# Patient Record
Sex: Female | Born: 1970 | Race: Black or African American | Hispanic: No | Marital: Single | Smoking: Current every day smoker
Health system: Southern US, Community
[De-identification: ages and names within clinical notes are randomized; demographics above are authoritative.]

## PROBLEM LIST (undated history)

## (undated) DIAGNOSIS — F419 Anxiety disorder, unspecified: Secondary | ICD-10-CM

## (undated) DIAGNOSIS — J45909 Unspecified asthma, uncomplicated: Secondary | ICD-10-CM

## (undated) DIAGNOSIS — G510 Bell's palsy: Secondary | ICD-10-CM

## (undated) DIAGNOSIS — F32A Depression, unspecified: Secondary | ICD-10-CM

## (undated) DIAGNOSIS — D573 Sickle-cell trait: Secondary | ICD-10-CM

## (undated) DIAGNOSIS — M1990 Unspecified osteoarthritis, unspecified site: Secondary | ICD-10-CM

## (undated) DIAGNOSIS — G4730 Sleep apnea, unspecified: Secondary | ICD-10-CM

## (undated) DIAGNOSIS — M87029 Idiopathic aseptic necrosis of unspecified humerus: Secondary | ICD-10-CM

## (undated) DIAGNOSIS — F329 Major depressive disorder, single episode, unspecified: Secondary | ICD-10-CM

## (undated) HISTORY — PX: TUBAL LIGATION: SHX77

## (undated) HISTORY — DX: Sleep apnea, unspecified: G47.30

## (undated) HISTORY — PX: OTHER SURGICAL HISTORY: SHX169

---
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
## 2005-12-09 HISTORY — PX: BREAST EXCISIONAL BIOPSY: SUR124

## 2010-09-12 DIAGNOSIS — S82209A Unspecified fracture of shaft of unspecified tibia, initial encounter for closed fracture: Secondary | ICD-10-CM | POA: Insufficient documentation

## 2010-09-18 DIAGNOSIS — S82899A Other fracture of unspecified lower leg, initial encounter for closed fracture: Secondary | ICD-10-CM | POA: Insufficient documentation

## 2012-12-09 HISTORY — PX: BREAST BIOPSY: SHX20

## 2013-08-25 ENCOUNTER — Encounter (HOSPITAL_COMMUNITY): Payer: Self-pay | Admitting: Emergency Medicine

## 2013-08-25 ENCOUNTER — Emergency Department (HOSPITAL_COMMUNITY): Payer: Medicaid Other

## 2013-08-25 ENCOUNTER — Inpatient Hospital Stay (HOSPITAL_COMMUNITY)
Admission: EM | Admit: 2013-08-25 | Discharge: 2013-08-27 | DRG: 313 | Disposition: A | Discharge status: Home / Self Care | Payer: Medicaid Other | Attending: Internal Medicine | Admitting: Internal Medicine

## 2013-08-25 DIAGNOSIS — D649 Anemia, unspecified: Secondary | ICD-10-CM | POA: Diagnosis present

## 2013-08-25 DIAGNOSIS — R942 Abnormal results of pulmonary function studies: Secondary | ICD-10-CM | POA: Diagnosis present

## 2013-08-25 DIAGNOSIS — Z8249 Family history of ischemic heart disease and other diseases of the circulatory system: Secondary | ICD-10-CM

## 2013-08-25 DIAGNOSIS — R9389 Abnormal findings on diagnostic imaging of other specified body structures: Secondary | ICD-10-CM | POA: Diagnosis present

## 2013-08-25 DIAGNOSIS — R079 Chest pain, unspecified: Secondary | ICD-10-CM | POA: Diagnosis present

## 2013-08-25 DIAGNOSIS — Z7982 Long term (current) use of aspirin: Secondary | ICD-10-CM

## 2013-08-25 DIAGNOSIS — R0789 Other chest pain: Principal | ICD-10-CM | POA: Diagnosis present

## 2013-08-25 DIAGNOSIS — D696 Thrombocytopenia, unspecified: Secondary | ICD-10-CM | POA: Diagnosis present

## 2013-08-25 DIAGNOSIS — F411 Generalized anxiety disorder: Secondary | ICD-10-CM | POA: Diagnosis present

## 2013-08-25 DIAGNOSIS — F32A Depression, unspecified: Secondary | ICD-10-CM | POA: Diagnosis present

## 2013-08-25 DIAGNOSIS — M79609 Pain in unspecified limb: Secondary | ICD-10-CM | POA: Diagnosis present

## 2013-08-25 DIAGNOSIS — F3289 Other specified depressive episodes: Secondary | ICD-10-CM | POA: Diagnosis present

## 2013-08-25 DIAGNOSIS — I517 Cardiomegaly: Secondary | ICD-10-CM | POA: Diagnosis present

## 2013-08-25 DIAGNOSIS — F17200 Nicotine dependence, unspecified, uncomplicated: Secondary | ICD-10-CM | POA: Diagnosis present

## 2013-08-25 DIAGNOSIS — E876 Hypokalemia: Secondary | ICD-10-CM | POA: Diagnosis present

## 2013-08-25 DIAGNOSIS — F329 Major depressive disorder, single episode, unspecified: Secondary | ICD-10-CM

## 2013-08-25 DIAGNOSIS — M79661 Pain in right lower leg: Secondary | ICD-10-CM

## 2013-08-25 DIAGNOSIS — D573 Sickle-cell trait: Secondary | ICD-10-CM | POA: Diagnosis present

## 2013-08-25 HISTORY — DX: Anxiety disorder, unspecified: F41.9

## 2013-08-25 HISTORY — DX: Major depressive disorder, single episode, unspecified: F32.9

## 2013-08-25 HISTORY — DX: Depression, unspecified: F32.A

## 2013-08-25 LAB — POCT I-STAT TROPONIN I: Troponin i, poc: 0 ng/mL (ref 0.00–0.08)

## 2013-08-25 MED ORDER — MORPHINE SULFATE 4 MG/ML IJ SOLN
4.0000 mg | Freq: Once | INTRAMUSCULAR | Status: AC
  Administered 2013-08-25: 4 mg via INTRAVENOUS
  Filled 2013-08-25: qty 1

## 2013-08-25 MED ORDER — IOHEXOL 350 MG/ML SOLN
100.0000 mL | Freq: Once | INTRAVENOUS | Status: AC | PRN
  Administered 2013-08-25: 100 mL via INTRAVENOUS

## 2013-08-25 NOTE — ED Notes (Signed)
Pt states she has sharp aching pain in her left chest that she describes originating in her left breasts.

## 2013-08-25 NOTE — ED Notes (Signed)
Per EMS pt states she is experiencing chest pain since 1500 this afternoon, pt denies any other sx except for chest pain. Pt states the chest pain has progressively increased since it first started this afternoon, EMS states pt was in NSR. EMS administered 0.4 mg of nitroglycerin, pt states her pain decreased from a 10 to an 8 with the one nitroglycerin tablet, per EMS they did not give any more nitroglycerin because the pt's BP dropped to 100/70. Per EMS pt took 325mg  of aspirin at home. Per EMS pt denies SOB, N/V, back pain, sweating.

## 2013-08-25 NOTE — ED Provider Notes (Signed)
CSN: [id number]     Arrival date & time 08/25/13  2237 History   First MD Initiated Contact with Patient 08/25/13 2300     Chief Complaint  Patient presents with  . Chest Pain   (Consider location/radiation/quality/duration/timing/severity/associated sxs/prior Treatment) Patient is a 42 y.o. female presenting with chest pain. The history is provided by the patient.  Chest Pain She had onset about 2:30 PM of pain in her midsternal area and left side of her chest. Pain was dull and achy and became a pressure feeling without radiation. There is associated dyspnea but no nausea or diaphoresis. Pain became severe and she rated it at 10/10 at its worst. She was brought in by EMS who gave her a nitroglycerin which reduced the pain to 8/10 but dropped her blood pressure. Pain is not worse with deep breath but it is worse with movement of her left arm and worse if she lays down-especially if she lays on her left side but denies any recent trauma. Denies any cough or fever. She has been having pain in her right calf for the last 4 days. She saw her physician was evaluating her for possible blood clot. She had blood work done but she does not know the results of the time. She's not had any recent long distance travel, no recent surgery. She's not on birth control pills. She does smoke. There is family history of premature coronary artery disease. She has no history of diabetes, hypertension, hyperlipidemia. She does have sickle cell trait.   Past Medical History  Diagnosis Date  . Depression   . Anxiety    Past Surgical History  Procedure Laterality Date  . Right ankle surgery      2010  . Tubal ligation    . Benign breast tumor removed      2006   No family history on file. History  Substance Use Topics  . Smoking status: Current Every Day Smoker -- 0.50 packs/day    Types: Cigarettes  . Smokeless tobacco: Not on file  . Alcohol Use: No   OB History   Grav Para Term Preterm Abortions TAB SAB  Ect Mult Living                 Review of Systems  Cardiovascular: Positive for chest pain.  All other systems reviewed and are negative.    Allergies  Review of patient's allergies indicates no known allergies.  Home Medications   Current Outpatient Rx  Name  Route  Sig  Dispense  Refill  . aspirin EC 81 MG tablet   Oral   Take 81 mg by mouth daily.         . diphenhydrAMINE (BENADRYL) 25 MG tablet   Oral   Take 25 mg by mouth every 8 (eight) hours as needed for sleep.         . Pseudoeph-Doxylamine-DM-APAP (NYQUIL PO)   Oral   Take 15 mLs by mouth at bedtime as needed (for sleep).          BP 110/75  Pulse 90  Temp(Src) 97.7 F (36.5 C) (Oral)  Resp 21  SpO2 99% Physical Exam  Nursing note and vitals reviewed.  42 year old female, resting comfortably and in no acute distress. Vital signs are significant for mild tachypnea with respiratory rate of 21 . Oxygen saturation is 99%, which is normal. Head is normocephalic and atraumatic. PERRLA, EOMI. Oropharynx is clear. Neck is nontender and supple without adenopathy or JVD. Back  is nontender and there is no CVA tenderness. Lungs are clear without rales, wheezes, or rhonchi. Chest is  mildly to moderately tender in the left parasternal area . Heart has regular rate and rhythm without murmur. Abdomen is soft, flat, nontender without masses or hepatosplenomegaly and peristalsis is normoactive. Extremities have no cyanosis or edema, full range of motion is present. there is marked tenderness of the right calf but no calf swelling. Calf circumference is symmetric. There are no palpable cords and no warmth.  Skin is warm and dry without rash. Neurologic: Mental status is normal, cranial nerves are intact, there are no motor or sensory deficits.  ED Course  Procedures (including critical care time) Labs Review Results for orders placed during the hospital encounter of 08/25/13  CBC      Result Value Range   WBC  10.5  4.0 - 10.5 K/uL   RBC 3.82 (*) 3.87 - 5.11 MIL/uL   Hemoglobin 11.0 (*) 12.0 - 15.0 g/dL   HCT 13.2 (*) 44.0 - 10.2 %   MCV 78.0  78.0 - 100.0 fL   MCH 28.8  26.0 - 34.0 pg   MCHC 36.9 (*) 30.0 - 36.0 g/dL   RDW 72.5  36.6 - 44.0 %   Platelets 103 (*) 150 - 400 K/uL  BASIC METABOLIC PANEL      Result Value Range   Sodium 138  135 - 145 mEq/L   Potassium 3.8  3.5 - 5.1 mEq/L   Chloride 104  96 - 112 mEq/L   CO2 27  19 - 32 mEq/L   Glucose, Bld 94  70 - 99 mg/dL   BUN 8  6 - 23 mg/dL   Creatinine, Ser 3.47  0.50 - 1.10 mg/dL   Calcium 8.4  8.4 - 42.5 mg/dL   GFR calc non Af Amer >90  >90 mL/min   GFR calc Af Amer >90  >90 mL/min  POCT I-STAT TROPONIN I      Result Value Range   Troponin i, poc 0.00  0.00 - 0.08 ng/mL   Comment 3            Imaging Review Ct Angio Chest Pe W/cm &/or Wo Cm  08/26/2013   CLINICAL DATA:  Left-sided chest pain.  EXAM: CT ANGIOGRAPHY CHEST WITH CONTRAST  TECHNIQUE: Multidetector CT imaging of the chest was performed using the standard protocol during bolus administration of intravenous contrast. Multiplanar CT image reconstructions including MIPs were obtained to evaluate the vascular anatomy.  CONTRAST:  OMNIPAQUE IOHEXOL 350 MG/ML SOLN  COMPARISON:  No priors.  FINDINGS: Mediastinum: There are no filling defects within the pulmonary arterial tree to suggest underlying pulmonary embolism. Heart size is borderline enlarged. There is no significant pericardial fluid, thickening or pericardial calcification. No pathologically enlarged mediastinal lymph nodes. Multiple borderline enlarged and mildly enlarged bilateral hilar lymph nodes are noted, largest of which measure 1 cm on the right and 1.1 cm on the left in short axis. Esophagus is unremarkable in appearance.  Lungs/Pleura: Diffuse ground-glass attenuation throughout the lower lobes of the lungs bilaterally is nonspecific, but can be seen in the setting of alveolar hemorrhage or developing  infection. There are linear areas of architectural distortion in the lower lobes of the lungs bilaterally, which may represent some post infectious or inflammatory scarring. Multiple small thin-walled cysts are noted scattered throughout the lungs bilaterally, which are nonspecific. Trace left pleural effusion layering dependently. No definite suspicious appearing pulmonary nodule or masses identified at  this time.  Upper Abdomen: Numerous calcified gallstones within the gallbladder. Gallbladder is incompletely visualized.  Musculoskeletal: There are no aggressive appearing lytic or blastic lesions noted in the visualized portions of the skeleton.  Review of the MIP images confirms the above findings.  IMPRESSION: 1. No evidence of pulmonary embolism. 2. Unusual appearance of the lower lobes of the lungs bilaterally where there is mild diffuse ground-glass attenuation. This can be seen in the setting of alveolar hemorrhage (only likely if the patient has a recent history of hemoptysis) or developing infection. In addition, there is a trace left pleural effusion and there are areas of scarring in the lung bases bilaterally. Notably, there is also some borderline enlarged and minimally enlarged bilateral hilar lymph nodes which are presumably reactive. 3. Mild cardiomegaly.   Electronically Signed   By: Trudie Reed M.D.   On: 08/26/2013 00:43   Images viewed by me.   Date: 08/25/2013  Rate: 79  Rhythm: normal sinus rhythm  QRS Axis: normal  Intervals: normal  ST/T Wave abnormalities: normal  Conduction Disutrbances:none  Narrative Interpretation: Normal ECG. No prior ECG available for comparison  Old EKG Reviewed: unchanged   dMDM   1. Chest pain   2. Calf pain, right    Chest pain of uncertain cause. Certainly with recent calf pain that he need to consider possibility of pulmonary embolism. She had traveled down here from [place] via train but that was 4 months ago. ECG does not show any  acute changes but she will need serial cardiac markers.  CT scan is reported to show groundglass densities worrisome for infection but she has no clinical signs of infection. There is no evidence of pulmonary embolism. She got complete relief of pain with morphine but had recurrence of pain and is given a second dose of morphine. She will need to be admitted for serial troponins and also to get venous Doppler of her right leg. Case is discussed with Dr. Toniann Fail of [facility] who agrees to admit the patient.  Dione Booze, MD 08/26/13 [phone]

## 2013-08-26 ENCOUNTER — Encounter (HOSPITAL_COMMUNITY): Payer: Self-pay | Admitting: Internal Medicine

## 2013-08-26 DIAGNOSIS — R079 Chest pain, unspecified: Secondary | ICD-10-CM | POA: Diagnosis present

## 2013-08-26 DIAGNOSIS — M79609 Pain in unspecified limb: Secondary | ICD-10-CM

## 2013-08-26 DIAGNOSIS — E876 Hypokalemia: Secondary | ICD-10-CM | POA: Diagnosis present

## 2013-08-26 DIAGNOSIS — I517 Cardiomegaly: Secondary | ICD-10-CM

## 2013-08-26 DIAGNOSIS — R072 Precordial pain: Secondary | ICD-10-CM

## 2013-08-26 DIAGNOSIS — F329 Major depressive disorder, single episode, unspecified: Secondary | ICD-10-CM | POA: Diagnosis present

## 2013-08-26 DIAGNOSIS — F32A Depression, unspecified: Secondary | ICD-10-CM | POA: Diagnosis present

## 2013-08-26 DIAGNOSIS — R9389 Abnormal findings on diagnostic imaging of other specified body structures: Secondary | ICD-10-CM | POA: Diagnosis present

## 2013-08-26 DIAGNOSIS — D649 Anemia, unspecified: Secondary | ICD-10-CM

## 2013-08-26 DIAGNOSIS — D696 Thrombocytopenia, unspecified: Secondary | ICD-10-CM | POA: Diagnosis present

## 2013-08-26 DIAGNOSIS — D573 Sickle-cell trait: Secondary | ICD-10-CM

## 2013-08-26 LAB — IRON AND TIBC: UIBC: 162 ug/dL (ref 125–400)

## 2013-08-26 LAB — CBC
HCT: 27.8 % — ABNORMAL LOW (ref 36.0–46.0)
Hemoglobin: 10.3 g/dL — ABNORMAL LOW (ref 12.0–15.0)
MCH: 29 pg (ref 26.0–34.0)
MCHC: 36.9 g/dL — ABNORMAL HIGH (ref 30.0–36.0)
MCHC: 37.1 g/dL — ABNORMAL HIGH (ref 30.0–36.0)
Platelets: 103 10*3/uL — ABNORMAL LOW (ref 150–400)
RBC: 3.55 MIL/uL — ABNORMAL LOW (ref 3.87–5.11)
RDW: 15 % (ref 11.5–15.5)
WBC: 10.5 10*3/uL (ref 4.0–10.5)

## 2013-08-26 LAB — T4, FREE: Free T4: 1.21 ng/dL (ref 0.80–1.80)

## 2013-08-26 LAB — BASIC METABOLIC PANEL
BUN: 7 mg/dL (ref 6–23)
BUN: 8 mg/dL (ref 6–23)
Chloride: 102 mEq/L (ref 96–112)
Chloride: 104 mEq/L (ref 96–112)
GFR calc Af Amer: >90 mL/min (ref >90)
GFR calc non Af Amer: >90 mL/min (ref >90)
GFR calc non Af Amer: >90 mL/min (ref >90)
Glucose, Bld: 97 mg/dL (ref 70–99)
Potassium: 3.3 mEq/L — ABNORMAL LOW (ref 3.5–5.1)
Potassium: 3.8 mEq/L (ref 3.5–5.1)
Sodium: 135 mEq/L (ref 135–145)
Sodium: 138 mEq/L (ref 135–145)

## 2013-08-26 LAB — TROPONIN I
Troponin I: <0.3 ng/mL (ref <0.30)
Troponin I: <0.3 ng/mL (ref <0.30)
Troponin I: <0.3 ng/mL (ref <0.30)

## 2013-08-26 LAB — CBC WITH DIFFERENTIAL/PLATELET
Basophils Absolute: 0 10*3/uL (ref 0.0–0.1)
Eosinophils Relative: 1 % (ref 0–5)
Lymphocytes Relative: 10 % — ABNORMAL LOW (ref 12–46)
Neutro Abs: 7.2 10*3/uL (ref 1.7–7.7)
Platelets: 80 10*3/uL — ABNORMAL LOW (ref 150–400)
RDW: 14.9 % (ref 11.5–15.5)
WBC: 8.4 10*3/uL (ref 4.0–10.5)

## 2013-08-26 LAB — HEPATIC FUNCTION PANEL
Alkaline Phosphatase: 66 U/L (ref 39–117)
Indirect Bilirubin: 0.7 mg/dL (ref 0.3–0.9)
Total Bilirubin: 0.8 mg/dL (ref 0.3–1.2)

## 2013-08-26 LAB — SAVE SMEAR

## 2013-08-26 LAB — RETICULOCYTES
RBC.: 3.54 MIL/uL — ABNORMAL LOW (ref 3.87–5.11)
Retic Count, Absolute: 120.4 10*3/uL (ref 19.0–186.0)

## 2013-08-26 LAB — PRO B NATRIURETIC PEPTIDE: Pro B Natriuretic peptide (BNP): 148.6 pg/mL — ABNORMAL HIGH (ref 0–125)

## 2013-08-26 LAB — RAPID URINE DRUG SCREEN, HOSP PERFORMED
Amphetamines: NOT DETECTED
Opiates: NOT DETECTED

## 2013-08-26 LAB — POCT PREGNANCY, URINE: Preg Test, Ur: NEGATIVE

## 2013-08-26 LAB — FERRITIN: Ferritin: 19 ng/mL (ref 10–291)

## 2013-08-26 LAB — PROCALCITONIN: Procalcitonin: <0.1 ng/mL

## 2013-08-26 LAB — MRSA PCR SCREENING: MRSA by PCR: NEGATIVE

## 2013-08-26 LAB — HIV ANTIBODY (ROUTINE TESTING W REFLEX): HIV: NONREACTIVE

## 2013-08-26 MED ORDER — ONDANSETRON HCL 4 MG/2ML IJ SOLN
4.0000 mg | Freq: Four times a day (QID) | INTRAMUSCULAR | Status: DC | PRN
  Administered 2013-08-26: 4 mg via INTRAVENOUS
  Filled 2013-08-26: qty 2

## 2013-08-26 MED ORDER — MORPHINE SULFATE 4 MG/ML IJ SOLN
4.0000 mg | Freq: Once | INTRAMUSCULAR | Status: AC
  Administered 2013-08-26: 4 mg via INTRAVENOUS
  Filled 2013-08-26: qty 1

## 2013-08-26 MED ORDER — ENOXAPARIN SODIUM 100 MG/ML ~~LOC~~ SOLN
90.0000 mg | Freq: Once | SUBCUTANEOUS | Status: AC
  Administered 2013-08-26: 90 mg via SUBCUTANEOUS
  Filled 2013-08-26: qty 1

## 2013-08-26 MED ORDER — ACETAMINOPHEN 650 MG RE SUPP: 650.0000 mg | Freq: Four times a day (QID) | RECTAL | Status: DC | PRN

## 2013-08-26 MED ORDER — SODIUM CHLORIDE 0.9 % IJ SOLN
3.0000 mL | Freq: Two times a day (BID) | INTRAMUSCULAR | Status: DC
  Administered 2013-08-26: 3 mL via INTRAVENOUS

## 2013-08-26 MED ORDER — LEVOFLOXACIN IN D5W 750 MG/150ML IV SOLN
750.0000 mg | INTRAVENOUS | Status: DC
  Administered 2013-08-26: 750 mg via INTRAVENOUS
  Filled 2013-08-26: qty 150

## 2013-08-26 MED ORDER — ASPIRIN EC 325 MG PO TBEC
325.0000 mg | DELAYED_RELEASE_TABLET | Freq: Every day | ORAL | Status: DC
  Filled 2013-08-26: qty 1

## 2013-08-26 MED ORDER — POTASSIUM CHLORIDE CRYS ER 20 MEQ PO TBCR
40.0000 meq | EXTENDED_RELEASE_TABLET | Freq: Once | ORAL | Status: AC
  Administered 2013-08-26: 40 meq via ORAL

## 2013-08-26 MED ORDER — ENOXAPARIN SODIUM 100 MG/ML ~~LOC~~ SOLN
90.0000 mg | Freq: Two times a day (BID) | SUBCUTANEOUS | Status: DC
  Filled 2013-08-26 (×2): qty 1

## 2013-08-26 MED ORDER — ONDANSETRON HCL 4 MG PO TABS: 4.0000 mg | ORAL_TABLET | Freq: Four times a day (QID) | ORAL | Status: DC | PRN

## 2013-08-26 MED ORDER — MORPHINE SULFATE 2 MG/ML IJ SOLN: 1.0000 mg | INTRAMUSCULAR | Status: DC | PRN

## 2013-08-26 MED ORDER — DIPHENHYDRAMINE HCL 50 MG/ML IJ SOLN
25.0000 mg | Freq: Once | INTRAMUSCULAR | Status: AC
  Administered 2013-08-26: 25 mg via INTRAVENOUS
  Filled 2013-08-26: qty 1

## 2013-08-26 MED ORDER — ACETAMINOPHEN 325 MG PO TABS
650.0000 mg | ORAL_TABLET | Freq: Four times a day (QID) | ORAL | Status: DC | PRN
  Administered 2013-08-26: 650 mg via ORAL
  Filled 2013-08-26: qty 2

## 2013-08-26 MED ORDER — HYDROCORTISONE SOD SUCCINATE 100 MG IJ SOLR
100.0000 mg | Freq: Once | INTRAMUSCULAR | Status: AC
  Administered 2013-08-26: 100 mg via INTRAVENOUS
  Filled 2013-08-26: qty 2

## 2013-08-26 NOTE — Progress Notes (Signed)
  Echocardiogram 2D Echocardiogram has been performed.  Tina Mayo 08/26/2013, 4:22 PM

## 2013-08-26 NOTE — ED Notes (Signed)
Pt c/o nausea. Sign and held order released for zofran.

## 2013-08-26 NOTE — ED Notes (Signed)
Admitting MD at bedside.

## 2013-08-26 NOTE — Progress Notes (Signed)
*  Preliminary Results* Bilateral lower extremity venous duplex completed. Bilateral lower extremities are negative for deep vein thrombosis. There is no evidence of Baker's cyst bilaterally.  08/26/2013  Larae Caison, RVT, RDCS, RDMS  

## 2013-08-26 NOTE — Progress Notes (Signed)
[facility] Progress Note [name] TEAM 1 - Stepdown ICU Team   Tina Mayo ZOX:[id number] DOB: [birth year] DOA: 08/25/2013 PCP: No primary provider on file.  Brief narrative: 42 y.o. female history ongoing tobacco abuse and history of depression presently on no medications presents with complaints of chest pain. Patient experienced some chest pain yesterday afternoon which was self-limiting and resolved without any intervention. Later in the evening patient started developing chest pain again. The pain is left-sided stabbing type nonradiating not pleuritic no associated shortness of breath diaphoresis cough fever chills. Since the pain was persistent patient came to the ER. EKG and cardiac markers were negative. CT angiogram of the chest was done which was negative for PE but shows abnormal features, please see the report. Patient's chest pain improved with more morphine but still persistent. Patient has been admitted for further management. Since patient has been having right calf pain since Monday 4 days ago patient was given a dose of Lovenox for possible DVT and Dopplers have been ordered. Patient states that since Monday she has been having some right calf pain for which she had gone to urgent care center and was instructed Dopplers done which patient has not had. Patient otherwise denies any nausea vomiting abdominal pain diarrhea fever chills headache or focal deficits. Patient has recently moved from [place] 2 months ago   Assessment/Plan: Active Problems:   Chest pain -enzymes/EKG negative -ECHO pending -pain worse with movement and appears more muscular    Thrombocytopenia, unspecified / Anemia, unspecified -platelets 103,000 at admit and have trended down since admit - o/w 80,000 -Since prelim duplex negative will dc Lovenox -LDH and haptoglobin not c/w hemolysis -consulted heme-rec check hepatitis panel -smear pending   RLE swelling/tightness -prelim duplex  negative    Cardiomegaly -ECHO pending    Sickle cell trait    Abnormal CT of the chest -Unusual appearance of the lower lobes of the lungs bilaterally  where there is mild diffuse ground-glass attenuation. This can be  seen in the setting of alveolar hemorrhage (only likely if the  patient has a recent history of hemoptysis) or developing infection.  In addition, there is a trace left pleural effusion and there are  areas of scarring in the lung bases bilaterally. Notably, there is  also some borderline enlarged and minimally enlarged bilateral hilar  lymph nodes which are presumably reactive. -not hypoxic and no sx's except for unexplained intermittent diaphoresis since April -no fever or leukocytosis so doubt infection   Depression  -has been off usual meds for > 6 months- once establishes with PCP can eval need then    Hypokalemia -replete and check BMET in am    DVT prophylaxis: Lovenox DC'd since Dopplers negative for DVT in patient with thrombocytopenia-start SCDs Code Status: Full Family Communication: Patient Disposition Plan/Expected LOS: Remain in step down Isolation: None Nutritional Status: Stable  Consultants: Hematology  Procedures: 2-D echocardiogram pending  Lower extremity venous duplex Luminary negative for DVT  Antibiotics: Levaquin 9/17 >>> 9/18  HPI/Subjective: Patient alert and endorses increasing chest discomfort with repositioning to left side. Also reports tenderness over left anterior chest wall. No shortness of breath. Primarily complains of intermittent diaphoresis symptoms throughout the day and evening. Patient attributes this to possible onset of early menopause. States recently moved down here from [place] and has not had complete physical in quite some time including gynecological or mammogram exams  Objective: Blood pressure 110/55, pulse 80, temperature 98.6 F (37 C), temperature source Oral, resp. rate  19, height 5\' 7"   (1.702 m), weight 85 kg (187 lb 6.3 oz), SpO2 99.00%.  Intake/Output Summary (Last 24 hours) at 08/26/13 1508 Last data filed at 08/26/13 0300  Gross per 24 hour  Intake    240 ml  Output      0 ml  Net    240 ml     Exam: General: No acute respiratory distress Lungs: Clear to auscultation bilaterally without wheezes or crackles, RA Cardiovascular: Regular rate and rhythm without murmur gallop or rub normal S1 and S2, no peripheral edema except for mildly swollen right calf, no JVD Abdomen: Nontender, nondistended, soft, bowel sounds positive, no rebound, no ascites, no appreciable mass Musculoskeletal: No significant cyanosis, clubbing of bilateral lower extremities Neurological: Alert and oriented x 3, moves all extremities x 4 without focal neurological deficits, CN 2-12 intact  Scheduled Meds:  Scheduled Meds: . potassium chloride  40 mEq Oral Once  . sodium chloride  3 mL Intravenous Q12H  . sodium chloride  3 mL Intravenous Q12H   Continuous Infusions:   **Reviewed in detail by the Attending Physicia  Data Reviewed: Basic Metabolic Panel:  Recent Labs Lab 08/25/13 2330 08/26/13 0425  NA 138 135  K 3.8 3.3*  CL 104 102  CO2 27 22  GLUCOSE 94 97  BUN 8 7  CREATININE 0.78 0.75  CALCIUM 8.4 8.5   Liver Function Tests:  Recent Labs Lab 08/26/13 0735  AST 13  ALT 16  ALKPHOS 66  BILITOT 0.8  PROT 6.4  ALBUMIN 3.1*   No results found for this basename: LIPASE, AMYLASE,  in the last 168 hours No results found for this basename: AMMONIA,  in the last 168 hours CBC:  Recent Labs Lab 08/25/13 2330 08/26/13 0425 08/26/13 1120  WBC 10.5 9.8 8.4  NEUTROABS  --   --  7.2  HGB 11.0* 10.3* 10.6*  HCT 29.8* 27.8* 29.0*  MCV 78.0 78.3 78.8  PLT 103* 93* 80*   Cardiac Enzymes:  Recent Labs Lab 08/26/13 0425 08/26/13 1120  TROPONINI <0.30 <0.30   BNP (last 3 results)  Recent Labs  08/26/13 0425  PROBNP 148.6*   CBG: No results found for this  basename: GLUCAP,  in the last 168 hours  Recent Results (from the past 240 hour(s))  MRSA PCR SCREENING     Status: None   Collection Time    08/26/13  3:46 AM      Result Value Range Status   MRSA by PCR NEGATIVE  NEGATIVE Final   Comment:            The GeneXpert MRSA Assay (FDA     approved for NASAL specimens     only), is one component of a     comprehensive MRSA colonization     surveillance program. It is not     intended to diagnose MRSA     infection nor to guide or     monitor treatment for     MRSA infections.     Studies:  Recent x-ray studies have been reviewed in detail by the Attending Physician    Junious Silk, ANP [facility] Office  [phone] Pager [phone]  **If unable to reach the above provider after paging please contact the Flow Manager @ [phone]  On-Call/Text Page:      [email or web address]      password [email or web address]  If 7PM-7AM, please contact night-coverage [email or web address] Password [email or web address] 08/26/2013, 3:08 PM   LOS: 1 day  I have examined the patient, reviewed the chart and modified the above note which I agree with.   Tina Rodocker,MD [phone] 08/26/2013, 4:58 PM

## 2013-08-26 NOTE — Consult Note (Signed)
[facility] Telephone:([phone]) [phone]   Fax:([phone]  INPATIENT CONSULT NOTE  REFERRING PHYSICIAN: No referring provider defined for this encounter.  REASON FOR CONSULTATION:  Thrombocytopenia, moderate  HPI Tina Mayo is a 42 y.o. female history ongoing tobacco abuse, bipolar presented with chest pain on day of admission which she described as "tooth ache in my chest". A few days prior to this admission, she reported that she had a "charlie horse" in my right leg. In the ER, the EKG and cardiac markers were negative. CT angiogram of the chest was done which was negative for PE but demonstrated an unusual appearance of the lower lobes of the lungs bilaterally where there is mild diffuse ground-glass attenuation concerning alveolar hemorrhage or infection. She denies hemoptysis.  Of note, the image also showed scarring in the lung bases bilaterally and some borderline enlarged and minimally enlarged bilateral hilar lymph nodes which are presumably reactive with mild cardiomegaly.  Given the high concern for PE with preceding right calf pain, she was given a dose of Lovenox.  Dopplers were also negative.   Patient recently moved from [place].  She reports using "Goody" powders occasionally for headaches.  She states she was on Risperdal, Seroquel and ablilify until earlier this year.  She also reports intermittent iron use until 2010 when her menstrual periods became lighter in flow.  She describes having "hot flashes" periodically for the past 4 months occasional having night sweats.  She also states she was told that she had the sickle cell trait with an occasional crisis consisting of lower back pain relieved triggered with warm weather.  She denies bleeding episodes during surgery or post-operative.  She has had 4 children without bleeding or clotting history.  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  She denies blood transfusions or recent hospitalizations.  She is not aware of a prior complete blood count or being told she have low platelets.  She denies melena or hematochezia.  She denies gingival bleeding.    Past Medical History  Diagnosis Date  . Depression   . Anxiety     Past Surgical History  Procedure Laterality Date  . Right ankle surgery      2010  . Tubal ligation    . Benign breast tumor removed      2006    Family History  Problem Relation Age of Onset  . Breast cancer Mother   . Diabetes Mellitus II Father     Social History History  Substance Use Topics  . Smoking status: Current Every Day Smoker -- 0.50 packs/day    Types: Cigarettes  . Smokeless tobacco: Not on file  . Alcohol Use: No    Allergies  Allergen Reactions  . Levaquin [Levofloxacin In D5w] Other (See Comments)    Tightening in throat    Current Facility-Administered Medications  Medication Dose Route Frequency Provider Last Rate Last Dose  . acetaminophen (TYLENOL) tablet 650 mg  650 mg Oral Q6H PRN Eduard Clos, MD       Or  . acetaminophen (TYLENOL) suppository 650 mg  650 mg Rectal Q6H PRN Eduard Clos, MD      . morphine 2 MG/ML injection 1 mg  1 mg Intravenous Q4H PRN Eduard Clos, MD      . ondansetron [facility]) tablet 4 mg  4 mg Oral Q6H PRN Meryle Ready  Toniann Fail, MD       Or  . ondansetron [facility]) injection 4 mg  4 mg Intravenous Q6H PRN Eduard Clos, MD   4 mg at 08/26/13 0259  . sodium chloride 0.9 % injection 3 mL  3 mL Intravenous Q12H Eduard Clos, MD      . sodium chloride 0.9 % injection 3 mL  3 mL Intravenous Q12H Eduard Clos, MD   3 mL at 08/26/13 1000    Review of Systems  Constitutional: positive for chills and sweats, negative for anorexia Eyes: negative for icterus Ears, nose, mouth, throat, and face: negative for epistaxis Respiratory: positive for pleurisy/chest pain, negative for  cough and sputum Cardiovascular: negative for palpitations, syncope and tachypnea Gastrointestinal: negative for abdominal pain, constipation, diarrhea and nausea Genitourinary:negative for hematuria Integument/breast: positive for right breast lump removed that was benign Hematologic/lymphatic: negative for bleeding, easy bruising, lymphadenopathy and petechiae Musculoskeletal:negative for arthralgias and neck pain Neurological: negative for gait problems and seizures Behavioral/Psych: positive for anxiety, depression and tobacco use  Physical Exam  ZOX:WRUEA, healthy, no distress, well nourished, well developed, comfortable and cooperative SKIN: no rashes or significant lesions HEAD: Normocephalic, No masses, lesions, tenderness or abnormalities EYES: PERRLA, EOMI, Conjunctiva are pink and non-injected, sclera clear EARS: External ears normal OROPHARYNX:no exudate, no erythema and false top maxillary plate  NECK: no adenopathy LYMPH:  no palpable lymphadenopathy LUNGS: clear to auscultation  HEART: regular rate & rhythm, S1 normal and S2 normal ABDOMEN:abdomen soft, non-tender, normal bowel sounds and no masses or organomegaly EXTREMITIES:no edema, negative findings: unilateral edema  NEURO: alert & oriented x 3 with fluent speech  PERFORMANCE STATUS: ECOG 0  LABORATORY DATA: Lab Results  Component Value Date   WBC 8.4 08/26/2013   HGB 10.6* 08/26/2013   HCT 29.0* 08/26/2013   MCV 78.8 08/26/2013   PLT 80* 08/26/2013   CMP     Component Value Date/Time   NA 135 08/26/2013 0425   K 3.3* 08/26/2013 0425   CL 102 08/26/2013 0425   CO2 22 08/26/2013 0425   GLUCOSE 97 08/26/2013 0425   BUN 7 08/26/2013 0425   CREATININE 0.75 08/26/2013 0425   CALCIUM 8.5 08/26/2013 0425   PROT 6.4 08/26/2013 0735   ALBUMIN 3.1* 08/26/2013 0735   AST 13 08/26/2013 0735   ALT 16 08/26/2013 0735   ALKPHOS 66 08/26/2013 0735   BILITOT 0.8 08/26/2013 0735   GFRNONAA >90 08/26/2013 0425   GFRAA >90  08/26/2013 0425   Iron/TIBC/Ferritin    Component Value Date/Time   IRON 92 08/26/2013 0735   TIBC 254 08/26/2013 0735   FERRITIN 19 08/26/2013 0735   Results for Tina, Mayo (MRN [id number]) as of 08/26/2013 19:30  Ref. Range 08/26/2013 04:25 08/26/2013 11:20  Platelets Latest Range: 150-400 K/uL 93 (L) 80 (L)     Ref. Range 08/26/2013 07:35  RBC. Latest Range: 3.87-5.11 MIL/uL 3.54 (L)  Retic Ct Pct Latest Range: 0.4-3.1 % 3.4 (H)  Retic Count, Manual Latest Range: 19.0-186.0 K/uL 120.4  Haptoglobin Latest Range: 45-215 mg/dL 67    RADIOGRAPHIC STUDIES: Ct Angio Chest Pe W/cm &/or Wo Cm  08/26/2013   CLINICAL DATA:  Left-sided chest pain.  EXAM: CT ANGIOGRAPHY CHEST WITH CONTRAST  TECHNIQUE: Multidetector CT imaging of the chest was performed using the standard protocol during bolus administration of intravenous contrast. Multiplanar CT image reconstructions including MIPs were obtained to evaluate the vascular anatomy.  CONTRAST:  OMNIPAQUE IOHEXOL 350 MG/ML SOLN  COMPARISON:  No priors.  FINDINGS: Mediastinum: There are no filling defects within the pulmonary arterial tree to suggest underlying pulmonary embolism. Heart size is borderline enlarged. There is no significant pericardial fluid, thickening or pericardial calcification. No pathologically enlarged mediastinal lymph nodes. Multiple borderline enlarged and mildly enlarged bilateral hilar lymph nodes are noted, largest of which measure 1 cm on the right and 1.1 cm on the left in short axis. Esophagus is unremarkable in appearance.  Lungs/Pleura: Diffuse ground-glass attenuation throughout the lower lobes of the lungs bilaterally is nonspecific, but can be seen in the setting of alveolar hemorrhage or developing infection. There are linear areas of architectural distortion in the lower lobes of the lungs bilaterally, which may represent some post infectious or inflammatory scarring. Multiple small thin-walled cysts are noted  scattered throughout the lungs bilaterally, which are nonspecific. Trace left pleural effusion layering dependently. No definite suspicious appearing pulmonary nodule or masses identified at this time.  Upper Abdomen: Numerous calcified gallstones within the gallbladder. Gallbladder is incompletely visualized.  Musculoskeletal: There are no aggressive appearing lytic or blastic lesions noted in the visualized portions of the skeleton.  Review of the MIP images confirms the above findings.  IMPRESSION: 1. No evidence of pulmonary embolism. 2. Unusual appearance of the lower lobes of the lungs bilaterally where there is mild diffuse ground-glass attenuation. This can be seen in the setting of alveolar hemorrhage (only likely if the patient has a recent history of hemoptysis) or developing infection. In addition, there is a trace left pleural effusion and there are areas of scarring in the lung bases bilaterally. Notably, there is also some borderline enlarged and minimally enlarged bilateral hilar lymph nodes which are presumably reactive. 3. Mild cardiomegaly.   Electronically Signed   By: Trudie Reed M.D.   On: 08/26/2013 00:43    ASSESSMENT: Asymptomatic thrombocytopenia, moderate  I reviewed the smear and it was without evidence of fragmented red blood cells.  Plts were low without evidence of clumping. Differential diagnosis for thrombocytopenia includes a low grade DIC secondary to infections given ct of chest findings versus ITP or  other systemic or bone marrow disorders.  We do not have a baseline complete blood count to establish chronicity.  HIV is negative. I could not palpate a spleen and she does not have stigmata of liver disease.  She denies alcohol use.    PLAN:  1. Continue to trend plt counts for now with daily CBC.  No role for transfusion presently. 2. Obtain prior hematology w/u or CBC from PCP.   3. Low probability for heparin-induced thrombocytopenia based on a 4-T score of 1  point.  Her platelet count fell less than 30 percent, and less than 4 days without recent heparin exposure, no confirmed thrombosis or skin lesions.   The patient voices understanding of current disease status and treatment options and is in agreement with the current care plan.  All questions were answered. The patient knows to call the clinic with any problems, questions or concerns. We can certainly see the patient much sooner if necessary.  Thank you so much for allowing me to participate in the care of Tina Mayo. I will continue to follow up the patient with you and assist in her care.  I spent 30 minutes counseling the patient face to face. The total time spent in the appointment was 60 minutes.  Burma Ketcher 08/26/2013, 7:28 PM

## 2013-08-26 NOTE — Progress Notes (Addendum)
ANTIBIOTIC/ANTICOAGULANT CONSULT NOTE - INITIAL  Pharmacy Consult for levaquin, lovenox Indication: PNA, r/o DVT  No Known Allergies  Patient Measurements: Height: 5\' 7"  (170.2 cm) Weight: 198 lb (89.812 kg) IBW/kg (Calculated) : 61.6  Vital Signs: Temp: 97.7 F (36.5 C) (09/17 2251) Temp src: Oral (09/17 2251) BP: 96/56 mmHg (09/18 0230) Pulse Rate: 71 (09/18 0230) Intake/Output from previous day:   Intake/Output from this shift:    Labs:  Recent Labs  08/25/13 2330  WBC 10.5  HGB 11.0*  PLT 103*  CREATININE 0.78   Estimated Creatinine Clearance: 104.4 ml/min (by C-G formula based on Cr of 0.78). No results found for this basename: VANCOTROUGH, VANCOPEAK, VANCORANDOM, GENTTROUGH, GENTPEAK, GENTRANDOM, TOBRATROUGH, TOBRAPEAK, TOBRARND, AMIKACINPEAK, AMIKACINTROU, AMIKACIN,  in the last 72 hours   Microbiology: No results found for this or any previous visit (from the past 720 hour(s)).  Medical History: Past Medical History  Diagnosis Date  . Depression   . Anxiety     Medications:  Prescriptions prior to admission  Medication Sig Dispense Refill  . aspirin EC 81 MG tablet Take 81 mg by mouth daily.      . diphenhydrAMINE (BENADRYL) 25 MG tablet Take 25 mg by mouth every 8 (eight) hours as needed for sleep.      . Pseudoeph-Doxylamine-DM-APAP (NYQUIL PO) Take 15 mLs by mouth at bedtime as needed (for sleep).       Assessment: 42 yo lady presents with CP to start levaquin for  R/o PNA.  Her CrCl >100 ml/min.  Her CT was neg for PE and has c/o R calf pain so lovenox will continue until DVT ruled out.  Goal of Therapy:  Eradication of infection Treatment DVT  Plan:  Levaquin 750 mg IV q24 hours. F/u cultures, clinical course and LOT Lovenox 90 mg sq q12 hours. CBC q 3 days while on lovenox, F/u dopplers  Elizah Mierzwa Poteet 08/26/2013,3:49 AM

## 2013-08-26 NOTE — Progress Notes (Signed)
Utilization Review Completed.Tina Mayo T9/18/2014  

## 2013-08-26 NOTE — H&P (Addendum)
[facility] History and Physical  Tina Mayo UJW:[id number] DOB: [birth year] DOA: 08/25/2013  Referring physician: ER physician. PCP: No primary provider on file.   Chief Complaint: Chest pain.  HPI: Tina Mayo is a 42 y.o. female history ongoing tobacco abuse and history of depression presently on no medications presents with complaints of chest pain. Patient experienced some chest pain yesterday afternoon which was self-limiting and resolved without any intervention. Later in the evening patient started developing chest pain again. The pain is left-sided stabbing type nonradiating not pleuritic no associated shortness of breath diaphoresis cough fever chills. Since the pain was persistent patient came to the ER. EKG and cardiac markers were negative. CT angiogram of the chest was done which was negative for PE but shows abnormal features, please see the report. Patient's chest pain improved with more morphine but still persistent. Patient has been admitted for further management. Since patient has been having right calf pain since Monday 4 days ago patient was given a dose of Lovenox for possible DVT and Dopplers have been ordered. Patient states that since Monday she has been having some right calf pain for which she had gone to urgent care center and was instructed Dopplers done which patient has not had. Patient otherwise denies any nausea vomiting abdominal pain diarrhea fever chills headache or focal deficits. Patient has recently moved from [place] 2 months ago.  Review of Systems: As presented in the history of presenting illness, rest negative.  Past Medical History  Diagnosis Date  . Depression   . Anxiety    Past Surgical History  Procedure Laterality Date  . Right ankle surgery      2010  . Tubal ligation    . Benign breast tumor removed      2006   Social History:  reports that she has been smoking Cigarettes.  She has been smoking about 0.50 packs per day.  She does not have any smokeless tobacco history on file. She reports that she does not drink alcohol or use illicit drugs. home.  where does patient live--  can do ADLs.  Can patient participate in ADLs?  No Known Allergies  Family History  Problem Relation Age of Onset  . Breast cancer Mother   . Diabetes Mellitus II Father       Prior to Admission medications   Medication Sig Start Date End Date Taking? Authorizing Provider  aspirin EC 81 MG tablet Take 81 mg by mouth daily.   Yes Historical Provider, MD  diphenhydrAMINE (BENADRYL) 25 MG tablet Take 25 mg by mouth every 8 (eight) hours as needed for sleep.   Yes Historical Provider, MD  Pseudoeph-Doxylamine-DM-APAP (NYQUIL PO) Take 15 mLs by mouth at bedtime as needed (for sleep).   Yes Historical Provider, MD   Physical Exam: Filed Vitals:   08/25/13 2251 08/26/13 0100 08/26/13 0135  BP: 110/75  97/61  Pulse: 90  76  Temp: 97.7 F (36.5 C)    TempSrc: Oral    Resp: 21  10  Height:  5\' 7"  (1.702 m)   Weight:  89.812 kg (198 lb)   SpO2: 99%  100%     General:   well-developed and nourished.   Eyes: Anicteric no pallor.   ENT: No discharge from ears eyes nose mouth.   Neck: No mass felt.   Cardiovascular:  S1-S2 heard.   Respiratory:  no rhonchi or crepitations.   Abdomen:  soft nontender bowel sounds present.   Skin: No rash.  Musculoskeletal:  no edema.   Psychiatric:  appears normal.   Neurologic:  alert awake oriented to time place and person. Moves all extremities.   Labs on Admission:  Basic Metabolic Panel:  Recent Labs Lab 08/25/13 2330  NA 138  K 3.8  CL 104  CO2 27  GLUCOSE 94  BUN 8  CREATININE 0.78  CALCIUM 8.4   Liver Function Tests: No results found for this basename: AST, ALT, ALKPHOS, BILITOT, PROT, ALBUMIN,  in the last 168 hours No results found for this basename: LIPASE, AMYLASE,  in the last 168 hours No results found for this basename: AMMONIA,  in the last 168  hours CBC:  Recent Labs Lab 08/25/13 2330  WBC 10.5  HGB 11.0*  HCT 29.8*  MCV 78.0  PLT 103*   Cardiac Enzymes: No results found for this basename: CKTOTAL, CKMB, CKMBINDEX, TROPONINI,  in the last 168 hours  BNP (last 3 results) No results found for this basename: PROBNP,  in the last 8760 hours CBG: No results found for this basename: GLUCAP,  in the last 168 hours  Radiological Exams on Admission: Ct Angio Chest Pe W/cm &/or Wo Cm  08/26/2013   CLINICAL DATA:  Left-sided chest pain.  EXAM: CT ANGIOGRAPHY CHEST WITH CONTRAST  TECHNIQUE: Multidetector CT imaging of the chest was performed using the standard protocol during bolus administration of intravenous contrast. Multiplanar CT image reconstructions including MIPs were obtained to evaluate the vascular anatomy.  CONTRAST:  OMNIPAQUE IOHEXOL 350 MG/ML SOLN  COMPARISON:  No priors.  FINDINGS: Mediastinum: There are no filling defects within the pulmonary arterial tree to suggest underlying pulmonary embolism. Heart size is borderline enlarged. There is no significant pericardial fluid, thickening or pericardial calcification. No pathologically enlarged mediastinal lymph nodes. Multiple borderline enlarged and mildly enlarged bilateral hilar lymph nodes are noted, largest of which measure 1 cm on the right and 1.1 cm on the left in short axis. Esophagus is unremarkable in appearance.  Lungs/Pleura: Diffuse ground-glass attenuation throughout the lower lobes of the lungs bilaterally is nonspecific, but can be seen in the setting of alveolar hemorrhage or developing infection. There are linear areas of architectural distortion in the lower lobes of the lungs bilaterally, which may represent some post infectious or inflammatory scarring. Multiple small thin-walled cysts are noted scattered throughout the lungs bilaterally, which are nonspecific. Trace left pleural effusion layering dependently. No definite suspicious appearing pulmonary  nodule or masses identified at this time.  Upper Abdomen: Numerous calcified gallstones within the gallbladder. Gallbladder is incompletely visualized.  Musculoskeletal: There are no aggressive appearing lytic or blastic lesions noted in the visualized portions of the skeleton.  Review of the MIP images confirms the above findings.  IMPRESSION: 1. No evidence of pulmonary embolism. 2. Unusual appearance of the lower lobes of the lungs bilaterally where there is mild diffuse ground-glass attenuation. This can be seen in the setting of alveolar hemorrhage (only likely if the patient has a recent history of hemoptysis) or developing infection. In addition, there is a trace left pleural effusion and there are areas of scarring in the lung bases bilaterally. Notably, there is also some borderline enlarged and minimally enlarged bilateral hilar lymph nodes which are presumably reactive. 3. Mild cardiomegaly.   Electronically Signed   By: Trudie Reed M.D.   On: 08/26/2013 00:43    EKG: Independently reviewed.  normal sinus rhythm.   Assessment/Plan Principal Problem:   Chest pain   1. Chest pain  -  appears atypical but given that patient has cardiomegaly and pleural effusion we will cycle cardiac markers and check 2-D echo. Check BNP.  2. Abnormal CT chest - I have discussed with pulmonologist at this time. Differentials include alveolar hemorrhage but patient denies having any hemoptysis. At this time I will place patient on Levaquin and get procalcitonin levels for possible pneumonia. If procalcitonin levels are negative then may DC antibiotics. Other differential includes pulmonary edema for which I have ordered 2-D echo and BNP. If patient's stay in the hospital was unremarkable then may need followup with pulmonologist as outpatient. 3. Right calf muscle pain  - check Dopplers to rule out DVT. Until then patient will be on Lovenox.  4. Thrombocytopenia - closely follow CBC. Given that patient also  has anemia we will check LDH for any thrombosis. Check HIV. 5. Anemia - closely follow CBC. Check LDH and anemia panel. 6. Tobacco abuse - advised to quit smoking. 7. History of depression presently off medication and has no suicidal thoughts.  Addnedum - Patient complained of throat constricting like feeling. On exam patient is not in distress. Has no stridor or difficulty breathing.Denies any itching. Has already received Levaquin. Will stop it and give Hydrocortisone and one dose of benadryl. Further antibiotics to be decided if Procalcitonin is elevated.Pharmacy notified.    Code Status: full code.   Family Communication:  none.   Disposition Plan:  admit for observation.     Tina Mayo N. [facility] Pager [phone].  If 7PM-7AM, please contact night-coverage [email or web address] Password [facility] 08/26/2013, 2:37 AM

## 2013-08-26 NOTE — Care Management Note (Signed)
    Page 1 of 1   08/26/2013     3:01:25 PM   CARE MANAGEMENT NOTE 08/26/2013  Patient:  Tina Mayo, Tina Mayo   Account Number:  [id number]  Date Initiated:  08/26/2013  Documentation initiated by:  Tina Mayo  Subjective/Objective Assessment:   adm w ch pain     Action/Plan:   lives w fam, just moved to [place]. has no pcp. called medicaid [phone] and no pcp assigned. pt will need to find md that takes her medicaid.   Anticipated DC Date:     Anticipated DC Plan:        DC Planning Services  CM consult      Choice offered to / List presented to:             Status of service:   Medicare Important Message given?   (If response is "NO", the following Medicare IM given date fields will be blank) Date Medicare IM given:   Date Additional Medicare IM given:    Discharge Disposition:    Per UR Regulation:  Reviewed for med. necessity/level of care/duration of stay  If discussed at Long Length of Stay Meetings, dates discussed:    Comments:  9/18 1458 Tina Antwuan Eckley rn,bsn spoke w [facility]. to get spec pt needs pcp to ref. since no pcp dss sugg pt get hematology consult in [facility] to get in faster.have alerted pt allison what if found out from dss.

## 2013-08-27 ENCOUNTER — Other Ambulatory Visit: Payer: Self-pay

## 2013-08-27 DIAGNOSIS — D696 Thrombocytopenia, unspecified: Secondary | ICD-10-CM

## 2013-08-27 LAB — BASIC METABOLIC PANEL
CO2: 24 mEq/L (ref 19–32)
Calcium: 8.6 mg/dL (ref 8.4–10.5)
Chloride: 106 mEq/L (ref 96–112)
Glucose, Bld: 86 mg/dL (ref 70–99)
Sodium: 140 mEq/L (ref 135–145)

## 2013-08-27 LAB — CBC
HCT: 27.4 % — ABNORMAL LOW (ref 36.0–46.0)
MCH: 28.7 pg (ref 26.0–34.0)
MCV: 78.7 fL (ref 78.0–100.0)
Platelets: 85 10*3/uL — ABNORMAL LOW (ref 150–400)
RBC: 3.48 MIL/uL — ABNORMAL LOW (ref 3.87–5.11)

## 2013-08-27 LAB — HEPATITIS PANEL, ACUTE: Hepatitis B Surface Ag: NEGATIVE

## 2013-08-27 NOTE — Discharge Summary (Signed)
Physician Discharge Summary  Tina Mayo ZOX:[id number] DOB: 1971/03/13 DOA: 08/25/2013  PCP: No primary provider on file.  Admit date: 08/25/2013 Discharge date: 08/27/2013  Time spent: >30 minutes  Recommendations for Outpatient Follow-up:  1. Call the number on the Medicaid card to establish with a PCP 2. Call Dr. Benjiman Core office to schedule hospital follow up in 2-4 weeks regarding the thrombocytopenia  Discharge Diagnoses:    Chest pain- non cardiac/musculoskeletal   Thrombocytopenia, unspecified   Cardiomegaly   Sickle cell trait   Abnormal CT of the chest   Depression   Hypokalemia   Anemia, unspecified   Discharge Condition: stable  Diet recommendation: Regular  Filed Weights   08/26/13 0100 08/26/13 0354  Weight: 89.812 kg (198 lb) 85 kg (187 lb 6.3 oz)    History of present illness:  42 y.o. female history ongoing tobacco abuse and history of depression presently on no medications presents with complaints of chest pain. Patient experienced some chest pain 24 hours prior to admission which was self-limiting and resolved without any intervention. Later in the evening the patient started developing chest pain again. The pain was left-sided with a stabbing type quality; nonradiating and not pleuritic- no associated shortness of breath, diaphoresis, cough, fever, or chills. Since the pain was persistent patient came to the ER. EKG and cardiac markers were negative. CT angiogram of the chest was done which was negative for PE but showed abnormal features (please see the CTA report). The patient's chest pain improved after additional morphine but had not resolved. She also had endorsed right calf pain since Monday 4 days prior. She was given a dose of Lovenox for possible DVT and Dopplers were ordered. Patient had recently moved from [place] 2 months ago   Hospital Course:   Chest pain  -enzymes/EKG negative  -ECHO normal  -pain worse with movement and appears more  muscular in etiology  Thrombocytopenia, unspecified / Anemia, unspecified  -platelets 103,000 at admit and trended down since admit - nadir was 80,000 - up to 85,000 on day of d/c  -LDH and haptoglobin not c/w hemolysis  -per Hematology: low probability for HIT -smear pending for review by Hematology - pt is to call and schedule OP follow up  -as precaution pre admission baby ASA dc'd  RLE swelling/tightness  -Venous duplex negative for DVT  Cardiomegaly  -ECHO normal systolic function, no cardiomegaly and no DD   Sickle cell trait   Abnormal CT of the chest  -Unusual appearance of the lower lobes of the lungs bilaterally where there is mild diffuse ground-glass attenuation. This can be seen in the setting of alveolar hemorrhage (only likely if the patient has a recent history of hemoptysis) or developing infection. In addition, there is a trace left pleural effusion and there are areas of scarring in the lung bases bilaterally. Notably, there is also some borderline enlarged and minimally enlarged bilateral hilar lymph nodes which are presumably reactive.  -not hypoxic and no sx's except for unexplained intermittent diaphoresis since April  -no fever or leukocytosis so doubt infection  -at this time this is a non specific finding - outpt f/u suggested to pt   Depression  -has been off usual meds for > 6 months- once establishes with PCP can eval need then   Hypokalemia  -replete and check BMET in am   Procedures: Bilateral Lower Extremity Venous Duplex - No evidence of deep vein thrombosis involving the right lower extremity and left lower extremity. - No evidence of  Baker's cyst on the right or left.  Transthoracic Echocardiography - Left ventricle: The cavity size was normal. Wall thickness was normal. Systolic function was normal. The estimated ejection fraction was in the range of 60% to 65%. Wall motion was normal; there were no regional wall motion abnormalities. Left  ventricular diastolic function parameters were normal. - Atrial septum: No defect or patent foramen ovale was identified. Impressions: - Normal study.  Consultations:  Hematology  Discharge Exam: Filed Vitals:   08/27/13 0343  BP: 116/71  Pulse:   Temp: 98.1 F (36.7 C)  Resp: 17   General: No acute respiratory distress  Lungs: Clear to auscultation bilaterally without wheezes or crackles, RA  Cardiovascular: Regular rate and rhythm without murmur gallop or rub normal S1 and S2, no peripheral edema except for mildly swollen right calf, no JVD  Abdomen: Nontender, nondistended, soft, bowel sounds positive, no rebound, no ascites, no appreciable mass  Musculoskeletal: No significant cyanosis, clubbing of bilateral lower extremities  Neurological: Alert and oriented x 3, moves all extremities x 4 without focal neurological deficits, CN 2-12 intact   Discharge Instructions      Discharge Orders   Future Orders Complete By Expires   Call MD for:  difficulty breathing, headache or visual disturbances  As directed    Call MD for:  extreme fatigue  As directed    Call MD for:  persistant dizziness or light-headedness  As directed    Call MD for:  temperature >100.4  As directed    Call MD for:  As directed    Scheduling Instructions:     Any unusual bleeding or bruising   Diet general  As directed    Increase activity slowly  As directed        Medication List    STOP taking these medications       aspirin EC 81 MG tablet      TAKE these medications       diphenhydrAMINE 25 MG tablet  Commonly known as:  BENADRYL  Take 25 mg by mouth every 8 (eight) hours as needed for sleep.     NYQUIL PO  Take 15 mLs by mouth at bedtime as needed (for sleep).       Allergies  Allergen Reactions  . Levaquin [Levofloxacin In D5w] Other (See Comments)    Tightening in throat   Follow-up Information   Follow up with CHISM, DAVID, MD. (call to be seen in 2-4 weeks for  hospital follow up regarding the low platelet count)    Specialty:  Internal Medicine   Contact information:   [address] [place] [address] [phone]       Please follow up. (Call the number on your Medicaid card so you can locate a regular medical doctor)       The results of significant diagnostics from this hospitalization (including imaging, microbiology, ancillary and laboratory) are listed below for reference.    Significant Diagnostic Studies: Ct Angio Chest Pe W/cm &/or Wo Cm  08/26/2013   CLINICAL DATA:  Left-sided chest pain.  EXAM: CT ANGIOGRAPHY CHEST WITH CONTRAST  TECHNIQUE: Multidetector CT imaging of the chest was performed using the standard protocol during bolus administration of intravenous contrast. Multiplanar CT image reconstructions including MIPs were obtained to evaluate the vascular anatomy.  CONTRAST:  OMNIPAQUE IOHEXOL 350 MG/ML SOLN  COMPARISON:  No priors.  FINDINGS: Mediastinum: There are no filling defects within the pulmonary arterial tree to suggest underlying pulmonary  embolism. Heart size is borderline enlarged. There is no significant pericardial fluid, thickening or pericardial calcification. No pathologically enlarged mediastinal lymph nodes. Multiple borderline enlarged and mildly enlarged bilateral hilar lymph nodes are noted, largest of which measure 1 cm on the right and 1.1 cm on the left in short axis. Esophagus is unremarkable in appearance.  Lungs/Pleura: Diffuse ground-glass attenuation throughout the lower lobes of the lungs bilaterally is nonspecific, but can be seen in the setting of alveolar hemorrhage or developing infection. There are linear areas of architectural distortion in the lower lobes of the lungs bilaterally, which may represent some post infectious or inflammatory scarring. Multiple small thin-walled cysts are noted scattered throughout the lungs bilaterally, which are nonspecific. Trace left pleural effusion layering  dependently. No definite suspicious appearing pulmonary nodule or masses identified at this time.  Upper Abdomen: Numerous calcified gallstones within the gallbladder. Gallbladder is incompletely visualized.  Musculoskeletal: There are no aggressive appearing lytic or blastic lesions noted in the visualized portions of the skeleton.  Review of the MIP images confirms the above findings.  IMPRESSION: 1. No evidence of pulmonary embolism. 2. Unusual appearance of the lower lobes of the lungs bilaterally where there is mild diffuse ground-glass attenuation. This can be seen in the setting of alveolar hemorrhage (only likely if the patient has a recent history of hemoptysis) or developing infection. In addition, there is a trace left pleural effusion and there are areas of scarring in the lung bases bilaterally. Notably, there is also some borderline enlarged and minimally enlarged bilateral hilar lymph nodes which are presumably reactive. 3. Mild cardiomegaly.   Electronically Signed   By: Trudie Reed M.D.   On: 08/26/2013 00:43    Microbiology: Recent Results (from the past 240 hour(s))  MRSA PCR SCREENING     Status: None   Collection Time    08/26/13  3:46 AM      Result Value Range Status   MRSA by PCR NEGATIVE  NEGATIVE Final   Comment:            The GeneXpert MRSA Assay (FDA     approved for NASAL specimens     only), is one component of a     comprehensive MRSA colonization     surveillance program. It is not     intended to diagnose MRSA     infection nor to guide or     monitor treatment for     MRSA infections.     Labs: Basic Metabolic Panel:  Recent Labs Lab 08/25/13 2330 08/26/13 0425 08/27/13 0503  NA 138 135 140  K 3.8 3.3* 3.5  CL 104 102 106  CO2 27 22 24   GLUCOSE 94 97 86  BUN 8 7 7   CREATININE 0.78 0.75 0.76  CALCIUM 8.4 8.5 8.6   Liver Function Tests:  Recent Labs Lab 08/26/13 0735  AST 13  ALT 16  ALKPHOS 66  BILITOT 0.8  PROT 6.4  ALBUMIN 3.1*    CBC:  Recent Labs Lab 08/25/13 2330 08/26/13 0425 08/26/13 1120 08/27/13 0503  WBC 10.5 9.8 8.4 8.3  NEUTROABS  --   --  7.2  --   HGB 11.0* 10.3* 10.6* 10.0*  HCT 29.8* 27.8* 29.0* 27.4*  MCV 78.0 78.3 78.8 78.7  PLT 103* 93* 80* 85*   Cardiac Enzymes:  Recent Labs Lab 08/26/13 0425 08/26/13 1120 08/26/13 1614  TROPONINI <0.30 <0.30 <0.30   BNP: BNP (last 3 results)  Recent Labs  08/26/13  0425  PROBNP 148.6*   CBG:  Recent Labs Lab 08/27/13 0821  GLUCAP 85   Signed:  ELLIS,ALLISON L. ANP  [facility] 08/27/2013, 10:58 AM   I have personally examined this patient and reviewed the entire database. I have reviewed the above note, made any necessary editorial changes, and agree with its content.  Lonia Blood, MD [facility]

## 2013-08-30 ENCOUNTER — Telehealth: Payer: Self-pay | Admitting: Internal Medicine

## 2013-08-30 NOTE — Telephone Encounter (Signed)
C/D 08/30/13 for appt. 10/14 °

## 2013-09-21 ENCOUNTER — Telehealth: Payer: Self-pay | Admitting: Internal Medicine

## 2013-09-21 ENCOUNTER — Other Ambulatory Visit: Payer: Self-pay | Admitting: Internal Medicine

## 2013-09-21 ENCOUNTER — Ambulatory Visit: Payer: Medicaid Other

## 2013-09-21 ENCOUNTER — Ambulatory Visit (HOSPITAL_BASED_OUTPATIENT_CLINIC_OR_DEPARTMENT_OTHER): Payer: Medicaid Other | Admitting: Internal Medicine

## 2013-09-21 ENCOUNTER — Encounter: Payer: Self-pay | Admitting: Internal Medicine

## 2013-09-21 ENCOUNTER — Other Ambulatory Visit (HOSPITAL_BASED_OUTPATIENT_CLINIC_OR_DEPARTMENT_OTHER): Payer: Medicaid Other | Admitting: Lab

## 2013-09-21 VITALS — BP 109/65 | HR 97 | Temp 98.6°F | Resp 18 | Ht 67.0 in | Wt 192.4 lb

## 2013-09-21 DIAGNOSIS — F17200 Nicotine dependence, unspecified, uncomplicated: Secondary | ICD-10-CM

## 2013-09-21 DIAGNOSIS — D696 Thrombocytopenia, unspecified: Secondary | ICD-10-CM

## 2013-09-21 DIAGNOSIS — D649 Anemia, unspecified: Secondary | ICD-10-CM

## 2013-09-21 DIAGNOSIS — D573 Sickle-cell trait: Secondary | ICD-10-CM

## 2013-09-21 DIAGNOSIS — R9389 Abnormal findings on diagnostic imaging of other specified body structures: Secondary | ICD-10-CM

## 2013-09-21 DIAGNOSIS — Z720 Tobacco use: Secondary | ICD-10-CM

## 2013-09-21 LAB — CBC WITH DIFFERENTIAL/PLATELET
Basophils Absolute: 0 10*3/uL (ref 0.0–0.1)
EOS%: 2.5 % (ref 0.0–7.0)
Eosinophils Absolute: 0.2 10*3/uL (ref 0.0–0.5)
HGB: 11.1 g/dL — ABNORMAL LOW (ref 11.6–15.9)
LYMPH%: 22.7 % (ref 14.0–49.7)
MCH: 28.6 pg (ref 25.1–34.0)
MCV: 82.6 fL (ref 79.5–101.0)
MONO%: 8 % (ref 0.0–14.0)
NEUT#: 5.4 10*3/uL (ref 1.5–6.5)
NEUT%: 66.3 % (ref 38.4–76.8)
Platelets: 99 10*3/uL — ABNORMAL LOW (ref 145–400)

## 2013-09-21 LAB — COMPREHENSIVE METABOLIC PANEL (CC13)
AST: 15 U/L (ref 5–34)
Albumin: 3.3 g/dL — ABNORMAL LOW (ref 3.5–5.0)
Alkaline Phosphatase: 76 U/L (ref 40–150)
BUN: 4.7 mg/dL — ABNORMAL LOW (ref 7.0–26.0)
Creatinine: 0.8 mg/dL (ref 0.6–1.1)
Glucose: 73 mg/dl (ref 70–140)
Total Bilirubin: 0.84 mg/dL (ref 0.20–1.20)

## 2013-09-21 MED ORDER — FERROUS SULFATE 325 (65 FE) MG PO TBEC: 325.0000 mg | DELAYED_RELEASE_TABLET | Freq: Every day | ORAL | Status: DC

## 2013-09-21 NOTE — Progress Notes (Signed)
[facility] OFFICE PROGRESS NOTE  No primary provider on file. No primary provider on file.  DIAGNOSIS: Thrombocytopenia, unspecified - Plan: CBC with Differential, Ferritin, Iron and TIBC, CBC with Differential, Iron and TIBC, Ferritin, Comprehensive metabolic panel, US Abdomen Complete  Tobacco abuse  Anemia, unspecified - Plan: CBC with Differential, Ferritin, Iron and TIBC, CBC with Differential, Iron and TIBC, Ferritin, Comprehensive metabolic panel, ferrous sulfate 325 (65 FE) MG EC tablet  Chief Complaint  Patient presents with  . Thrombocytopenia  . Mild anemia    CURRENT THERAPY: Observation.  INTERVAL HISTORY: Tina Mayo 42 y.o. female with a history ongoing tobacco abuse, bipolar presented with chest pain on day of admission on 09/17 which she described as "tooth ache in my chest". A few days prior to this admission, she reported that she had a "charlie horse" in my right leg. In the ER, the EKG and cardiac markers were negative. CT angiogram of the chest was done which was negative for PE but demonstrated an unusual appearance of the lower lobes of the lungs bilaterally where there is mild diffuse ground-glass attenuation concerning alveolar hemorrhage or infection. She denied hemoptysis. Of note, the image also showed scarring in the lung bases bilaterally and some borderline enlarged and minimally enlarged bilateral hilar lymph nodes which are presumably reactive with mild cardiomegaly. Given the high concern for PE with preceding right calf pain, she was given a dose of Lovenox. Dopplers were also negative.   Hematology was consulted and we felt she had low probability for heparin induced thrombocytopenia.    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 She denies blood transfusions or recent hospitalizations. She is not aware of a prior complete blood count or being told  she have low platelets. She denies melena or hematochezia. She denies gingival bleeding.  She was instructed follow-up with our offices due to her low platelets upon discharge.    MEDICAL HISTORY: Past Medical History  Diagnosis Date  . Depression   . Anxiety     INTERIM HISTORY: has Chest pain; Thrombocytopenia, unspecified; Cardiomegaly; Sickle cell trait; Abnormal CT of the chest; Depression; Hypokalemia; Anemia, unspecified; and Tobacco abuse on her problem list.    ALLERGIES:  is allergic to levaquin.  MEDICATIONS: has a current medication list which includes the following prescription(s): diphenhydramine, pseudoeph-doxylamine-dm-apap, and ferrous sulfate.  SURGICAL HISTORY:  Past Surgical History  Procedure Laterality Date  . Right ankle surgery      2010  . Tubal ligation    . Benign breast tumor removed      2006    REVIEW OF SYSTEMS:   Constitutional: Denies fevers, chills or abnormal weight loss Eyes: Denies blurriness of vision Ears, nose, mouth, throat, and face: Denies mucositis or sore throat Respiratory: Denies cough, dyspnea or wheezes Cardiovascular: Denies palpitation, chest discomfort or lower extremity swelling Gastrointestinal:  Denies nausea, heartburn or change in bowel habits Skin: Denies abnormal skin rashes;positive for right breast lump removed that was benign Hematologic/lymphatic: negative for bleeding, easy bruising, lymphadenopathy and petechiae Neurological:Denies numbness, tingling or new weaknesses Behavioral/Psych: Mood is stable, no new changes  All other systems were reviewed with the patient and are negative.  PHYSICAL EXAMINATION: ECOG PERFORMANCE STATUS: 0 - Asymptomatic  Blood pressure 109/65, pulse 97, temperature 98.6 F (37 C), temperature source Oral, resp. rate 18, height 5\' 7"  (1.702 m), weight 192 lb 6.4 oz (87.272  kg).  GENERAL:alert, no distress and comfortable SKIN: skin color, texture, turgor are normal, no rashes or  significant lesions EYES: normal, Conjunctiva are pink and non-injected, sclera clear OROPHARYNX:no exudate, no erythema and lips, buccal mucosa, and tongue normal  NECK: supple, thyroid normal size, non-tender, without nodularity LYMPH:  no palpable lymphadenopathy in the cervical, axillary or supraclavicular LUNGS: clear to auscultation and percussion with normal breathing effort HEART: regular rate & rhythm and no murmurs and no lower extremity edema ABDOMEN:abdomen soft, non-tender and normal bowel sounds; No organomegaly.  Musculoskeletal:no cyanosis of digits and no clubbing NEURO: alert & oriented x 3 with fluent speech, no focal motor/sensory deficits  LABORATORY DATA: Results for orders placed in visit on 09/21/13 (from the past 48 hour(s))  CBC WITH DIFFERENTIAL     Status: Abnormal   Collection Time    09/21/13 11:17 AM      Result Value Range   WBC 8.2  3.9 - 10.3 10e3/uL   NEUT# 5.4  1.5 - 6.5 10e3/uL   HGB 11.1 (*) 11.6 - 15.9 g/dL   HCT 16.1 (*) 09.6 - 04.5 %   Platelets 99 (*) 145 - 400 10e3/uL   MCV 82.6  79.5 - 101.0 fL   MCH 28.6  25.1 - 34.0 pg   MCHC 34.6  31.5 - 36.0 g/dL   RBC 4.09  8.11 - 9.14 10e6/uL   RDW 15.3 (*) 11.2 - 14.5 %   lymph# 1.9  0.9 - 3.3 10e3/uL   MONO# 0.7  0.1 - 0.9 10e3/uL   Eosinophils Absolute 0.2  0.0 - 0.5 10e3/uL   Basophils Absolute 0.0  0.0 - 0.1 10e3/uL   NEUT% 66.3  38.4 - 76.8 %   LYMPH% 22.7  14.0 - 49.7 %   MONO% 8.0  0.0 - 14.0 %   EOS% 2.5  0.0 - 7.0 %   BASO% 0.5  0.0 - 2.0 %  COMPREHENSIVE METABOLIC PANEL (CC13)     Status: Abnormal   Collection Time    09/21/13 11:17 AM      Result Value Range   Sodium 142  136 - 145 mEq/L   Potassium 4.1  3.5 - 5.1 mEq/L   Chloride 110 (*) 98 - 109 mEq/L   CO2 26  22 - 29 mEq/L   Glucose 73  70 - 140 mg/dl   BUN 4.7 (*) 7.0 - 78.2 mg/dL   Creatinine 0.8  0.6 - 1.1 mg/dL   Total Bilirubin 9.56  0.20 - 1.20 mg/dL   Alkaline Phosphatase 76  40 - 150 U/L   AST 15  5 - 34 U/L    ALT 12  0 - 55 U/L   Total Protein 7.0  6.4 - 8.3 g/dL   Albumin 3.3 (*) 3.5 - 5.0 g/dL   Calcium 8.7  8.4 - 21.3 mg/dL   Anion Gap 7  3 - 11 mEq/L       Chemistry      Component Value Date/Time   NA 142 09/21/2013 1117   NA 140 08/27/2013 0503   K 4.1 09/21/2013 1117   K 3.5 08/27/2013 0503   CL 106 08/27/2013 0503   CO2 26 09/21/2013 1117   CO2 24 08/27/2013 0503   BUN 4.7* 09/21/2013 1117   BUN 7 08/27/2013 0503   CREATININE 0.8 09/21/2013 1117   CREATININE 0.76 08/27/2013 0503      Component Value Date/Time   CALCIUM 8.7 09/21/2013 1117   CALCIUM 8.6 08/27/2013 0503  ALKPHOS 76 09/21/2013 1117   ALKPHOS 66 08/26/2013 0735   AST 15 09/21/2013 1117   AST 13 08/26/2013 0735   ALT 12 09/21/2013 1117   ALT 16 08/26/2013 0735   BILITOT 0.84 09/21/2013 1117   BILITOT 0.8 08/26/2013 0735     Basic Metabolic Panel:  Recent Labs Lab 09/21/13 1117  NA 142  K 4.1  CO2 26  GLUCOSE 73  BUN 4.7*  CREATININE 0.8  CALCIUM 8.7   GFR Estimated Creatinine Clearance: 102.9 ml/min (by C-G formula based on Cr of 0.8). Liver Function Tests:  Recent Labs Lab 09/21/13 1117  AST 15  ALT 12  ALKPHOS 76  BILITOT 0.84  PROT 7.0  ALBUMIN 3.3*   CBC:  Recent Labs Lab 09/21/13 1117  WBC 8.2  NEUTROABS 5.4  HGB 11.1*  HCT 32.1*  MCV 82.6  PLT 99*   Results for DHWANI, VENKATESH (MRN [id number]) as of 09/21/2013 23:07  Ref. Range 08/26/2013 16:14  Hep A IgM Latest Range: NEGATIVE  NEGATIVE  Hepatitis B Surface Ag Latest Range: NEGATIVE  NEGATIVE  Hep B C IgM Latest Range: NEGATIVE  NEGATIVE  HCV Ab Latest Range: NEGATIVE  NEGATIVE   Results for MADISYNN, PLAIR (MRN [id number]) as of 09/21/2013 23:07  Ref. Range 08/26/2013 11:20  TSH Latest Range: 0.350-4.500 uIU/mL 1.580   Iron/TIBC/Ferritin    Component Value Date/Time   IRON 92 08/26/2013 0735   TIBC 254 08/26/2013 0735   FERRITIN 19 08/26/2013 0735   RADIOGRAPHIC STUDIES: Ct Angio Chest Pe W/cm &/or Wo Cm  08/26/2013    CLINICAL DATA:  Left-sided chest pain.  EXAM: CT ANGIOGRAPHY CHEST WITH CONTRAST  TECHNIQUE: Multidetector CT imaging of the chest was performed using the standard protocol during bolus administration of intravenous contrast. Multiplanar CT image reconstructions including MIPs were obtained to evaluate the vascular anatomy.  CONTRAST:  OMNIPAQUE IOHEXOL 350 MG/ML SOLN  COMPARISON:  No priors.  FINDINGS: Mediastinum: There are no filling defects within the pulmonary arterial tree to suggest underlying pulmonary embolism. Heart size is borderline enlarged. There is no significant pericardial fluid, thickening or pericardial calcification. No pathologically enlarged mediastinal lymph nodes. Multiple borderline enlarged and mildly enlarged bilateral hilar lymph nodes are noted, largest of which measure 1 cm on the right and 1.1 cm on the left in short axis. Esophagus is unremarkable in appearance.  Lungs/Pleura: Diffuse ground-glass attenuation throughout the lower lobes of the lungs bilaterally is nonspecific, but can be seen in the setting of alveolar hemorrhage or developing infection. There are linear areas of architectural distortion in the lower lobes of the lungs bilaterally, which may represent some post infectious or inflammatory scarring. Multiple small thin-walled cysts are noted scattered throughout the lungs bilaterally, which are nonspecific. Trace left pleural effusion layering dependently. No definite suspicious appearing pulmonary nodule or masses identified at this time.  Upper Abdomen: Numerous calcified gallstones within the gallbladder. Gallbladder is incompletely visualized.  Musculoskeletal: There are no aggressive appearing lytic or blastic lesions noted in the visualized portions of the skeleton.  Review of the MIP images confirms the above findings.  IMPRESSION: 1. No evidence of pulmonary embolism. 2. Unusual appearance of the lower lobes of the lungs bilaterally where there is mild  diffuse ground-glass attenuation. This can be seen in the setting of alveolar hemorrhage (only likely if the patient has a recent history of hemoptysis) or developing infection. In addition, there is a trace left pleural effusion and there are areas of scarring in the  lung bases bilaterally. Notably, there is also some borderline enlarged and minimally enlarged bilateral hilar lymph nodes which are presumably reactive. 3. Mild cardiomegaly.   Electronically Signed   By: Trudie Reed M.D.   On: 08/26/2013 00:43    ASSESSMENT: Tina Mayo 42 y.o. female with a history of Thrombocytopenia, unspecified - Plan: CBC with Differential, Ferritin, Iron and TIBC, CBC with Differential, Iron and TIBC, Ferritin, Comprehensive metabolic panel, US Abdomen Complete  Tobacco abuse  Anemia, unspecified - Plan: CBC with Differential, Ferritin, Iron and TIBC, CBC with Differential, Iron and TIBC, Ferritin, Comprehensive metabolic panel, ferrous sulfate 325 (65 FE) MG EC tablet  I reviewed the smear doing her hospital admission and it was without evidence of fragmented red blood cells. Plts were low without evidence of clumping. Differential diagnosis for thrombocytopenia included a low grade DIC secondary to infections given ct of chest findings versus ITP or other systemic or bone marrow disorders. We did not have a baseline complete blood count to establish chronicity. HIV, Hepatitis panel was negative. TSH, WNL.  I could not palpate a spleen, lymphadenopathy and she does not have stigmata of liver disease. She denied alcohol use.   PLAN:  1. Thrombocytopenia of unclear etiology. --Her platlets are stable at 99 today.  She denies any episodes of bleeding.  We will check an abdominal ultrasound to rule of splenomegaly.  She did not appear to be hemolyzing on laboratory studies performed during her recent hospitalization.  We provided a detailed handout on thrombocytopenia.  If her thrombocytopenia remains  unexplained and she has splenomegaly or evidence of mucocutaneous bleeding, we may require a bone marrow biopsy. ITP is also a consideration of exclusion.  Might consider screening for autoimmune disorders with ANA, ESR.  2. Anemia NOS. -Mildly low ferritin of 19 suggestive of mild iron deficiency.  We will try ferrous sulfate 325 mg daily and repeat labs in 6 weeks.  Her hemoglobin is stable.   3. Scarring of the lung bases bilaterally/Ground glass appearance.  --She does endorse a history of crack cocaine use.  Not likely infection, given normal white blood count and denial of symptoms includes fevers or cough.   If more sympthomatic, we will need to repeat her scans of the chest in six months.   4. Tobacco abuse. - Counseled extensively on the need to stop smoking.  She decline tobacco cessation classes.     5. Sickle cell trait -- Patient denies any painful crisis episodes.    6. Follow-up.   --She repeat her CBC, iron studies in 6 weeks and CBC, iron studies and chemistrities upon her 3 month follow-up.   All questions were answered. The patient knows to call the clinic with any problems, questions or concerns. We can certainly see the patient much sooner if necessary.  I spent 15 minutes counseling the patient face to face. The total time spent in the appointment was 25 minutes.    Khaalid Lefkowitz, MD 09/21/2013 10:38 PM

## 2013-09-21 NOTE — Patient Instructions (Signed)
Iron Deficiency Anemia There are many types of anemia. Iron deficiency anemia is the most common. Iron deficiency anemia is a decrease in the number of red blood cells caused by too little iron. Without enough iron, your body does not produce enough hemoglobin. Hemoglobin is a substance in red blood cells that carries oxygen to the body's tissues. Iron deficiency anemia may leave you tired and short of breath. CAUSES   Lack of iron in the diet.  This may be seen in infants and children, because there is little iron in milk.  This may be seen in adults who do not eat enough iron-rich foods.  This may be seen in pregnant or breastfeeding women who do not take iron supplements. There is a much higher need for iron intake at these times.  Poor absorption of iron, as seen with intestinal disorders.  Intestinal bleeding.  Heavy periods. SYMPTOMS  Mild anemia may not be noticeable. Symptoms may include:  Fatigue.  Headache.  Pale skin.  Weakness.  Shortness of breath.  Dizziness.  Cold hands and feet.  Fast or irregular heartbeat. DIAGNOSIS  Diagnosis requires a thorough evaluation and physical exam by your caregiver.  Blood tests are generally used to confirm iron deficiency anemia.  Additional tests may be done to find the underlying cause of your anemia. These may include:  Testing for blood in the stool (fecal occult blood test).  A procedure to see inside the colon and rectum (colonoscopy).  A procedure to see inside the esophagus and stomach (endoscopy). TREATMENT   Correcting the cause of the iron deficiency is the first step.  Medicines, such as oral contraceptives, can make heavy menstrual flows lighter.  Antibiotics and other medicines can be used to treat peptic ulcers.  Surgery may be needed to remove a bleeding polyp, tumor, or fibroid.  Often, iron supplements (ferrous sulfate) are taken.  For the best iron absorption, take these supplements with an  empty stomach.  You may need to take the supplements with food if you cannot tolerate them on an empty stomach. Vitamin C improves the absorption of iron. Your caregiver may recommend taking your iron tablets with a glass of orange juice or vitamin C supplement.  Milk and antacids should not be taken at the same time as iron supplements. They may interfere with the absorption of iron.  Iron supplements can cause constipation. A stool softener is often recommended.  Pregnant and breastfeeding women will need to take extra iron, because their normal diet usually will not provide the required amount.  Patients who cannot tolerate iron by mouth can take it through a vein (intravenously) or by an injection into the muscle. HOME CARE INSTRUCTIONS   Ask your dietitian for help with diet questions.  Take iron and vitamins as directed by your caregiver.  Eat a diet rich in iron. Eat liver, lean beef, whole-grain bread, eggs, dried fruit, and dark green leafy vegetables. SEEK IMMEDIATE MEDICAL CARE IF:   You have a fainting episode. Do not drive yourself. Call your local emergency services (911 in [place] if no other help is available.  You have chest pain, nausea, or vomiting.  You develop severe or increased shortness of breath with activities.  You develop weakness or increased thirst.  You have a rapid heartbeat.  You develop unexplained sweating or become lightheaded when getting up from a chair or bed. MAKE SURE YOU:   Understand these instructions.  Will watch your condition.  Will get help right away   if you are not doing well or get worse. Document Released: 11/22/2000 Document Revised: 02/17/2012 Document Reviewed: 04/03/2010 [facility] Patient Information 2014 [employer], [place]. Anemia, Frequently Asked Questions WHAT ARE THE SYMPTOMS OF ANEMIA?  Headache.  Difficulty thinking.  Fatigue.  Shortness of breath.  Weakness.  Rapid heartbeat. AT WHAT POINT ARE PEOPLE  CONSIDERED ANEMIC?  This varies with gender and age.   Both hemoglobin (Hgb) and hematocrit values are used to define anemia. These lab values are obtained from a complete blood count (CBC) test. This is performed at a caregiver's office.  The normal range of hemoglobin values for adult men is 14.0 g/dL to 86.5 g/dL. For nonpregnant women, values are 12.3 g/dL to 78.4 g/dL.  The World Health Organization defines anemia as less than 12 g/dL for nonpregnant women and less than 13 g/dL for men.  For adult males, the average normal hematocrit is 46%, and the range is 40% to 52%.  For adult females, the average normal hematocrit is 41%, and the range is 35% to 47%.  Values that fall below the lower limits can be a sign of anemia and should have further checking (evaluation). GROUPS OF PEOPLE WHO ARE AT RISK FOR DEVELOPING ANEMIA INCLUDE:   Infants who are breastfed or taking a formula that is not fortified with iron.  Children going through a rapid growth spurt. The iron available can not keep up with the needs for a red cell mass which must grow with the child.  Women in childbearing years. They need iron because of blood loss during menstruation.  Pregnant women. The growing fetus creates a high demand for iron.  People with ongoing gastrointestinal blood loss are at risk of developing iron deficiency.  Individuals with leukemia or cancer who must receive chemotherapy or radiation to treat their disease. The drugs or radiation used to treat these diseases often decreases the bone marrow's ability to make cells of all classes. This includes red blood cells, white blood cells, and platelets.  Individuals with chronic inflammatory conditions such as rheumatoid arthritis or chronic infections.  The elderly. ARE SOME TYPES OF ANEMIA INHERITED?   Yes, some types of anemia are due to inherited or genetic defects.  Sickle cell anemia. This occurs most often in people of African, African  American, and Mediterranean descent.  Thalassemia (or Cooley's anemia). This type is found in people of Mediterranean and Southeast Asian descent. These types of anemia are common.  [place]i. This is rare. CAN CERTAIN MEDICATIONS CAUSE A PERSON TO BECOME ANEMIC?  Yes. For example, drugs to fight cancer (chemotherapeutic agents) often cause anemia. These drugs can slow the bone marrow's ability to make red blood cells. If there are not enough red blood cells, the body does not get enough oxygen. WHAT HEMATOCRIT LEVEL IS REQUIRED TO DONATE BLOOD?  The lower limit of an acceptable hematocrit for blood donors is 38%. If you have a low hematocrit value, you should schedule an appointment with your caregiver. ARE BLOOD TRANSFUSIONS COMMONLY USED TO CORRECT ANEMIA, AND ARE THEY DANGEROUS?  They are used to treat anemia as a last resort. Your caregiver will find the cause of the anemia and correct it if possible. Most blood transfusions are given because of excessive bleeding at the time of surgery, with trauma, or because of bone marrow suppression in patients with cancer or leukemia on chemotherapy. Blood transfusions are safer than ever before. We also know that blood transfusions affect the immune system and may increase certain risks. There  is also a concern for human error. In 1/16,000 transfusions, a patient receives a transfusion of blood that is not matched with his or her blood type.  WHAT IS IRON DEFICIENCY ANEMIA AND CAN I CORRECT IT BY CHANGING MY DIET?  Iron is an essential part of hemoglobin. Without enough hemoglobin, anemia develops and the body does not get the right amount of oxygen. Iron deficiency anemia develops after the body has had a low level of iron for a long time. This is either caused by blood loss, not taking in or absorbing enough iron, or increased demands for iron (like pregnancy or rapid growth).  Foods from animal origin such as beef, chicken, and pork, are good sources of  iron. Be sure to have one of these foods at each meal. Vitamin C helps your body absorb iron. Foods rich in Vitamin C include citrus, bell pepper, strawberries, spinach and cantaloupe. In some cases, iron supplements may be needed in order to correct the iron deficiency. In the case of poor absorption, extra iron may have to be given directly into the vein through a needle (intravenously). I HAVE BEEN DIAGNOSED WITH IRON DEFICIENCY ANEMIA AND MY CAREGIVER PRESCRIBED IRON SUPPLEMENTS. HOW LONG WILL IT TAKE FOR MY BLOOD TO BECOME NORMAL?  It depends on the degree of anemia at the beginning of treatment. Most people with mild to moderate iron deficiency, anemia will correct the anemia over a period of 2 to 3 months. But after the anemia is corrected, the iron stored by the body is still low. Caregivers often suggest an additional 6 months of oral iron therapy once the anemia has been reversed. This will help prevent the iron deficiency anemia from quickly happening again. Non-anemic adult males should take iron supplements only under the direction of a doctor, too much iron can cause liver damage.  MY HEMOGLOBIN IS 9 G/DL AND I AM SCHEDULED FOR SURGERY. SHOULD I POSTPONE THE SURGERY?  If you have Hgb of 9, you should discuss this with your caregiver right away. Many patients with similar hemoglobin levels have had surgery without problems. If minimal blood loss is expected for a minor procedure, no treatment may be necessary.  If a greater blood loss is expected for more extensive procedures, you should ask your caregiver about being treated with erythropoietin and iron. This is to accelerate the recovery of your hemoglobin to a normal level before surgery. An anemic patient who undergoes high-blood-loss surgery has a greater risk of surgical complications and need for a blood transfusion, which also carries some risk.  I HAVE BEEN TOLD THAT HEAVY MENSTRUAL PERIODS CAUSE ANEMIA. IS THERE ANYTHING I CAN DO TO  PREVENT THE ANEMIA?  Anemia that results from heavy periods is usually due to iron deficiency. You can try to meet the increased demands for iron caused by the heavy monthly blood loss by increasing the intake of iron-rich foods. Iron supplements may be required. Discuss your concerns with your caregiver. WHAT CAUSES ANEMIA DURING PREGNANCY?  Pregnancy places major demands on the body. The mother must meet the needs of both her body and her growing baby. The body needs enough iron and folate to make the right amount of red blood cells. To prevent anemia while pregnant, the mother should stay in close contact with her caregiver.  Be sure to eat a diet that has foods rich in iron and folate like liver and dark green leafy vegetables. Folate plays an important role in the normal development of a  baby's spinal cord. Folate can help prevent serious disorders like spina bifida. If your diet does not provide adequate nutrients, you may want to talk with your caregiver about nutritional supplements.  WHAT IS THE RELATIONSHIP BETWEEN FIBROID TUMORS AND ANEMIA IN WOMEN?  The relationship is usually caused by the increased menstrual blood loss caused by fibroids. Good iron intake may be required to prevent iron deficiency anemia from developing.  Document Released: 07/03/2004 Document Revised: 02/17/2012 Document Reviewed: 12/18/2010 [facility] Patient Information 2014 [place], [place]. Thrombocytopenia Thrombocytopenia is a condition in which there is an abnormally small number of platelets in your blood. Platelets are also called thrombocytes. Platelets are needed for blood clotting. CAUSES Thrombocytopenia is caused by:   Decreased production of platelets. This can be caused by:  Aplastic anemia in which your bone marrow quits making blood cells.  Cancer in the bone marrow.  Use of certain medicines, including chemotherapy.  Infection in the bone marrow.  Heavy alcohol consumption.  Increased  destruction of platelets. This can be caused by:  Certain immune diseases.  Use of certain drugs.  Certain blood clotting disorders.  Certain inherited disorders.  Certain bleeding disorders.  Pregnancy.  Having an enlarged spleen (hypersplenism). In hypersplenism, the spleen gathers up platelets from circulation. This means the platelets are not available to help with blood clotting. The spleen can enlarge due to cirrhosis or other conditions. SYMPTOMS  The symptoms of thrombocytopenia are side effects of poor blood clotting. Some of these are:  Abnormal bleeding.  Nosebleeds.  Heavy menstrual periods.  Blood in the urine or stools.  Purpura. This is a purplish discoloration in the skin produced by small bleeding vessels near the surface of the skin.  Bruising.  A rash that may be petechial. This looks like pinpoint, purplish-red spots on the skin and mucous membranes. It is caused by bleeding from small blood vessels (capillaries). DIAGNOSIS  Your caregiver will make this diagnosis based on your exam and blood tests. Sometimes, a bone marrow study is done to look for the original cells (megakaryocytes) that make platelets. TREATMENT  Treatment depends on the cause of the condition.  Medicines may be given to help protect your platelets from being destroyed.  In some cases, a replacement (transfusion) of platelets may be required to stop or prevent bleeding.  Sometimes, the spleen must be surgically removed. HOME CARE INSTRUCTIONS   Check the skin and linings inside your mouth for bruising or bleeding as directed by your caregiver.  Check your sputum, urine, and stool for blood as directed by your caregiver.  Do not return to any activities that could cause bumps or bruises until your caregiver says it is okay.  Take extra care not to cut yourself when shaving or when using scissors, needles, knives, and other tools.  Take extra care not to burn yourself when  ironing or cooking.  Ask your caregiver if it is okay for you to drink alcohol.  Only take over-the-counter or prescription medicines as directed by your caregiver.  Notify all your caregivers, including dentists and eye doctors, about your condition. SEEK IMMEDIATE MEDICAL CARE IF:   You develop active bleeding from anywhere in your body.  You develop unexplained bruising or bleeding.  You have blood in your sputum, urine, or stool. MAKE SURE YOU:  Understand these instructions.  Will watch your condition.  Will get help right away if you are not doing well or get worse. Document Released: 11/25/2005 Document Revised: 02/17/2012 Document Reviewed: 09/27/2011 [employer]  Patient Information 2014 [employer].  

## 2013-09-21 NOTE — Telephone Encounter (Signed)
Gave pt appt for lab and MD for December and january 2015

## 2013-09-21 NOTE — Progress Notes (Signed)
Checked in new patient with no financial issues. Gave her appt card. She has no PCP she is new in town. Was referred by hospital. I emailed Tiffany.

## 2013-10-12 ENCOUNTER — Encounter: Payer: Self-pay | Admitting: Internal Medicine

## 2013-10-12 ENCOUNTER — Ambulatory Visit: Payer: Medicaid Other | Attending: Internal Medicine | Admitting: Internal Medicine

## 2013-10-12 VITALS — BP 121/82 | HR 84 | Temp 98.0°F | Resp 16 | Ht 67.0 in | Wt 192.0 lb

## 2013-10-12 DIAGNOSIS — D509 Iron deficiency anemia, unspecified: Secondary | ICD-10-CM | POA: Insufficient documentation

## 2013-10-12 DIAGNOSIS — D649 Anemia, unspecified: Secondary | ICD-10-CM

## 2013-10-12 DIAGNOSIS — Z0000 Encounter for general adult medical examination without abnormal findings: Secondary | ICD-10-CM

## 2013-10-12 DIAGNOSIS — D696 Thrombocytopenia, unspecified: Secondary | ICD-10-CM | POA: Insufficient documentation

## 2013-10-12 DIAGNOSIS — J45909 Unspecified asthma, uncomplicated: Secondary | ICD-10-CM

## 2013-10-12 MED ORDER — ALBUTEROL SULFATE HFA 108 (90 BASE) MCG/ACT IN AERS: 2.0000 | INHALATION_SPRAY | RESPIRATORY_TRACT | Status: DC | PRN

## 2013-10-12 NOTE — Progress Notes (Signed)
Pt is here to establish care. Pt is requesting a physical.  C.C. She was in the hospital with chest pain. No pain today but when she wakes up her sternum pops.

## 2013-10-12 NOTE — Progress Notes (Signed)
Patient Demographics  Tina Mayo, is a 42 y.o. female  [id number]  [id number]  DOB - [birth year]  Chief Complaint  Patient presents with  . Establish Care        Subjective:   Tina Mayo today is here to establish primary care. Patient has no current complaints. She is here to establish care  Patient has a history of thrombocytopenia which she follows up with hematology, history of anemia, history of mild intermittent asthma who presents today for routine post hospital discharge visit. She has no complaints. She is wanting to have screening mammographies and Pap smears, she has had them approximately 2 years back when she was still living in [place]  Currently patient has no complaints. Patient has also has No headache, No chest pain, No abdominal pain,No Nausea, No new weakness tingling or numbness, No Cough or SOB.   Objective:    Filed Vitals:   10/12/13 1408  BP: 121/82  Pulse: 84  Temp: 98 F (36.7 C)  TempSrc: Oral  Resp: 16  Height: 5\' 7"  (1.702 m)  Weight: 192 lb (87.091 kg)  SpO2: 100%     ALLERGIES:   Allergies  Allergen Reactions  . Levaquin [Levofloxacin In D5w] Other (See Comments)    Tightening in throat    PAST MEDICAL HISTORY: Past Medical History  Diagnosis Date  . Depression   . Anxiety     PAST SURGICAL HISTORY: Past Surgical History  Procedure Laterality Date  . Right ankle surgery      2010  . Tubal ligation    . Benign breast tumor removed      2006    FAMILY HISTORY: Family History  Problem Relation Age of Onset  . Breast cancer Mother   . Cancer Mother   . Diabetes Mellitus II Father   . Diabetes Father     MEDICATIONS AT HOME: Prior to Admission medications   Medication Sig Start Date End Date Taking? Authorizing Provider  ferrous sulfate 325 (65 FE) MG EC tablet Take 1 tablet (325 mg total) by mouth daily with breakfast. 09/21/13  Yes Myra Rude, MD  albuterol (PROVENTIL HFA;VENTOLIN HFA) 108  (90 BASE) MCG/ACT inhaler Inhale 2 puffs into the lungs every 4 (four) hours as needed for wheezing or shortness of breath. 10/12/13   Shanker Levora Dredge, MD  diphenhydrAMINE (BENADRYL) 25 MG tablet Take 25 mg by mouth every 8 (eight) hours as needed for sleep.    Historical Provider, MD  Pseudoeph-Doxylamine-DM-APAP (NYQUIL PO) Take 15 mLs by mouth at bedtime as needed (for sleep).    Historical Provider, MD    SOCIAL HISTORY:   reports that she has been smoking Cigarettes.  She has been smoking about 0.50 packs per day. She uses smokeless tobacco. She reports that she does not drink alcohol or use illicit drugs.  REVIEW OF SYSTEMS:  Constitutional:   No   Fevers, chills, fatigue.  HEENT:    No headaches, Sore throat,   Cardio-vascular: No chest pain,  Orthopnea, swelling in lower extremities, anasarca, palpitations  GI:  No abdominal pain, nausea, vomiting, diarrhea  Resp: No shortness of breath,  No coughing up of blood.No cough.No wheezing.  Skin:  no rash or lesions.  GU:  no dysuria, change in color of urine, no urgency or frequency.  No flank pain.  Musculoskeletal: No joint pain or swelling.  No decreased range of motion.  No back pain.  Psych: No change in mood or affect. No depression or anxiety.  No memory loss.   Exam  General appearance :Awake, alert, not in any distress. Speech Clear. Not toxic Looking HEENT: Atraumatic and Normocephalic, pupils equally reactive to light and accomodation Neck: supple, no JVD. No cervical lymphadenopathy.  Chest:Good air entry bilaterally, no added sounds  CVS: S1 S2 regular, no murmurs.  Abdomen: Bowel sounds present, Non tender and not distended with no gaurding, rigidity or rebound. Extremities: B/L Lower Ext shows no edema, both legs are warm to touch Neurology: Awake alert, and oriented X 3, CN II-XII intact, Non focal Skin:No Rash Wounds:N/A    Data Review   CBC No results found for this basename: WBC, HGB, HCT,  PLT, MCV, MCH, MCHC, RDW, NEUTRABS, LYMPHSABS, MONOABS, EOSABS, BASOSABS, BANDABS, BANDSABD,  in the last 168 hours  Chemistries   No results found for this basename: NA, K, CL, CO2, GLUCOSE, BUN, CREATININE, GFRCGP, CALCIUM, MG, AST, ALT, ALKPHOS, BILITOT,  in the last 168 hours ------------------------------------------------------------------------------------------------------------------ No results found for this basename: HGBA1C,  in the last 72 hours ------------------------------------------------------------------------------------------------------------------ No results found for this basename: CHOL, HDL, LDLCALC, TRIG, CHOLHDL, LDLDIRECT,  in the last 72 hours ------------------------------------------------------------------------------------------------------------------ No results found for this basename: TSH, T4TOTAL, FREET3, T3FREE, THYROIDAB,  in the last 72 hours ------------------------------------------------------------------------------------------------------------------ No results found for this basename: VITAMINB12, FOLATE, FERRITIN, TIBC, IRON, RETICCTPCT,  in the last 72 hours  Coagulation profile  No results found for this basename: INR, PROTIME,  in the last 168 hours    Assessment & Plan   Thrombocytopenia - Etiology not known, being worked up and followed by hematology oncology- we will defer further management to them  Anemia - To contribute to it deficiency, on iron supplementation.  History of bronchial asthma - Mild intermittent asthma-lungs clear-she requests a when necessary albuterol inhaler-will provide her a prescription  ? Abnormal CT chest  - Will continue to follow and maybe repeat in 3 months-at her next followup visit   Health Maintenance -Pap Smear: Have referred to GYN  -Mammogram: Have ordered   Follow up in 3 months  The patient was given clear instructions to go to ER or return to medical center if symptoms don't improve,  worsen or new problems develop. The patient verbalized understanding. The patient was told to call to get lab results if they haven't heard anything in the next week.

## 2013-10-13 ENCOUNTER — Other Ambulatory Visit: Payer: Self-pay | Admitting: Internal Medicine

## 2013-10-13 DIAGNOSIS — Z1231 Encounter for screening mammogram for malignant neoplasm of breast: Secondary | ICD-10-CM

## 2013-10-24 ENCOUNTER — Encounter (HOSPITAL_COMMUNITY): Payer: Self-pay | Admitting: Emergency Medicine

## 2013-10-24 ENCOUNTER — Emergency Department (HOSPITAL_COMMUNITY)
Admission: EM | Admit: 2013-10-24 | Discharge: 2013-10-24 | Disposition: A | Discharge status: Home / Self Care | Payer: Medicaid Other | Attending: Emergency Medicine | Admitting: Emergency Medicine

## 2013-10-24 DIAGNOSIS — M779 Enthesopathy, unspecified: Secondary | ICD-10-CM

## 2013-10-24 DIAGNOSIS — M79609 Pain in unspecified limb: Secondary | ICD-10-CM

## 2013-10-24 DIAGNOSIS — J45909 Unspecified asthma, uncomplicated: Secondary | ICD-10-CM | POA: Insufficient documentation

## 2013-10-24 DIAGNOSIS — F411 Generalized anxiety disorder: Secondary | ICD-10-CM | POA: Insufficient documentation

## 2013-10-24 DIAGNOSIS — M7520 Bicipital tendinitis, unspecified shoulder: Secondary | ICD-10-CM | POA: Insufficient documentation

## 2013-10-24 DIAGNOSIS — Z79899 Other long term (current) drug therapy: Secondary | ICD-10-CM | POA: Insufficient documentation

## 2013-10-24 DIAGNOSIS — M79601 Pain in right arm: Secondary | ICD-10-CM

## 2013-10-24 DIAGNOSIS — F17200 Nicotine dependence, unspecified, uncomplicated: Secondary | ICD-10-CM | POA: Insufficient documentation

## 2013-10-24 HISTORY — DX: Unspecified asthma, uncomplicated: J45.909

## 2013-10-24 LAB — CBC WITH DIFFERENTIAL/PLATELET
Basophils Absolute: 0 10*3/uL (ref 0.0–0.1)
Basophils Relative: 0 % (ref 0–1)
Eosinophils Absolute: 0.2 10*3/uL (ref 0.0–0.7)
Eosinophils Relative: 2 % (ref 0–5)
HCT: 31.7 % — ABNORMAL LOW (ref 36.0–46.0)
Hemoglobin: 11.6 g/dL — ABNORMAL LOW (ref 12.0–15.0)
Lymphocytes Relative: 23 % (ref 12–46)
Lymphs Abs: 2.1 10*3/uL (ref 0.7–4.0)
MCH: 28.6 pg (ref 26.0–34.0)
MCHC: 36.6 g/dL — ABNORMAL HIGH (ref 30.0–36.0)
MCV: 78.1 fL (ref 78.0–100.0)
Monocytes Absolute: 0.6 10*3/uL (ref 0.1–1.0)
Monocytes Relative: 7 % (ref 3–12)
Neutro Abs: 5.9 10*3/uL (ref 1.7–7.7)
Neutrophils Relative %: 67 % (ref 43–77)
Platelets: 85 10*3/uL — ABNORMAL LOW (ref 150–400)
RBC: 4.06 MIL/uL (ref 3.87–5.11)
RDW: 15 % (ref 11.5–15.5)
WBC: 8.8 10*3/uL (ref 4.0–10.5)

## 2013-10-24 LAB — BASIC METABOLIC PANEL
BUN: 7 mg/dL (ref 6–23)
CO2: 24 mEq/L (ref 19–32)
Calcium: 9.1 mg/dL (ref 8.4–10.5)
Chloride: 105 mEq/L (ref 96–112)
Creatinine, Ser: 0.75 mg/dL (ref 0.50–1.10)
GFR calc Af Amer: >90 mL/min (ref >90)
GFR calc non Af Amer: >90 mL/min (ref >90)
Glucose, Bld: 86 mg/dL (ref 70–99)
Potassium: 4.2 mEq/L (ref 3.5–5.1)
Sodium: 139 mEq/L (ref 135–145)

## 2013-10-24 MED ORDER — HYDROCODONE-ACETAMINOPHEN 5-325 MG PO TABS: 1.0000 | ORAL_TABLET | Freq: Four times a day (QID) | ORAL | Status: DC | PRN

## 2013-10-24 MED ORDER — IBUPROFEN 800 MG PO TABS: 800.0000 mg | ORAL_TABLET | Freq: Three times a day (TID) | ORAL | Status: DC | PRN

## 2013-10-24 MED ORDER — MORPHINE SULFATE 4 MG/ML IJ SOLN
6.0000 mg | Freq: Once | INTRAMUSCULAR | Status: AC
  Administered 2013-10-24: 6 mg via INTRAMUSCULAR
  Filled 2013-10-24: qty 2

## 2013-10-24 MED ORDER — KETOROLAC TROMETHAMINE 60 MG/2ML IM SOLN
60.0000 mg | Freq: Once | INTRAMUSCULAR | Status: AC
  Administered 2013-10-24: 60 mg via INTRAMUSCULAR
  Filled 2013-10-24: qty 2

## 2013-10-24 MED ORDER — OXYCODONE-ACETAMINOPHEN 5-325 MG PO TABS
1.0000 | ORAL_TABLET | Freq: Once | ORAL | Status: AC
  Administered 2013-10-24: 1 via ORAL
  Filled 2013-10-24: qty 1

## 2013-10-24 NOTE — Progress Notes (Signed)
VASCULAR LAB PRELIMINARY  PRELIMINARY  PRELIMINARY  PRELIMINARY  Right upper extremity venous Doppler completed.    Preliminary report:  There is no DVT or SVT noted in the right upper extremity.  Tionne Carelli, RVT 10/24/2013, 11:34 AM

## 2013-10-24 NOTE — ED Notes (Signed)
Patient discharged to home with family. NAD.  

## 2013-10-24 NOTE — ED Notes (Addendum)
To Ed via [facility] from home with c/o right arm pain started 3 days ago- states "throbbing constantly" has been wearing "constricting bands on arms at night to help with pain" ambulated from ambulance without difficulty-- has hx of DVT in right leg in september

## 2013-10-24 NOTE — ED Provider Notes (Signed)
Medical screening examination/treatment/procedure(s) were performed by non-physician practitioner and as supervising physician I was immediately available for consultation/collaboration.  EKG Interpretation   None         Mckensi Redinger B. Bernette Mayers, MD 10/24/13 1315

## 2013-10-24 NOTE — ED Notes (Signed)
Right arm sling applied.

## 2013-10-24 NOTE — ED Provider Notes (Signed)
CSN: [id number]     Arrival date & time 10/24/13  4098 History   First MD Initiated Contact with Patient 10/24/13 1014     Chief Complaint  Patient presents with  . Arm Pain   (Consider location/radiation/quality/duration/timing/severity/associated sxs/prior Treatment) HPI Patient presents to the emergency department with right arm discomfort and pain.  She states she has a swollen area below the base of her right, thumb.  She's also complaining of pain between her deltoid and elbow.  Patient, states she's not had any signs of trauma or injury.  Patient, states, that the pain started gradually on Friday has gotten worse over the weekend.  Patient, states she tied a plastic band around her arm tightly to see if that will reduce pain.  Patient, states she took [facility] powders and is upset her stomach.  Patient, states, that palpation and movement make her pain, worse. Past Medical History  Diagnosis Date  . Depression   . Anxiety   . Asthma    Past Surgical History  Procedure Laterality Date  . Right ankle surgery      2010  . Tubal ligation    . Benign breast tumor removed      2006   Family History  Problem Relation Age of Onset  . Breast cancer Mother   . Cancer Mother   . Diabetes Mellitus II Father   . Diabetes Father    History  Substance Use Topics  . Smoking status: Current Every Day Smoker -- 0.50 packs/day    Types: Cigarettes  . Smokeless tobacco: Current User  . Alcohol Use: No   OB History   Grav Para Term Preterm Abortions TAB SAB Ect Mult Living                 Review of Systems All other systems negative except as documented in the HPI. All pertinent positives and negatives as reviewed in the HPI. Allergies  Levaquin  Home Medications   Current Outpatient Rx  Name  Route  Sig  Dispense  Refill  . albuterol (PROVENTIL HFA;VENTOLIN HFA) 108 (90 BASE) MCG/ACT inhaler   Inhalation   Inhale 2 puffs into the lungs every 4 (four) hours as needed for wheezing  or shortness of breath.   1 Inhaler   3   . ferrous sulfate 325 (65 FE) MG EC tablet   Oral   Take 1 tablet (325 mg total) by mouth daily with breakfast.   30 tablet   1    BP 120/77  Pulse 89  Temp(Src) 98.8 F (37.1 C) (Oral)  Resp 20  Ht 5\' 7"  (1.702 m)  Wt 192 lb (87.091 kg)  BMI 30.06 kg/m2  SpO2 100% Physical Exam  Nursing note and vitals reviewed. Constitutional: She is oriented to person, place, and time. She appears well-developed and well-nourished. No distress.  HENT:  Head: Normocephalic and atraumatic.  Mouth/Throat: Oropharynx is clear and moist.  Cardiovascular: Normal rate, regular rhythm and normal heart sounds.   Pulmonary/Chest: Effort normal and breath sounds normal. No respiratory distress.  Musculoskeletal:       Right upper arm: She exhibits tenderness. She exhibits no bony tenderness, no swelling, no edema and no deformity.       Right forearm: She exhibits tenderness and swelling. She exhibits no bony tenderness, no edema, no deformity and no laceration.  Neurological: She is alert and oriented to person, place, and time.  Skin: Skin is warm and dry. No erythema.  Psychiatric: Her  mood appears anxious.    ED Course  Procedures (including critical care time) Labs Review Labs Reviewed  CBC WITH DIFFERENTIAL - Abnormal; Notable for the following:    Hemoglobin 11.6 (*)    HCT 31.7 (*)    MCHC 36.6 (*)    Platelets 85 (*)    All other components within normal limits  BASIC METABOLIC PANEL   Patient's main concentration.  The pain is in the area of the biceps on the right.  This may have some biceps tendinitis, but does not have any rupture.  The biceps does have some mild swelling at the base of the right thumb.  This could represent tendinitis as well, but there is no signs of DVT on her ultrasound.  The patient does not have any signs of any chest-related issue.  Pain is isolated, just to her right arm in the areas mentioned  above.    Carlyle Dolly, PA-C 10/24/13 1314

## 2013-11-02 ENCOUNTER — Ambulatory Visit (HOSPITAL_COMMUNITY): Payer: Medicaid Other

## 2013-11-08 ENCOUNTER — Other Ambulatory Visit (HOSPITAL_BASED_OUTPATIENT_CLINIC_OR_DEPARTMENT_OTHER): Payer: Medicaid Other

## 2013-11-08 DIAGNOSIS — D696 Thrombocytopenia, unspecified: Secondary | ICD-10-CM

## 2013-11-08 DIAGNOSIS — D649 Anemia, unspecified: Secondary | ICD-10-CM

## 2013-11-08 LAB — CBC WITH DIFFERENTIAL/PLATELET
Basophils Absolute: 0.1 10*3/uL (ref 0.0–0.1)
Eosinophils Absolute: 0.3 10*3/uL (ref 0.0–0.5)
HGB: 11.8 g/dL (ref 11.6–15.9)
LYMPH%: 24.8 % (ref 14.0–49.7)
MCV: 84.7 fL (ref 79.5–101.0)
MONO%: 6.5 % (ref 0.0–14.0)
NEUT#: 6.3 10*3/uL (ref 1.5–6.5)
Platelets: 107 10*3/uL — ABNORMAL LOW (ref 145–400)
RBC: 4.07 10*6/uL (ref 3.70–5.45)

## 2013-11-08 LAB — COMPREHENSIVE METABOLIC PANEL (CC13)
ALT: 21 U/L (ref 0–55)
Anion Gap: 8 mEq/L (ref 3–11)
CO2: 23 mEq/L (ref 22–29)
Calcium: 8.9 mg/dL (ref 8.4–10.4)
Chloride: 109 mEq/L (ref 98–109)
Potassium: 4.2 mEq/L (ref 3.5–5.1)
Sodium: 139 mEq/L (ref 136–145)
Total Protein: 7.6 g/dL (ref 6.4–8.3)

## 2013-11-08 LAB — IRON AND TIBC CHCC: %SAT: 24 % (ref 21–57)

## 2013-11-11 ENCOUNTER — Ambulatory Visit
Admission: RE | Admit: 2013-11-11 | Discharge: 2013-11-11 | Disposition: A | Discharge status: Home / Self Care | Payer: Medicaid Other | Source: Ambulatory Visit | Attending: Internal Medicine | Admitting: Internal Medicine

## 2013-11-11 DIAGNOSIS — Z1231 Encounter for screening mammogram for malignant neoplasm of breast: Secondary | ICD-10-CM

## 2013-11-16 ENCOUNTER — Other Ambulatory Visit: Payer: Self-pay | Admitting: Internal Medicine

## 2013-11-16 DIAGNOSIS — R928 Other abnormal and inconclusive findings on diagnostic imaging of breast: Secondary | ICD-10-CM

## 2013-11-19 ENCOUNTER — Other Ambulatory Visit: Payer: Self-pay | Admitting: Internal Medicine

## 2013-11-19 ENCOUNTER — Other Ambulatory Visit: Payer: Self-pay

## 2013-11-19 ENCOUNTER — Telehealth: Payer: Self-pay | Admitting: *Deleted

## 2013-11-19 DIAGNOSIS — R928 Other abnormal and inconclusive findings on diagnostic imaging of breast: Secondary | ICD-10-CM

## 2013-11-19 NOTE — Telephone Encounter (Signed)
sw the pt informed the pt that their provider is on call 12/22/13. gv appt for 12/1613 w/labs@ 2:30pm and ov@ 3pm. Pt is aware...td

## 2013-11-25 ENCOUNTER — Other Ambulatory Visit: Payer: Medicaid Other

## 2013-11-26 ENCOUNTER — Ambulatory Visit
Admission: RE | Admit: 2013-11-26 | Discharge: 2013-11-26 | Disposition: A | Discharge status: Home / Self Care | Payer: Medicaid Other | Source: Ambulatory Visit | Attending: Internal Medicine | Admitting: Internal Medicine

## 2013-11-26 ENCOUNTER — Other Ambulatory Visit: Payer: Self-pay | Admitting: Internal Medicine

## 2013-11-26 DIAGNOSIS — N6310 Unspecified lump in the right breast, unspecified quadrant: Secondary | ICD-10-CM

## 2013-11-26 DIAGNOSIS — R928 Other abnormal and inconclusive findings on diagnostic imaging of breast: Secondary | ICD-10-CM

## 2013-12-06 ENCOUNTER — Other Ambulatory Visit: Payer: Self-pay | Admitting: Internal Medicine

## 2013-12-06 ENCOUNTER — Encounter: Payer: Medicaid Other | Admitting: Obstetrics & Gynecology

## 2013-12-06 ENCOUNTER — Ambulatory Visit
Admission: RE | Admit: 2013-12-06 | Discharge: 2013-12-06 | Disposition: A | Discharge status: Home / Self Care | Payer: Medicaid Other | Source: Ambulatory Visit | Attending: Internal Medicine | Admitting: Internal Medicine

## 2013-12-06 DIAGNOSIS — N6310 Unspecified lump in the right breast, unspecified quadrant: Secondary | ICD-10-CM

## 2013-12-20 ENCOUNTER — Ambulatory Visit (HOSPITAL_COMMUNITY)
Admission: RE | Admit: 2013-12-20 | Discharge: 2013-12-20 | Disposition: A | Discharge status: Home / Self Care | Payer: Medicaid Other | Source: Ambulatory Visit | Attending: Internal Medicine | Admitting: Internal Medicine

## 2013-12-20 ENCOUNTER — Ambulatory Visit (HOSPITAL_COMMUNITY): Payer: Medicaid Other

## 2013-12-20 DIAGNOSIS — D696 Thrombocytopenia, unspecified: Secondary | ICD-10-CM

## 2013-12-20 DIAGNOSIS — K8020 Calculus of gallbladder without cholecystitis without obstruction: Secondary | ICD-10-CM | POA: Insufficient documentation

## 2013-12-21 ENCOUNTER — Telehealth: Payer: Self-pay | Admitting: Internal Medicine

## 2013-12-21 NOTE — Telephone Encounter (Signed)
Attempted to leave message.  Patient number is invalid.

## 2013-12-22 ENCOUNTER — Ambulatory Visit: Payer: Medicaid Other

## 2013-12-22 ENCOUNTER — Other Ambulatory Visit: Payer: Medicaid Other

## 2013-12-23 ENCOUNTER — Telehealth: Payer: Self-pay | Admitting: Internal Medicine

## 2013-12-23 NOTE — Telephone Encounter (Signed)
returned pt call adn lvm with d/t of 1.16.15 appt conf per pt request

## 2013-12-24 ENCOUNTER — Telehealth: Payer: Self-pay | Admitting: *Deleted

## 2013-12-24 ENCOUNTER — Ambulatory Visit (HOSPITAL_BASED_OUTPATIENT_CLINIC_OR_DEPARTMENT_OTHER): Payer: Medicaid Other | Admitting: Internal Medicine

## 2013-12-24 ENCOUNTER — Other Ambulatory Visit (HOSPITAL_BASED_OUTPATIENT_CLINIC_OR_DEPARTMENT_OTHER): Payer: Medicaid Other

## 2013-12-24 VITALS — BP 111/66 | HR 76 | Temp 98.1°F | Resp 18 | Ht 67.0 in | Wt 195.7 lb

## 2013-12-24 DIAGNOSIS — D509 Iron deficiency anemia, unspecified: Secondary | ICD-10-CM

## 2013-12-24 DIAGNOSIS — Z720 Tobacco use: Secondary | ICD-10-CM

## 2013-12-24 DIAGNOSIS — D696 Thrombocytopenia, unspecified: Secondary | ICD-10-CM

## 2013-12-24 DIAGNOSIS — G4700 Insomnia, unspecified: Secondary | ICD-10-CM

## 2013-12-24 DIAGNOSIS — N6009 Solitary cyst of unspecified breast: Secondary | ICD-10-CM

## 2013-12-24 DIAGNOSIS — J984 Other disorders of lung: Secondary | ICD-10-CM

## 2013-12-24 DIAGNOSIS — D249 Benign neoplasm of unspecified breast: Secondary | ICD-10-CM

## 2013-12-24 DIAGNOSIS — D649 Anemia, unspecified: Secondary | ICD-10-CM

## 2013-12-24 DIAGNOSIS — D573 Sickle-cell trait: Secondary | ICD-10-CM

## 2013-12-24 LAB — COMPREHENSIVE METABOLIC PANEL (CC13)
ALBUMIN: 3.8 g/dL (ref 3.5–5.0)
ALK PHOS: 78 U/L (ref 40–150)
ALT: 10 U/L (ref 0–55)
AST: 14 U/L (ref 5–34)
Anion Gap: 8 mEq/L (ref 3–11)
BUN: 5.8 mg/dL — AB (ref 7.0–26.0)
CALCIUM: 8.8 mg/dL (ref 8.4–10.4)
CHLORIDE: 107 meq/L (ref 98–109)
CO2: 25 mEq/L (ref 22–29)
CREATININE: 0.8 mg/dL (ref 0.6–1.1)
Glucose: 88 mg/dl (ref 70–140)
Potassium: 3.8 mEq/L (ref 3.5–5.1)
Sodium: 140 mEq/L (ref 136–145)
Total Bilirubin: 0.75 mg/dL (ref 0.20–1.20)
Total Protein: 7.2 g/dL (ref 6.4–8.3)

## 2013-12-24 LAB — CBC WITH DIFFERENTIAL/PLATELET
BASO%: 0.7 % (ref 0.0–2.0)
Basophils Absolute: 0.1 10*3/uL (ref 0.0–0.1)
EOS%: 2.9 % (ref 0.0–7.0)
Eosinophils Absolute: 0.2 10*3/uL (ref 0.0–0.5)
HEMATOCRIT: 32.5 % — AB (ref 34.8–46.6)
HGB: 11.1 g/dL — ABNORMAL LOW (ref 11.6–15.9)
LYMPH#: 2.1 10*3/uL (ref 0.9–3.3)
LYMPH%: 24.7 % (ref 14.0–49.7)
MCH: 28.6 pg (ref 25.1–34.0)
MCHC: 34.2 g/dL (ref 31.5–36.0)
MCV: 83.8 fL (ref 79.5–101.0)
MONO#: 0.4 10*3/uL (ref 0.1–0.9)
MONO%: 4.8 % (ref 0.0–14.0)
NEUT#: 5.6 10*3/uL (ref 1.5–6.5)
NEUT%: 66.9 % (ref 38.4–76.8)
Platelets: 106 10*3/uL — ABNORMAL LOW (ref 145–400)
RBC: 3.88 10*6/uL (ref 3.70–5.45)
RDW: 15.5 % — AB (ref 11.2–14.5)
WBC: 8.3 10*3/uL (ref 3.9–10.3)

## 2013-12-24 LAB — FERRITIN CHCC: Ferritin: 19 ng/ml (ref 9–269)

## 2013-12-24 LAB — IRON AND TIBC CHCC
%SAT: 15 % — AB (ref 21–57)
IRON: 44 ug/dL (ref 41–142)
TIBC: 301 ug/dL (ref 236–444)
UIBC: 257 ug/dL (ref 120–384)

## 2013-12-24 NOTE — Patient Instructions (Signed)
Anemia, Nonspecific Anemia is a condition in which the concentration of red blood cells or hemoglobin in the blood is below normal. Hemoglobin is a substance in red blood cells that carries oxygen to the tissues of the body. Anemia results in not enough oxygen reaching these tissues.  CAUSES  Common causes of anemia include:   Excessive bleeding. Bleeding may be internal or external. This includes excessive bleeding from periods (in women) or from the intestine.   Poor nutrition.   Chronic kidney, thyroid, and liver disease.  Bone marrow disorders that decrease red blood cell production.  Cancer and treatments for cancer.  HIV, AIDS, and their treatments.  Spleen problems that increase red blood cell destruction.  Blood disorders.  Excess destruction of red blood cells due to infection, medicines, and autoimmune disorders. SIGNS AND SYMPTOMS   Minor weakness.   Dizziness.   Headache.  Palpitations.   Shortness of breath, especially with exercise.   Paleness.  Cold sensitivity.  Indigestion.  Nausea.  Difficulty sleeping.  Difficulty concentrating. Symptoms may occur suddenly or they may develop slowly.  DIAGNOSIS  Additional blood tests are often needed. These help your health care provider determine the best treatment. Your health care provider will check your stool for blood and look for other causes of blood loss.  TREATMENT  Treatment varies depending on the cause of the anemia. Treatment can include:   Supplements of iron, vitamin Q65, or folic acid.   Hormone medicines.   A blood transfusion. This may be needed if blood loss is severe.   Hospitalization. This may be needed if there is significant continual blood loss.   Dietary changes.  Spleen removal. HOME CARE INSTRUCTIONS Keep all follow-up appointments. It often takes many weeks to correct anemia, and having your health care provider check on your condition and your response to  treatment is very important. SEEK IMMEDIATE MEDICAL CARE IF:   You develop extreme weakness, shortness of breath, or chest pain.   You become dizzy or have trouble concentrating.  You develop heavy vaginal bleeding.   You develop a rash.   You have bloody or black, tarry stools.   You faint.   You vomit up blood.   You vomit repeatedly.   You have abdominal pain.  You have a fever or persistent symptoms for more than 2 3 days.   You have a fever and your symptoms suddenly get worse.   You are dehydrated.  MAKE SURE YOU:  Understand these instructions.  Will watch your condition.  Will get help right away if you are not doing well or get worse. Document Released: 01/02/2005 Document Revised: 07/28/2013 Document Reviewed: 05/21/2013 [facility] Patient Information 2014 [place]. Thrombocytopenia Thrombocytopenia is a condition in which there is an abnormally small number of platelets in your blood. Platelets are also called thrombocytes. Platelets are needed for blood clotting. CAUSES Thrombocytopenia is caused by:   Decreased production of platelets. This can be caused by:  Aplastic anemia in which your bone marrow quits making blood cells.  Cancer in the bone marrow.  Use of certain medicines, including chemotherapy.  Infection in the bone marrow.  Heavy alcohol consumption.  Increased destruction of platelets. This can be caused by:  Certain immune diseases.  Use of certain drugs.  Certain blood clotting disorders.  Certain inherited disorders.  Certain bleeding disorders.  Pregnancy.  Having an enlarged spleen (hypersplenism). In hypersplenism, the spleen gathers up platelets from circulation. This means the platelets are not available to  help with blood clotting. The spleen can enlarge due to cirrhosis or other conditions. SYMPTOMS  The symptoms of thrombocytopenia are side effects of poor blood clotting. Some of these  are:  Abnormal bleeding.  Nosebleeds.  Heavy menstrual periods.  Blood in the urine or stools.  Purpura. This is a purplish discoloration in the skin produced by small bleeding vessels near the surface of the skin.  Bruising.  A rash that may be petechial. This looks like pinpoint, purplish-red spots on the skin and mucous membranes. It is caused by bleeding from small blood vessels (capillaries). DIAGNOSIS  Your caregiver will make this diagnosis based on your exam and blood tests. Sometimes, a bone marrow study is done to look for the original cells (megakaryocytes) that make platelets. TREATMENT  Treatment depends on the cause of the condition.  Medicines may be given to help protect your platelets from being destroyed.  In some cases, a replacement (transfusion) of platelets may be required to stop or prevent bleeding.  Sometimes, the spleen must be surgically removed. HOME CARE INSTRUCTIONS   Check the skin and linings inside your mouth for bruising or bleeding as directed by your caregiver.  Check your sputum, urine, and stool for blood as directed by your caregiver.  Do not return to any activities that could cause bumps or bruises until your caregiver says it is okay.  Take extra care not to cut yourself when shaving or when using scissors, needles, knives, and other tools.  Take extra care not to burn yourself when ironing or cooking.  Ask your caregiver if it is okay for you to drink alcohol.  Only take over-the-counter or prescription medicines as directed by your caregiver.  Notify all your caregivers, including dentists and eye doctors, about your condition. SEEK IMMEDIATE MEDICAL CARE IF:   You develop active bleeding from anywhere in your body.  You develop unexplained bruising or bleeding.  You have blood in your sputum, urine, or stool. MAKE SURE YOU:  Understand these instructions.  Will watch your condition.  Will get help right away if you  are not doing well or get worse. Document Released: 11/25/2005 Document Revised: 02/17/2012 Document Reviewed: 09/27/2011 [facility] Patient Information 2014 [place]. Insomnia Insomnia is frequent trouble falling and/or staying asleep. Insomnia can be a long term problem or a short term problem. Both are common. Insomnia can be a short term problem when the wakefulness is related to a certain stress or worry. Long term insomnia is often related to ongoing stress during waking hours and/or poor sleeping habits. Overtime, sleep deprivation itself can make the problem worse. Every little thing feels more severe because you are overtired and your ability to cope is decreased. CAUSES   Stress, anxiety, and depression.  Poor sleeping habits.  Distractions such as TV in the bedroom.  Naps close to bedtime.  Engaging in emotionally charged conversations before bed.  Technical reading before sleep.  Alcohol and other sedatives. They may make the problem worse. They can hurt normal sleep patterns and normal dream activity.  Stimulants such as caffeine for several hours prior to bedtime.  Pain syndromes and shortness of breath can cause insomnia.  Exercise late at night.  Changing time zones may cause sleeping problems (jet lag). It is sometimes helpful to have someone observe your sleeping patterns. They should look for periods of not breathing during the night (sleep apnea). They should also look to see how long those periods last. If you live alone or observers  are uncertain, you can also be observed at a sleep clinic where your sleep patterns will be professionally monitored. Sleep apnea requires a checkup and treatment. Give your caregivers your medical history. Give your caregivers observations your family has made about your sleep.  SYMPTOMS   Not feeling rested in the morning.  Anxiety and restlessness at bedtime.  Difficulty falling and staying asleep. TREATMENT   Your  caregiver may prescribe treatment for an underlying medical disorders. Your caregiver can give advice or help if you are using alcohol or other drugs for self-medication. Treatment of underlying problems will usually eliminate insomnia problems.  Medications can be prescribed for short time use. They are generally not recommended for lengthy use.  Over-the-counter sleep medicines are not recommended for lengthy use. They can be habit forming.  You can promote easier sleeping by making lifestyle changes such as:  Using relaxation techniques that help with breathing and reduce muscle tension.  Exercising earlier in the day.  Changing your diet and the time of your last meal. No night time snacks.  Establish a regular time to go to bed.  Counseling can help with stressful problems and worry.  Soothing music and white noise may be helpful if there are background noises you cannot remove.  Stop tedious detailed work at least one hour before bedtime. HOME CARE INSTRUCTIONS   Keep a diary. Inform your caregiver about your progress. This includes any medication side effects. See your caregiver regularly. Take note of:  Times when you are asleep.  Times when you are awake during the night.  The quality of your sleep.  How you feel the next day. This information will help your caregiver care for you.  Get out of bed if you are still awake after 15 minutes. Read or do some quiet activity. Keep the lights down. Wait until you feel sleepy and go back to bed.  Keep regular sleeping and waking hours. Avoid naps.  Exercise regularly.  Avoid distractions at bedtime. Distractions include watching television or engaging in any intense or detailed activity like attempting to balance the household checkbook.  Develop a bedtime ritual. Keep a familiar routine of bathing, brushing your teeth, climbing into bed at the same time each night, listening to soothing music. Routines increase the success  of falling to sleep faster.  Use relaxation techniques. This can be using breathing and muscle tension release routines. It can also include visualizing peaceful scenes. You can also help control troubling or intruding thoughts by keeping your mind occupied with boring or repetitive thoughts like the old concept of counting sheep. You can make it more creative like imagining planting one beautiful flower after another in your backyard garden.  During your day, work to eliminate stress. When this is not possible use some of the previous suggestions to help reduce the anxiety that accompanies stressful situations. MAKE SURE YOU:   Understand these instructions.  Will watch your condition.  Will get help right away if you are not doing well or get worse. Document Released: 11/22/2000 Document Revised: 02/17/2012 Document Reviewed: 12/23/2007 [facility] Patient Information 2014 [place].

## 2013-12-24 NOTE — Telephone Encounter (Signed)
appts made and printed...td 

## 2013-12-26 DIAGNOSIS — N6019 Diffuse cystic mastopathy of unspecified breast: Secondary | ICD-10-CM | POA: Insufficient documentation

## 2013-12-26 DIAGNOSIS — G4700 Insomnia, unspecified: Secondary | ICD-10-CM | POA: Insufficient documentation

## 2013-12-26 NOTE — Progress Notes (Signed)
[facility] OFFICE PROGRESS NOTE  No primary provider on file. No primary provider on file.  DIAGNOSIS: Thrombocytopenia, unspecified - Plan: CBC with Differential, Comprehensive metabolic panel (Cmet) - [facility], Lactate dehydrogenase (LDH) - [facility], CBC with Differential in 1 month, CBC with Differential in 2 months  Anemia, unspecified - Plan: CBC with Differential, Comprehensive metabolic panel (Cmet) - [facility], Lactate dehydrogenase (LDH) - [facility], CBC with Differential in 1 month, CBC with Differential in 2 months, Ferritin, Iron and TIBC  Sickle cell trait  Tobacco abuse  Insomnia  Breast fibroadenoma  Chief Complaint  Patient presents with  . Thrombocytopenia, unspecified    CURRENT THERAPY: Ferrous sulfate 325 mg daily.   INTERVAL HISTORY: Tina Mayo 43 y.o. female with a history ongoing tobacco abuse, bipolar, thrombocytopenia is here for follow up.  She was last seen by me on 09/21/2013.   Initially, she presented at [facility] with chest pain on day of admission on 09/17 which she described as "tooth ache in my chest". A few days prior to this admission, she reported that she had a "charlie horse" in my right leg. In the ER, the EKG and cardiac markers were negative. CT angiogram of the chest was done which was negative for PE but demonstrated an unusual appearance of the lower lobes of the lungs bilaterally where there is mild diffuse ground-glass attenuation concerning alveolar hemorrhage or infection. She denied hemoptysis. Of note, the image also showed scarring in the lung bases bilaterally and some borderline enlarged and minimally enlarged bilateral hilar lymph nodes which are presumably reactive with mild cardiomegaly. Given the high concern for PE with preceding right calf pain, she was given a dose of Lovenox. Dopplers were also negative.   Hematology was consulted and we felt she had low probability for heparin induced thrombocytopenia.   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 She denied blood transfusions or recent hospitalizations. She was not aware of a prior complete blood count or being told she have low platelets. She denied melena or hematochezia. She denies gingival bleeding.  She was instructed follow-up with our offices due to her low platelets upon discharge.  We saw her on 09/21/2013 and her plts were stable.    Today, she reports doing well overall.  Her last mestrual cycle ended last Tuesday.  She reports difficulty sleeping despite taking benadryl prn.  Last week she had a little nose bleed that stopped spontaneously.  She had a screening mammogram on 11/11/2013 with masses detected in both breast.  A follow-up diagnostic mammogram was done on 11/26/2013 that revealed a right breast mass at 930 6 cm from the nipple, favored to  represent a fibroadenoma and a small left breast mass at 12 o'clock, likely a small cyst or fibroadenoma. An ultrasound-guided biopsy of the right breast mass at 930 was recommended and done on 12/06/2013 revealing benign cyst and adjacent stromal fibrosis without atypia or malignancy.  A six-month followup left breast mammogram and ultrasound is recommended to demonstrate stability of the probably benign left breast mass, likely a small cyst or fibroadenoma  MEDICAL HISTORY: Past Medical History  Diagnosis Date  . Depression   . Anxiety   . Asthma     INTERIM HISTORY: has Chest pain; Thrombocytopenia, unspecified; Cardiomegaly; Sickle cell trait; Abnormal CT of the chest; Depression; Hypokalemia; Anemia, unspecified; Tobacco abuse; Insomnia; and Cystic breast on her  problem list.    ALLERGIES:  is allergic to levaquin.  MEDICATIONS: has a current medication list which includes the following prescription(s): albuterol, ferrous sulfate, hydrocodone-acetaminophen, and ibuprofen.  SURGICAL HISTORY:  Past  Surgical History  Procedure Laterality Date  . Right ankle surgery      2010  . Tubal ligation    . Benign breast tumor removed      2006    REVIEW OF SYSTEMS:   Constitutional: Denies fevers, chills or abnormal weight loss Eyes: Denies blurriness of vision Ears, nose, mouth, throat, and face: Denies mucositis or sore throat Respiratory: Denies cough, dyspnea or wheezes Cardiovascular: Denies palpitation, chest discomfort or lower extremity swelling Gastrointestinal:  Denies nausea, heartburn or change in bowel habits Skin: Denies abnormal skin rashes;positive for right breast lump removed that was benign Hematologic/lymphatic: negative for bleeding, easy bruising, lymphadenopathy and petechiae Neurological:Denies numbness, tingling or new weaknesses Behavioral/Psych: Mood is stable, no new changes  All other systems were reviewed with the patient and are negative.  PHYSICAL EXAMINATION: ECOG PERFORMANCE STATUS: 0 - Asymptomatic  Blood pressure 111/66, pulse 76, temperature 98.1 F (36.7 C), temperature source Oral, resp. rate 18, height 5' 7"  (1.702 m), weight 195 lb 11.2 oz (88.769 kg).  GENERAL:alert, no distress and comfortable; well developed and well nourished SKIN: skin color, texture, turgor are normal, no rashes or significant lesions EYES: normal, Conjunctiva are pink and non-injected, sclera clear OROPHARYNX:no exudate, no erythema and lips, buccal mucosa, and tongue normal  NECK: supple, thyroid normal size, non-tender, without nodularity LYMPH:  no palpable lymphadenopathy in the cervical, axillary or supraclavicular LUNGS: clear to auscultation and percussion with normal breathing effort HEART: regular rate & rhythm and no murmurs and no lower extremity edema ABDOMEN:abdomen soft, non-tender and normal bowel sounds; No organomegaly.  Musculoskeletal:no cyanosis of digits and no clubbing NEURO: alert & oriented x 3 with fluent speech, no focal motor/sensory  deficits  LABORATORY DATA: Results for orders placed in visit on 12/24/13 (from the past 48 hour(s))  CBC WITH DIFFERENTIAL     Status: Abnormal   Collection Time    12/24/13  2:21 PM      Result Value Range   WBC 8.3  3.9 - 10.3 10e3/uL   NEUT# 5.6  1.5 - 6.5 10e3/uL   HGB 11.1 (*) 11.6 - 15.9 g/dL   HCT 32.5 (*) 34.8 - 46.6 %   Platelets 106 (*) 145 - 400 10e3/uL   MCV 83.8  79.5 - 101.0 fL   MCH 28.6  25.1 - 34.0 pg   MCHC 34.2  31.5 - 36.0 g/dL   RBC 3.88  3.70 - 5.45 10e6/uL   RDW 15.5 (*) 11.2 - 14.5 %   lymph# 2.1  0.9 - 3.3 10e3/uL   MONO# 0.4  0.1 - 0.9 10e3/uL   Eosinophils Absolute 0.2  0.0 - 0.5 10e3/uL   Basophils Absolute 0.1  0.0 - 0.1 10e3/uL   NEUT% 66.9  38.4 - 76.8 %   LYMPH% 24.7  14.0 - 49.7 %   MONO% 4.8  0.0 - 14.0 %   EOS% 2.9  0.0 - 7.0 %   BASO% 0.7  0.0 - 2.0 %  IRON AND TIBC [facility]     Status: Abnormal   Collection Time    12/24/13  2:21 PM      Result Value Range   Iron 44  41 - 142 ug/dL   TIBC 301  236 - 444 ug/dL   UIBC 257  120 - 384 ug/dL   %SAT 15 (*) 21 - 57 %  FERRITIN [facility]     Status: None   Collection Time    12/24/13  2:21 PM      Result Value Range   Ferritin 19  9 - 269 ng/ml  COMPREHENSIVE METABOLIC PANEL (ST41)     Status: Abnormal   Collection Time    12/24/13  2:21 PM      Result Value Range   Sodium 140  136 - 145 mEq/L   Potassium 3.8  3.5 - 5.1 mEq/L   Chloride 107  98 - 109 mEq/L   CO2 25  22 - 29 mEq/L   Glucose 88  70 - 140 mg/dl   BUN 5.8 (*) 7.0 - 26.0 mg/dL   Creatinine 0.8  0.6 - 1.1 mg/dL   Total Bilirubin 0.75  0.20 - 1.20 mg/dL   Alkaline Phosphatase 78  40 - 150 U/L   AST 14  5 - 34 U/L   ALT 10  0 - 55 U/L   Total Protein 7.2  6.4 - 8.3 g/dL   Albumin 3.8  3.5 - 5.0 g/dL   Calcium 8.8  8.4 - 10.4 mg/dL   Anion Gap 8  3 - 11 mEq/L       Chemistry      Component Value Date/Time   NA 140 12/24/2013 1421   NA 139 10/24/2013 1124   K 3.8 12/24/2013 1421   K 4.2 10/24/2013 1124   CL 105 10/24/2013  1124   CO2 25 12/24/2013 1421   CO2 24 10/24/2013 1124   BUN 5.8* 12/24/2013 1421   BUN 7 10/24/2013 1124   CREATININE 0.8 12/24/2013 1421   CREATININE 0.75 10/24/2013 1124      Component Value Date/Time   CALCIUM 8.8 12/24/2013 1421   CALCIUM 9.1 10/24/2013 1124   ALKPHOS 78 12/24/2013 1421   ALKPHOS 66 08/26/2013 0735   AST 14 12/24/2013 1421   AST 13 08/26/2013 0735   ALT 10 12/24/2013 1421   ALT 16 08/26/2013 0735   BILITOT 0.75 12/24/2013 1421   BILITOT 0.8 08/26/2013 0735     Basic Metabolic Panel:  Recent Labs Lab 12/24/13 1421  NA 140  K 3.8  CO2 25  GLUCOSE 88  BUN 5.8*  CREATININE 0.8  CALCIUM 8.8   GFR Estimated Creatinine Clearance: 102.7 ml/min (by C-G formula based on Cr of 0.8). Liver Function Tests:  Recent Labs Lab 12/24/13 1421  AST 14  ALT 10  ALKPHOS 78  BILITOT 0.75  PROT 7.2  ALBUMIN 3.8   CBC:  Recent Labs Lab 12/24/13 1421  WBC 8.3  NEUTROABS 5.6  HGB 11.1*  HCT 32.5*  MCV 83.8  PLT 106*   Results for Tina, Mayo (MRN [id number]) as of 09/21/2013 23:07  Ref. Range 08/26/2013 16:14  Hep A IgM Latest Range: NEGATIVE  NEGATIVE  Hepatitis B Surface Ag Latest Range: NEGATIVE  NEGATIVE  Hep B C IgM Latest Range: NEGATIVE  NEGATIVE  HCV Ab Latest Range: NEGATIVE  NEGATIVE   Results for Tina, Mayo (MRN [id number]) as of 09/21/2013 23:07  Ref. Range 08/26/2013 11:20  TSH Latest Range: 0.350-4.500 uIU/mL 1.580   Iron/TIBC/Ferritin    Component Value Date/Time   IRON 44 12/24/2013 1421   IRON 92 08/26/2013 0735   TIBC 301 12/24/2013 1421   TIBC 254 08/26/2013 0735   FERRITIN 19 12/24/2013 1421   FERRITIN 19 08/26/2013 0735   PATHOLOGY: 12/06/2013  Breast, right, needle core biopsy, mass, 9:30 o'clock - BENIGN CYST AND ADJACENT STROMAL FIBROSIS. - NO ATYPIA OR MALIGNANCY.  RADIOGRAPHIC STUDIES: Ultrasound of abdomen: 12/20/2013 ULTRASOUND ABDOMEN COMPLETE COMPARISON: None.  FINDINGS: Gallbladder: There are cholelithiasis. There  is no pericholecystic fluid, gallbladder wall thickening or sonographic Murphy sign. Common bile duct: Diameter: 4.4 mm Liver: No focal lesion identified. Within normal limits in parenchymal echogenicity.  IVC: No abnormality visualized. Pancreas: Visualized portion unremarkable. Spleen: Size and appearance within normal limits.  Right Kidney: Length: 10.6 cm. Echogenicity within normal limits. No mass or hydronephrosis visualized. Left Kidney:  Length: 10.9 cm. Echogenicity within normal limits. No mass or  hydronephrosis visualized. Abdominal aorta: No aneurysm visualized.  Other findings: None. IMPRESSION: 1. Cholelithiasis without acute cholecystitis. 2. Normal liver and spleen.   Ct Angio Chest Pe W/cm &/or Wo Cm  08/26/2013   CLINICAL DATA:  Left-sided chest pain.  EXAM: CT ANGIOGRAPHY CHEST WITH CONTRAST  TECHNIQUE: Multidetector CT imaging of the chest was performed using the standard protocol during bolus administration of intravenous contrast. Multiplanar CT image reconstructions including MIPs were obtained to evaluate the vascular anatomy.  CONTRAST:  149m OMNIPAQUE IOHEXOL 350 MG/ML SOLN  COMPARISON:  No priors.  FINDINGS: Mediastinum: There are no filling defects within the pulmonary arterial tree to suggest underlying pulmonary embolism. Heart size is borderline enlarged. There is no significant pericardial fluid, thickening or pericardial calcification. No pathologically enlarged mediastinal lymph nodes. Multiple borderline enlarged and mildly enlarged bilateral hilar lymph nodes are noted, largest of which measure 1 cm on the right and 1.1 cm on the left in short axis. Esophagus is unremarkable in appearance.  Lungs/Pleura: Diffuse ground-glass attenuation throughout the lower lobes of the lungs bilaterally is nonspecific, but can be seen in the setting of alveolar hemorrhage or developing infection. There are linear areas of architectural distortion in the lower lobes of the lungs  bilaterally, which may represent some post infectious or inflammatory scarring. Multiple small thin-walled cysts are noted scattered throughout the lungs bilaterally, which are nonspecific. Trace left pleural effusion layering dependently. No definite suspicious appearing pulmonary nodule or masses identified at this time.  Upper Abdomen: Numerous calcified gallstones within the gallbladder. Gallbladder is incompletely visualized.  Musculoskeletal: There are no aggressive appearing lytic or blastic lesions noted in the visualized portions of the skeleton.  Review of the MIP images confirms the above findings.  IMPRESSION: 1. No evidence of pulmonary embolism. 2. Unusual appearance of the lower lobes of the lungs bilaterally where there is mild diffuse ground-glass attenuation. This can be seen in the setting of alveolar hemorrhage (only likely if the patient has a recent history of hemoptysis) or developing infection. In addition, there is a trace left pleural effusion and there are areas of scarring in the lung bases bilaterally. Notably, there is also some borderline enlarged and minimally enlarged bilateral hilar lymph nodes which are presumably reactive. 3. Mild cardiomegaly.   Electronically Signed   By: DVinnie LangtonM.D.   On: 08/26/2013 00:43    ASSESSMENT: TDelia Chimes89y.o. female with a history of Thrombocytopenia, unspecified - Plan: CBC with Differential, Comprehensive metabolic panel (Cmet) - [facility], Lactate dehydrogenase (LDH) - [facility], CBC with Differential in 1 month, CBC with Differential in 2 months  Anemia, unspecified - Plan: CBC with Differential, Comprehensive metabolic panel (Cmet) - [facility], Lactate dehydrogenase (LDH) - [facility], CBC with Differential in 1 month, CBC with Differential in 2 months, Ferritin, Iron and TIBC  Sickle cell trait  Tobacco abuse  Insomnia  Breast fibroadenoma  I reviewed the smear doing her hospital admission and it was without evidence of fragmented  red blood cells. Plts were low without evidence of clumping. Differential diagnosis for thrombocytopenia included a low grade DIC secondary to infections given ct of chest findings versus ITP or other systemic or bone marrow disorders. We did not have a baseline complete blood count to establish chronicity. HIV, Hepatitis panel was negative. TSH, WNL.  I could not palpate a spleen, lymphadenopathy and she does not have stigmata of liver disease. She denied alcohol use.   PLAN:  1. Thrombocytopenia of unclear etiology. --Her platlets are stable at 106 up from 99 on her last visit.   She denies any episodes of bleeding besides a brief epistaxis as noted in HPI.  We checked an abdominal ultrasound to rule of splenomegaly as noted above.  She did not appear to be hemolyzing on laboratory studies performed during her recent hospitalization.  We provided a detailed handout on thrombocytopenia.  Might consider screening for autoimmune disorders with ANA, ESR.  2. Anemia, microcytic. -Mildly low ferritin of 19 suggestive of mild iron deficiency.  We continue ferrous sulfate 325 mg daily and repeat labs monthly.  Her hemoglobin is stable at 11.1. Likely due to menstrual bleeding.   3. Scarring of the lung bases bilaterally/Ground glass appearance.  --She does endorse a history of crack cocaine use.  Not likely infection, given normal white blood count and denial of symptoms includes fevers or cough.   If more sympthomatic, we will need to repeat her scans of the chest in six months.   4. Tobacco abuse. - Counseled extensively on the need to stop smoking.  She declined tobacco cessation classes.     5. Sickle cell trait -- Patient denies any painful crisis episodes.    6. Left/ Right breast cysts (11/2013). --She had a screening mammogram on 11/11/2013 with masses detected in both breast.  A follow-up diagnostic mammogram was done on 11/26/2013 that revealed a right breast mass at 9:30 6 cm from the nipple,  favored to represent a fibroadenoma and a small left breast mass at 12 o'clock, likely a small cyst or fibroadenoma.  --An ultrasound-guided biopsy of the right breast mass at 9:30 was recommended and done on 12/06/2013 revealing benign cyst and adjacent stromal fibrosis without atypia or malignancy.   --A six-month followup left breast mammogram and ultrasound is recommended to demonstrate stability of the probably benign left breast mass, likely a small cyst or fibroadenoma. Her follow-up with 04/2013.   7. Insomnia. --Counseled patient on good sleep hygiene including no naps doing the day and avoidance caffeinated beverages in the afternoon.    8. Follow-up.   --She repeat her CBC, iron studies in monthly for iron studies and CBC, iron studies and chemistrities upon her 3 month follow-up.   All questions were answered. The patient knows to call the clinic with any problems, questions or concerns. We can certainly see the patient much sooner if necessary.  I spent 15 minutes counseling the patient face to face. The total time spent in the appointment was 25 minutes.    Tina Bencosme, MD 12/26/2013 11:08 AM

## 2013-12-29 ENCOUNTER — Encounter (HOSPITAL_COMMUNITY): Payer: Self-pay | Admitting: Emergency Medicine

## 2013-12-29 DIAGNOSIS — F3289 Other specified depressive episodes: Secondary | ICD-10-CM | POA: Insufficient documentation

## 2013-12-29 DIAGNOSIS — F17200 Nicotine dependence, unspecified, uncomplicated: Secondary | ICD-10-CM | POA: Insufficient documentation

## 2013-12-29 DIAGNOSIS — R079 Chest pain, unspecified: Secondary | ICD-10-CM | POA: Insufficient documentation

## 2013-12-29 DIAGNOSIS — J45909 Unspecified asthma, uncomplicated: Secondary | ICD-10-CM | POA: Insufficient documentation

## 2013-12-29 DIAGNOSIS — Z79899 Other long term (current) drug therapy: Secondary | ICD-10-CM | POA: Insufficient documentation

## 2013-12-29 DIAGNOSIS — F411 Generalized anxiety disorder: Secondary | ICD-10-CM | POA: Insufficient documentation

## 2013-12-29 DIAGNOSIS — F329 Major depressive disorder, single episode, unspecified: Secondary | ICD-10-CM | POA: Insufficient documentation

## 2013-12-29 NOTE — ED Notes (Signed)
The pt is c/o mid-chest pain since 2230 .  The pain started as she was attempting to go to sleep.  She has a history of the same no cardiac history.  Hyperventilating at present

## 2013-12-30 ENCOUNTER — Emergency Department (HOSPITAL_COMMUNITY): Payer: Medicaid Other

## 2013-12-30 ENCOUNTER — Emergency Department (HOSPITAL_COMMUNITY)
Admission: EM | Admit: 2013-12-30 | Discharge: 2013-12-30 | Disposition: A | Discharge status: Home / Self Care | Payer: Medicaid Other | Attending: Emergency Medicine | Admitting: Emergency Medicine

## 2013-12-30 DIAGNOSIS — R079 Chest pain, unspecified: Secondary | ICD-10-CM

## 2013-12-30 LAB — COMPREHENSIVE METABOLIC PANEL
ALK PHOS: 93 U/L (ref 39–117)
ALT: 30 U/L (ref 0–35)
AST: 100 U/L — ABNORMAL HIGH (ref 0–37)
Albumin: 3.6 g/dL (ref 3.5–5.2)
BILIRUBIN TOTAL: 0.9 mg/dL (ref 0.3–1.2)
BUN: 6 mg/dL (ref 6–23)
CHLORIDE: 107 meq/L (ref 96–112)
CO2: 23 mEq/L (ref 19–32)
Calcium: 8.6 mg/dL (ref 8.4–10.5)
Creatinine, Ser: 0.66 mg/dL (ref 0.50–1.10)
GFR calc Af Amer: >90 mL/min (ref >90)
GFR calc non Af Amer: >90 mL/min (ref >90)
GLUCOSE: 114 mg/dL — AB (ref 70–99)
POTASSIUM: 3.5 meq/L — AB (ref 3.7–5.3)
SODIUM: 143 meq/L (ref 137–147)
Total Protein: 7.3 g/dL (ref 6.0–8.3)

## 2013-12-30 LAB — CBC WITH DIFFERENTIAL/PLATELET
Basophils Absolute: 0 10*3/uL (ref 0.0–0.1)
Basophils Relative: 0 % (ref 0–1)
Eosinophils Absolute: 0.2 10*3/uL (ref 0.0–0.7)
Eosinophils Relative: 2 % (ref 0–5)
HCT: 31.1 % — ABNORMAL LOW (ref 36.0–46.0)
HEMOGLOBIN: 11.5 g/dL — AB (ref 12.0–15.0)
LYMPHS ABS: 2.1 10*3/uL (ref 0.7–4.0)
LYMPHS PCT: 23 % (ref 12–46)
MCH: 28.8 pg (ref 26.0–34.0)
MCHC: 37 g/dL — ABNORMAL HIGH (ref 30.0–36.0)
MCV: 77.9 fL — ABNORMAL LOW (ref 78.0–100.0)
Monocytes Absolute: 0.6 10*3/uL (ref 0.1–1.0)
Monocytes Relative: 6 % (ref 3–12)
NEUTROS PCT: 69 % (ref 43–77)
Neutro Abs: 6.3 10*3/uL (ref 1.7–7.7)
PLATELETS: 94 10*3/uL — AB (ref 150–400)
RBC: 3.99 MIL/uL (ref 3.87–5.11)
RDW: 14.8 % (ref 11.5–15.5)
WBC: 9.2 10*3/uL (ref 4.0–10.5)

## 2013-12-30 LAB — LIPASE, BLOOD: LIPASE: 93 U/L — AB (ref 11–59)

## 2013-12-30 LAB — TROPONIN I

## 2013-12-30 NOTE — ED Notes (Signed)
Patient transported to X-ray 

## 2013-12-30 NOTE — ED Provider Notes (Signed)
CSN: [id number]     Arrival date & time 12/29/13  2316 History   First MD Initiated Contact with Patient 12/30/13 [phone]     Chief Complaint  Patient presents with  . Chest Pain   (Consider location/radiation/quality/duration/timing/severity/associated sxs/prior Treatment) HPI This patient is a 43 year old woman who presents after experiencing an episode of epigastric and centrally located chest pain. This happened after she ate dinner. It was associated with belching. It was a sharp feeling. Her symptoms have since resolved entirely. She denies nausea and vomiting. She has not any shortness of breath, cough or fever. She describes her pain as moderate in its greatest severity. Again, she is pain-free at this time.  Chart review shows the patient has been worked up recently with a CT angiogram of the chest which was negative for pulmonary embolus.  Past Medical History  Diagnosis Date  . Depression   . Anxiety   . Asthma    Past Surgical History  Procedure Laterality Date  . Right ankle surgery      2010  . Tubal ligation    . Benign breast tumor removed      2006   Family History  Problem Relation Age of Onset  . Breast cancer Mother   . Cancer Mother   . Diabetes Mellitus II Father   . Diabetes Father    History  Substance Use Topics  . Smoking status: Current Every Day Smoker -- 0.50 packs/day    Types: Cigarettes  . Smokeless tobacco: Current User  . Alcohol Use: No   OB History   Grav Para Term Preterm Abortions TAB SAB Ect Mult Living                 Review of Systems Ten point review of symptoms performed and is negative with the exception of symptoms noted above.   Allergies  Levaquin  Home Medications   Current Outpatient Rx  Name  Route  Sig  Dispense  Refill  . albuterol (PROVENTIL HFA;VENTOLIN HFA) 108 (90 BASE) MCG/ACT inhaler   Inhalation   Inhale 2 puffs into the lungs every 4 (four) hours as needed for wheezing or shortness of breath.   1  Inhaler   3   . CALCIUM PO   Oral   Take 1 tablet by mouth daily.         . DiphenhydrAMINE HCl, Sleep, (ZZZQUIL) 25 MG CAPS   Oral   Take 25 mg by mouth at bedtime as needed (sleep).         . ferrous fumarate (HEMOCYTE - 106 MG FE) 325 (106 FE) MG TABS tablet   Oral   Take 1 tablet by mouth 2 (two) times daily.         Marland Kitchen ibuprofen (ADVIL,MOTRIN) 800 MG tablet   Oral   Take 800 mg by mouth every 8 (eight) hours as needed for moderate pain.          BP 105/71  Pulse 70  Temp(Src) 98.1 F (36.7 C) (Oral)  Resp 14  SpO2 98%  LMP 12/23/2013 Physical Exam Gen: well developed and well nourished appearing Head: NCAT Eyes: PERL, EOMI Nose: no epistaixis or rhinorrhea Mouth/throat: mucosa is moist and pink Neck: supple, no stridor Lungs: CTA B, no wheezing, rhonchi or rales CV: RRR, no murmur, extremities appear well perfused.  Abd: soft, notender, nondistended Back: no ttp, no cva ttp Skin: warm and dry Ext: normal to inspection, no dependent edema Neuro: CN ii-xii grossly intact,  no focal deficits Psyche; normal affect,  calm and cooperative.   ED Course  Procedures (including critical care time) Labs Review Results for orders placed during the hospital encounter of 12/30/13 (from the past 24 hour(s))  CBC WITH DIFFERENTIAL     Status: Abnormal   Collection Time    12/29/13 11:43 PM      Result Value Range   WBC 9.2  4.0 - 10.5 K/uL   RBC 3.99  3.87 - 5.11 MIL/uL   Hemoglobin 11.5 (*) 12.0 - 15.0 g/dL   HCT 31.1 (*) 36.0 - 46.0 %   MCV 77.9 (*) 78.0 - 100.0 fL   MCH 28.8  26.0 - 34.0 pg   MCHC 37.0 (*) 30.0 - 36.0 g/dL   RDW 14.8  11.5 - 15.5 %   Platelets 94 (*) 150 - 400 K/uL   Neutrophils Relative % 69  43 - 77 %   Neutro Abs 6.3  1.7 - 7.7 K/uL   Lymphocytes Relative 23  12 - 46 %   Lymphs Abs 2.1  0.7 - 4.0 K/uL   Monocytes Relative 6  3 - 12 %   Monocytes Absolute 0.6  0.1 - 1.0 K/uL   Eosinophils Relative 2  0 - 5 %   Eosinophils Absolute  0.2  0.0 - 0.7 K/uL   Basophils Relative 0  0 - 1 %   Basophils Absolute 0.0  0.0 - 0.1 K/uL  COMPREHENSIVE METABOLIC PANEL     Status: Abnormal   Collection Time    12/29/13 11:43 PM      Result Value Range   Sodium 143  137 - 147 mEq/L   Potassium 3.5 (*) 3.7 - 5.3 mEq/L   Chloride 107  96 - 112 mEq/L   CO2 23  19 - 32 mEq/L   Glucose, Bld 114 (*) 70 - 99 mg/dL   BUN 6  6 - 23 mg/dL   Creatinine, Ser 0.66  0.50 - 1.10 mg/dL   Calcium 8.6  8.4 - 10.5 mg/dL   Total Protein 7.3  6.0 - 8.3 g/dL   Albumin 3.6  3.5 - 5.2 g/dL   AST 100 (*) 0 - 37 U/L   ALT 30  0 - 35 U/L   Alkaline Phosphatase 93  39 - 117 U/L   Total Bilirubin 0.9  0.3 - 1.2 mg/dL   GFR calc non Af Amer >90  >90 mL/min   GFR calc Af Amer >90  >90 mL/min  TROPONIN I     Status: None   Collection Time    12/29/13 11:44 PM      Result Value Range   Troponin I <0.30  <0.30 ng/mL  LIPASE, BLOOD     Status: Abnormal   Collection Time    12/29/13 11:44 PM      Result Value Range   Lipase 93 (*) 11 - 59 U/L   CXR: normal cardiac silloute, normal appearing mediastinum, no infiltrates, no acute process identified.   EKG: nsr, no acute ischemic changes, normal intervals, normal axis, normal qrs complex  MDM  ED work up non-diagnostic. Patient is pain free with benign exam and normal VS. Asymptomatic and stable for d/c.     Elyn Peers, MD 12/30/13 [phone]

## 2013-12-30 NOTE — Discharge Instructions (Signed)
Chest Pain (Nonspecific) °It is often hard to give a specific diagnosis for the cause of chest pain. There is always a chance that your pain could be related to something serious, such as a heart attack or a blood clot in the lungs. You need to follow up with your caregiver for further evaluation. °CAUSES  °· Heartburn. °· Pneumonia or bronchitis. °· Anxiety or stress. °· Inflammation around your heart (pericarditis) or lung (pleuritis or pleurisy). °· A blood clot in the lung. °· A collapsed lung (pneumothorax). It can develop suddenly on its own (spontaneous pneumothorax) or from injury (trauma) to the chest. °· Shingles infection (herpes zoster virus). °The chest wall is composed of bones, muscles, and cartilage. Any of these can be the source of the pain. °· The bones can be bruised by injury. °· The muscles or cartilage can be strained by coughing or overwork. °· The cartilage can be affected by inflammation and become sore (costochondritis). °DIAGNOSIS  °Lab tests or other studies, such as X-rays, electrocardiography, stress testing, or cardiac imaging, may be needed to find the cause of your pain.  °TREATMENT  °· Treatment depends on what may be causing your chest pain. Treatment may include: °· Acid blockers for heartburn. °· Anti-inflammatory medicine. °· Pain medicine for inflammatory conditions. °· Antibiotics if an infection is present. °· You may be advised to change lifestyle habits. This includes stopping smoking and avoiding alcohol, caffeine, and chocolate. °· You may be advised to keep your head raised (elevated) when sleeping. This reduces the chance of acid going backward from your stomach into your esophagus. °· Most of the time, nonspecific chest pain will improve within 2 to 3 days with rest and mild pain medicine. °HOME CARE INSTRUCTIONS  °· If antibiotics were prescribed, take your antibiotics as directed. Finish them even if you start to feel better. °· For the next few days, avoid physical  activities that bring on chest pain. Continue physical activities as directed. °· Do not smoke. °· Avoid drinking alcohol. °· Only take over-the-counter or prescription medicine for pain, discomfort, or fever as directed by your caregiver. °· Follow your caregiver's suggestions for further testing if your chest pain does not go away. °· Keep any follow-up appointments you made. If you do not go to an appointment, you could develop lasting (chronic) problems with pain. If there is any problem keeping an appointment, you must call to reschedule. °SEEK MEDICAL CARE IF:  °· You think you are having problems from the medicine you are taking. Read your medicine instructions carefully. °· Your chest pain does not go away, even after treatment. °· You develop a rash with blisters on your chest. °SEEK IMMEDIATE MEDICAL CARE IF:  °· You have increased chest pain or pain that spreads to your arm, neck, jaw, back, or abdomen. °· You develop shortness of breath, an increasing cough, or you are coughing up blood. °· You have severe back or abdominal pain, feel nauseous, or vomit. °· You develop severe weakness, fainting, or chills. °· You have a fever. °THIS IS AN EMERGENCY. Do not wait to see if the pain will go away. Get medical help at once. Call your local emergency services (911 in [place] Do not drive yourself to the hospital. °MAKE SURE YOU:  °· Understand these instructions. °· Will watch your condition. °· Will get help right away if you are not doing well or get worse. °Document Released: 09/04/2005 Document Revised: 02/17/2012 Document Reviewed: 06/30/2008 °[employer]® Patient Information ©2014 [employer]   [employer]. ° °

## 2014-01-12 ENCOUNTER — Encounter: Payer: Self-pay | Admitting: Internal Medicine

## 2014-01-12 ENCOUNTER — Ambulatory Visit: Payer: Medicaid Other | Attending: Internal Medicine | Admitting: Internal Medicine

## 2014-01-12 VITALS — BP 105/73 | HR 79 | Temp 98.7°F | Resp 14 | Ht 67.0 in | Wt 193.0 lb

## 2014-01-12 DIAGNOSIS — F17200 Nicotine dependence, unspecified, uncomplicated: Secondary | ICD-10-CM

## 2014-01-12 DIAGNOSIS — Z720 Tobacco use: Secondary | ICD-10-CM

## 2014-01-12 DIAGNOSIS — R1013 Epigastric pain: Secondary | ICD-10-CM

## 2014-01-12 DIAGNOSIS — E876 Hypokalemia: Secondary | ICD-10-CM | POA: Insufficient documentation

## 2014-01-12 DIAGNOSIS — R079 Chest pain, unspecified: Secondary | ICD-10-CM | POA: Insufficient documentation

## 2014-01-12 DIAGNOSIS — K3189 Other diseases of stomach and duodenum: Secondary | ICD-10-CM

## 2014-01-12 MED ORDER — VARENICLINE TARTRATE 0.5 MG X 11 & 1 MG X 42 PO MISC: ORAL | Status: DC

## 2014-01-12 MED ORDER — OMEPRAZOLE 20 MG PO CPDR: 20.0000 mg | DELAYED_RELEASE_CAPSULE | Freq: Every day | ORAL | Status: DC

## 2014-01-12 NOTE — Progress Notes (Signed)
Pt is here for a f/u. Pt requests a pap smear. Will need an appointment to see Dr. Sherral Hammers. Pt denies chest pains. Complains of Rt leg pain; had a metal plate it leg, pain occurs when the weather changes. Has questions about smoking patches to quit.

## 2014-01-12 NOTE — Progress Notes (Signed)
MRN: [id number] Name: Tina Mayo  Sex: female Age: 43 y.o. DOB: December 11, 1970  Allergies: Levaquin  Chief Complaint  Patient presents with  . Follow-up    chest pain    HPI: Patient is 43 y.o. female who comes today for followup, last month she went to the emergency room with symptoms of chest pain, EMR reviewed, troponins negative, she denies any more chest pain reported to have lot of bloating and reflux symptoms, she tried over-the-counter medication without much improvement, her patient wants to quit smoking and is requesting Chantix which she used in the past and it helped her to quit smoking , also noticed she had low potassium level, as per patient she is not taking any supplements.  Past Medical History  Diagnosis Date  . Depression   . Anxiety   . Asthma     Past Surgical History  Procedure Laterality Date  . Right ankle surgery      2010  . Tubal ligation    . Benign breast tumor removed      2006      Medication List       This list is accurate as of: 01/12/14  2:21 PM.  Always use your most recent med list.               albuterol 108 (90 BASE) MCG/ACT inhaler  Commonly known as:  PROVENTIL HFA;VENTOLIN HFA  Inhale 2 puffs into the lungs every 4 (four) hours as needed for wheezing or shortness of breath.     CALCIUM PO  Take 1 tablet by mouth daily.     ferrous fumarate 325 (106 FE) MG Tabs tablet  Commonly known as:  HEMOCYTE - 106 mg FE  Take 1 tablet by mouth 2 (two) times daily.     ibuprofen 800 MG tablet  Commonly known as:  ADVIL,MOTRIN  Take 800 mg by mouth every 8 (eight) hours as needed for moderate pain.     omeprazole 20 MG capsule  Commonly known as:  PRILOSEC  Take 1 capsule (20 mg total) by mouth daily.     varenicline 0.5 MG X 11 & 1 MG X 42 tablet  Commonly known as:  CHANTIX STARTING MONTH PAK  Take one 0.5 mg tablet by mouth once daily for 3 days, then increase to one 0.5 mg tablet twice daily for 4 days, then increase to one  1 mg tablet twice daily.     ZZZQUIL 25 MG Caps  Generic drug:  DiphenhydrAMINE HCl (Sleep)  Take 25 mg by mouth at bedtime as needed (sleep).        Meds ordered this encounter  Medications  . omeprazole (PRILOSEC) 20 MG capsule    Sig: Take 1 capsule (20 mg total) by mouth daily.    Dispense:  30 capsule    Refill:  3  . varenicline (CHANTIX STARTING MONTH PAK) 0.5 MG X 11 & 1 MG X 42 tablet    Sig: Take one 0.5 mg tablet by mouth once daily for 3 days, then increase to one 0.5 mg tablet twice daily for 4 days, then increase to one 1 mg tablet twice daily.    Dispense:  53 tablet    Refill:  0     There is no immunization history on file for this patient.  Family History  Problem Relation Age of Onset  . Breast cancer Mother   . Cancer Mother   . Diabetes Mellitus II Father   . Diabetes  Father     History  Substance Use Topics  . Smoking status: Current Every Day Smoker -- 0.50 packs/day    Types: Cigarettes  . Smokeless tobacco: Current User  . Alcohol Use: No    Review of Systems  As noted in HPI  Filed Vitals:   01/12/14 1357  BP: 105/73  Pulse: 79  Temp: 98.7 F (37.1 C)  Resp: 14    Physical Exam  Physical Exam  Constitutional: No distress.  Eyes: EOM are normal. Pupils are equal, round, and reactive to light.  Cardiovascular: Normal rate and regular rhythm.   Pulmonary/Chest: Breath sounds normal. No respiratory distress. She has no wheezes. She has no rales.  Abdominal: Soft. There is no tenderness. There is no rebound.    CBC    Component Value Date/Time   WBC 9.2 12/29/2013 2343   WBC 8.3 12/24/2013 1421   RBC 3.99 12/29/2013 2343   RBC 3.88 12/24/2013 1421   RBC 3.54* 08/26/2013 0735   HGB 11.5* 12/29/2013 2343   HGB 11.1* 12/24/2013 1421   HCT 31.1* 12/29/2013 2343   HCT 32.5* 12/24/2013 1421   PLT 94* 12/29/2013 2343   PLT 106* 12/24/2013 1421   MCV 77.9* 12/29/2013 2343   MCV 83.8 12/24/2013 1421   LYMPHSABS 2.1 12/29/2013 2343    LYMPHSABS 2.1 12/24/2013 1421   MONOABS 0.6 12/29/2013 2343   MONOABS 0.4 12/24/2013 1421   EOSABS 0.2 12/29/2013 2343   EOSABS 0.2 12/24/2013 1421   BASOSABS 0.0 12/29/2013 2343   BASOSABS 0.1 12/24/2013 1421    CMP     Component Value Date/Time   NA 143 12/29/2013 2343   NA 140 12/24/2013 1421   K 3.5* 12/29/2013 2343   K 3.8 12/24/2013 1421   CL 107 12/29/2013 2343   CO2 23 12/29/2013 2343   CO2 25 12/24/2013 1421   GLUCOSE 114* 12/29/2013 2343   GLUCOSE 88 12/24/2013 1421   BUN 6 12/29/2013 2343   BUN 5.8* 12/24/2013 1421   CREATININE 0.66 12/29/2013 2343   CREATININE 0.8 12/24/2013 1421   CALCIUM 8.6 12/29/2013 2343   CALCIUM 8.8 12/24/2013 1421   PROT 7.3 12/29/2013 2343   PROT 7.2 12/24/2013 1421   ALBUMIN 3.6 12/29/2013 2343   ALBUMIN 3.8 12/24/2013 1421   AST 100* 12/29/2013 2343   AST 14 12/24/2013 1421   ALT 30 12/29/2013 2343   ALT 10 12/24/2013 1421   ALKPHOS 93 12/29/2013 2343   ALKPHOS 78 12/24/2013 1421   BILITOT 0.9 12/29/2013 2343   BILITOT 0.75 12/24/2013 1421   GFRNONAA >90 12/29/2013 2343   GFRAA >90 12/29/2013 2343    No results found for this basename: chol, tri, ldl    No components found with this basename: hga1c    Lab Results  Component Value Date/Time   AST 100* 12/29/2013 11:43 PM   AST 14 12/24/2013  2:21 PM    Assessment and Plan  Chest pain  Hypokalemia - Plan: Will check blood chemistry COMPLETE METABOLIC PANEL WITH GFR  Tobacco abuse - Plan: varenicline (CHANTIX STARTING MONTH PAK) 0.5 MG X 11 & 1 MG X 42 tablet  Dyspepsia - Plan: omeprazole (PRILOSEC) 20 MG capsule   Return in about 3 months (around 04/11/2014).  Lorayne Marek, MD

## 2014-01-24 ENCOUNTER — Other Ambulatory Visit: Payer: Medicaid Other

## 2014-02-10 ENCOUNTER — Ambulatory Visit: Payer: Medicaid Other

## 2014-02-21 ENCOUNTER — Other Ambulatory Visit: Payer: Medicaid Other

## 2014-02-22 ENCOUNTER — Other Ambulatory Visit: Payer: Self-pay | Admitting: Medical Oncology

## 2014-02-22 ENCOUNTER — Telehealth: Payer: Self-pay | Admitting: Internal Medicine

## 2014-02-22 ENCOUNTER — Other Ambulatory Visit: Payer: Self-pay

## 2014-02-22 NOTE — Telephone Encounter (Signed)
lmonvm for pt re appt for 3/18 @ 3:30pm. appt scheduled per 3/17 pof

## 2014-02-23 ENCOUNTER — Other Ambulatory Visit: Payer: Medicaid Other

## 2014-02-24 ENCOUNTER — Ambulatory Visit: Payer: Medicaid Other

## 2014-03-03 ENCOUNTER — Ambulatory Visit: Payer: Medicaid Other

## 2014-03-24 ENCOUNTER — Ambulatory Visit: Payer: Medicaid Other

## 2014-03-24 ENCOUNTER — Other Ambulatory Visit: Payer: Medicaid Other

## 2014-04-02 ENCOUNTER — Emergency Department (HOSPITAL_COMMUNITY)
Admission: EM | Admit: 2014-04-02 | Discharge: 2014-04-02 | Disposition: A | Discharge status: Home / Self Care | Payer: Medicaid Other | Attending: Emergency Medicine | Admitting: Emergency Medicine

## 2014-04-02 ENCOUNTER — Encounter (HOSPITAL_COMMUNITY): Payer: Self-pay | Admitting: Emergency Medicine

## 2014-04-02 DIAGNOSIS — F17200 Nicotine dependence, unspecified, uncomplicated: Secondary | ICD-10-CM | POA: Insufficient documentation

## 2014-04-02 DIAGNOSIS — M25469 Effusion, unspecified knee: Secondary | ICD-10-CM | POA: Insufficient documentation

## 2014-04-02 DIAGNOSIS — M25473 Effusion, unspecified ankle: Secondary | ICD-10-CM | POA: Insufficient documentation

## 2014-04-02 DIAGNOSIS — M25562 Pain in left knee: Secondary | ICD-10-CM

## 2014-04-02 DIAGNOSIS — J45909 Unspecified asthma, uncomplicated: Secondary | ICD-10-CM | POA: Insufficient documentation

## 2014-04-02 DIAGNOSIS — Z8659 Personal history of other mental and behavioral disorders: Secondary | ICD-10-CM | POA: Insufficient documentation

## 2014-04-02 DIAGNOSIS — M25569 Pain in unspecified knee: Secondary | ICD-10-CM | POA: Insufficient documentation

## 2014-04-02 DIAGNOSIS — M25476 Effusion, unspecified foot: Secondary | ICD-10-CM | POA: Insufficient documentation

## 2014-04-02 DIAGNOSIS — Z79899 Other long term (current) drug therapy: Secondary | ICD-10-CM | POA: Insufficient documentation

## 2014-04-02 MED ORDER — METHOCARBAMOL 500 MG PO TABS: 500.0000 mg | ORAL_TABLET | Freq: Two times a day (BID) | ORAL | Status: DC

## 2014-04-02 MED ORDER — IBUPROFEN 800 MG PO TABS: 800.0000 mg | ORAL_TABLET | Freq: Three times a day (TID) | ORAL | Status: DC | PRN

## 2014-04-02 NOTE — ED Notes (Signed)
Pt. reports left knee pain / swelling onset last night after work , denies injury or fall, ambulatory. No fever or chills. Pt. stated she stands long hours at work.

## 2014-04-02 NOTE — Discharge Instructions (Signed)
Arthralgia °Your caregiver has diagnosed you as suffering from an arthralgia. Arthralgia means there is pain in a joint. This can come from many reasons including: °· Bruising the joint which causes soreness (inflammation) in the joint. °· Wear and tear on the joints which occur as we grow older (osteoarthritis). °· Overusing the joint. °· Various forms of arthritis. °· Infections of the joint. °Regardless of the cause of pain in your joint, most of these different pains respond to anti-inflammatory drugs and rest. The exception to this is when a joint is infected, and these cases are treated with antibiotics, if it is a bacterial infection. °HOME CARE INSTRUCTIONS  °· Rest the injured area for as long as directed by your caregiver. Then slowly start using the joint as directed by your caregiver and as the pain allows. Crutches as directed may be useful if the ankles, knees or hips are involved. If the knee was splinted or casted, continue use and care as directed. If an stretchy or elastic wrapping bandage has been applied today, it should be removed and re-applied every 3 to 4 hours. It should not be applied tightly, but firmly enough to keep swelling down. Watch toes and feet for swelling, bluish discoloration, coldness, numbness or excessive pain. If any of these problems (symptoms) occur, remove the ace bandage and re-apply more loosely. If these symptoms persist, contact your caregiver or return to this location. °· For the first 24 hours, keep the injured extremity elevated on pillows while lying down. °· Apply ice for 15-20 minutes to the sore joint every couple hours while awake for the first half day. Then 03-04 times per day for the first 48 hours. Put the ice in a plastic bag and place a towel between the bag of ice and your skin. °· Wear any splinting, casting, elastic bandage applications, or slings as instructed. °· Only take over-the-counter or prescription medicines for pain, discomfort, or fever as  directed by your caregiver. Do not use aspirin immediately after the injury unless instructed by your physician. Aspirin can cause increased bleeding and bruising of the tissues. °· If you were given crutches, continue to use them as instructed and do not resume weight bearing on the sore joint until instructed. °Persistent pain and inability to use the sore joint as directed for more than 2 to 3 days are warning signs indicating that you should see a caregiver for a follow-up visit as soon as possible. Initially, a hairline fracture (break in bone) may not be evident on X-rays. Persistent pain and swelling indicate that further evaluation, non-weight bearing or use of the joint (use of crutches or slings as instructed), or further X-rays are indicated. X-rays may sometimes not show a small fracture until a week or 10 days later. Make a follow-up appointment with your own caregiver or one to whom we have referred you. A radiologist (specialist in reading X-rays) may read your X-rays. Make sure you know how you are to obtain your X-ray results. Do not assume everything is normal if you do not hear from us. °SEEK MEDICAL CARE IF: °Bruising, swelling, or pain increases. °SEEK IMMEDIATE MEDICAL CARE IF:  °· Your fingers or toes are numb or blue. °· The pain is not responding to medications and continues to stay the same or get worse. °· The pain in your joint becomes severe. °· You develop a fever over 102° F (38.9° C). °· It becomes impossible to move or use the joint. °MAKE SURE YOU:  °·   Understand these instructions.  Will watch your condition.  Will get help right away if you are not doing well or get worse. Document Released: 11/25/2005 Document Revised: 02/17/2012 Document Reviewed: 07/13/2008 [facility] Patient Information 2014 [place].  Heat Therapy Heat therapy can help make painful, stiff muscles and joints feel better. Do not use heat on new injuries. Wait at least 48 hours after an injury to  use heat. Do not use heat when you have aches or pains right after an activity. If you still have pain 3 hours after stopping the activity, then you may use heat. HOME CARE Wet heat pack  Soak a clean towel in warm water. Squeeze out the extra water.  Put the warm, wet towel in a plastic bag.  Place a thin, dry towel between your skin and the bag.  Put the heat pack on the area for 5 minutes, and check your skin. Your skin may be pink, but it should not be red.  Leave the heat pack on the area for 15 to 30 minutes.  Repeat this every 2 to 4 hours while awake. Do not use heat while you are sleeping. Warm water bath  Fill a tub with warm water.  Place the affected body part in the tub.  Soak the area for 20 to 40 minutes.  Repeat as needed. Hot water bottle  Fill the water bottle half full with hot water.  Press out the extra air. Close the cap tightly.  Place a dry towel between your skin and the bottle.  Put the bottle on the area for 5 minutes, and check your skin. Your skin may be pink, but it should not be red.  Leave the bottle on the area for 15 to 30 minutes.  Repeat this every 2 to 4 hours while awake. Electric heating pad  Place a dry towel between your skin and the heating pad.  Set the heating pad on low heat.  Put the heating pad on the area for 10 minutes, and check your skin. Your skin may be pink, but it should not be red.  Leave the heating pad on the area for 20 to 40 minutes.  Repeat this every 2 to 4 hours while awake.  Do not lie on the heating pad.  Do not fall asleep while using the heating pad.  Do not use the heating pad near water. GET HELP RIGHT AWAY IF:  You get blisters or red skin.  Your skin is puffy (swollen), or you lose feeling (numbness) in the affected area.  You have any new problems.  Your problems are getting worse.  You have any questions or concerns. If you have any problems, stop using heat therapy until you see  your doctor. MAKE SURE YOU:  Understand these instructions.  Will watch your condition.  Will get help right away if you are not doing well or get worse. Document Released: 02/17/2012 Document Reviewed: 02/17/2012 [facility] Patient Information 2014 [place].

## 2014-04-02 NOTE — ED Provider Notes (Signed)
CSN: [id number]     Arrival date & time 04/02/14  1914 History   First MD Initiated Contact with Patient 04/02/14 1940 This chart was scribed for non-physician practitioner Domenic Moras, PA-C working with Saddie Benders. Dorna Mai, MD by Anastasia Pall, ED scribe. This patient was seen in room TR08C/TR08C and the patient's care was started at 8:12 PM.   Chief Complaint  Patient presents with  . Knee Pain   (Consider location/radiation/quality/duration/timing/severity/associated sxs/prior Treatment) The history is provided by the patient. No language interpreter was used.   HPI Comments: Tina Mayo is a 43 y.o. female who presents to the Emergency Department complaining of gradually improving, constant left knee pain, with associated swelling, onset this morning after standing at work for 12 hours at an overnight shift. She also reports mild left ankle swelling. She reports taking Aleve for her pain earlier today, with some relief. She reports being seen here before for knee pain, was checked for DVT with negative results. She denies right knee pain, recent falls, wounds, fever, chills, and any other associated symptoms. She denies being on birth control, recent surgeries, long distance travels. She denies h/o cancer, DM. Report having her new job for the past 3 weeks, and having to changes shoes several times for comfort.    PCP - No PCP Per Patient  Past Medical History  Diagnosis Date  . Depression   . Anxiety   . Asthma    Past Surgical History  Procedure Laterality Date  . Right ankle surgery      2010  . Tubal ligation    . Benign breast tumor removed      2006   Family History  Problem Relation Age of Onset  . Breast cancer Mother   . Cancer Mother   . Diabetes Mellitus II Father   . Diabetes Father    History  Substance Use Topics  . Smoking status: Current Every Day Smoker -- 0.50 packs/day    Types: Cigarettes  . Smokeless tobacco: Current User  . Alcohol Use: No   OB  History   Grav Para Term Preterm Abortions TAB SAB Ect Mult Living                 Review of Systems  Constitutional: Negative for fever and chills.  Musculoskeletal: Positive for arthralgias (left knee) and joint swelling (left knee, left ankle).  Skin: Negative for wound.   Allergies  Levaquin  Home Medications   Prior to Admission medications   Medication Sig Start Date End Date Taking? Authorizing Provider  albuterol (PROVENTIL HFA;VENTOLIN HFA) 108 (90 BASE) MCG/ACT inhaler Inhale 2 puffs into the lungs every 4 (four) hours as needed for wheezing or shortness of breath. 10/12/13   Jonetta Osgood, MD  CALCIUM PO Take 1 tablet by mouth daily.    Historical Provider, MD  DiphenhydrAMINE HCl, Sleep, (ZZZQUIL) 25 MG CAPS Take 25 mg by mouth at bedtime as needed (sleep).    Historical Provider, MD  ferrous fumarate (HEMOCYTE - 106 MG FE) 325 (106 FE) MG TABS tablet Take 1 tablet by mouth 2 (two) times daily.    Historical Provider, MD  ibuprofen (ADVIL,MOTRIN) 800 MG tablet Take 800 mg by mouth every 8 (eight) hours as needed for moderate pain.    Historical Provider, MD  omeprazole (PRILOSEC) 20 MG capsule Take 1 capsule (20 mg total) by mouth daily. 01/12/14   Lorayne Marek, MD  varenicline (CHANTIX STARTING MONTH PAK) 0.5 MG X 11 & 1  MG X 42 tablet Take one 0.5 mg tablet by mouth once daily for 3 days, then increase to one 0.5 mg tablet twice daily for 4 days, then increase to one 1 mg tablet twice daily. 01/12/14   Lorayne Marek, MD   BP 105/66  Pulse 84  Temp(Src) 98.9 F (37.2 C) (Oral)  Resp 14  Ht 5\' 7"  (1.702 m)  Wt 192 lb (87.091 kg)  BMI 30.06 kg/m2  SpO2 100%  LMP 03/14/2014  Physical Exam  Nursing note and vitals reviewed. Constitutional: She is oriented to person, place, and time. She appears well-developed and well-nourished. No distress.  HENT:  Head: Normocephalic and atraumatic.  Eyes: EOM are normal.  Neck: Neck supple.  Cardiovascular: Normal rate.    Pulmonary/Chest: Effort normal. No respiratory distress.  Musculoskeletal: Normal range of motion. She exhibits edema and tenderness.  Left ankle: normal, full ROM. Normal extension and flexion. Left knee: Increasing pain with left knee flexion, normal extension. Tenderness over anterior superior aspect of left knee with mild edema. No redness, no foreign object. No deformity. Negative posterior and anterior drawer test. Tenderness with varus and valgus movement. Otherwise bilateral LE without palpable chords, erythema, edema, negative Homen sign.   Neurological: She is alert and oriented to person, place, and time.  Skin: Skin is warm and dry.  Psychiatric: She has a normal mood and affect. Her behavior is normal.   ED Course  Procedures (including critical care time)  DIAGNOSTIC STUDIES: Oxygen Saturation is 100% on room air, normal by my interpretation.    COORDINATION OF CARE: 8:19 PM - Discussed treatment plan which includes pain medication and muscle relaxant with pt at bedside and pt agreed to plan. Will give pt referal to orthopedist. Doubt septic joint, DVT, cellulitis or other acute emergent condition.  Knee sleeve provided.    Labs Review Labs Reviewed - No data to display  Imaging Review No results found.   EKG Interpretation None     Medications - No data to display MDM   Final diagnoses:  Left anterior knee pain    BP 105/66  Pulse 84  Temp(Src) 98.9 F (37.2 C) (Oral)  Resp 14  Ht 5\' 7"  (1.702 m)  Wt 192 lb (87.091 kg)  BMI 30.06 kg/m2  SpO2 100%  LMP 03/14/2014   I personally performed the services described in this documentation, which was scribed in my presence. The recorded information has been reviewed and is accurate.     Domenic Moras, PA-C 04/02/14 2024

## 2014-04-02 NOTE — ED Notes (Signed)
Pt got off work this morning at [employer], as she was getting in car she noticed her left knee was painful.  When she got home she noticed swelling, took Aleve x 2, woke up this evening swelling has decreased but still painful.  Last Aleve x 2 @ 4pm.  No injury.  Pt states she stands all shift at work.

## 2014-04-03 NOTE — ED Provider Notes (Signed)
Medical screening examination/treatment/procedure(s) were performed by non-physician practitioner and as supervising physician I was immediately available for consultation/collaboration.  Saddie Benders. Erlene Devita, MD 04/03/14 0040

## 2014-04-12 ENCOUNTER — Ambulatory Visit: Payer: Medicaid Other | Admitting: Internal Medicine

## 2014-04-19 ENCOUNTER — Encounter (HOSPITAL_COMMUNITY): Payer: Self-pay | Admitting: Emergency Medicine

## 2014-04-19 ENCOUNTER — Emergency Department (HOSPITAL_COMMUNITY): Payer: Medicaid Other

## 2014-04-19 ENCOUNTER — Emergency Department (HOSPITAL_COMMUNITY)
Admission: EM | Admit: 2014-04-19 | Discharge: 2014-04-19 | Disposition: A | Discharge status: Home / Self Care | Payer: Medicaid Other | Attending: Emergency Medicine | Admitting: Emergency Medicine

## 2014-04-19 DIAGNOSIS — M25562 Pain in left knee: Secondary | ICD-10-CM

## 2014-04-19 DIAGNOSIS — Z79899 Other long term (current) drug therapy: Secondary | ICD-10-CM | POA: Insufficient documentation

## 2014-04-19 DIAGNOSIS — M25462 Effusion, left knee: Secondary | ICD-10-CM

## 2014-04-19 DIAGNOSIS — M1710 Unilateral primary osteoarthritis, unspecified knee: Secondary | ICD-10-CM

## 2014-04-19 DIAGNOSIS — M179 Osteoarthritis of knee, unspecified: Secondary | ICD-10-CM

## 2014-04-19 DIAGNOSIS — Z8659 Personal history of other mental and behavioral disorders: Secondary | ICD-10-CM | POA: Insufficient documentation

## 2014-04-19 DIAGNOSIS — J45909 Unspecified asthma, uncomplicated: Secondary | ICD-10-CM | POA: Insufficient documentation

## 2014-04-19 DIAGNOSIS — M25559 Pain in unspecified hip: Secondary | ICD-10-CM | POA: Insufficient documentation

## 2014-04-19 DIAGNOSIS — IMO0002 Reserved for concepts with insufficient information to code with codable children: Secondary | ICD-10-CM | POA: Insufficient documentation

## 2014-04-19 DIAGNOSIS — F17200 Nicotine dependence, unspecified, uncomplicated: Secondary | ICD-10-CM | POA: Insufficient documentation

## 2014-04-19 MED ORDER — TRAMADOL HCL 50 MG PO TABS: 50.0000 mg | ORAL_TABLET | Freq: Four times a day (QID) | ORAL | Status: DC | PRN

## 2014-04-19 MED ORDER — NAPROXEN 500 MG PO TABS: 500.0000 mg | ORAL_TABLET | Freq: Two times a day (BID) | ORAL | Status: DC

## 2014-04-19 MED ORDER — ONDANSETRON 4 MG PO TBDP
4.0000 mg | ORAL_TABLET | Freq: Once | ORAL | Status: AC
  Administered 2014-04-19: 4 mg via ORAL
  Filled 2014-04-19: qty 1

## 2014-04-19 MED ORDER — OXYCODONE-ACETAMINOPHEN 5-325 MG PO TABS
1.0000 | ORAL_TABLET | Freq: Once | ORAL | Status: AC
Start: 2014-04-19 — End: 2014-04-19
  Administered 2014-04-19: 1 via ORAL
  Filled 2014-04-19: qty 1

## 2014-04-19 NOTE — Discharge Instructions (Signed)
Knee Effusion The medical term for having fluid in your knee is effusion. This is often due to an internal derangement of the knee. This means something is wrong inside the knee. Some of the causes of fluid in the knee may be torn cartilage, a torn ligament, or bleeding into the joint from an injury. Your knee is likely more difficult to bend and move. This is often because there is increased pain and pressure in the joint. The time it takes for recovery from a knee effusion depends on different factors, including:   Type of injury.  Your age.  Physical and medical conditions.  Rehabilitation Strategies. How long you will be away from your normal activities will depend on what kind of knee problem you have and how much damage is present. Your knee has two types of cartilage. Articular cartilage covers the bone ends and lets your knee bend and move smoothly. Two menisci, thick pads of cartilage that form a rim inside the joint, help absorb shock and stabilize your knee. Ligaments bind the bones together and support your knee joint. Muscles move the joint, help support your knee, and take stress off the joint itself. CAUSES  Often an effusion in the knee is caused by an injury to one of the menisci. This is often a tear in the cartilage. Recovery after a meniscus injury depends on how much meniscus is damaged and whether you have damaged other knee tissue. Small tears may heal on their own with conservative treatment. Conservative means rest, limited weight bearing activity and muscle strengthening exercises. Your recovery may take up to 6 weeks.  TREATMENT  Larger tears may require surgery. Meniscus injuries may be treated during arthroscopy. Arthroscopy is a procedure in which your surgeon uses a small telescope like instrument to look in your knee. Your caregiver can make a more accurate diagnosis (learning what is wrong) by performing an arthroscopic procedure. If your injury is on the inner margin  of the meniscus, your surgeon may trim the meniscus back to a smooth rim. In other cases your surgeon will try to repair a damaged meniscus with stitches (sutures). This may make rehabilitation take longer, but may provide better long term result by helping your knee keep its shock absorption capabilities. Ligaments which are completely torn usually require surgery for repair. HOME CARE INSTRUCTIONS  Use crutches as instructed.  If a brace is applied, use as directed.  Once you are home, an ice pack applied to your swollen knee may help with discomfort and help decrease swelling.  Keep your knee raised (elevated) when you are not up and around or on crutches.  Only take over-the-counter or prescription medicines for pain, discomfort, or fever as directed by your caregiver.  Your caregivers will help with instructions for rehabilitation of your knee. This often includes strengthening exercises.  You may resume a normal diet and activities as directed. SEEK MEDICAL CARE IF:   There is increased swelling in your knee.  You notice redness, swelling, or increasing pain in your knee.  An unexplained oral temperature above 102 F (38.9 C) develops. SEEK IMMEDIATE MEDICAL CARE IF:   You develop a rash.  You have difficulty breathing.  You have any allergic reactions from medications you may have been given.  There is severe pain with any motion of the knee. MAKE SURE YOU:   Understand these instructions.  Will watch your condition.  Will get help right away if you are not doing well or get worse.  Document Released: 02/15/2004 Document Revised: 02/17/2012 Document Reviewed: 04/20/2008 [facility] Patient Information 2014 [place].  Osteoarthritis Osteoarthritis is a disease that causes soreness and swelling (inflammation) of a joint. It occurs when the cartilage at the affected joint wears down. Cartilage acts as a cushion, covering the ends of bones where they meet to form a  joint. Osteoarthritis is the most common form of arthritis. It often occurs in older people. The joints affected most often by this condition include those in the:  Ends of the fingers.  Thumbs.  Neck.  Lower back.  Knees.  Hips. CAUSES  Over time, the cartilage that covers the ends of bones begins to wear away. This causes bone to rub on bone, producing pain and stiffness in the affected joints.  RISK FACTORS Certain factors can increase your chances of having osteoarthritis, including:  Older age.  Excessive body weight.  Overuse of joints. SIGNS AND SYMPTOMS   Pain, swelling, and stiffness in the joint.  Over time, the joint may lose its normal shape.  Small deposits of bone (osteophytes) may grow on the edges of the joint.  Bits of bone or cartilage can break off and float inside the joint space. This may cause more pain and damage. DIAGNOSIS  Your health care provider will do a physical exam and ask about your symptoms. Various tests may be ordered, such as:  X-rays of the affected joint.  An MRI scan.  Blood tests to rule out other types of arthritis.  Joint fluid tests. This involves using a needle to draw fluid from the joint and examining the fluid under a microscope. TREATMENT  Goals of treatment are to control pain and improve joint function. Treatment plans may include:  A prescribed exercise program that allows for rest and joint relief.  A weight control plan.  Pain relief techniques, such as:  Properly applied heat and cold.  Electric pulses delivered to nerve endings under the skin (transcutaneous electrical nerve stimulation, TENS).  Massage.  Certain nutritional supplements.  Medicines to control pain, such as:  Acetaminophen.  Nonsteroidal anti-inflammatory drugs (NSAIDs), such as naproxen.  Narcotic or central-acting agents, such as tramadol.  Corticosteroids. These can be given orally or as an injection.  Surgery to reposition  the bones and relieve pain (osteotomy) or to remove loose pieces of bone and cartilage. Joint replacement may be needed in advanced states of osteoarthritis. HOME CARE INSTRUCTIONS   Only take over-the-counter or prescription medicines as directed by your health care provider. Take all medicines exactly as instructed.  Maintain a healthy weight. Follow your health care provider's instructions for weight control. This may include dietary instructions.  Exercise as directed. Your health care provider can recommend specific types of exercise. These may include:  Strengthening exercises These are done to strengthen the muscles that support joints affected by arthritis. They can be performed with weights or with exercise bands to add resistance.  Aerobic activities These are exercises, such as brisk walking or low-impact aerobics, that get your heart pumping.  Range-of-motion activities These keep your joints limber.  Balance and agility exercises These help you maintain daily living skills.  Rest your affected joints as directed by your health care provider.  Follow up with your health care provider as directed. SEEK MEDICAL CARE IF:   Your skin turns red.  You develop a rash in addition to your joint pain.  You have worsening joint pain. SEEK IMMEDIATE MEDICAL CARE IF:  You have a significant loss of  weight or appetite.  You have a fever along with joint or muscle aches.  You have night sweats. [place] [employer] Arth[employer] and Musculoskeletal and Skin Diseases: www.niams.SouthExposed.es [employer] on Aging: http://kim-miller.com/ American College of Rheumatology: www.rheumatology.org Document Released: 11/25/2005 Document Revised: 09/15/2013 Document Reviewed: 08/02/2013 [facility] Patient Information 2014 [place], [place].

## 2014-04-19 NOTE — ED Notes (Signed)
Pt was seen here 04/02/2014 for left knee pain and was sent home with brace, muscle relaxer, and pain medicine.  Pt now with increased swelling and has knot under left upper thigh.  Pulses good.

## 2014-04-19 NOTE — ED Notes (Signed)
PT reports a raised tender site on Lt inner thigh that is new since th 4-25 2015  ED visit. Area is tender to touch on assessment.

## 2014-04-19 NOTE — ED Notes (Signed)
Patient transported to X-ray 

## 2014-04-19 NOTE — ED Provider Notes (Signed)
CSN: [id number]     Arrival date & time 04/19/14  1018 History   First MD Initiated Contact with Patient 04/19/14 1220     Chief Complaint  Patient presents with  . Leg Pain    left thigh and knee pain  . Joint Swelling     (Consider location/radiation/quality/duration/timing/severity/associated sxs/prior Treatment) HPI Tina Mayo is a(n) 43 y.o. female who presents wit cc L knee pain. She was seen in [facility] 2 weeks ago. She states that she lost her job because she has been unable to ambulate for long periods of time. No know injury. Taking naproxen with only mild relief of sxs. C/o "knot" on medial thigh that is ttp. Her knee pain is constant, worse with movement and ambulation. She c/o swelling. Denies Heat or redness. Patient also uses the bus for transportation and has had difficulty performing ADLs. Denies SOB, cp, hemoptysis, calf pain.  Past Medical History  Diagnosis Date  . Depression   . Anxiety   . Asthma    Past Surgical History  Procedure Laterality Date  . Right ankle surgery      2010  . Tubal ligation    . Benign breast tumor removed      2006   Family History  Problem Relation Age of Onset  . Breast cancer Mother   . Cancer Mother   . Diabetes Mellitus II Father   . Diabetes Father    History  Substance Use Topics  . Smoking status: Current Every Day Smoker -- 0.50 packs/day    Types: Cigarettes  . Smokeless tobacco: Current User  . Alcohol Use: No   OB History   Grav Para Term Preterm Abortions TAB SAB Ect Mult Living                 Review of Systems  Ten systems reviewed and are negative for acute change, except as noted in the HPI.    Allergies  Levaquin  Home Medications   Prior to Admission medications   Medication Sig Start Date End Date Taking? Authorizing Provider  albuterol (PROVENTIL HFA;VENTOLIN HFA) 108 (90 BASE) MCG/ACT inhaler Inhale 2 puffs into the lungs every 4 (four) hours as needed for wheezing or shortness of  breath. 10/12/13  Yes Shanker Kristeen Mans, MD  ferrous fumarate (HEMOCYTE - 106 MG FE) 325 (106 FE) MG TABS tablet Take 2 tablets by mouth 2 (two) times daily.    Yes Historical Provider, MD   BP 92/68  Pulse 85  Temp(Src) 98.3 F (36.8 C) (Oral)  Resp 18  Ht 5\' 7"  (1.702 m)  Wt 202 lb (91.627 kg)  BMI 31.63 kg/m2  SpO2 97%  LMP 04/08/2014 Physical Exam Physical Exam  Nursing note and vitals reviewed. Constitutional: She is oriented to person, place, and time. She appears well-developed and well-nourished. No distress.  HENT:  Head: Normocephalic and atraumatic.  Eyes: Conjunctivae normal and EOM are normal. Pupils are equal, round, and reactive to light. No scleral icterus.  Neck: Normal range of motion.  Cardiovascular: Normal rate, regular rhythm and normal heart sounds.  Exam reveals no gallop and no friction rub.   No murmur heard. Pulmonary/Chest: Effort normal and breath sounds normal. No respiratory distress.  Abdominal: Soft. Bowel sounds are normal. She exhibits no distension and no mass. There is no tenderness. There is no guarding.  Neurological: She is alert and oriented to person, place, and time.  Musculoskeletal: Knee exam: left positive for moderate crepitations, some mild tenderness  and pain on range of motion, Suprapatellar effusion is present, no ligamentous laxity noted.  Skin: Skin is warm and dry. She is not diaphoretic.    ED Course  Procedures (including critical care time) Labs Review Labs Reviewed - No data to display  Imaging Review Dg Knee Complete 4 Views Left  04/19/2014   CLINICAL DATA:  Left knee and thigh pain.  Feeling of knot.  EXAM: LEFT KNEE - COMPLETE 4+ VIEW  COMPARISON:  None.  FINDINGS: Minimal patellofemoral joint degenerative changes.  Small suprapatellar joint effusion.  No fracture or dislocation  IMPRESSION: Minimal patellofemoral joint degenerative changes.  Small suprapatellar joint effusion.   Electronically Signed   By: Chauncey Cruel M.D.   On: 04/19/2014 13:29     EKG Interpretation None      MDM   Final diagnoses:  Knee pain, left  DJD (degenerative joint disease) of knee  Knee effusion, left    Patient with DJD of L knee and effusion. Will manage with change in pain meds and f/u with the ortho. The patient appears reasonably screened and/or stabilized for discharge and I doubt any other medical condition or other [facility] requiring further screening, evaluation, or treatment in the ED at this time prior to discharge.     Margarita Mail, PA-C 04/19/14 1414

## 2014-04-19 NOTE — ED Provider Notes (Signed)
Medical screening examination/treatment/procedure(s) were performed by non-physician practitioner and as supervising physician I was immediately available for consultation/collaboration.   EKG Interpretation None       Orlie Dakin, MD 04/19/14 2056

## 2014-05-03 ENCOUNTER — Other Ambulatory Visit: Payer: Self-pay | Admitting: Internal Medicine

## 2014-05-03 DIAGNOSIS — N63 Unspecified lump in unspecified breast: Secondary | ICD-10-CM

## 2014-05-18 ENCOUNTER — Other Ambulatory Visit: Payer: Self-pay | Admitting: Internal Medicine

## 2014-05-18 DIAGNOSIS — N63 Unspecified lump in unspecified breast: Secondary | ICD-10-CM

## 2014-05-19 ENCOUNTER — Other Ambulatory Visit: Payer: Medicaid Other

## 2014-09-06 ENCOUNTER — Encounter (HOSPITAL_COMMUNITY): Payer: Self-pay | Admitting: Emergency Medicine

## 2014-09-06 ENCOUNTER — Emergency Department (HOSPITAL_COMMUNITY)
Admission: EM | Admit: 2014-09-06 | Discharge: 2014-09-06 | Disposition: A | Discharge status: Home / Self Care | Payer: Medicaid Other | Attending: Emergency Medicine | Admitting: Emergency Medicine

## 2014-09-06 DIAGNOSIS — F17200 Nicotine dependence, unspecified, uncomplicated: Secondary | ICD-10-CM | POA: Insufficient documentation

## 2014-09-06 DIAGNOSIS — Z79899 Other long term (current) drug therapy: Secondary | ICD-10-CM | POA: Insufficient documentation

## 2014-09-06 DIAGNOSIS — Z3202 Encounter for pregnancy test, result negative: Secondary | ICD-10-CM | POA: Insufficient documentation

## 2014-09-06 DIAGNOSIS — D649 Anemia, unspecified: Secondary | ICD-10-CM | POA: Insufficient documentation

## 2014-09-06 DIAGNOSIS — M79609 Pain in unspecified limb: Secondary | ICD-10-CM | POA: Diagnosis not present

## 2014-09-06 DIAGNOSIS — J45909 Unspecified asthma, uncomplicated: Secondary | ICD-10-CM | POA: Insufficient documentation

## 2014-09-06 DIAGNOSIS — M79604 Pain in right leg: Secondary | ICD-10-CM

## 2014-09-06 DIAGNOSIS — Z8659 Personal history of other mental and behavioral disorders: Secondary | ICD-10-CM | POA: Insufficient documentation

## 2014-09-06 LAB — CBC
HCT: 31.7 % — ABNORMAL LOW (ref 36.0–46.0)
Hemoglobin: 11.6 g/dL — ABNORMAL LOW (ref 12.0–15.0)
MCH: 29.1 pg (ref 26.0–34.0)
MCHC: 36.6 g/dL — ABNORMAL HIGH (ref 30.0–36.0)
MCV: 79.4 fL (ref 78.0–100.0)
Platelets: 78 10*3/uL — ABNORMAL LOW (ref 150–400)
RBC: 3.99 MIL/uL (ref 3.87–5.11)
RDW: 14.9 % (ref 11.5–15.5)
WBC: 9.7 10*3/uL (ref 4.0–10.5)

## 2014-09-06 LAB — URINALYSIS, ROUTINE W REFLEX MICROSCOPIC
BILIRUBIN URINE: NEGATIVE
Glucose, UA: NEGATIVE mg/dL
Hgb urine dipstick: NEGATIVE
KETONES UR: NEGATIVE mg/dL
NITRITE: NEGATIVE
Protein, ur: NEGATIVE mg/dL
Specific Gravity, Urine: 1.015 (ref 1.005–1.030)
Urobilinogen, UA: 1 mg/dL (ref 0.0–1.0)
pH: 7 (ref 5.0–8.0)

## 2014-09-06 LAB — BASIC METABOLIC PANEL
Anion gap: 8 (ref 5–15)
BUN: 8 mg/dL (ref 6–23)
CALCIUM: 8.8 mg/dL (ref 8.4–10.5)
CO2: 27 meq/L (ref 19–32)
Chloride: 106 mEq/L (ref 96–112)
Creatinine, Ser: 0.75 mg/dL (ref 0.50–1.10)
GFR calc Af Amer: >90 mL/min (ref >90)
GFR calc non Af Amer: >90 mL/min (ref >90)
Glucose, Bld: 82 mg/dL (ref 70–99)
Potassium: 4.7 mEq/L (ref 3.7–5.3)
Sodium: 141 mEq/L (ref 137–147)

## 2014-09-06 LAB — PREGNANCY, URINE: Preg Test, Ur: NEGATIVE

## 2014-09-06 LAB — URINE MICROSCOPIC-ADD ON

## 2014-09-06 MED ORDER — OXYCODONE-ACETAMINOPHEN 5-325 MG PO TABS: 1.0000 | ORAL_TABLET | Freq: Four times a day (QID) | ORAL | Status: DC | PRN

## 2014-09-06 MED ORDER — OXYCODONE-ACETAMINOPHEN 5-325 MG PO TABS
1.0000 | ORAL_TABLET | Freq: Once | ORAL | Status: AC
  Administered 2014-09-06: 1 via ORAL
  Filled 2014-09-06: qty 1

## 2014-09-06 NOTE — ED Notes (Signed)
Patient states sickle cell crisis with leg pain and back pain.  Patient has no fever.  Denies other symptoms.

## 2014-09-06 NOTE — ED Notes (Signed)
MD at bedside. 

## 2014-09-06 NOTE — ED Provider Notes (Signed)
CSN: [id number]     Arrival date & time 09/06/14  6295 History   First MD Initiated Contact with Patient 09/06/14 1057     Chief Complaint  Patient presents with  . Leg Pain  . Back Pain     (Consider location/radiation/quality/duration/timing/severity/associated sxs/prior Treatment) HPI Pt presenting with c/o right lower leg pain.  She states pain began this morning- she states pain is in right mid thigh area and in her right calf.  No trauma or injury to leg.  She also has a chronically swollen vein in her right foot that she feels is increased in size- this has been present since a prior injury to right ankle.  No chest pain, no difficulty breathing.  No fever/chills.  She states she has hx of sickle cell disease, but when clarified with patient she states this is sickle cell trait.  No hx of easy bruising or bleeding.  There are no other associated systemic symptoms, there are no other alleviating or modifying factors.   Past Medical History  Diagnosis Date  . Depression   . Anxiety   . Asthma    Past Surgical History  Procedure Laterality Date  . Right ankle surgery      2010  . Tubal ligation    . Benign breast tumor removed      2006   Family History  Problem Relation Age of Onset  . Breast cancer Mother   . Cancer Mother   . Diabetes Mellitus II Father   . Diabetes Father    History  Substance Use Topics  . Smoking status: Current Every Day Smoker -- 0.50 packs/day    Types: Cigarettes  . Smokeless tobacco: Current User  . Alcohol Use: No   OB History   Grav Para Term Preterm Abortions TAB SAB Ect Mult Living                 Review of Systems ROS reviewed and all otherwise negative except for mentioned in HPI    Allergies  Levaquin  Home Medications   Prior to Admission medications   Medication Sig Start Date End Date Taking? Authorizing Provider  albuterol (PROVENTIL HFA;VENTOLIN HFA) 108 (90 BASE) MCG/ACT inhaler Inhale 2 puffs into the lungs every  4 (four) hours as needed for wheezing or shortness of breath. 10/12/13  Yes Shanker Kristeen Mans, MD  naproxen sodium (ANAPROX) 220 MG tablet Take 440 mg by mouth 2 (two) times daily as needed (pain).   Yes Historical Provider, MD  oxyCODONE-acetaminophen (PERCOCET/ROXICET) 5-325 MG per tablet Take 1-2 tablets by mouth every 6 (six) hours as needed for severe pain. 09/06/14   Threasa Beards, MD   BP 106/69  Pulse 70  Temp(Src) 98.3 F (36.8 C) (Oral)  Resp 12  Ht 5\' 7"  (1.702 m)  Wt 202 lb (91.627 kg)  BMI 31.63 kg/m2  SpO2 100% Vitals reviewed Physical Exam Physical Examination: General appearance - alert, well appearing, and in no distress Mental status - alert, oriented to person, place, and time Eyes - no conjunctival injection, no scleral icterus Chest - clear to auscultation, no wheezes, rales or rhonchi, symmetric air entry Heart - normal rate, regular rhythm, normal S1, S2, no murmurs, rubs, clicks or gallops Neurological - alert, oriented, normal speech, no focal findings or movement disorder noted Musculoskeletal - no joint tenderness, deformity or swelling Extremities -ttp over right medial thigh with small area of bruising present, also engorged vein over right dorsum of foot- nontender, non  pulsatile,  peripheral pulses normal, no pedal edema, no clubbing or cyanosis Skin - normal coloration and turgor, no rashes  ED Course  Procedures (including critical care time) Labs Review Labs Reviewed  CBC - Abnormal; Notable for the following:    Hemoglobin 11.6 (*)    HCT 31.7 (*)    MCHC 36.6 (*)    Platelets 78 (*)    All other components within normal limits  URINALYSIS, ROUTINE W REFLEX MICROSCOPIC - Abnormal; Notable for the following:    APPearance HAZY (*)    Leukocytes, UA TRACE (*)    All other components within normal limits  URINE MICROSCOPIC-ADD ON - Abnormal; Notable for the following:    Squamous Epithelial / LPF MANY (*)    Bacteria, UA FEW (*)    All other  components within normal limits  BASIC METABOLIC PANEL  PREGNANCY, URINE    Imaging Review No results found.   EKG Interpretation None      MDM   Final diagnoses:  Pain of right lower extremity    Pt presenting with c/o pain in right lower extremity. She has hx of sickle cell trait, NOT sickle cell disease.  She does have some anemia which is at her baseline.  DVT study shows no acute abnormality.  Pt feels improved after pain meds.  Advised to f/u with PMD.  Discharged with strict return precautions.  Pt agreeable with plan.    Threasa Beards, MD 09/06/14 1600

## 2014-09-06 NOTE — Discharge Instructions (Signed)
Return to the ED with any concerns including increased pain in leg, swelling of leg, chest pain, difficulty breathing, decreased level of alertness/lethargy, or any other alarming symptoms

## 2014-09-06 NOTE — Progress Notes (Signed)
*  PRELIMINARY RESULTS* Vascular Ultrasound Right lower extremity venous duplex has been completed.  Preliminary findings: No evidence of DVT or superficial thrombosis. The area of concern on right foot was imaged and appears to be cystic area vs dilated non-thrombosed vein.  Landry Mellow, RDMS, RVT  09/06/2014, 2:05 PM

## 2014-09-13 ENCOUNTER — Encounter: Payer: Self-pay | Admitting: Internal Medicine

## 2014-09-13 ENCOUNTER — Ambulatory Visit: Payer: Medicaid Other | Attending: Internal Medicine | Admitting: Internal Medicine

## 2014-09-13 VITALS — BP 105/71 | HR 102 | Temp 98.3°F | Resp 16 | Wt 190.0 lb

## 2014-09-13 DIAGNOSIS — M79671 Pain in right foot: Secondary | ICD-10-CM | POA: Diagnosis present

## 2014-09-13 DIAGNOSIS — J45909 Unspecified asthma, uncomplicated: Secondary | ICD-10-CM | POA: Diagnosis not present

## 2014-09-13 DIAGNOSIS — R21 Rash and other nonspecific skin eruption: Secondary | ICD-10-CM

## 2014-09-13 DIAGNOSIS — Z79899 Other long term (current) drug therapy: Secondary | ICD-10-CM | POA: Insufficient documentation

## 2014-09-13 DIAGNOSIS — F17210 Nicotine dependence, cigarettes, uncomplicated: Secondary | ICD-10-CM | POA: Diagnosis not present

## 2014-09-13 MED ORDER — CLOTRIMAZOLE-BETAMETHASONE 1-0.05 % EX CREA: 1.0000 "application " | TOPICAL_CREAM | Freq: Two times a day (BID) | CUTANEOUS | Status: DC

## 2014-09-13 MED ORDER — TRAMADOL HCL 50 MG PO TABS
50.0000 mg | ORAL_TABLET | Freq: Three times a day (TID) | ORAL | Status: DC | PRN
Start: 2014-09-13 — End: 2015-01-30

## 2014-09-13 NOTE — Progress Notes (Signed)
MRN: [id number] Name: Tina Mayo  Sex: female Age: 43 y.o. DOB: 05-30-1971  Allergies: Levaquin  Chief Complaint  Patient presents with  . Foot Pain    right    HPI: Patient is 43 y.o. female who went recently to the emergency room with symptoms of right foot pain and swelling, EMR reviewed, patient had her ultrasound of her of right lower extremity done which was negative for DVT, had questionable cyst or engorged vein patient was prescribed pain medication as per patient it is still tender to touch, denies any fever chills, also complaining of noticed itchy rash in her thighs, she has tried over-the-counter creams without much improvement.  Past Medical History  Diagnosis Date  . Depression   . Anxiety   . Asthma     Past Surgical History  Procedure Laterality Date  . Right ankle surgery      2010  . Tubal ligation    . Benign breast tumor removed      2006      Medication List       This list is accurate as of: 09/13/14  3:42 PM.  Always use your most recent med list.               albuterol 108 (90 BASE) MCG/ACT inhaler  Commonly known as:  PROVENTIL HFA;VENTOLIN HFA  Inhale 2 puffs into the lungs every 4 (four) hours as needed for wheezing or shortness of breath.     clotrimazole-betamethasone cream  Commonly known as:  LOTRISONE  Apply 1 application topically 2 (two) times daily.     naproxen sodium 220 MG tablet  Commonly known as:  ANAPROX  Take 440 mg by mouth 2 (two) times daily as needed (pain).     oxyCODONE-acetaminophen 5-325 MG per tablet  Commonly known as:  PERCOCET/ROXICET  Take 1-2 tablets by mouth every 6 (six) hours as needed for severe pain.     traMADol 50 MG tablet  Commonly known as:  ULTRAM  Take 1 tablet (50 mg total) by mouth every 8 (eight) hours as needed for moderate pain.        Meds ordered this encounter  Medications  . traMADol (ULTRAM) 50 MG tablet    Sig: Take 1 tablet (50 mg total) by mouth every 8  (eight) hours as needed for moderate pain.    Dispense:  30 tablet    Refill:  0  . clotrimazole-betamethasone (LOTRISONE) cream    Sig: Apply 1 application topically 2 (two) times daily.    Dispense:  30 g    Refill:  1     There is no immunization history on file for this patient.  Family History  Problem Relation Age of Onset  . Breast cancer Mother   . Cancer Mother   . Diabetes Mellitus II Father   . Diabetes Father     History  Substance Use Topics  . Smoking status: Current Every Day Smoker -- 0.50 packs/day    Types: Cigarettes  . Smokeless tobacco: Current User  . Alcohol Use: No    Review of Systems   As noted in HPI  Filed Vitals:   09/13/14 1520  BP: 105/71  Pulse: 102  Temp: 98.3 F (36.8 C)  Resp: 16    Physical Exam  Physical Exam  Constitutional: No distress.  Eyes: EOM are normal. Pupils are equal, round, and reactive to light.  Cardiovascular: Normal rate and regular rhythm.   Pulmonary/Chest: Breath sounds  normal. No respiratory distress. She has no wheezes. She has no rales.  Musculoskeletal:  Right foot localized swelling/ cyst  no erythema but tenderness to touch, 2+ dorsalis pedis pulse, old surgical scar on the ankle     CBC    Component Value Date/Time   WBC 9.7 09/06/2014 1146   WBC 8.3 12/24/2013 1421   RBC 3.99 09/06/2014 1146   RBC 3.88 12/24/2013 1421   RBC 3.54* 08/26/2013 0735   HGB 11.6* 09/06/2014 1146   HGB 11.1* 12/24/2013 1421   HCT 31.7* 09/06/2014 1146   HCT 32.5* 12/24/2013 1421   PLT 78* 09/06/2014 1146   PLT 106* 12/24/2013 1421   MCV 79.4 09/06/2014 1146   MCV 83.8 12/24/2013 1421   LYMPHSABS 2.1 12/29/2013 2343   LYMPHSABS 2.1 12/24/2013 1421   MONOABS 0.6 12/29/2013 2343   MONOABS 0.4 12/24/2013 1421   EOSABS 0.2 12/29/2013 2343   EOSABS 0.2 12/24/2013 1421   BASOSABS 0.0 12/29/2013 2343   BASOSABS 0.1 12/24/2013 1421    CMP     Component Value Date/Time   NA 141 09/06/2014 1146   NA 140 12/24/2013 1421   K 4.7  09/06/2014 1146   K 3.8 12/24/2013 1421   CL 106 09/06/2014 1146   CO2 27 09/06/2014 1146   CO2 25 12/24/2013 1421   GLUCOSE 82 09/06/2014 1146   GLUCOSE 88 12/24/2013 1421   BUN 8 09/06/2014 1146   BUN 5.8* 12/24/2013 1421   CREATININE 0.75 09/06/2014 1146   CREATININE 0.8 12/24/2013 1421   CALCIUM 8.8 09/06/2014 1146   CALCIUM 8.8 12/24/2013 1421   PROT 7.3 12/29/2013 2343   PROT 7.2 12/24/2013 1421   ALBUMIN 3.6 12/29/2013 2343   ALBUMIN 3.8 12/24/2013 1421   AST 100* 12/29/2013 2343   AST 14 12/24/2013 1421   ALT 30 12/29/2013 2343   ALT 10 12/24/2013 1421   ALKPHOS 93 12/29/2013 2343   ALKPHOS 78 12/24/2013 1421   BILITOT 0.9 12/29/2013 2343   BILITOT 0.75 12/24/2013 1421   GFRNONAA >90 09/06/2014 1146   GFRAA >90 09/06/2014 1146    No results found for this basename: chol, tri, ldl    No components found with this basename: hga1c    Lab Results  Component Value Date/Time   AST 100* 12/29/2013 11:43 PM   AST 14 12/24/2013  2:21 PM    Assessment and Plan  Right foot pain - Plan: I prescribed traMADol (ULTRAM) 50 MG tablet to use when necessary also, Ambulatory referral to Podiatry  Rash and nonspecific skin eruption - Plan: clotrimazole-betamethasone (LOTRISONE) cream  Follow up as scheduled.  Lorayne Marek, MD

## 2014-09-13 NOTE — Progress Notes (Signed)
Patient states she has a metal plate in her right foot Right side of right foot swollen Complains of having a rash to her upper thighs that is very itchy and burns

## 2014-09-22 ENCOUNTER — Ambulatory Visit (INDEPENDENT_AMBULATORY_CARE_PROVIDER_SITE_OTHER): Payer: Medicaid Other

## 2014-09-22 ENCOUNTER — Encounter: Payer: Self-pay | Admitting: Podiatry

## 2014-09-22 ENCOUNTER — Ambulatory Visit (INDEPENDENT_AMBULATORY_CARE_PROVIDER_SITE_OTHER): Payer: Medicaid Other | Admitting: Podiatry

## 2014-09-22 VITALS — BP 89/53 | HR 86 | Resp 16

## 2014-09-22 DIAGNOSIS — M79671 Pain in right foot: Secondary | ICD-10-CM

## 2014-09-22 DIAGNOSIS — M779 Enthesopathy, unspecified: Secondary | ICD-10-CM

## 2014-09-22 DIAGNOSIS — M7752 Other enthesopathy of left foot: Secondary | ICD-10-CM

## 2014-09-22 DIAGNOSIS — M6740 Ganglion, unspecified site: Secondary | ICD-10-CM

## 2014-09-22 NOTE — Progress Notes (Signed)
   Subjective:    Patient ID: Tina Mayo, female    DOB: July 24, 1971, 43 y.o.   MRN: [id number]  HPI Comments: "The side of my foot hurts real bad"  Patient c/o aching, sharp lateral right foot for about several months. There are 2 small knots that have increased in size since the summer. She keeps 2 pairs of socks for cushion. She was in the ER for a rash and they checked foot and did xray.      Review of Systems  Constitutional: Positive for diaphoresis.  Musculoskeletal: Positive for back pain.  Skin: Positive for rash.  Allergic/Immunologic: Positive for food allergies.  All other systems reviewed and are negative.      Objective:   Physical Exam        Assessment & Plan:

## 2014-09-25 NOTE — Progress Notes (Signed)
Subjective:     Patient ID: Tina Mayo, female   DOB: June 18, 1971, 43 y.o.   MRN: [id number]  Foot Pain   patient presents stating I'm having a lot of pain on the outside of my right foot that's been present for about 2 months and I've noted these 2 nodules that have appeared and when pressed there sore. Does not remember having specific injury   Review of Systems  All other systems reviewed and are negative.      Objective:   Physical Exam  Nursing note and vitals reviewed. Constitutional: She is oriented to person, place, and time.  Cardiovascular: Intact distal pulses.   Musculoskeletal: Normal range of motion.  Neurological: She is oriented to person, place, and time.  Skin: Skin is warm.   neurovascular status found to be intact with muscle strength adequate and range of motion within normal limits. Patient is noted to have good digital perfusion is well oriented x3 with no equinus condition noted. 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 Proximal nerve block administered and after appropriate numbness utilizing 10 cc syringe with 20-gauge needle I was able to aspirate these masses and noted a gelatinous-type material consistent with ganglionic cyst. After complete aspiration I went ahead and put a small amount steroid under the area to try to shrink inflammation and applied compression. Explained continued compression for several weeks and if the masses were to reappear we will need to consider surgical excision. Patient will be seen back as needed

## 2015-01-30 ENCOUNTER — Emergency Department (HOSPITAL_COMMUNITY): Payer: Medicaid Other

## 2015-01-30 ENCOUNTER — Emergency Department (HOSPITAL_COMMUNITY)
Admission: EM | Admit: 2015-01-30 | Discharge: 2015-01-30 | Disposition: A | Discharge status: Home / Self Care | Payer: Medicaid Other | Attending: Emergency Medicine | Admitting: Emergency Medicine

## 2015-01-30 ENCOUNTER — Encounter (HOSPITAL_COMMUNITY): Payer: Self-pay | Admitting: Family Medicine

## 2015-01-30 DIAGNOSIS — G510 Bell's palsy: Secondary | ICD-10-CM | POA: Diagnosis not present

## 2015-01-30 DIAGNOSIS — Z79899 Other long term (current) drug therapy: Secondary | ICD-10-CM | POA: Diagnosis not present

## 2015-01-30 DIAGNOSIS — R29810 Facial weakness: Secondary | ICD-10-CM

## 2015-01-30 DIAGNOSIS — J45909 Unspecified asthma, uncomplicated: Secondary | ICD-10-CM | POA: Insufficient documentation

## 2015-01-30 DIAGNOSIS — F329 Major depressive disorder, single episode, unspecified: Secondary | ICD-10-CM | POA: Diagnosis not present

## 2015-01-30 DIAGNOSIS — F419 Anxiety disorder, unspecified: Secondary | ICD-10-CM | POA: Insufficient documentation

## 2015-01-30 DIAGNOSIS — Z791 Long term (current) use of non-steroidal anti-inflammatories (NSAID): Secondary | ICD-10-CM | POA: Insufficient documentation

## 2015-01-30 DIAGNOSIS — Z3202 Encounter for pregnancy test, result negative: Secondary | ICD-10-CM | POA: Insufficient documentation

## 2015-01-30 DIAGNOSIS — Z720 Tobacco use: Secondary | ICD-10-CM | POA: Diagnosis not present

## 2015-01-30 DIAGNOSIS — M79671 Pain in right foot: Secondary | ICD-10-CM

## 2015-01-30 LAB — URINALYSIS, ROUTINE W REFLEX MICROSCOPIC
Bilirubin Urine: NEGATIVE
GLUCOSE, UA: NEGATIVE mg/dL
HGB URINE DIPSTICK: NEGATIVE
Ketones, ur: NEGATIVE mg/dL
NITRITE: NEGATIVE
PROTEIN: NEGATIVE mg/dL
Specific Gravity, Urine: 1.016 (ref 1.005–1.030)
Urobilinogen, UA: 1 mg/dL (ref 0.0–1.0)
pH: 6.5 (ref 5.0–8.0)

## 2015-01-30 LAB — URINE MICROSCOPIC-ADD ON

## 2015-01-30 LAB — POC URINE PREG, ED: Preg Test, Ur: NEGATIVE

## 2015-01-30 MED ORDER — VALACYCLOVIR HCL 1 G PO TABS: 1000.0000 mg | ORAL_TABLET | Freq: Three times a day (TID) | ORAL | Status: AC

## 2015-01-30 MED ORDER — TRAMADOL HCL 50 MG PO TABS: 50.0000 mg | ORAL_TABLET | Freq: Three times a day (TID) | ORAL | Status: DC | PRN

## 2015-01-30 MED ORDER — PREDNISONE 20 MG PO TABS: ORAL_TABLET | ORAL | Status: AC

## 2015-01-30 MED ORDER — HYPROMELLOSE (GONIOSCOPIC) 2.5 % OP SOLN: 1.0000 [drp] | OPHTHALMIC | Status: DC | PRN

## 2015-01-30 NOTE — ED Notes (Signed)
Pt sitting on floor in Room 4. States her back hurts in that chair. Moved to Room 5.

## 2015-01-30 NOTE — Discharge Instructions (Signed)
Your facial droop is caused by Bell's Palsy. Read instruction below.  Take antiviral and prednisone as prescribed.  Use eye drop to prevent eye from getting dry.  Follow up with your doctor closely for further care.   Bell's Palsy Bell's palsy is a condition in which the muscles on one side of the face cannot move (paralysis). This is because the nerves in the face are paralyzed. It is most often thought to be caused by a virus. The virus causes swelling of the nerve that controls movement on one side of the face. The nerve travels through a tight space surrounded by bone. When the nerve swells, it can be compressed by the bone. This results in damage to the protective covering around the nerve. This damage interferes with how the nerve communicates with the muscles of the face. As a result, it can cause weakness or paralysis of the facial muscles.  Injury (trauma), tumor, and surgery may cause Bell's palsy, but most of the time the cause is unknown. It is a relatively common condition. It starts suddenly (abrupt onset) with the paralysis usually ending within 2 days. Bell's palsy is not dangerous. But because the eye does not close properly, you may need care to keep the eye from getting dry. This can include splinting (to keep the eye shut) or moistening with artificial tears. Bell's palsy very seldom occurs on both sides of the face at the same time. SYMPTOMS   Eyebrow sagging.  Drooping of the eyelid and corner of the mouth.  Inability to close one eye.  Loss of taste on the front of the tongue.  Sensitivity to loud noises. TREATMENT  The treatment is usually non-surgical. If the patient is seen within the first 24 to 48 hours, a short course of steroids may be prescribed, in an attempt to shorten the length of the condition. Antiviral medicines may also be used with the steroids, but it is unclear if they are helpful.  You will need to protect your eye, if you cannot close it. The cornea  (clear covering over your eye) will become dry and can be damaged. Artificial tears can be used to keep your eye moist. Glasses or an eye patch should be worn to protect your eye. PROGNOSIS  Recovery is variable, ranging from days to months. Although the problem usually goes away completely (about 80% of cases resolve), predicting the outcome is impossible. Most people improve within 3 weeks of when the symptoms began. Improvement may continue for 3 to 6 months. A small number of people have moderate to severe weakness that is permanent.  HOME CARE INSTRUCTIONS   If your caregiver prescribed medication to reduce swelling in the nerve, use as directed. Do not stop taking the medication unless directed by your caregiver.  Use moisturizing eye drops as needed to prevent drying of your eye, as directed by your caregiver.  Protect your eye, as directed by your caregiver.  Use facial massage and exercises, as directed by your caregiver.  Perform your normal activities, and get your normal rest. SEEK IMMEDIATE MEDICAL CARE IF:   There is pain, redness or irritation in the eye.  You or your child has an oral temperature above 102 F (38.9 C), not controlled by medicine. MAKE SURE YOU:   Understand these instructions.  Will watch your condition.  Will get help right away if you are not doing well or get worse. Document Released: 11/25/2005 Document Revised: 02/17/2012 Document Reviewed: 03/04/2014 Exit[employer] Patient Information 2015  [employer]. This information is not intended to replace advice given to you by your health care provider. Make sure you discuss any questions you have with your health care provider.

## 2015-01-30 NOTE — ED Provider Notes (Signed)
CSN: [id number]     Arrival date & time 01/30/15  1018 History  This chart was scribed for non-physician practitioner, Domenic Moras, PA-C, working with Blanchie Dessert, MD by Ladene Artist, ED Scribe. This patient was seen in room TR04C/TR04C and the patient's care was started at 11:36 AM.   Chief Complaint  Patient presents with  . Facial Droop   The history is provided by the patient. No language interpreter was used.   HPI Comments: Tina Mayo is a 44 y.o. female, with a h/o asthma, depression, anxiety, who presents to the Emergency Department complaining of constant lower L back pain for the past 2 days. Pt describes pain as 10/10 constant pressure that is exacerbated with movement. Pt denies trauma or heavy lifting. She denies chest pain, HA, dizziness, light-headedness, leg pain, numbness/tingling, any urinary symptoms. Pt has been treating with Aleve for 1 week for bruising and OTC pain pill from [employer] without relief. Pt smokes 1 pack every 3 days.   Pt also presents with sudden onset of R sided facial paralysis onset this morning. Pt states that she was brushing her teeth when she noticed that she could not move the R side of her mouth or completely close her R eye. She describes facial droop as numbness and tightness. Pt denies associated pain, facial swelling, HA, dizziness, visual disturbances, abdominal pain. No family h/o stroke or heart disease. Report possibly bitten by a spider several days ago to R upper arm.  Denies any pain, discomfort or rash to affected area.    Past Medical History  Diagnosis Date  . Depression   . Anxiety   . Asthma    Past Surgical History  Procedure Laterality Date  . Right ankle surgery      2010  . Tubal ligation    . Benign breast tumor removed      2006   Family History  Problem Relation Age of Onset  . Breast cancer Mother   . Cancer Mother   . Diabetes Mellitus II Father   . Diabetes Father    History  Substance Use Topics  .  Smoking status: Current Every Day Smoker -- 0.50 packs/day    Types: Cigarettes  . Smokeless tobacco: Current User  . Alcohol Use: No   OB History    No data available     Review of Systems  HENT: Negative for facial swelling.        Facial droop  Eyes: Negative for visual disturbance.  Cardiovascular: Negative for chest pain.  Gastrointestinal: Negative for abdominal pain.  Genitourinary: Negative.   Musculoskeletal: Positive for back pain.  Neurological: Positive for numbness. Negative for dizziness, light-headedness and headaches.   Allergies  Levaquin  Home Medications   Prior to Admission medications   Medication Sig Start Date End Date Taking? Authorizing Provider  albuterol (PROVENTIL HFA;VENTOLIN HFA) 108 (90 BASE) MCG/ACT inhaler Inhale 2 puffs into the lungs every 4 (four) hours as needed for wheezing or shortness of breath. 10/12/13   Shanker Kristeen Mans, MD  clotrimazole-betamethasone (LOTRISONE) cream Apply 1 application topically 2 (two) times daily. 09/13/14   Lorayne Marek, MD  naproxen sodium (ANAPROX) 220 MG tablet Take 440 mg by mouth 2 (two) times daily as needed (pain).    Historical Provider, MD  oxyCODONE-acetaminophen (PERCOCET/ROXICET) 5-325 MG per tablet Take 1-2 tablets by mouth every 6 (six) hours as needed for severe pain. 09/06/14   Threasa Beards, MD  traMADol (ULTRAM) 50 MG tablet Take 1  tablet (50 mg total) by mouth every 8 (eight) hours as needed for moderate pain. 09/13/14   Deepak Advani, MD   BP 118/103 mmHg  Pulse 98  Temp(Src) 98.9 F (37.2 C) (Oral)  Resp 20  SpO2 98% Physical Exam  Constitutional: She is oriented to person, place, and time. She appears well-developed and well-nourished. No distress.  HENT:  Head: Normocephalic and atraumatic.  Right Ear: Tympanic membrane normal.  Left Ear: Tympanic membrane normal.  Eyes: Conjunctivae and EOM are normal.  Neck: Normal range of motion. Neck supple. No tracheal deviation present.   Cardiovascular: Normal rate.   Pulmonary/Chest: Effort normal. No respiratory distress.  Musculoskeletal: Normal range of motion.  Pt is able to lean forward but experiences pain. Pt is able to lean backwards and side to side without pain. Pt is able to ambulate but favors L side due to pain.  Neurological: She is alert and oriented to person, place, and time. She has normal strength and normal reflexes. A cranial nerve deficit (R facial paralysis including inability to raise R brow, inability to close R eyelid and inability to open R side of mouth fully. Subjective paraesthesia to R side of face. Lost of nasal labia fold on R side. ) and sensory deficit is present. She exhibits normal muscle tone. She displays a negative Romberg sign. Coordination and gait normal. GCS eye subscore is 4. GCS verbal subscore is 5. GCS motor subscore is 6.  Reflex Scores:      Patellar reflexes are 2+ on the right side and 2+ on the left side. Normal gait. Normal strength.   Skin: Skin is warm and dry.  R upper arm: a small scab noted to R anterior shoulder, nontender without erythema, necrosis, pustular or vesicular lesion.    Psychiatric: She has a normal mood and affect. Her behavior is normal.  Nursing note and vitals reviewed.  ED Course  Procedures (including critical care time) DIAGNOSTIC STUDIES: Oxygen Saturation is 98% on RA, normal by my interpretation.    COORDINATION OF CARE: 11:39 AM-acute onset of right sided facial paralysis consistence with Bell's palsy. No other focal neuro deficit. Complaining of left flank pain without dysuria. Discussed treatment plan which includes UA and head CT with pt at bedside and pt agreed to plan. Care discussed with Dr. Maryan Rued  12:44 PM Head CT scan with normal appearance of the brain, mild to moderate paraspinal sinus inflammation noted. I suspect a Bell's palsy is likely secondary to inflammation of the seventh cranial nerve. I have low suspicion for stroke.  Plan to treat with steroid and antiviral. Patient made aware to use eyedrops to prevent her eyes from drying out. Recommend follow-up closely with her PCP as well. UA shows question of a urinary tract infection however patient denies having dysuria, urine culture sent. Patient mentioned of a spider bite to her right shoulder. No evidence to suggest Lyme disease and I have low suspicion for Lyme disease given her presentation and the time of year.  Labs Review Labs Reviewed  URINALYSIS, ROUTINE W REFLEX MICROSCOPIC - Abnormal; Notable for the following:    APPearance CLOUDY (*)    Leukocytes, UA MODERATE (*)    All other components within normal limits  URINE MICROSCOPIC-ADD ON - Abnormal; Notable for the following:    Squamous Epithelial / LPF MANY (*)    Bacteria, UA FEW (*)    All other components within normal limits  URINE CULTURE  POC URINE PREG, ED   Imaging  Review Ct Head Wo Contrast  01/30/2015   CLINICAL DATA:  44 year old female with sudden onset right facial paralysis this morning. Facial tightness. Initial encounter.  EXAM: CT HEAD WITHOUT CONTRAST  TECHNIQUE: Contiguous axial images were obtained from the base of the skull through the vertex without intravenous contrast.  COMPARISON:  None.  FINDINGS: Mild to moderate paranasal sinus mucosal thickening. Tympanic cavities and mastoids are clear. Negative visualized parotid glands. No acute osseous abnormality identified. Visualized scalp soft tissues are within normal limits. Visualized orbit soft tissues are within normal limits.  Cerebral volume is normal. No midline shift, ventriculomegaly, mass effect, evidence of mass lesion, intracranial hemorrhage or evidence of cortically based acute infarction. Gray-white matter differentiation is within normal limits throughout the brain. No suspicious intracranial vascular hyperdensity.  IMPRESSION: 1.  Normal noncontrast CT appearance of the brain. 2. Mild to moderate paranasal sinus  inflammation.   Electronically Signed   By: Genevie Ann M.D.   On: 01/30/2015 12:32    EKG Interpretation None      MDM   Final diagnoses:  Facial droop  Left-sided Bell's palsy    BP 96/66 mmHg  Pulse 84  Temp(Src) 98 F (36.7 C) (Oral)  Resp 16  SpO2 98%  I have reviewed nursing notes and vital signs. I personally reviewed the imaging tests through PACS system  I reviewed available ER/hospitalization records thought the EMR   I personally performed the services described in this documentation, which was scribed in my presence. The recorded information has been reviewed and is accurate.     Domenic Moras, PA-C 01/30/15 Jaconita, MD 01/30/15 1550

## 2015-01-30 NOTE — ED Notes (Signed)
Pt alert, talking, up in room talking on phone.

## 2015-01-30 NOTE — ED Notes (Signed)
Pt here for right sided facial paralysis since yesterday.. No other deficits noted. sts upper back pain.

## 2015-01-31 LAB — URINE CULTURE
Colony Count: >=100000
Special Requests: NORMAL

## 2015-02-15 ENCOUNTER — Ambulatory Visit: Payer: Medicaid Other | Admitting: Internal Medicine

## 2015-02-28 ENCOUNTER — Ambulatory Visit: Payer: Medicaid Other | Attending: Internal Medicine | Admitting: Internal Medicine

## 2015-02-28 ENCOUNTER — Encounter: Payer: Self-pay | Admitting: Internal Medicine

## 2015-02-28 VITALS — BP 106/75 | HR 100 | Temp 98.2°F | Resp 16 | Ht 67.0 in | Wt 190.0 lb

## 2015-02-28 DIAGNOSIS — H04121 Dry eye syndrome of right lacrimal gland: Secondary | ICD-10-CM | POA: Diagnosis not present

## 2015-02-28 DIAGNOSIS — R51 Headache: Secondary | ICD-10-CM | POA: Diagnosis not present

## 2015-02-28 DIAGNOSIS — R519 Headache, unspecified: Secondary | ICD-10-CM

## 2015-02-28 DIAGNOSIS — H538 Other visual disturbances: Secondary | ICD-10-CM | POA: Diagnosis not present

## 2015-02-28 DIAGNOSIS — G510 Bell's palsy: Secondary | ICD-10-CM | POA: Diagnosis not present

## 2015-02-28 MED ORDER — POLYVINYL ALCOHOL 1.4 % OP SOLN: 1.0000 [drp] | OPHTHALMIC | Status: DC | PRN

## 2015-02-28 MED ORDER — GABAPENTIN 300 MG PO CAPS: 300.0000 mg | ORAL_CAPSULE | Freq: Three times a day (TID) | ORAL | Status: DC

## 2015-02-28 NOTE — Progress Notes (Signed)
MRN: [id number] Name: Tina Mayo  Sex: female Age: 44 y.o. DOB: 03-19-1971  Allergies: Levaquin  Chief Complaint  Patient presents with  . Follow-up    HPI: Patient is 44 y.o. female who went to the emergency room last month with symptoms of right-sided facial paralysis, EMR reviewed patient had a CT scan done which was negative for stroke, she was told she has Bell's palsy, was treated with antiviral and steroid medication, currently she is still complaining of problem with closing her right eye, as per patient she was prescribed eyedrops which she never got and has been using over-the-counter eyedrops for dry eyes, she also has a problem with blurry vision she wears corrective glasses and has not followed up with ophthalmologist, she's also complaining of right-sided headache since she had this problem.  Past Medical History  Diagnosis Date  . Depression   . Anxiety   . Asthma     Past Surgical History  Procedure Laterality Date  . Right ankle surgery      2010  . Tubal ligation    . Benign breast tumor removed      2006      Medication List       This list is accurate as of: 02/28/15 12:35 PM.  Always use your most recent med list.               albuterol 108 (90 BASE) MCG/ACT inhaler  Commonly known as:  PROVENTIL HFA;VENTOLIN HFA  Inhale 2 puffs into the lungs every 4 (four) hours as needed for wheezing or shortness of breath.     clotrimazole-betamethasone cream  Commonly known as:  LOTRISONE  Apply 1 application topically 2 (two) times daily.     gabapentin 300 MG capsule  Commonly known as:  NEURONTIN  Take 1 capsule (300 mg total) by mouth 3 (three) times daily.     hydroxypropyl methylcellulose / hypromellose 2.5 % ophthalmic solution  Commonly known as:  ISOPTO TEARS / GONIOVISC  Place 1 drop into the right eye as needed for dry eyes.     naproxen sodium 220 MG tablet  Commonly known as:  ANAPROX  Take 440 mg by mouth 2 (two) times daily as  needed (pain).     oxyCODONE-acetaminophen 5-325 MG per tablet  Commonly known as:  PERCOCET/ROXICET  Take 1-2 tablets by mouth every 6 (six) hours as needed for severe pain.     polyvinyl alcohol 1.4 % ophthalmic solution  Commonly known as:  ARTIFICIAL TEARS  Place 1 drop into both eyes as needed for dry eyes.     traMADol 50 MG tablet  Commonly known as:  ULTRAM  Take 1 tablet (50 mg total) by mouth every 8 (eight) hours as needed for moderate pain.        Meds ordered this encounter  Medications  . gabapentin (NEURONTIN) 300 MG capsule    Sig: Take 1 capsule (300 mg total) by mouth 3 (three) times daily.    Dispense:  90 capsule    Refill:  3  . polyvinyl alcohol (ARTIFICIAL TEARS) 1.4 % ophthalmic solution    Sig: Place 1 drop into both eyes as needed for dry eyes.    Dispense:  15 mL    Refill:  0     There is no immunization history on file for this patient.  Family History  Problem Relation Age of Onset  . Breast cancer Mother   . Cancer Mother   .  Diabetes Mellitus II Father   . Diabetes Father     History  Substance Use Topics  . Smoking status: Current Every Day Smoker -- 0.50 packs/day    Types: Cigarettes  . Smokeless tobacco: Current User  . Alcohol Use: No    Review of Systems   As noted in HPI  Filed Vitals:   02/28/15 1137  BP: 106/75  Pulse: 100  Temp: 98.2 F (36.8 C)  Resp: 16    Physical Exam  Physical Exam  Constitutional: She is oriented to person, place, and time.  HENT:  Right-sided facial drooping, patient not able to completely close the right eye  Cardiovascular: Normal rate and regular rhythm.   Pulmonary/Chest: Breath sounds normal. No respiratory distress. She has no wheezes. She has no rales.  Musculoskeletal: She exhibits no edema.  Neurological: She is alert and oriented to person, place, and time. Coordination normal.    CBC    Component Value Date/Time   WBC 9.7 09/06/2014 1146   WBC 8.3 12/24/2013 1421     RBC 3.99 09/06/2014 1146   RBC 3.88 12/24/2013 1421   RBC 3.54* 08/26/2013 0735   HGB 11.6* 09/06/2014 1146   HGB 11.1* 12/24/2013 1421   HCT 31.7* 09/06/2014 1146   HCT 32.5* 12/24/2013 1421   PLT 78* 09/06/2014 1146   PLT 106* 12/24/2013 1421   MCV 79.4 09/06/2014 1146   MCV 83.8 12/24/2013 1421   LYMPHSABS 2.1 12/29/2013 2343   LYMPHSABS 2.1 12/24/2013 1421   MONOABS 0.6 12/29/2013 2343   MONOABS 0.4 12/24/2013 1421   EOSABS 0.2 12/29/2013 2343   EOSABS 0.2 12/24/2013 1421   BASOSABS 0.0 12/29/2013 2343   BASOSABS 0.1 12/24/2013 1421    CMP     Component Value Date/Time   NA 141 09/06/2014 1146   NA 140 12/24/2013 1421   K 4.7 09/06/2014 1146   K 3.8 12/24/2013 1421   CL 106 09/06/2014 1146   CO2 27 09/06/2014 1146   CO2 25 12/24/2013 1421   GLUCOSE 82 09/06/2014 1146   GLUCOSE 88 12/24/2013 1421   BUN 8 09/06/2014 1146   BUN 5.8* 12/24/2013 1421   CREATININE 0.75 09/06/2014 1146   CREATININE 0.8 12/24/2013 1421   CALCIUM 8.8 09/06/2014 1146   CALCIUM 8.8 12/24/2013 1421   PROT 7.3 12/29/2013 2343   PROT 7.2 12/24/2013 1421   ALBUMIN 3.6 12/29/2013 2343   ALBUMIN 3.8 12/24/2013 1421   AST 100* 12/29/2013 2343   AST 14 12/24/2013 1421   ALT 30 12/29/2013 2343   ALT 10 12/24/2013 1421   ALKPHOS 93 12/29/2013 2343   ALKPHOS 78 12/24/2013 1421   BILITOT 0.9 12/29/2013 2343   BILITOT 0.75 12/24/2013 1421   GFRNONAA >90 09/06/2014 1146   GFRAA >90 09/06/2014 1146    No results found for: CHOL  No components found for: HGA1C  Lab Results  Component Value Date/Time   AST 100* 12/29/2013 11:43 PM   AST 14 12/24/2013 02:21 PM    Assessment and Plan  Bell's palsy - Plan: have explained to the patient that sometimes takes weeks to months to completel recovery, patient has already been treated with steroids, will try gabapentin (NEURONTIN) 300 MG capsule,for symptomatic relief, Ambulatory referral to Ophthalmology  Headache, unspecified headache type -  Plan: gabapentin (NEURONTIN) 300 MG capsule  Dry eyes, right - Plan: polyvinyl alcohol (ARTIFICIAL TEARS) 1.4 % ophthalmic solution, patient will continue to keep the eye closed with the patch at night  Blurry vision - Plan: Ambulatory referral to Ophthalmology  Return in about 3 months (around 05/31/2015), or if symptoms worsen or fail to improve.   This note has been created with Surveyor, quantity. Any transcriptional errors are unintentional.    Lorayne Marek, MD

## 2015-02-28 NOTE — Patient Instructions (Signed)
Bell's Palsy °Bell's palsy is a condition in which the muscles on one side of the face cannot move (paralysis). This is because the nerves in the face are paralyzed. It is most often thought to be caused by a virus. The virus causes swelling of the nerve that controls movement on one side of the face. The nerve travels through a tight space surrounded by bone. When the nerve swells, it can be compressed by the bone. This results in damage to the protective covering around the nerve. This damage interferes with how the nerve communicates with the muscles of the face. As a result, it can cause weakness or paralysis of the facial muscles.  °Injury (trauma), tumor, and surgery may cause Bell's palsy, but most of the time the cause is unknown. It is a relatively common condition. It starts suddenly (abrupt onset) with the paralysis usually ending within 2 days. Bell's palsy is not dangerous. But because the eye does not close properly, you may need care to keep the eye from getting dry. This can include splinting (to keep the eye shut) or moistening with artificial tears. Bell's palsy very seldom occurs on both sides of the face at the same time. °SYMPTOMS  °· Eyebrow sagging. °· Drooping of the eyelid and corner of the mouth. °· Inability to close one eye. °· Loss of taste on the front of the tongue. °· Sensitivity to loud noises. °TREATMENT  °The treatment is usually non-surgical. If the patient is seen within the first 24 to 48 hours, a short course of steroids may be prescribed, in an attempt to shorten the length of the condition. Antiviral medicines may also be used with the steroids, but it is unclear if they are helpful.  °You will need to protect your eye, if you cannot close it. The cornea (clear covering over your eye) will become dry and can be damaged. Artificial tears can be used to keep your eye moist. Glasses or an eye patch should be worn to protect your eye. °PROGNOSIS  °Recovery is variable, ranging  from days to months. Although the problem usually goes away completely (about 80% of cases resolve), predicting the outcome is impossible. Most people improve within 3 weeks of when the symptoms began. Improvement may continue for 3 to 6 months. A small number of people have moderate to severe weakness that is permanent.  °HOME CARE INSTRUCTIONS  °· If your caregiver prescribed medication to reduce swelling in the nerve, use as directed. Do not stop taking the medication unless directed by your caregiver. °· Use moisturizing eye drops as needed to prevent drying of your eye, as directed by your caregiver. °· Protect your eye, as directed by your caregiver. °· Use facial massage and exercises, as directed by your caregiver. °· Perform your normal activities, and get your normal rest. °SEEK IMMEDIATE MEDICAL CARE IF:  °· There is pain, redness or irritation in the eye. °· You or your child has an oral temperature above 102° F (38.9° C), not controlled by medicine. °MAKE SURE YOU:  °· Understand these instructions. °· Will watch your condition. °· Will get help right away if you are not doing well or get worse. °Document Released: 11/25/2005 Document Revised: 02/17/2012 Document Reviewed: 03/04/2014 °[employer]® Patient Information ©2015 [employer]. This information is not intended to replace advice given to you by your health care provider. Make sure you discuss any questions you have with your health care provider. ° °

## 2015-02-28 NOTE — Progress Notes (Signed)
Pt is here wanting to discuss her new diagnosis of bell's palsy. Pt states that now she has frequent headaches. She said that now her face is drooping its causing her to have pressure and pain in her mouth.

## 2015-03-07 ENCOUNTER — Ambulatory Visit: Payer: Medicaid Other | Admitting: Internal Medicine

## 2015-04-17 ENCOUNTER — Encounter (HOSPITAL_COMMUNITY): Payer: Self-pay | Admitting: Cardiology

## 2015-04-17 ENCOUNTER — Emergency Department (HOSPITAL_COMMUNITY)
Admission: EM | Admit: 2015-04-17 | Discharge: 2015-04-17 | Disposition: A | Discharge status: Home / Self Care | Payer: Medicaid Other | Attending: Emergency Medicine | Admitting: Emergency Medicine

## 2015-04-17 DIAGNOSIS — J45909 Unspecified asthma, uncomplicated: Secondary | ICD-10-CM | POA: Diagnosis not present

## 2015-04-17 DIAGNOSIS — Z791 Long term (current) use of non-steroidal anti-inflammatories (NSAID): Secondary | ICD-10-CM | POA: Diagnosis not present

## 2015-04-17 DIAGNOSIS — F329 Major depressive disorder, single episode, unspecified: Secondary | ICD-10-CM | POA: Diagnosis not present

## 2015-04-17 DIAGNOSIS — Z79899 Other long term (current) drug therapy: Secondary | ICD-10-CM | POA: Diagnosis not present

## 2015-04-17 DIAGNOSIS — M79604 Pain in right leg: Secondary | ICD-10-CM | POA: Diagnosis present

## 2015-04-17 DIAGNOSIS — F419 Anxiety disorder, unspecified: Secondary | ICD-10-CM | POA: Diagnosis not present

## 2015-04-17 DIAGNOSIS — Z720 Tobacco use: Secondary | ICD-10-CM | POA: Diagnosis not present

## 2015-04-17 MED ORDER — IBUPROFEN 600 MG PO TABS: 600.0000 mg | ORAL_TABLET | Freq: Four times a day (QID) | ORAL | Status: DC | PRN

## 2015-04-17 MED ORDER — DIAZEPAM 5 MG PO TABS
5.0000 mg | ORAL_TABLET | Freq: Once | ORAL | Status: AC
  Administered 2015-04-17: 5 mg via ORAL
  Filled 2015-04-17: qty 1

## 2015-04-17 MED ORDER — KETOROLAC TROMETHAMINE 60 MG/2ML IM SOLN
60.0000 mg | Freq: Once | INTRAMUSCULAR | Status: AC
  Administered 2015-04-17: 60 mg via INTRAMUSCULAR
  Filled 2015-04-17: qty 2

## 2015-04-17 NOTE — Discharge Instructions (Signed)
You were evaluated in the ED for your leg pain. There does not appear to be an emergent cause for your leg pain at this time. He reported feeling better after medications administered in the ED. Please take these medications at home for your discomfort. Please follow-up with primary care for further eval and management of your symptoms.  Musculoskeletal Pain Musculoskeletal pain is muscle and boney aches and pains. These pains can occur in any part of the body. Your caregiver may treat you without knowing the cause of the pain. They may treat you if blood or urine tests, X-rays, and other tests were normal.  CAUSES There is often not a definite cause or reason for these pains. These pains may be caused by a type of germ (virus). The discomfort may also come from overuse. Overuse includes working out too hard when your body is not fit. Boney aches also come from weather changes. Bone is sensitive to atmospheric pressure changes. HOME CARE INSTRUCTIONS   Ask when your test results will be ready. Make sure you get your test results.  Only take over-the-counter or prescription medicines for pain, discomfort, or fever as directed by your caregiver. If you were given medications for your condition, do not drive, operate machinery or power tools, or sign legal documents for 24 hours. Do not drink alcohol. Do not take sleeping pills or other medications that may interfere with treatment.  Continue all activities unless the activities cause more pain. When the pain lessens, slowly resume normal activities. Gradually increase the intensity and duration of the activities or exercise.  During periods of severe pain, bed rest may be helpful. Lay or sit in any position that is comfortable.  Putting ice on the injured area.  Put ice in a bag.  Place a towel between your skin and the bag.  Leave the ice on for 15 to 20 minutes, 3 to 4 times a day.  Follow up with your caregiver for continued problems and no  reason can be found for the pain. If the pain becomes worse or does not go away, it may be necessary to repeat tests or do additional testing. Your caregiver may need to look further for a possible cause. SEEK IMMEDIATE MEDICAL CARE IF:  You have pain that is getting worse and is not relieved by medications.  You develop chest pain that is associated with shortness or breath, sweating, feeling sick to your stomach (nauseous), or throw up (vomit).  Your pain becomes localized to the abdomen.  You develop any new symptoms that seem different or that concern you. MAKE SURE YOU:   Understand these instructions.  Will watch your condition.  Will get help right away if you are not doing well or get worse. Document Released: 11/25/2005 Document Revised: 02/17/2012 Document Reviewed: 07/30/2013 [facility] Patient Information 2015 [place], [place]. This information is not intended to replace advice given to you by your health care provider. Make sure you discuss any questions you have with your health care provider.

## 2015-04-17 NOTE — ED Notes (Signed)
Pt reports right leg pain that started yesterday. Reports she feels like she has a "charile horse" in her leg.

## 2015-04-17 NOTE — ED Provider Notes (Signed)
CSN: [id number]     Arrival date & time 04/17/15  1320 History  This chart was scribed for non-physician practitioner, Comer Locket, PA-C, working with Noemi Chapel, MD, by Stephania Fragmin, ED Scribe. This patient was seen in room TR08C/TR08C and the patient's care was started at 3:01 PM.    Chief Complaint  Patient presents with  . Leg Pain   The history is provided by the patient. No language interpreter was used.     HPI Comments: Tina Mayo is a 44 y.o. female who presents to the Emergency Department complaining of acute onset, constant, worsening, severe right leg pain that began yesterday when she came home from church. She denies any known trauma or injury. Patient originally thought this was a "charlie horse" but it has not improved with time and rest. She has taken muscle relaxants, 2 Tylenol PMs, and Motrin; as well as applied IcyHot and other OTC, with no relief. She reports a history of "blood clots all the time" (points to superficial scabs on legs) but denies any diagnosis of DVT, PE in the past. She denies hemoptysis. She also denies recent travel, recent surgeries, or BCP use, denies a personal history of CA.   Past Medical History  Diagnosis Date  . Depression   . Anxiety   . Asthma    Past Surgical History  Procedure Laterality Date  . Right ankle surgery      2010  . Tubal ligation    . Benign breast tumor removed      2006   Family History  Problem Relation Age of Onset  . Breast cancer Mother   . Cancer Mother   . Diabetes Mellitus II Father   . Diabetes Father    History  Substance Use Topics  . Smoking status: Current Every Day Smoker -- 0.50 packs/day    Types: Cigarettes  . Smokeless tobacco: Current User  . Alcohol Use: No   OB History    No data available     Review of Systems  Musculoskeletal: Positive for myalgias.  All other systems reviewed and are negative.   Allergies  Levaquin  Home Medications   Prior to Admission medications    Medication Sig Start Date End Date Taking? Authorizing Provider  albuterol (PROVENTIL HFA;VENTOLIN HFA) 108 (90 BASE) MCG/ACT inhaler Inhale 2 puffs into the lungs every 4 (four) hours as needed for wheezing or shortness of breath. Patient not taking: Reported on 02/28/2015 10/12/13   Jonetta Osgood, MD  clotrimazole-betamethasone (LOTRISONE) cream Apply 1 application topically 2 (two) times daily. Patient not taking: Reported on 02/28/2015 09/13/14   Lorayne Marek, MD  gabapentin (NEURONTIN) 300 MG capsule Take 1 capsule (300 mg total) by mouth 3 (three) times daily. 02/28/15   Lorayne Marek, MD  hydroxypropyl methylcellulose / hypromellose (ISOPTO TEARS / GONIOVISC) 2.5 % ophthalmic solution Place 1 drop into the right eye as needed for dry eyes. Patient not taking: Reported on 02/28/2015 01/30/15   Domenic Moras, PA-C  ibuprofen (ADVIL,MOTRIN) 600 MG tablet Take 1 tablet (600 mg total) by mouth every 6 (six) hours as needed. 04/17/15   Comer Locket, PA-C  naproxen sodium (ANAPROX) 220 MG tablet Take 440 mg by mouth 2 (two) times daily as needed (pain).    Historical Provider, MD  oxyCODONE-acetaminophen (PERCOCET/ROXICET) 5-325 MG per tablet Take 1-2 tablets by mouth every 6 (six) hours as needed for severe pain. Patient not taking: Reported on 02/28/2015 09/06/14   Alfonzo Beers, MD  polyvinyl alcohol (  ARTIFICIAL TEARS) 1.4 % ophthalmic solution Place 1 drop into both eyes as needed for dry eyes. 02/28/15   Lorayne Marek, MD  traMADol (ULTRAM) 50 MG tablet Take 1 tablet (50 mg total) by mouth every 8 (eight) hours as needed for moderate pain. Patient not taking: Reported on 02/28/2015 01/30/15   Domenic Moras, PA-C   BP 107/70 mmHg  Pulse 80  Temp(Src) 98.5 F (36.9 C) (Oral)  Resp 18  Ht 5\' 7"  (1.702 m)  Wt 192 lb (87.091 kg)  BMI 30.06 kg/m2  SpO2 99%  LMP 03/08/2015 Physical Exam  Constitutional: She appears well-developed and well-nourished. No distress.  HENT:  Head: Normocephalic and  atraumatic.  Eyes: Right eye exhibits no discharge. Left eye exhibits no discharge.  Pulmonary/Chest: Effort normal. No respiratory distress.  Musculoskeletal:       Right lower leg: She exhibits tenderness.  Tenderness along lateral aspect of right lower extremity along IT band. No leg swelling or edema. No erythema or warmth. Muscle compartments are soft.  Negative Homan's. no obvious lesions.  Neurological: She is alert. Coordination normal.  Skin: No rash noted. She is not diaphoretic.  Psychiatric: She has a normal mood and affect. Her behavior is normal.  Nursing note and vitals reviewed.   ED Course  Procedures (including critical care time)  DIAGNOSTIC STUDIES: Oxygen Saturation is 99% on RA, normal by my interpretation.    COORDINATION OF CARE: 3:09 PM - Discussed treatment plan with pt at bedside which includes analgesic medications, and pt agreed to plan.  MDM  Vitals stable - WNL -afebrile Pt resting comfortably in ED. reports she feels much better after administration of medications in the ED. PE --Not concerning further acute or emergent pathology. No clinical sign of DVT.   DDX--leg pain likely due to musculoskeletal pain. No evidence of DVT on exam. PERC negative.  Doubt necrotizing fasciitis, compartment syndrome. No injury. No evidence of other acute or emergent pathology at this time. Patient felt better with administration of NSAIDs in the ED, will DC with the same. Encourage follow-up with primary care for further evaluation and management of symptoms.  I discussed all relevant lab findings and imaging results with pt and they verbalized understanding. Discussed f/u with PCP within 48 hrs and return precautions, pt very amenable to plan.  Final diagnoses:  Leg pain, lateral, right     I personally performed the services described in this documentation, which was scribed in my presence. The recorded information has been reviewed and is  accurate.     Comer Locket, PA-C 04/18/15 0820  Noemi Chapel, MD 04/18/15 [phone]

## 2015-04-17 NOTE — ED Notes (Signed)
No answer x1

## 2015-04-25 ENCOUNTER — Encounter: Payer: Self-pay | Admitting: Internal Medicine

## 2015-04-25 ENCOUNTER — Ambulatory Visit: Payer: Medicaid Other | Attending: Internal Medicine | Admitting: Internal Medicine

## 2015-04-25 VITALS — BP 114/76 | HR 80 | Temp 98.0°F | Resp 16 | Wt 185.0 lb

## 2015-04-25 DIAGNOSIS — T148 Other injury of unspecified body region: Secondary | ICD-10-CM

## 2015-04-25 DIAGNOSIS — R252 Cramp and spasm: Secondary | ICD-10-CM | POA: Diagnosis not present

## 2015-04-25 DIAGNOSIS — Z720 Tobacco use: Secondary | ICD-10-CM | POA: Insufficient documentation

## 2015-04-25 DIAGNOSIS — T148XXA Other injury of unspecified body region, initial encounter: Secondary | ICD-10-CM

## 2015-04-25 DIAGNOSIS — R229 Localized swelling, mass and lump, unspecified: Secondary | ICD-10-CM | POA: Diagnosis not present

## 2015-04-25 DIAGNOSIS — D696 Thrombocytopenia, unspecified: Secondary | ICD-10-CM | POA: Diagnosis not present

## 2015-04-25 LAB — COMPLETE METABOLIC PANEL WITH GFR
ALK PHOS: 70 U/L (ref 39–117)
ALT: 13 U/L (ref 0–35)
AST: 15 U/L (ref 0–37)
Albumin: 4.2 g/dL (ref 3.5–5.2)
BILIRUBIN TOTAL: 1.1 mg/dL (ref 0.2–1.2)
BUN: 11 mg/dL (ref 6–23)
CALCIUM: 8.9 mg/dL (ref 8.4–10.5)
CO2: 22 mEq/L (ref 19–32)
Chloride: 107 mEq/L (ref 96–112)
Creat: 0.78 mg/dL (ref 0.50–1.10)
GFR, Est Non African American: >89 mL/min
Glucose, Bld: 91 mg/dL (ref 70–99)
Potassium: 5.1 mEq/L (ref 3.5–5.3)
Sodium: 138 mEq/L (ref 135–145)
Total Protein: 7.5 g/dL (ref 6.0–8.3)

## 2015-04-25 MED ORDER — CYCLOBENZAPRINE HCL 10 MG PO TABS: 10.0000 mg | ORAL_TABLET | Freq: Every day | ORAL | Status: DC

## 2015-04-25 NOTE — Progress Notes (Signed)
MRN: [id number] Name: Tina Mayo  Sex: female Age: 44 y.o. DOB: Feb 10, 1971  Allergies: Levaquin  Chief Complaint  Patient presents with  . Leg Pain    HPI: Patient is 44 y.o. female who recently went to the emergency room with symptoms of leg cramps/pain, she was given some pain medication and discharged, she had an ultrasound done on lower extremities in September which was negative for DVT, he was medical record reviewed she used to follow up with oncologist has trace sickle cell tract as well as thrombocytopenia, she has not had any recent blood work done she denies any bleeding but have noticed bruises on her legs as well as small nodules under the skin which are sometimes painful. As per patient she has not taking iron supplements currently. Patient has lost follow up with her oncologist. She is requesting some medication to help her with the cramps/pain, she is already taking Neurontin. Patient has history of Bell's palsy. Patient is to smoke cigarettes, I have counseled patient to quit smoking.  Past Medical History  Diagnosis Date  . Depression   . Anxiety   . Asthma     Past Surgical History  Procedure Laterality Date  . Right ankle surgery      2010  . Tubal ligation    . Benign breast tumor removed      2006      Medication List       This list is accurate as of: 04/25/15 11:07 AM.  Always use your most recent med list.               albuterol 108 (90 BASE) MCG/ACT inhaler  Commonly known as:  PROVENTIL HFA;VENTOLIN HFA  Inhale 2 puffs into the lungs every 4 (four) hours as needed for wheezing or shortness of breath.     clotrimazole-betamethasone cream  Commonly known as:  LOTRISONE  Apply 1 application topically 2 (two) times daily.     cyclobenzaprine 10 MG tablet  Commonly known as:  FLEXERIL  Take 1 tablet (10 mg total) by mouth at bedtime.     gabapentin 300 MG capsule  Commonly known as:  NEURONTIN  Take 1 capsule (300 mg total) by mouth  3 (three) times daily.     hydroxypropyl methylcellulose / hypromellose 2.5 % ophthalmic solution  Commonly known as:  ISOPTO TEARS / GONIOVISC  Place 1 drop into the right eye as needed for dry eyes.     ibuprofen 600 MG tablet  Commonly known as:  ADVIL,MOTRIN  Take 1 tablet (600 mg total) by mouth every 6 (six) hours as needed.     naproxen sodium 220 MG tablet  Commonly known as:  ANAPROX  Take 440 mg by mouth 2 (two) times daily as needed (pain).     oxyCODONE-acetaminophen 5-325 MG per tablet  Commonly known as:  PERCOCET/ROXICET  Take 1-2 tablets by mouth every 6 (six) hours as needed for severe pain.     polyvinyl alcohol 1.4 % ophthalmic solution  Commonly known as:  ARTIFICIAL TEARS  Place 1 drop into both eyes as needed for dry eyes.     traMADol 50 MG tablet  Commonly known as:  ULTRAM  Take 1 tablet (50 mg total) by mouth every 8 (eight) hours as needed for moderate pain.        Meds ordered this encounter  Medications  . cyclobenzaprine (FLEXERIL) 10 MG tablet    Sig: Take 1 tablet (10 mg total) by  mouth at bedtime.    Dispense:  30 tablet    Refill:  2     There is no immunization history on file for this patient.  Family History  Problem Relation Age of Onset  . Breast cancer Mother   . Cancer Mother   . Diabetes Mellitus II Father   . Diabetes Father     History  Substance Use Topics  . Smoking status: Current Every Day Smoker -- 0.50 packs/day    Types: Cigarettes  . Smokeless tobacco: Current User  . Alcohol Use: No    Review of Systems   As noted in HPI  Filed Vitals:   04/25/15 1030  BP: 114/76  Pulse: 80  Temp: 98 F (36.7 C)  Resp: 16    Physical Exam  Physical Exam  Constitutional: No distress.  Eyes: EOM are normal. Pupils are equal, round, and reactive to light.  Cardiovascular: Normal rate and regular rhythm.   Pulmonary/Chest: Breath sounds normal. No respiratory distress. She has no wheezes. She has no rales.    Musculoskeletal:  Bi;atera; calf  no tenderness, small bruise noticed on the left high, small nodule nontender on the right thigh.    CBC    Component Value Date/Time   WBC 9.7 09/06/2014 1146   WBC 8.3 12/24/2013 1421   RBC 3.99 09/06/2014 1146   RBC 3.88 12/24/2013 1421   RBC 3.54* 08/26/2013 0735   HGB 11.6* 09/06/2014 1146   HGB 11.1* 12/24/2013 1421   HCT 31.7* 09/06/2014 1146   HCT 32.5* 12/24/2013 1421   PLT 78* 09/06/2014 1146   PLT 106* 12/24/2013 1421   MCV 79.4 09/06/2014 1146   MCV 83.8 12/24/2013 1421   LYMPHSABS 2.1 12/29/2013 2343   LYMPHSABS 2.1 12/24/2013 1421   MONOABS 0.6 12/29/2013 2343   MONOABS 0.4 12/24/2013 1421   EOSABS 0.2 12/29/2013 2343   EOSABS 0.2 12/24/2013 1421   BASOSABS 0.0 12/29/2013 2343   BASOSABS 0.1 12/24/2013 1421    CMP     Component Value Date/Time   NA 141 09/06/2014 1146   NA 140 12/24/2013 1421   K 4.7 09/06/2014 1146   K 3.8 12/24/2013 1421   CL 106 09/06/2014 1146   CO2 27 09/06/2014 1146   CO2 25 12/24/2013 1421   GLUCOSE 82 09/06/2014 1146   GLUCOSE 88 12/24/2013 1421   BUN 8 09/06/2014 1146   BUN 5.8* 12/24/2013 1421   CREATININE 0.75 09/06/2014 1146   CREATININE 0.8 12/24/2013 1421   CALCIUM 8.8 09/06/2014 1146   CALCIUM 8.8 12/24/2013 1421   PROT 7.3 12/29/2013 2343   PROT 7.2 12/24/2013 1421   ALBUMIN 3.6 12/29/2013 2343   ALBUMIN 3.8 12/24/2013 1421   AST 100* 12/29/2013 2343   AST 14 12/24/2013 1421   ALT 30 12/29/2013 2343   ALT 10 12/24/2013 1421   ALKPHOS 93 12/29/2013 2343   ALKPHOS 78 12/24/2013 1421   BILITOT 0.9 12/29/2013 2343   BILITOT 0.75 12/24/2013 1421   GFRNONAA >90 09/06/2014 1146   GFRAA >90 09/06/2014 1146    No results found for: CHOL  No results found for: HGBA1C  Lab Results  Component Value Date/Time   AST 100* 12/29/2013 11:43 PM   AST 14 12/24/2013 02:21 PM    Assessment and Plan  Cramps of lower extremity, unspecified laterality - Plan: to check blood chemistry  COMPLETE METABOLIC PANEL WITH GFR, trial of cyclobenzaprine (FLEXERIL) 10 MG tablet  Thrombocytopenia - Plan:repeat  CBC with Differential/Platelet  Bruising - Plan: CBC with Differential/Platelet  Tobacco abuse Counseled patient to quit smoking  Skin nodule - Plan: nontender will check CBC with Differential/Platelet   Health Maintenance  -Pap Smear:will be scheduled   Return in about 3 months (around 07/26/2015), or if symptoms worsen or fail to improve, for Schedule Appt with Dr Burman Freestone for PAP.   This note has been created with Surveyor, quantity. Any transcriptional errors are unintentional.    Lorayne Marek, MD

## 2015-04-25 NOTE — Progress Notes (Signed)
Patient here for follow up from the ED Was seen in the hospital for a "charlie horse" to her right leg Patient is still having pain Patient also complains of having "knots" to bilateral legs and not sure why she keeps Getting these

## 2015-04-26 LAB — CBC WITH DIFFERENTIAL/PLATELET
Basophils Absolute: 0 10*3/uL (ref 0.0–0.1)
Basophils Relative: 0 % (ref 0–1)
EOS PCT: 3 % (ref 0–5)
Eosinophils Absolute: 0.3 10*3/uL (ref 0.0–0.7)
HEMATOCRIT: 36.2 % (ref 36.0–46.0)
HEMOGLOBIN: 12.4 g/dL (ref 12.0–15.0)
LYMPHS ABS: 2.3 10*3/uL (ref 0.7–4.0)
LYMPHS PCT: 26 % (ref 12–46)
MCH: 29.3 pg (ref 26.0–34.0)
MCHC: 34.3 g/dL (ref 30.0–36.0)
MCV: 85.6 fL (ref 78.0–100.0)
MONOS PCT: 6 % (ref 3–12)
Monocytes Absolute: 0.5 10*3/uL (ref 0.1–1.0)
Neutro Abs: 5.7 10*3/uL (ref 1.7–7.7)
Neutrophils Relative %: 65 % (ref 43–77)
Platelets: 84 10*3/uL — ABNORMAL LOW (ref 150–400)
RBC: 4.23 MIL/uL (ref 3.87–5.11)
RDW: 16.1 % — ABNORMAL HIGH (ref 11.5–15.5)
WBC: 8.7 10*3/uL (ref 4.0–10.5)

## 2015-04-27 ENCOUNTER — Telehealth: Payer: Self-pay

## 2015-04-27 NOTE — Telephone Encounter (Signed)
Pt called, returning nurse's phone call to review results. Please f/u

## 2015-04-27 NOTE — Telephone Encounter (Signed)
-----   Message from Lorayne Marek, MD sent at 04/26/2015  5:42 PM EDT ----- Call and let the patient know that her CBC shows improvement,platelet count is improved, also her blood chemistries normal.

## 2015-04-27 NOTE — Telephone Encounter (Signed)
Patient not available Left message on voice mail to return our call 

## 2015-04-27 NOTE — Telephone Encounter (Signed)
Pt returning nurse's call, pt worried about what results could be, please f/u with pt.

## 2015-05-02 ENCOUNTER — Other Ambulatory Visit (HOSPITAL_COMMUNITY)
Admission: RE | Admit: 2015-05-02 | Discharge: 2015-05-02 | Disposition: A | Discharge status: Home / Self Care | Payer: Medicaid Other | Source: Ambulatory Visit | Attending: Family Medicine | Admitting: Family Medicine

## 2015-05-02 ENCOUNTER — Encounter: Payer: Self-pay | Admitting: Family Medicine

## 2015-05-02 ENCOUNTER — Ambulatory Visit: Payer: Medicaid Other | Attending: Family Medicine | Admitting: Family Medicine

## 2015-05-02 VITALS — BP 106/75 | HR 73 | Wt 184.2 lb

## 2015-05-02 DIAGNOSIS — Z01411 Encounter for gynecological examination (general) (routine) with abnormal findings: Secondary | ICD-10-CM | POA: Diagnosis present

## 2015-05-02 DIAGNOSIS — Z1151 Encounter for screening for human papillomavirus (HPV): Secondary | ICD-10-CM | POA: Diagnosis present

## 2015-05-02 DIAGNOSIS — N898 Other specified noninflammatory disorders of vagina: Secondary | ICD-10-CM

## 2015-05-02 DIAGNOSIS — Z113 Encounter for screening for infections with a predominantly sexual mode of transmission: Secondary | ICD-10-CM | POA: Insufficient documentation

## 2015-05-02 DIAGNOSIS — N951 Menopausal and female climacteric states: Secondary | ICD-10-CM

## 2015-05-02 DIAGNOSIS — Z124 Encounter for screening for malignant neoplasm of cervix: Secondary | ICD-10-CM

## 2015-05-02 DIAGNOSIS — A599 Trichomoniasis, unspecified: Secondary | ICD-10-CM

## 2015-05-02 DIAGNOSIS — G4700 Insomnia, unspecified: Secondary | ICD-10-CM | POA: Diagnosis not present

## 2015-05-02 DIAGNOSIS — N760 Acute vaginitis: Secondary | ICD-10-CM | POA: Insufficient documentation

## 2015-05-02 MED ORDER — METRONIDAZOLE 500 MG PO TABS: 500.0000 mg | ORAL_TABLET | Freq: Two times a day (BID) | ORAL | Status: DC

## 2015-05-02 MED ORDER — FLUCONAZOLE 150 MG PO TABS: 150.0000 mg | ORAL_TABLET | Freq: Once | ORAL | Status: DC

## 2015-05-02 NOTE — Assessment & Plan Note (Addendum)
Insomnia with what sounds like restless leg syndrome Try tryptophan

## 2015-05-02 NOTE — Progress Notes (Signed)
   Subjective:    Patient ID: Tina Mayo, female    DOB: [birth year], 44 y.o.   MRN: [id number] CC; pap smear  PCP: [facility]  HPI  1. Pap smear: no hx of abnormal paps. Some vaginal discharge. No pelvic pain or bleeding.   2. Hot flashes: x 2 months. Skipped periods. Patient is a smoker. Drinks one cup of coffee daily in AM.   3. Insomnia: x 3 years. Has tried many different sleeps aids. Has feeling of leg irritability at night time. Has not tried tryptophan.   Soc Hx: current smoker  Review of Systems  Constitutional: Positive for diaphoresis. Negative for fever and chills.  Endocrine: Positive for heat intolerance.  Genitourinary: Positive for vaginal discharge and menstrual problem. Negative for decreased urine volume and vaginal pain.  Psychiatric/Behavioral: Positive for sleep disturbance.       Objective:   Physical Exam BP 106/75 mmHg  Pulse 73  Wt 184 lb 3.2 oz (83.553 kg)  SpO2 100%  LMP 03/08/2015 General appearance: alert, cooperative and no distress Abdomen: soft, non-tender; bowel sounds normal; no masses,  no organomegaly Pelvic: cervix normal in appearance, external genitalia normal, no adnexal masses or tenderness, no cervical motion tenderness, positive findings: vaginal discharge:  copious, white and malodorous, rectovaginal septum normal and uterus normal size, shape, and consistency   Pap done today     Assessment & Plan:

## 2015-05-02 NOTE — Assessment & Plan Note (Signed)
Pap done today  

## 2015-05-02 NOTE — Patient Instructions (Addendum)
Tina Mayo,  Thank you for coming in today  1. Here for screening pap: pap done today.  2. Hot flashes and skipped periods. Perimenopause. See info below. Evening primrose can help  3. Vaginal discharge: suspect BV Take flagyl for 7 days Take diflucan after flagyl to prevent yeast Avoid semen in vagina  4. Insomnia with what sounds like restless leg syndrome Try tryptophan   You will be called with results  Dr. Adrian Blackwater  F/u with Dr. Annitta Needs in 3-4 weeks to discuss insomnia with what sound like restless leg syndrome and other possible treatment options bring a list of what you have tried and whether or not it helped  Perimenopause Perimenopause is the time when your body begins to move into the menopause (no menstrual period for 12 straight months). It is a natural process. Perimenopause can begin 2-8 years before the menopause and usually lasts for 1 year after the menopause. During this time, your ovaries may or may not produce an egg. The ovaries vary in their production of estrogen and progesterone hormones each month. This can cause irregular menstrual periods, difficulty getting pregnant, vaginal bleeding between periods, and uncomfortable symptoms. CAUSES  Irregular production of the ovarian hormones, estrogen and progesterone, and not ovulating every month.  Other causes include:  Tumor of the pituitary gland in the brain.  Medical disease that affects the ovaries.  Radiation treatment.  Chemotherapy.  Unknown causes.  Heavy smoking and excessive alcohol intake can bring on perimenopause sooner. SIGNS AND SYMPTOMS   Hot flashes.  Night sweats.  Irregular menstrual periods.  Decreased sex drive.  Vaginal dryness.  Headaches.  Mood swings.  Depression.  Memory problems.  Irritability.  Tiredness.  Weight gain.  Trouble getting pregnant.  The beginning of losing bone cells (osteoporosis).  The beginning of hardening of the arteries  (atherosclerosis). DIAGNOSIS  Your health care provider will make a diagnosis by analyzing your age, menstrual history, and symptoms. He or she will do a physical exam and note any changes in your body, especially your female organs. Female hormone tests may or may not be helpful depending on the amount of female hormones you produce and when you produce them. However, other hormone tests may be helpful to rule out other problems. TREATMENT  In some cases, no treatment is needed. The decision on whether treatment is necessary during the perimenopause should be made by you and your health care provider based on how the symptoms are affecting you and your lifestyle. Various treatments are available, such as:  Treating individual symptoms with a specific medicine for that symptom.  Herbal medicines that can help specific symptoms.  Counseling.  Group therapy. HOME CARE INSTRUCTIONS   Keep track of your menstrual periods (when they occur, how heavy they are, how long between periods, and how long they last) as well as your symptoms and when they started.  Only take over-the-counter or prescription medicines as directed by your health care provider.  Sleep and rest.  Exercise.  Eat a diet that contains calcium (good for your bones) and soy (acts like the estrogen hormone).  Do not smoke.  Avoid alcoholic beverages.  Take vitamin supplements as recommended by your health care provider. Taking vitamin E may help in certain cases.  Take calcium and vitamin D supplements to help prevent bone loss.  Group therapy is sometimes helpful.  Acupuncture may help in some cases. SEEK MEDICAL CARE IF:   You have questions about any symptoms you are having.  You need a referral to a specialist (gynecologist, psychiatrist, or psychologist). SEEK IMMEDIATE MEDICAL CARE IF:   You have vaginal bleeding.  Your period lasts longer than 8 days.  Your periods are recurring sooner than 21  days.  You have bleeding after intercourse.  You have severe depression.  You have pain when you urinate.  You have severe headaches.  You have vision problems. Document Released: 01/02/2005 Document Revised: 09/15/2013 Document Reviewed: 06/24/2013 [facility] Patient Information 2015 [place], [place]. This information is not intended to replace advice given to you by your health care provider. Make sure you discuss any questions you have with your health care provider.

## 2015-05-02 NOTE — Assessment & Plan Note (Addendum)
A: white discharge with mild odor. Suspect BV P: Treat with flagyl followed by diflucan Advised patient to avoid semen in vagina   BV and trich on wet prep, Rx already sent in

## 2015-05-02 NOTE — Progress Notes (Signed)
Pt is here for a Pap Smear.

## 2015-05-02 NOTE — Assessment & Plan Note (Signed)
A; hot flashes and skipped periods P: information given

## 2015-05-03 DIAGNOSIS — A599 Trichomoniasis, unspecified: Secondary | ICD-10-CM | POA: Insufficient documentation

## 2015-05-03 LAB — CYTOLOGY - PAP

## 2015-05-03 LAB — CERVICOVAGINAL ANCILLARY ONLY
Chlamydia: NEGATIVE
Neisseria Gonorrhea: NEGATIVE
Wet Prep (BD Affirm): POSITIVE — AB

## 2015-05-05 ENCOUNTER — Telehealth: Payer: Self-pay | Admitting: *Deleted

## 2015-05-05 NOTE — Telephone Encounter (Signed)
-----   Message from Lorayne Marek, MD sent at 05/04/2015  1:14 PM EDT ----- Call and let the patient know that her Pap smear is normal, but the test for Trichomonas vaginalis is positive, advise patient to take Flagyl 500 mg twice a day for 7 days, advise patient that her sexual partner should be tested/treated.

## 2015-05-05 NOTE — Telephone Encounter (Signed)
-----   Message from Boykin Nearing, MD sent at 05/03/2015  8:50 AM EDT ----- Negative Gc/chlam

## 2015-05-05 NOTE — Telephone Encounter (Signed)
Pt aware of results  Stated will pick up Rx at [place]

## 2015-05-05 NOTE — Telephone Encounter (Signed)
-----   Message from Boykin Nearing, MD sent at 05/03/2015  2:39 PM EDT ----- BV and trich on wet prep Flagyl, diflucan Have partner treated for BV Sex with condoms

## 2015-05-16 ENCOUNTER — Telehealth: Payer: Self-pay | Admitting: Internal Medicine

## 2015-05-16 NOTE — Telephone Encounter (Signed)
Patient called requesting medication refill on metroNIDAZOLE (FLAGYL) 500 MG tablet, patient states she dropped it in toilet . Please f/u with patient

## 2015-05-19 ENCOUNTER — Other Ambulatory Visit: Payer: Self-pay | Admitting: Family Medicine

## 2015-05-19 DIAGNOSIS — N898 Other specified noninflammatory disorders of vagina: Secondary | ICD-10-CM

## 2015-05-19 DIAGNOSIS — A599 Trichomoniasis, unspecified: Secondary | ICD-10-CM

## 2015-05-19 MED ORDER — METRONIDAZOLE 500 MG PO TABS: 500.0000 mg | ORAL_TABLET | Freq: Two times a day (BID) | ORAL | Status: DC

## 2015-05-21 ENCOUNTER — Encounter (HOSPITAL_COMMUNITY): Payer: Self-pay

## 2015-05-21 ENCOUNTER — Emergency Department (HOSPITAL_COMMUNITY)
Admission: EM | Admit: 2015-05-21 | Discharge: 2015-05-21 | Disposition: A | Discharge status: Home / Self Care | Payer: Medicaid Other | Attending: Emergency Medicine | Admitting: Emergency Medicine

## 2015-05-21 DIAGNOSIS — M545 Low back pain, unspecified: Secondary | ICD-10-CM

## 2015-05-21 DIAGNOSIS — Z792 Long term (current) use of antibiotics: Secondary | ICD-10-CM | POA: Insufficient documentation

## 2015-05-21 DIAGNOSIS — J45909 Unspecified asthma, uncomplicated: Secondary | ICD-10-CM | POA: Diagnosis not present

## 2015-05-21 DIAGNOSIS — Z720 Tobacco use: Secondary | ICD-10-CM | POA: Diagnosis not present

## 2015-05-21 DIAGNOSIS — Z8659 Personal history of other mental and behavioral disorders: Secondary | ICD-10-CM | POA: Diagnosis not present

## 2015-05-21 MED ORDER — OXYCODONE-ACETAMINOPHEN 5-325 MG PO TABS
1.0000 | ORAL_TABLET | Freq: Once | ORAL | Status: AC
  Administered 2015-05-21: 1 via ORAL
  Filled 2015-05-21: qty 1

## 2015-05-21 MED ORDER — CYCLOBENZAPRINE HCL 10 MG PO TABS: 5.0000 mg | ORAL_TABLET | Freq: Two times a day (BID) | ORAL | Status: DC | PRN

## 2015-05-21 MED ORDER — KETOROLAC TROMETHAMINE 60 MG/2ML IM SOLN
60.0000 mg | Freq: Once | INTRAMUSCULAR | Status: AC
  Administered 2015-05-21: 60 mg via INTRAMUSCULAR
  Filled 2015-05-21: qty 2

## 2015-05-21 MED ORDER — ONDANSETRON 4 MG PO TBDP
4.0000 mg | ORAL_TABLET | Freq: Once | ORAL | Status: AC
  Administered 2015-05-21: 4 mg via ORAL
  Filled 2015-05-21: qty 1

## 2015-05-21 MED ORDER — HYDROCODONE-ACETAMINOPHEN 5-325 MG PO TABS: 1.0000 | ORAL_TABLET | ORAL | Status: DC | PRN

## 2015-05-21 MED ORDER — PREDNISONE 10 MG PO TABS: 20.0000 mg | ORAL_TABLET | Freq: Every day | ORAL | Status: DC

## 2015-05-21 NOTE — ED Notes (Signed)
Pt reports yesterday morning she got up and did her normal cleaning routine and around 11am started having lower back pain.  Laid on floor from 11a-4pm.  Pt tried muscle relaxer, icy hot, hot showers with no relief.

## 2015-05-21 NOTE — ED Notes (Signed)
Declined W/C at D/C and was escorted to lobby by RN. 

## 2015-05-21 NOTE — Discharge Instructions (Signed)
Back Pain, Adult Low back pain is very common. About 1 in 5 people have back pain.The cause of low back pain is rarely dangerous. The pain often gets better over time.About half of people with a sudden onset of back pain feel better in just 2 weeks. About 8 in 10 people feel better by 6 weeks.  CAUSES Some common causes of back pain include:  Strain of the muscles or ligaments supporting the spine.  Wear and tear (degeneration) of the spinal discs.  Arthritis.  Direct injury to the back. DIAGNOSIS Most of the time, the direct cause of low back pain is not known.However, back pain can be treated effectively even when the exact cause of the pain is unknown.Answering your caregiver's questions about your overall health and symptoms is one of the most accurate ways to make sure the cause of your pain is not dangerous. If your caregiver needs more information, he or she may order lab work or imaging tests (X-rays or MRIs).However, even if imaging tests show changes in your back, this usually does not require surgery. HOME CARE INSTRUCTIONS For many people, back pain returns.Since low back pain is rarely dangerous, it is often a condition that people can learn to manageon their own.   Remain active. It is stressful on the back to sit or stand in one place. Do not sit, drive, or stand in one place for more than 30 minutes at a time. Take short walks on level surfaces as soon as pain allows.Try to increase the length of time you walk each day.  Do not stay in bed.Resting more than 1 or 2 days can delay your recovery.  Do not avoid exercise or work.Your body is made to move.It is not dangerous to be active, even though your back may hurt.Your back will likely heal faster if you return to being active before your pain is gone.  Pay attention to your body when you bend and lift. Many people have less discomfortwhen lifting if they bend their knees, keep the load close to their bodies,and  avoid twisting. Often, the most comfortable positions are those that put less stress on your recovering back.  Find a comfortable position to sleep. Use a firm mattress and lie on your side with your knees slightly bent. If you lie on your back, put a pillow under your knees.  Only take over-the-counter or prescription medicines as directed by your caregiver. Over-the-counter medicines to reduce pain and inflammation are often the most helpful.Your caregiver may prescribe muscle relaxant drugs.These medicines help dull your pain so you can more quickly return to your normal activities and healthy exercise.  Put ice on the injured area.  Put ice in a plastic bag.  Place a towel between your skin and the bag.  Leave the ice on for 15-20 minutes, 03-04 times a day for the first 2 to 3 days. After that, ice and heat may be alternated to reduce pain and spasms.  Ask your caregiver about trying back exercises and gentle massage. This may be of some benefit.  Avoid feeling anxious or stressed.Stress increases muscle tension and can worsen back pain.It is important to recognize when you are anxious or stressed and learn ways to manage it.Exercise is a great option. SEEK MEDICAL CARE IF:  You have pain that is not relieved with rest or medicine.  You have pain that does not improve in 1 week.  You have new symptoms.  You are generally not feeling well. SEEK   IMMEDIATE MEDICAL CARE IF:   You have pain that radiates from your back into your legs.  You develop new bowel or bladder control problems.  You have unusual weakness or numbness in your arms or legs.  You develop nausea or vomiting.  You develop abdominal pain.  You feel faint. Document Released: 11/25/2005 Document Revised: 05/26/2012 Document Reviewed: 03/29/2014 Exit[employer] Patient Information 2015 [employer]. This information is not intended to replace advice given to you by your health care provider. Make sure you  discuss any questions you have with your health care provider.  

## 2015-05-21 NOTE — ED Provider Notes (Signed)
CSN: [id number]     Arrival date & time 05/21/15  1215 History  This chart was scribed for Delos Haring, PA-C, working with Pamella Pert, MD by Steva Colder, ED Scribe. The patient was seen in room TR09C/TR09C at 3:07 PM.    Chief Complaint  Patient presents with  . Back Pain      The history is provided by the patient. No language interpreter was used.    HPI Comments: Tina Mayo is a 44 y.o. female who presents to the Emergency Department complaining of across the lower back pain onset yesterday. She was doing her normal cleaning routine and began to have low back pain around 11 AM. She reports that she has never had back pain issues in the past. She tried to lay down and she didn't have any relief with laying down. She notes that this morning she was unable to get out of the bed because of the pain. She reports that the back pain radiates to her hip and buttocks. She states that she has tried 4 aleve yesterday and 4 aleve today, muscle relaxers, icy-hot, asper cream, and hot showers with no relief for her symptoms. Pt is unsure of what muscle relaxer she was given. She denies bowel/bladder incontinence, dysuria, and any other symptoms.    Past Medical History  Diagnosis Date  . Depression   . Anxiety   . Asthma    Past Surgical History  Procedure Laterality Date  . Right ankle surgery      2010  . Tubal ligation    . Benign breast tumor removed      2006   Family History  Problem Relation Age of Onset  . Breast cancer Mother   . Cancer Mother   . Diabetes Mellitus II Father   . Diabetes Father    History  Substance Use Topics  . Smoking status: Current Every Day Smoker -- 0.50 packs/day    Types: Cigarettes  . Smokeless tobacco: Current User  . Alcohol Use: No   OB History    No data available     Review of Systems  Gastrointestinal:       No bowel incontinence  Genitourinary: Negative for dysuria.       No bladder incontinence  Musculoskeletal:  Positive for back pain.      Allergies  Levaquin  Home Medications   Prior to Admission medications   Medication Sig Start Date End Date Taking? Authorizing Provider  albuterol (PROVENTIL HFA;VENTOLIN HFA) 108 (90 BASE) MCG/ACT inhaler Inhale 2 puffs into the lungs every 4 (four) hours as needed for wheezing or shortness of breath. 10/12/13   Shanker Kristeen Mans, MD  cyclobenzaprine (FLEXERIL) 10 MG tablet Take 0.5-1 tablets (5-10 mg total) by mouth 2 (two) times daily as needed for muscle spasms. 05/21/15   Jilliana Burkes Carlota Raspberry, PA-C  fluconazole (DIFLUCAN) 150 MG tablet Take 1 tablet (150 mg total) by mouth once. 05/02/15   Boykin Nearing, MD  HYDROcodone-acetaminophen (NORCO/VICODIN) 5-325 MG per tablet Take 1-2 tablets by mouth every 4 (four) hours as needed. 05/21/15   Jaclyn Andy Carlota Raspberry, PA-C  hydroxypropyl methylcellulose / hypromellose (ISOPTO TEARS / GONIOVISC) 2.5 % ophthalmic solution Place 1 drop into the right eye as needed for dry eyes. Patient not taking: Reported on 02/28/2015 01/30/15   Domenic Moras, PA-C  metroNIDAZOLE (FLAGYL) 500 MG tablet Take 1 tablet (500 mg total) by mouth 2 (two) times daily. 05/19/15   Josalyn Funches, MD  polyvinyl alcohol (ARTIFICIAL TEARS) 1.4 %  ophthalmic solution Place 1 drop into both eyes as needed for dry eyes. Patient not taking: Reported on 05/02/2015 02/28/15   Lorayne Marek, MD  predniSONE (DELTASONE) 10 MG tablet Take 2 tablets (20 mg total) by mouth daily. 05/21/15   Deaira Leckey Carlota Raspberry, PA-C   BP 103/67 mmHg  Pulse 104  Temp(Src) 97.9 F (36.6 C) (Oral)  Resp 18  Ht 5\' 7"  (1.702 m)  Wt 188 lb 8 oz (85.503 kg)  BMI 29.52 kg/m2  SpO2 98%  LMP 02/18/2015 Physical Exam  Constitutional: She is oriented to person, place, and time. She appears well-developed and well-nourished. No distress.  HENT:  Head: Normocephalic and atraumatic.  Eyes: EOM are normal. Pupils are equal, round, and reactive to light.  Neck: Normal range of motion. Neck supple. No  tracheal deviation present.  Cardiovascular: Normal rate and regular rhythm.   Pulmonary/Chest: Effort normal. No respiratory distress.  Abdominal: Soft.  Musculoskeletal: Normal range of motion.  Pt has equal strength to bilateral lower extremities.  Neurosensory function adequate to both legs Skin color is normal. Skin is warm and moist.  I see no step off deformity, no midline bony tenderness.  Pt is able to ambulate.  No crepitus, laceration, effusion, induration, lesions, swelling.   Pedal pulses are symmetrical and palpable bilaterally  Tenderness to palpation of paraspinel muscles bilaterally of the L4-5 region.   Neurological: She is alert and oriented to person, place, and time.  Skin: Skin is warm and dry.  Psychiatric: She has a normal mood and affect. Her behavior is normal.  Nursing note and vitals reviewed.   ED Course  Procedures (including critical care time) DIAGNOSTIC STUDIES: Oxygen Saturation is 98% on RA, nl by my interpretation.    COORDINATION OF CARE: 3:19 PM-Discussed treatment plan which includes pain medication Rx, muscle relaxer Rx, steroid Rx, with pt at bedside and pt agreed to plan.   Labs Review Labs Reviewed - No data to display  Imaging Review No results found.   EKG Interpretation None      MDM   Final diagnoses:  Bilateral low back pain without sciatica    44 y.o.Tina Mayo's  with back pain. No neurological deficits and normal neuro exam. Patient can walk. No loss of bowel or bladder control. No concern for cauda equina at this time base on HPI and physical exam findings. No fever, night sweats, weight loss, h/o cancer, IVDU.  Medications  ketorolac (TORADOL) injection 60 mg (not administered)  oxyCODONE-acetaminophen (PERCOCET/ROXICET) 5-325 MG per tablet 1 tablet (not administered)  ondansetron (ZOFRAN-ODT) disintegrating tablet 4 mg (not administered)   Patient plans to follow-up with PCP tomorrow at [facility]. Patient Plan 1. Medications: NSAIDs and muscle relaxer. Cont usual home medications unless otherwise directed. 2. Treatment: rest, drink plenty of fluids, gentle stretching as discussed, alternate ice and heat  3. Follow Up: Please followup with your primary doctor for discussion of your diagnoses and further evaluation after today's visit; if you do not have a primary care doctor use the resource guide provided to find one  Advised to follow-up with the orthopedist if symptoms do not start to resolve in the next 2-3 days. If develop loss of bowel or urinary control return to the ED as soon as possible for further evaluation. To take the medications as prescribed as they can cause harm if not taken appropriately.   Vital signs are stable at discharge. Filed Vitals:   05/21/15 1239  BP: 103/67  Pulse: 104  Temp: 97.9 F (36.6 C)  Resp: 18    Patient/guardian has voiced understanding and agreed to follow-up with the PCP or specialist.    I personally performed the services described in this documentation, which was scribed in my presence. The recorded information has been reviewed and is accurate.    Delos Haring, PA-C 05/21/15 [place], MD 05/22/15 1229

## 2015-05-26 ENCOUNTER — Ambulatory Visit: Payer: Medicaid Other | Admitting: Internal Medicine

## 2015-06-13 ENCOUNTER — Ambulatory Visit: Payer: Medicaid Other | Admitting: Internal Medicine

## 2015-06-15 ENCOUNTER — Encounter: Payer: Self-pay | Admitting: Internal Medicine

## 2015-06-15 ENCOUNTER — Ambulatory Visit: Payer: Medicaid Other | Attending: Internal Medicine | Admitting: Internal Medicine

## 2015-06-15 VITALS — BP 104/71 | HR 89 | Temp 98.0°F | Resp 16 | Wt 188.0 lb

## 2015-06-15 DIAGNOSIS — M545 Low back pain, unspecified: Secondary | ICD-10-CM

## 2015-06-15 DIAGNOSIS — G8929 Other chronic pain: Secondary | ICD-10-CM | POA: Diagnosis not present

## 2015-06-15 DIAGNOSIS — Z7952 Long term (current) use of systemic steroids: Secondary | ICD-10-CM | POA: Insufficient documentation

## 2015-06-15 DIAGNOSIS — M62830 Muscle spasm of back: Secondary | ICD-10-CM | POA: Insufficient documentation

## 2015-06-15 DIAGNOSIS — F17210 Nicotine dependence, cigarettes, uncomplicated: Secondary | ICD-10-CM | POA: Diagnosis not present

## 2015-06-15 DIAGNOSIS — Z79899 Other long term (current) drug therapy: Secondary | ICD-10-CM | POA: Insufficient documentation

## 2015-06-15 MED ORDER — BACLOFEN 10 MG PO TABS: 10.0000 mg | ORAL_TABLET | Freq: Every evening | ORAL | Status: DC | PRN

## 2015-06-15 NOTE — Progress Notes (Signed)
MRN: [id number] Name: Tina Mayo  Sex: female Age: 44 y.o. DOB: 21-Nov-1971  Allergies: Levaquin  Chief Complaint  Patient presents with  . Back Pain    HPI: Patient is 44 y.o. female who last month patient went to the emergency room with symptoms of lower back pain, EMR reviewed  patient was prescribed, pain medication Norco, prednisone as well as muscle relaxant Flexeril. As per patient the Flexeril does not help her with the symptoms she finished the prednisone also the narcotic medication made her drowsy. She reports some improvement but is requesting some different muscle relaxant. Patient denies any urinary or stool incontinence denies any numbness weakness.  Past Medical History  Diagnosis Date  . Depression   . Anxiety   . Asthma     Past Surgical History  Procedure Laterality Date  . Right ankle surgery      2010  . Tubal ligation    . Benign breast tumor removed      2006      Medication List       This list is accurate as of: 06/15/15  3:03 PM.  Always use your most recent med list.               albuterol 108 (90 BASE) MCG/ACT inhaler  Commonly known as:  PROVENTIL HFA;VENTOLIN HFA  Inhale 2 puffs into the lungs every 4 (four) hours as needed for wheezing or shortness of breath.     baclofen 10 MG tablet  Commonly known as:  LIORESAL  Take 1 tablet (10 mg total) by mouth at bedtime as needed for muscle spasms.     fluconazole 150 MG tablet  Commonly known as:  DIFLUCAN  Take 1 tablet (150 mg total) by mouth once.     HYDROcodone-acetaminophen 5-325 MG per tablet  Commonly known as:  NORCO/VICODIN  Take 1-2 tablets by mouth every 4 (four) hours as needed.     hydroxypropyl methylcellulose / hypromellose 2.5 % ophthalmic solution  Commonly known as:  ISOPTO TEARS / GONIOVISC  Place 1 drop into the right eye as needed for dry eyes.     metroNIDAZOLE 500 MG tablet  Commonly known as:  FLAGYL  Take 1 tablet (500 mg total) by mouth 2 (two)  times daily.     polyvinyl alcohol 1.4 % ophthalmic solution  Commonly known as:  ARTIFICIAL TEARS  Place 1 drop into both eyes as needed for dry eyes.     predniSONE 10 MG tablet  Commonly known as:  DELTASONE  Take 2 tablets (20 mg total) by mouth daily.        Meds ordered this encounter  Medications  . baclofen (LIORESAL) 10 MG tablet    Sig: Take 1 tablet (10 mg total) by mouth at bedtime as needed for muscle spasms.    Dispense:  30 each    Refill:  1     There is no immunization history on file for this patient.  Family History  Problem Relation Age of Onset  . Breast cancer Mother   . Cancer Mother   . Diabetes Mellitus II Father   . Diabetes Father     History  Substance Use Topics  . Smoking status: Current Every Day Smoker -- 0.50 packs/day    Types: Cigarettes  . Smokeless tobacco: Current User  . Alcohol Use: No    Review of Systems   As noted in HPI  Filed Vitals:   06/15/15 1405  BP:  104/71  Pulse: 89  Temp: 98 F (36.7 C)  Resp: 16    Physical Exam  Physical Exam  Constitutional: No distress.  Eyes: EOM are normal. Pupils are equal, round, and reactive to light.  Cardiovascular: Normal rate and regular rhythm.   Pulmonary/Chest: Breath sounds normal. No respiratory distress. She has no wheezes. She has no rales.  Musculoskeletal:  Minimal lower lumbar paraspinal tenderness, SLR negative, equal strength both lower extremity is.    CBC    Component Value Date/Time   WBC 8.7 04/25/2015 1108   WBC 8.3 12/24/2013 1421   RBC 4.23 04/25/2015 1108   RBC 3.88 12/24/2013 1421   RBC 3.54* 08/26/2013 0735   HGB 12.4 04/25/2015 1108   HGB 11.1* 12/24/2013 1421   HCT 36.2 04/25/2015 1108   HCT 32.5* 12/24/2013 1421   PLT 84* 04/25/2015 1108   PLT 106* 12/24/2013 1421   MCV 85.6 04/25/2015 1108   MCV 83.8 12/24/2013 1421   LYMPHSABS 2.3 04/25/2015 1108   LYMPHSABS 2.1 12/24/2013 1421   MONOABS 0.5 04/25/2015 1108   MONOABS 0.4  12/24/2013 1421   EOSABS 0.3 04/25/2015 1108   EOSABS 0.2 12/24/2013 1421   BASOSABS 0.0 04/25/2015 1108   BASOSABS 0.1 12/24/2013 1421    CMP     Component Value Date/Time   NA 138 04/25/2015 1108   NA 140 12/24/2013 1421   K 5.1 04/25/2015 1108   K 3.8 12/24/2013 1421   CL 107 04/25/2015 1108   CO2 22 04/25/2015 1108   CO2 25 12/24/2013 1421   GLUCOSE 91 04/25/2015 1108   GLUCOSE 88 12/24/2013 1421   BUN 11 04/25/2015 1108   BUN 5.8* 12/24/2013 1421   CREATININE 0.78 04/25/2015 1108   CREATININE 0.75 09/06/2014 1146   CREATININE 0.8 12/24/2013 1421   CALCIUM 8.9 04/25/2015 1108   CALCIUM 8.8 12/24/2013 1421   PROT 7.5 04/25/2015 1108   PROT 7.2 12/24/2013 1421   ALBUMIN 4.2 04/25/2015 1108   ALBUMIN 3.8 12/24/2013 1421   AST 15 04/25/2015 1108   AST 14 12/24/2013 1421   ALT 13 04/25/2015 1108   ALT 10 12/24/2013 1421   ALKPHOS 70 04/25/2015 1108   ALKPHOS 78 12/24/2013 1421   BILITOT 1.1 04/25/2015 1108   BILITOT 0.75 12/24/2013 1421   GFRNONAA >89 04/25/2015 1108   GFRNONAA >90 09/06/2014 1146   GFRAA >89 04/25/2015 1108   GFRAA >90 09/06/2014 1146    No results found for: CHOL  No results found for: HGBA1C  Lab Results  Component Value Date/Time   AST 15 04/25/2015 11:08 AM   AST 14 12/24/2013 02:21 PM    Assessment and Plan  Chronic lower back pain / Back muscle spasm -- Plan: baclofen (LIORESAL) 10 MG tablet Have advised patient to apply heating pad, Tylenol/ibuprofen when necessary have prescribed  baclofen.  Return in about 3 months (around 09/15/2015).   This note has been created with Surveyor, quantity. Any transcriptional errors are unintentional.    Lorayne Marek, MD

## 2015-06-15 NOTE — Progress Notes (Signed)
Patient here for follow up on her chronic back pain Patient states the back brace is not really helping and its Been way to hot to wear it

## 2015-06-28 ENCOUNTER — Encounter: Payer: Self-pay | Admitting: Internal Medicine

## 2015-06-28 ENCOUNTER — Ambulatory Visit (HOSPITAL_BASED_OUTPATIENT_CLINIC_OR_DEPARTMENT_OTHER): Payer: Medicaid Other | Admitting: Internal Medicine

## 2015-06-28 ENCOUNTER — Telehealth: Payer: Self-pay

## 2015-06-28 ENCOUNTER — Ambulatory Visit (HOSPITAL_COMMUNITY)
Admission: RE | Admit: 2015-06-28 | Discharge: 2015-06-28 | Disposition: A | Discharge status: Home / Self Care | Payer: Medicaid Other | Source: Ambulatory Visit | Attending: Internal Medicine | Admitting: Internal Medicine

## 2015-06-28 VITALS — BP 110/73 | HR 90 | Temp 98.0°F | Resp 16 | Wt 190.8 lb

## 2015-06-28 DIAGNOSIS — K8020 Calculus of gallbladder without cholecystitis without obstruction: Secondary | ICD-10-CM

## 2015-06-28 DIAGNOSIS — Q7649 Other congenital malformations of spine, not associated with scoliosis: Secondary | ICD-10-CM | POA: Diagnosis not present

## 2015-06-28 DIAGNOSIS — M545 Low back pain: Secondary | ICD-10-CM

## 2015-06-28 DIAGNOSIS — G8929 Other chronic pain: Secondary | ICD-10-CM

## 2015-06-28 DIAGNOSIS — M47816 Spondylosis without myelopathy or radiculopathy, lumbar region: Secondary | ICD-10-CM

## 2015-06-28 MED ORDER — GABAPENTIN 300 MG PO CAPS: 300.0000 mg | ORAL_CAPSULE | Freq: Three times a day (TID) | ORAL | Status: DC

## 2015-06-28 NOTE — Telephone Encounter (Signed)
-----   Message from Lorayne Marek, MD sent at 06/28/2015 12:04 PM EDT ----- Call and let the patient know that her x-ray shows degenerative changes at the lower lumbar spine area, if patient has worsening symptoms she can be referred to  Physical therapy/pain management.. Also x-ray reports patient has gallstones. If patient becomes symptomatic then she can be referred to general surgery.

## 2015-06-28 NOTE — Progress Notes (Signed)
MRN: [id number] Name: Tina Mayo  Sex: female Age: 44 y.o. DOB: [birth year]  Allergies: Levaquin  Chief Complaint  Patient presents with  . Back Pain    HPI: Patient is 44 y.o. female who comes again complaining of chronic lower back pain, has been prescribed muscle relaxant as per patient does not help her, she has been taking 800 mg of ibuprofen, Aleve without much improvement, as per patient she cannot take tramadol because it caused her rash/shaking, patient denies any numbness weakness or  Incontinence,patient denies any fever chills any recent fall or trauma.  Past Medical History  Diagnosis Date  . Depression   . Anxiety   . Asthma     Past Surgical History  Procedure Laterality Date  . Right ankle surgery      2010  . Tubal ligation    . Benign breast tumor removed      2006      Medication List       This list is accurate as of: 06/28/15 11:29 AM.  Always use your most recent med list.               albuterol 108 (90 BASE) MCG/ACT inhaler  Commonly known as:  PROVENTIL HFA;VENTOLIN HFA  Inhale 2 puffs into the lungs every 4 (four) hours as needed for wheezing or shortness of breath.     baclofen 10 MG tablet  Commonly known as:  LIORESAL  Take 1 tablet (10 mg total) by mouth at bedtime as needed for muscle spasms.     fluconazole 150 MG tablet  Commonly known as:  DIFLUCAN  Take 1 tablet (150 mg total) by mouth once.     gabapentin 300 MG capsule  Commonly known as:  NEURONTIN  Take 1 capsule (300 mg total) by mouth 3 (three) times daily.     HYDROcodone-acetaminophen 5-325 MG per tablet  Commonly known as:  NORCO/VICODIN  Take 1-2 tablets by mouth every 4 (four) hours as needed.     hydroxypropyl methylcellulose / hypromellose 2.5 % ophthalmic solution  Commonly known as:  ISOPTO TEARS / GONIOVISC  Place 1 drop into the right eye as needed for dry eyes.     metroNIDAZOLE 500 MG tablet  Commonly known as:  FLAGYL  Take 1 tablet (500 mg  total) by mouth 2 (two) times daily.     polyvinyl alcohol 1.4 % ophthalmic solution  Commonly known as:  ARTIFICIAL TEARS  Place 1 drop into both eyes as needed for dry eyes.     predniSONE 10 MG tablet  Commonly known as:  DELTASONE  Take 2 tablets (20 mg total) by mouth daily.        Meds ordered this encounter  Medications  . gabapentin (NEURONTIN) 300 MG capsule    Sig: Take 1 capsule (300 mg total) by mouth 3 (three) times daily.    Dispense:  90 capsule    Refill:  3     There is no immunization history on file for this patient.  Family History  Problem Relation Age of Onset  . Breast cancer Mother   . Cancer Mother   . Diabetes Mellitus II Father   . Diabetes Father     History  Substance Use Topics  . Smoking status: Current Every Day Smoker -- 0.50 packs/day    Types: Cigarettes  . Smokeless tobacco: Current User  . Alcohol Use: No    Review of Systems   As noted in HPI  Filed Vitals:   06/28/15 1007  BP: 110/73  Pulse: 90  Temp: 98 F (36.7 C)  Resp: 16    Physical Exam  Physical Exam  Constitutional: No distress.  Eyes: EOM are normal. Pupils are equal, round, and reactive to light.  Cardiovascular: Normal rate and regular rhythm.   Pulmonary/Chest: Breath sounds normal. No respiratory distress. She has no wheezes. She has no rales.  Musculoskeletal:   lower lumbar paraspinal tenderness, SLR negative, equal strength both lower extremities    CBC    Component Value Date/Time   WBC 8.7 04/25/2015 1108   WBC 8.3 12/24/2013 1421   RBC 4.23 04/25/2015 1108   RBC 3.88 12/24/2013 1421   RBC 3.54* 08/26/2013 0735   HGB 12.4 04/25/2015 1108   HGB 11.1* 12/24/2013 1421   HCT 36.2 04/25/2015 1108   HCT 32.5* 12/24/2013 1421   PLT 84* 04/25/2015 1108   PLT 106* 12/24/2013 1421   MCV 85.6 04/25/2015 1108   MCV 83.8 12/24/2013 1421   LYMPHSABS 2.3 04/25/2015 1108   LYMPHSABS 2.1 12/24/2013 1421   MONOABS 0.5 04/25/2015 1108   MONOABS  0.4 12/24/2013 1421   EOSABS 0.3 04/25/2015 1108   EOSABS 0.2 12/24/2013 1421   BASOSABS 0.0 04/25/2015 1108   BASOSABS 0.1 12/24/2013 1421    CMP     Component Value Date/Time   NA 138 04/25/2015 1108   NA 140 12/24/2013 1421   K 5.1 04/25/2015 1108   K 3.8 12/24/2013 1421   CL 107 04/25/2015 1108   CO2 22 04/25/2015 1108   CO2 25 12/24/2013 1421   GLUCOSE 91 04/25/2015 1108   GLUCOSE 88 12/24/2013 1421   BUN 11 04/25/2015 1108   BUN 5.8* 12/24/2013 1421   CREATININE 0.78 04/25/2015 1108   CREATININE 0.75 09/06/2014 1146   CREATININE 0.8 12/24/2013 1421   CALCIUM 8.9 04/25/2015 1108   CALCIUM 8.8 12/24/2013 1421   PROT 7.5 04/25/2015 1108   PROT 7.2 12/24/2013 1421   ALBUMIN 4.2 04/25/2015 1108   ALBUMIN 3.8 12/24/2013 1421   AST 15 04/25/2015 1108   AST 14 12/24/2013 1421   ALT 13 04/25/2015 1108   ALT 10 12/24/2013 1421   ALKPHOS 70 04/25/2015 1108   ALKPHOS 78 12/24/2013 1421   BILITOT 1.1 04/25/2015 1108   BILITOT 0.75 12/24/2013 1421   GFRNONAA >89 04/25/2015 1108   GFRNONAA >90 09/06/2014 1146   GFRAA >89 04/25/2015 1108   GFRAA >90 09/06/2014 1146    No results found for: CHOL  No results found for: HGBA1C  Lab Results  Component Value Date/Time   AST 15 04/25/2015 11:08 AM   AST 14 12/24/2013 02:21 PM    Assessment and Plan  Chronic lower back pain - Plan:I have ordered  DG Lumbar Spine Complete, prescribed gabapentin (NEURONTIN) 300 MG capsule  Return in about 3 months (around 09/28/2015), or if symptoms worsen or fail to improve.   This note has been created with Surveyor, quantity. Any transcriptional errors are unintentional.    Lorayne Marek, MD

## 2015-06-28 NOTE — Progress Notes (Signed)
Patient complains of lower back pain Pain is not getting any better but progressively worse Patient is currently using icy hot patches with little relief

## 2015-06-28 NOTE — Telephone Encounter (Signed)
Patient is aware of her x ray results Referrals placed in epic for physical therapy and general surgery

## 2015-07-11 ENCOUNTER — Ambulatory Visit: Payer: Self-pay | Admitting: General Surgery

## 2015-07-11 ENCOUNTER — Ambulatory Visit: Payer: Medicaid Other | Attending: Internal Medicine | Admitting: Physical Therapy

## 2015-08-09 ENCOUNTER — Emergency Department (HOSPITAL_COMMUNITY)
Admission: EM | Admit: 2015-08-09 | Discharge: 2015-08-09 | Disposition: A | Discharge status: Home / Self Care | Payer: Medicaid Other | Attending: Emergency Medicine | Admitting: Emergency Medicine

## 2015-08-09 ENCOUNTER — Encounter (HOSPITAL_COMMUNITY): Payer: Self-pay

## 2015-08-09 DIAGNOSIS — J45909 Unspecified asthma, uncomplicated: Secondary | ICD-10-CM | POA: Diagnosis not present

## 2015-08-09 DIAGNOSIS — F329 Major depressive disorder, single episode, unspecified: Secondary | ICD-10-CM | POA: Diagnosis not present

## 2015-08-09 DIAGNOSIS — F419 Anxiety disorder, unspecified: Secondary | ICD-10-CM | POA: Diagnosis not present

## 2015-08-09 DIAGNOSIS — H109 Unspecified conjunctivitis: Secondary | ICD-10-CM | POA: Diagnosis not present

## 2015-08-09 DIAGNOSIS — Z720 Tobacco use: Secondary | ICD-10-CM | POA: Diagnosis not present

## 2015-08-09 DIAGNOSIS — Z79899 Other long term (current) drug therapy: Secondary | ICD-10-CM | POA: Diagnosis not present

## 2015-08-09 DIAGNOSIS — Z7952 Long term (current) use of systemic steroids: Secondary | ICD-10-CM | POA: Insufficient documentation

## 2015-08-09 DIAGNOSIS — R600 Localized edema: Secondary | ICD-10-CM | POA: Diagnosis present

## 2015-08-09 MED ORDER — SULFAMETHOXAZOLE-TRIMETHOPRIM 800-160 MG PO TABS: 1.0000 | ORAL_TABLET | Freq: Two times a day (BID) | ORAL | Status: AC

## 2015-08-09 MED ORDER — TOBRAMYCIN 0.3 % OP SOLN: 2.0000 [drp] | OPHTHALMIC | Status: DC

## 2015-08-09 NOTE — ED Notes (Signed)
Pt reports difficulty looking to the right. Pt reports this is baseline due to history of bells palsey

## 2015-08-09 NOTE — Discharge Instructions (Signed)
Conjunctivitis Conjunctivitis is commonly called "pink eye." Conjunctivitis can be caused by bacterial or viral infection, allergies, or injuries. There is usually redness of the lining of the eye, itching, discomfort, and sometimes discharge. There may be deposits of matter along the eyelids. A viral infection usually causes a watery discharge, while a bacterial infection causes a yellowish, thick discharge. Pink eye is very contagious and spreads by direct contact. You may be given antibiotic eyedrops as part of your treatment. Before using your eye medicine, remove all drainage from the eye by washing gently with warm water and cotton balls. Continue to use the medication until you have awakened 2 mornings in a row without discharge from the eye. Do not rub your eye. This increases the irritation and helps spread infection. Use separate towels from other household members. Wash your hands with soap and water before and after touching your eyes. Use cold compresses to reduce pain and sunglasses to relieve irritation from light. Do not wear contact lenses or wear eye makeup until the infection is gone. SEEK MEDICAL CARE IF:   Your symptoms are not better after 3 days of treatment.  You have increased pain or trouble seeing.  The outer eyelids become very red or swollen. Document Released: 01/02/2005 Document Revised: 02/17/2012 Document Reviewed: 11/25/2005 [facility] Patient Information 2015 [place], [place]. This information is not intended to replace advice given to you by your health care provider. Make sure you discuss any questions you have with your health care provider. Bell's Palsy Bell's palsy is a condition in which the muscles on one side of the face cannot move (paralysis). This is because the nerves in the face are paralyzed. It is most often thought to be caused by a virus. The virus causes swelling of the nerve that controls movement on one side of the face. The nerve travels through a  tight space surrounded by bone. When the nerve swells, it can be compressed by the bone. This results in damage to the protective covering around the nerve. This damage interferes with how the nerve communicates with the muscles of the face. As a result, it can cause weakness or paralysis of the facial muscles.  Injury (trauma), tumor, and surgery may cause Bell's palsy, but most of the time the cause is unknown. It is a relatively common condition. It starts suddenly (abrupt onset) with the paralysis usually ending within 2 days. Bell's palsy is not dangerous. But because the eye does not close properly, you may need care to keep the eye from getting dry. This can include splinting (to keep the eye shut) or moistening with artificial tears. Bell's palsy very seldom occurs on both sides of the face at the same time. SYMPTOMS   Eyebrow sagging.  Drooping of the eyelid and corner of the mouth.  Inability to close one eye.  Loss of taste on the front of the tongue.  Sensitivity to loud noises. TREATMENT  The treatment is usually non-surgical. If the patient is seen within the first 24 to 48 hours, a short course of steroids may be prescribed, in an attempt to shorten the length of the condition. Antiviral medicines may also be used with the steroids, but it is unclear if they are helpful.  You will need to protect your eye, if you cannot close it. The cornea (clear covering over your eye) will become dry and can be damaged. Artificial tears can be used to keep your eye moist. Glasses or an eye patch should be worn  to protect your eye. PROGNOSIS  Recovery is variable, ranging from days to months. Although the problem usually goes away completely (about 80% of cases resolve), predicting the outcome is impossible. Most people improve within 3 weeks of when the symptoms began. Improvement may continue for 3 to 6 months. A small number of people have moderate to severe weakness that is permanent.  HOME  CARE INSTRUCTIONS   If your caregiver prescribed medication to reduce swelling in the nerve, use as directed. Do not stop taking the medication unless directed by your caregiver.  Use moisturizing eye drops as needed to prevent drying of your eye, as directed by your caregiver.  Protect your eye, as directed by your caregiver.  Use facial massage and exercises, as directed by your caregiver.  Perform your normal activities, and get your normal rest. SEEK IMMEDIATE MEDICAL CARE IF:   There is pain, redness or irritation in the eye.  You or your child has an oral temperature above 102 F (38.9 C), not controlled by medicine. MAKE SURE YOU:   Understand these instructions.  Will watch your condition.  Will get help right away if you are not doing well or get worse. Document Released: 11/25/2005 Document Revised: 02/17/2012 Document Reviewed: 03/04/2014 [facility] Patient Information 2015 [place], [place]. This information is not intended to replace advice given to you by your health care provider. Make sure you discuss any questions you have with your health care provider.

## 2015-08-09 NOTE — ED Notes (Signed)
Pt got up this morning and the right side of her face was swollen. She states that there are spiders in the house and thinks one bit her. She took two benadryl this morning and the swelling has gone down. Pt denies SOB.

## 2015-08-09 NOTE — ED Provider Notes (Signed)
CSN: [id number]     Arrival date & time 08/09/15  5188 History   First MD Initiated Contact with Patient 08/09/15 [phone]     Chief Complaint  Patient presents with  . Insect Bite     (Consider location/radiation/quality/duration/timing/severity/associated sxs/prior Treatment) Patient is a 44 y.o. female presenting with conjunctivitis. The history is provided by the patient. No language interpreter was used.  Conjunctivitis This is a new problem. The current episode started today. The problem occurs constantly. The problem has been gradually worsening. Associated symptoms include congestion. Nothing aggravates the symptoms. She has tried nothing for the symptoms. The treatment provided moderate relief.  Pt reports she has bell's palsy on the right side.  She has had for 6 months and symptoms have partially resolved. Pt reports she thinks something bit her face.  Pt has noticed spiders.  Pt reports swelling to right side of face, selling to bottom eyelid and some drainage  Past Medical History  Diagnosis Date  . Depression   . Anxiety   . Asthma    Past Surgical History  Procedure Laterality Date  . Right ankle surgery      2010  . Tubal ligation    . Benign breast tumor removed      2006   Family History  Problem Relation Age of Onset  . Breast cancer Mother   . Cancer Mother   . Diabetes Mellitus II Father   . Diabetes Father    Social History  Substance Use Topics  . Smoking status: Current Every Day Smoker -- 0.50 packs/day    Types: Cigarettes  . Smokeless tobacco: Current User  . Alcohol Use: No   OB History    No data available     Review of Systems  HENT: Positive for congestion.   All other systems reviewed and are negative.     Allergies  Levaquin  Home Medications   Prior to Admission medications   Medication Sig Start Date End Date Taking? Authorizing Provider  albuterol (PROVENTIL HFA;VENTOLIN HFA) 108 (90 BASE) MCG/ACT inhaler Inhale 2 puffs into  the lungs every 4 (four) hours as needed for wheezing or shortness of breath. 10/12/13   Shanker Kristeen Mans, MD  baclofen (LIORESAL) 10 MG tablet Take 1 tablet (10 mg total) by mouth at bedtime as needed for muscle spasms. 06/15/15   Lorayne Marek, MD  fluconazole (DIFLUCAN) 150 MG tablet Take 1 tablet (150 mg total) by mouth once. 05/02/15   Josalyn Funches, MD  gabapentin (NEURONTIN) 300 MG capsule Take 1 capsule (300 mg total) by mouth 3 (three) times daily. 06/28/15   Lorayne Marek, MD  HYDROcodone-acetaminophen (NORCO/VICODIN) 5-325 MG per tablet Take 1-2 tablets by mouth every 4 (four) hours as needed. 05/21/15   Tiffany Carlota Raspberry, PA-C  hydroxypropyl methylcellulose / hypromellose (ISOPTO TEARS / GONIOVISC) 2.5 % ophthalmic solution Place 1 drop into the right eye as needed for dry eyes. Patient not taking: Reported on 02/28/2015 01/30/15   Domenic Moras, PA-C  metroNIDAZOLE (FLAGYL) 500 MG tablet Take 1 tablet (500 mg total) by mouth 2 (two) times daily. 05/19/15   Josalyn Funches, MD  polyvinyl alcohol (ARTIFICIAL TEARS) 1.4 % ophthalmic solution Place 1 drop into both eyes as needed for dry eyes. Patient not taking: Reported on 05/02/2015 02/28/15   Lorayne Marek, MD  predniSONE (DELTASONE) 10 MG tablet Take 2 tablets (20 mg total) by mouth daily. 05/21/15   Tiffany Carlota Raspberry, PA-C   BP 116/71 mmHg  Pulse 86  Temp(Src)  98.4 F (36.9 C) (Oral)  Resp 14  Ht 5\' 7"  (1.702 m)  Wt 196 lb (88.905 kg)  BMI 30.69 kg/m2  SpO2 99%  LMP 08/01/2015 (Approximate) Physical Exam  Constitutional: She is oriented to person, place, and time. She appears well-developed and well-nourished.  HENT:  Head: Normocephalic and atraumatic.  Eyes: Pupils are equal, round, and reactive to light.  Injected right conjunctiva. Drainage from right eye,    Neck: Normal range of motion.  Cardiovascular: Normal rate.   Pulmonary/Chest: Effort normal.  Abdominal: Soft.  Musculoskeletal: Normal range of motion.  Neurological: She is  alert and oriented to person, place, and time.  Right facial droop,   Skin: Skin is warm.  Psychiatric: She has a normal mood and affect.  Nursing note and vitals reviewed.   ED Course  Procedures (including critical care time) Labs Review Labs Reviewed - No data to display  Imaging Review No results found. I have personally reviewed and evaluated these images and lab results as part of my medical decision-making.   EKG Interpretation None      MDM   Final diagnoses:  Conjunctivitis of right eye    Bactrim DS tobrex opth avs   Fransico Meadow, PA-C 08/09/15 1007  Evelina Bucy, MD 08/09/15 1020

## 2015-12-03 ENCOUNTER — Emergency Department (HOSPITAL_COMMUNITY): Payer: Medicaid Other

## 2015-12-03 ENCOUNTER — Emergency Department (HOSPITAL_COMMUNITY)
Admission: EM | Admit: 2015-12-03 | Discharge: 2015-12-03 | Disposition: A | Discharge status: Home / Self Care | Payer: Medicaid Other | Attending: Emergency Medicine | Admitting: Emergency Medicine

## 2015-12-03 ENCOUNTER — Encounter (HOSPITAL_COMMUNITY): Payer: Self-pay | Admitting: Vascular Surgery

## 2015-12-03 DIAGNOSIS — Z7952 Long term (current) use of systemic steroids: Secondary | ICD-10-CM | POA: Insufficient documentation

## 2015-12-03 DIAGNOSIS — F329 Major depressive disorder, single episode, unspecified: Secondary | ICD-10-CM | POA: Diagnosis not present

## 2015-12-03 DIAGNOSIS — S99911A Unspecified injury of right ankle, initial encounter: Secondary | ICD-10-CM | POA: Insufficient documentation

## 2015-12-03 DIAGNOSIS — Z79899 Other long term (current) drug therapy: Secondary | ICD-10-CM | POA: Diagnosis not present

## 2015-12-03 DIAGNOSIS — F17210 Nicotine dependence, cigarettes, uncomplicated: Secondary | ICD-10-CM | POA: Insufficient documentation

## 2015-12-03 DIAGNOSIS — J45909 Unspecified asthma, uncomplicated: Secondary | ICD-10-CM | POA: Diagnosis not present

## 2015-12-03 DIAGNOSIS — Y9289 Other specified places as the place of occurrence of the external cause: Secondary | ICD-10-CM | POA: Insufficient documentation

## 2015-12-03 DIAGNOSIS — M25571 Pain in right ankle and joints of right foot: Secondary | ICD-10-CM

## 2015-12-03 DIAGNOSIS — Z792 Long term (current) use of antibiotics: Secondary | ICD-10-CM | POA: Insufficient documentation

## 2015-12-03 DIAGNOSIS — Y998 Other external cause status: Secondary | ICD-10-CM | POA: Insufficient documentation

## 2015-12-03 DIAGNOSIS — W108XXA Fall (on) (from) other stairs and steps, initial encounter: Secondary | ICD-10-CM | POA: Diagnosis not present

## 2015-12-03 DIAGNOSIS — Y9389 Activity, other specified: Secondary | ICD-10-CM | POA: Insufficient documentation

## 2015-12-03 DIAGNOSIS — F419 Anxiety disorder, unspecified: Secondary | ICD-10-CM | POA: Insufficient documentation

## 2015-12-03 MED ORDER — HYDROCODONE-ACETAMINOPHEN 5-325 MG PO TABS: 1.0000 | ORAL_TABLET | Freq: Four times a day (QID) | ORAL | Status: DC | PRN

## 2015-12-03 MED ORDER — KETOROLAC TROMETHAMINE 60 MG/2ML IM SOLN
60.0000 mg | Freq: Once | INTRAMUSCULAR | Status: AC
  Administered 2015-12-03: 60 mg via INTRAMUSCULAR
  Filled 2015-12-03: qty 2

## 2015-12-03 NOTE — Discharge Instructions (Signed)
1. Medications: Vicodin for severe pain, alternate naprosyn and tylenol for pain control, usual home medications 2. Treatment: rest, ice, elevate and use brace, drink plenty of fluids, gentle stretching 3. Follow Up: Please followup with orthopedics as directed or your PCP in 1 week if no improvement for discussion of your diagnoses and further evaluation after today's visit; if you do not have a primary care doctor use the resource guide provided to find one; Please return to the ER for worsening symptoms or other concerns    Ankle Pain Ankle pain is a common symptom. The bones, cartilage, tendons, and muscles of the ankle joint perform a lot of work each day. The ankle joint holds your body weight and allows you to move around. Ankle pain can occur on either side or back of 1 or both ankles. Ankle pain may be sharp and burning or dull and aching. There may be tenderness, stiffness, redness, or warmth around the ankle. The pain occurs more often when a person walks or puts pressure on the ankle. CAUSES  There are many reasons ankle pain can develop. It is important to work with your caregiver to identify the cause since many conditions can impact the bones, cartilage, muscles, and tendons. Causes for ankle pain include:  Injury, including a break (fracture), sprain, or strain often due to a fall, sports, or a high-impact activity.  Swelling (inflammation) of a tendon (tendonitis).  Achilles tendon rupture.  Ankle instability after repeated sprains and strains.  Poor foot alignment.  Pressure on a nerve (tarsal tunnel syndrome).  Arthritis in the ankle or the lining of the ankle.  Crystal formation in the ankle (gout or pseudogout). DIAGNOSIS  A diagnosis is based on your medical history, your symptoms, results of your physical exam, and results of diagnostic tests. Diagnostic tests may include X-ray exams or a computerized magnetic scan (magnetic resonance imaging, MRI). TREATMENT    Treatment will depend on the cause of your ankle pain and may include:  Keeping pressure off the ankle and limiting activities.  Using crutches or other walking support (a cane or brace).  Using rest, ice, compression, and elevation.  Participating in physical therapy or home exercises.  Wearing shoe inserts or special shoes.  Losing weight.  Taking medications to reduce pain or swelling or receiving an injection.  Undergoing surgery. HOME CARE INSTRUCTIONS   Only take over-the-counter or prescription medicines for pain, discomfort, or fever as directed by your caregiver.  Put ice on the injured area.  Put ice in a plastic bag.  Place a towel between your skin and the bag.  Leave the ice on for 15-20 minutes at a time, 03-04 times a day.  Keep your leg raised (elevated) when possible to lessen swelling.  Avoid activities that cause ankle pain.  Follow specific exercises as directed by your caregiver.  Record how often you have ankle pain, the location of the pain, and what it feels like. This information may be helpful to you and your caregiver.  Ask your caregiver about returning to work or sports and whether you should drive.  Follow up with your caregiver for further examination, therapy, or testing as directed. SEEK MEDICAL CARE IF:   Pain or swelling continues or worsens beyond 1 week.  You have an oral temperature above 102 F (38.9 C).  You are feeling unwell or have chills.  You are having an increasingly difficult time with walking.  You have loss of sensation or other new symptoms.  You have questions or concerns. MAKE SURE YOU:   Understand these instructions.  Will watch your condition.  Will get help right away if you are not doing well or get worse.   This information is not intended to replace advice given to you by your health care provider. Make sure you discuss any questions you have with your health care provider.   Document  Released: 05/15/2010 Document Revised: 02/17/2012 Document Reviewed: 06/27/2015 Elsevier Interactive Patient Education [employer].

## 2015-12-03 NOTE — ED Provider Notes (Signed)
CSN: [id number]     Arrival date & time 12/03/15  1314 History  By signing my name below, I, Tina Mayo, attest that this documentation has been prepared under the direction and in the presence of [employer], PA-C. Electronically Signed: Julien Mayo, ED Scribe. 12/03/2015. 3:25 PM.    Chief Complaint  Patient presents with  . Leg Pain      The history is provided by the patient and medical records. No language interpreter was used.   HPI Comments: Tina Mayo is a 44 y.o. female who presents to the Emergency Department complaining of constant, gradual worsening right ankle pain onset two days ago. Pt reports falling down some steps on 12/23 and injured her right ankle. She did not hit her head or have loss of consciousness. She has a metal plate in her right ankle that has been there for the past 5 years. She has taken multiple OTC medications and elevating her leg to alleviate the pain with no relief. Pt notes applying an ACE bandage to her ankle that alleviates the pain minimally. She denies numbness, tingling, weakness, head injury and loss of consciousness.    Past Medical History  Diagnosis Date  . Depression   . Anxiety   . Asthma    Past Surgical History  Procedure Laterality Date  . Right ankle surgery      2010  . Tubal ligation    . Benign breast tumor removed      2006   Family History  Problem Relation Age of Onset  . Breast cancer Mother   . Cancer Mother   . Diabetes Mellitus II Father   . Diabetes Father    Social History  Substance Use Topics  . Smoking status: Current Every Day Smoker -- 0.50 packs/day    Types: Cigarettes  . Smokeless tobacco: Current User  . Alcohol Use: No   OB History    No data available     Review of Systems  Constitutional: Negative for fever and chills.  Gastrointestinal: Negative for nausea and vomiting.  Musculoskeletal: Positive for joint swelling and arthralgias. Negative for back pain, neck pain and  neck stiffness.  Skin: Negative for wound.  Neurological: Negative for weakness and numbness.  Hematological: Does not bruise/bleed easily.  Psychiatric/Behavioral: The patient is not nervous/anxious.   All other systems reviewed and are negative.     Allergies  Levaquin  Home Medications   Prior to Admission medications   Medication Sig Start Date End Date Taking? Authorizing Provider  albuterol (PROVENTIL HFA;VENTOLIN HFA) 108 (90 BASE) MCG/ACT inhaler Inhale 2 puffs into the lungs every 4 (four) hours as needed for wheezing or shortness of breath. 10/12/13   Shanker Kristeen Mans, MD  baclofen (LIORESAL) 10 MG tablet Take 1 tablet (10 mg total) by mouth at bedtime as needed for muscle spasms. 06/15/15   Lorayne Marek, MD  fluconazole (DIFLUCAN) 150 MG tablet Take 1 tablet (150 mg total) by mouth once. 05/02/15   Josalyn Funches, MD  gabapentin (NEURONTIN) 300 MG capsule Take 1 capsule (300 mg total) by mouth 3 (three) times daily. 06/28/15   Lorayne Marek, MD  HYDROcodone-acetaminophen (NORCO/VICODIN) 5-325 MG tablet Take 1 tablet by mouth every 6 (six) hours as needed for moderate pain or severe pain. 12/03/15   Javone Ybanez, PA-C  metroNIDAZOLE (FLAGYL) 500 MG tablet Take 1 tablet (500 mg total) by mouth 2 (two) times daily. 05/19/15   Josalyn Funches, MD  predniSONE (DELTASONE) 10 MG tablet Take  2 tablets (20 mg total) by mouth daily. 05/21/15   Tiffany Carlota Raspberry, PA-C  tobramycin (TOBREX) 0.3 % ophthalmic solution Place 2 drops into the right eye every 4 (four) hours. 08/09/15   Fransico Meadow, PA-C   Triage vitals: BP 110/80 mmHg  Pulse 115  Temp(Src) 99 F (37.2 C) (Oral)  Resp 14  SpO2 100% Physical Exam  Constitutional: She appears well-developed and well-nourished. No distress.  HENT:  Head: Normocephalic and atraumatic.  Eyes: Conjunctivae are normal.  Neck: Normal range of motion.  Cardiovascular: Normal rate, regular rhythm and intact distal pulses.   Capillary refill <  3 sec  Pulmonary/Chest: Effort normal and breath sounds normal.  Musculoskeletal: She exhibits tenderness. She exhibits no edema.  ROM: full ROM of right knee and all toes, somewhat decreased ROM of right ankle at baseline due to previous surgery, well healed surgical scar along lateral portion of ankle, no swelling or deformity  Neurological: She is alert. Coordination normal.  Sensation intact Strength 5/5 with dorsi flexion and plantar flexion which aggravates pain  Skin: Skin is warm and dry. She is not diaphoretic.  No tenting of the skin  Psychiatric: She has a normal mood and affect.  Nursing note and vitals reviewed.   ED Course  Procedures  DIAGNOSTIC STUDIES: Oxygen Saturation is 100% on RA, normal by my interpretation.  COORDINATION OF CARE:  2:03 PM Discussed treatment plan which includes shot of Toradol, x-ray of right ankle with pt at bedside and pt agreed to plan.  Labs Review Labs Reviewed - No data to display  Imaging Review Dg Ankle Complete Right  12/03/2015  CLINICAL DATA:  Status post fall down stairs 3 days ago with a right ankle injury. Continued pain. Initial encounter. EXAM: RIGHT ANKLE - COMPLETE 3+ VIEW COMPARISON:  None. FINDINGS: No acute bony or joint abnormality is identified. The patient has remote healed distal fibular fracture with fixation hardware in place. Soft tissues are unremarkable. IMPRESSION: No acute abnormality. Electronically Signed   By: Inge Rise M.D.   On: 12/03/2015 15:03   I have personally reviewed and evaluated these images and lab results as part of my medical decision-making.   EKG Interpretation None      MDM    Final diagnoses:  Arthralgia of right ankle    [name] presents with right ankle pain.  Patient X-Ray negative for obvious fracture or dislocation. Pain managed in ED. Pt advised to follow up with orthopedics if symptoms persist for possibility of missed fracture diagnosis. Patient given brace  while in ED, conservative therapy recommended and discussed. Patient will be dc home & is agreeable with above plan.  BP 110/80 mmHg  Pulse 115  Temp(Src) 99 F (37.2 C) (Oral)  Resp 14  SpO2 100%  I personally performed the services described in this documentation, which was scribed in my presence. The recorded information has been reviewed and is accurate.   Jarrett Soho Naveya Ellerman, PA-C 12/03/15 [place], MD 12/04/15 [phone]

## 2015-12-03 NOTE — ED Notes (Signed)
Pt reports to the ED for eval of right leg pain. She has hx of plate placed in her right ankle and reports she came down on her ankle wrong on 12/23 and it has been bothering her ever since. She has tried OTC medications, elevating the leg, and using an ace bandage without relief. Pt A&Ox4, resp e/u, and skin warm and dry.

## 2015-12-03 NOTE — ED Notes (Signed)
Crackers and ginger ale requested due to nausea.

## 2015-12-20 ENCOUNTER — Encounter (HOSPITAL_COMMUNITY): Payer: Self-pay | Admitting: Emergency Medicine

## 2015-12-20 ENCOUNTER — Emergency Department (HOSPITAL_COMMUNITY)
Admission: EM | Admit: 2015-12-20 | Discharge: 2015-12-20 | Disposition: A | Discharge status: Home / Self Care | Payer: Medicaid Other | Source: Home / Self Care | Attending: Emergency Medicine | Admitting: Emergency Medicine

## 2015-12-20 DIAGNOSIS — Z780 Asymptomatic menopausal state: Secondary | ICD-10-CM

## 2015-12-20 DIAGNOSIS — K047 Periapical abscess without sinus: Secondary | ICD-10-CM | POA: Diagnosis not present

## 2015-12-20 DIAGNOSIS — M7671 Peroneal tendinitis, right leg: Secondary | ICD-10-CM | POA: Diagnosis not present

## 2015-12-20 MED ORDER — HYDROCODONE-ACETAMINOPHEN 5-325 MG PO TABS: 1.0000 | ORAL_TABLET | Freq: Four times a day (QID) | ORAL | Status: DC | PRN

## 2015-12-20 MED ORDER — MELOXICAM 15 MG PO TABS: 15.0000 mg | ORAL_TABLET | Freq: Every day | ORAL | Status: DC

## 2015-12-20 MED ORDER — KETOROLAC TROMETHAMINE 60 MG/2ML IM SOLN
INTRAMUSCULAR | Status: AC
  Filled 2015-12-20: qty 2

## 2015-12-20 MED ORDER — AMOXICILLIN 500 MG PO CAPS: 500.0000 mg | ORAL_CAPSULE | Freq: Two times a day (BID) | ORAL | Status: DC

## 2015-12-20 MED ORDER — KETOROLAC TROMETHAMINE 60 MG/2ML IM SOLN
60.0000 mg | Freq: Once | INTRAMUSCULAR | Status: AC
  Administered 2015-12-20: 60 mg via INTRAMUSCULAR

## 2015-12-20 NOTE — ED Notes (Signed)
C/o right side dental pain States right side bottom pain States tooth needs to be removed No dentist  Right leg pain  Metal placed in leg 4 years ago States she fell recently was seen at the ER 12/02/2016 States pain radiates to joints

## 2015-12-20 NOTE — Discharge Instructions (Signed)
You have an infection around your tooth. Take amoxicillin twice a day for 10 days.  You have strained the peroneal tendons in your right ankle. Apply ice for 10-15 minutes several times a day, followed by heat. Do the exercises I showed you on the stairs to strengthen the tendons.  Take meloxicam daily for the next week to help with pain and inflammation. Use the Vicodin every 4-6 hours as needed for severe pain.  You can try black cohosh for the hot flashes. You can find this at a [facility] store.

## 2015-12-20 NOTE — ED Provider Notes (Signed)
CSN: [id number]     Arrival date & time 12/20/15  1303 History   First MD Initiated Contact with Patient 12/20/15 1338     Chief Complaint  Patient presents with  . Dental Pain  . Leg Pain   (Consider location/radiation/quality/duration/timing/severity/associated sxs/prior Treatment) HPI  She is a 45 year old woman here for 2 concerns.  She states about 2 days ago she developed pain in her right lower tooth. It is a throbbing ache. It has gradually been getting worse. She is unable to chew on that side. Over the last day she is also noted some swelling of the right lower jaw. No difficulty swallowing. No drainage.  She also reports pain in her right lateral ankle. She has a history of surgery on that ankle and has a plate in place. She twisted it coming down some stairs about 2 weeks ago and has had pain since then. She was seen in the ER at that time and had an x-ray that was negative for fracture. The pain is in the back lateral part of the ankle. It is worse with inversion. It is uncomfortable to walk. She states the pain has started going up her leg into her hip.  She has been doing warm soaks which do help some.  Past Medical History  Diagnosis Date  . Depression   . Anxiety   . Asthma    Past Surgical History  Procedure Laterality Date  . Right ankle surgery      2010  . Tubal ligation    . Benign breast tumor removed      2006   Family History  Problem Relation Age of Onset  . Breast cancer Mother   . Cancer Mother   . Diabetes Mellitus II Father   . Diabetes Father    Social History  Substance Use Topics  . Smoking status: Current Every Day Smoker -- 0.50 packs/day    Types: Cigarettes  . Smokeless tobacco: Current User  . Alcohol Use: No   OB History    No data available     Review of Systems As in history of present illness Allergies  Levaquin  Home Medications   Prior to Admission medications   Medication Sig Start Date End Date Taking? Authorizing  Provider  albuterol (PROVENTIL HFA;VENTOLIN HFA) 108 (90 BASE) MCG/ACT inhaler Inhale 2 puffs into the lungs every 4 (four) hours as needed for wheezing or shortness of breath. 10/12/13   Shanker Kristeen Mans, MD  amoxicillin (AMOXIL) 500 MG capsule Take 1 capsule (500 mg total) by mouth 2 (two) times daily. 12/20/15   Melony Overly, MD  baclofen (LIORESAL) 10 MG tablet Take 1 tablet (10 mg total) by mouth at bedtime as needed for muscle spasms. 06/15/15   Lorayne Marek, MD  fluconazole (DIFLUCAN) 150 MG tablet Take 1 tablet (150 mg total) by mouth once. 05/02/15   Josalyn Funches, MD  gabapentin (NEURONTIN) 300 MG capsule Take 1 capsule (300 mg total) by mouth 3 (three) times daily. 06/28/15   Lorayne Marek, MD  HYDROcodone-acetaminophen (NORCO) 5-325 MG tablet Take 1 tablet by mouth every 6 (six) hours as needed for moderate pain. 12/20/15   Melony Overly, MD  meloxicam (MOBIC) 15 MG tablet Take 1 tablet (15 mg total) by mouth daily. 12/20/15   Melony Overly, MD  metroNIDAZOLE (FLAGYL) 500 MG tablet Take 1 tablet (500 mg total) by mouth 2 (two) times daily. 05/19/15   Josalyn Funches, MD  predniSONE (DELTASONE) 10 MG  tablet Take 2 tablets (20 mg total) by mouth daily. 05/21/15   Tiffany Carlota Raspberry, PA-C  tobramycin (TOBREX) 0.3 % ophthalmic solution Place 2 drops into the right eye every 4 (four) hours. 08/09/15   Fransico Meadow, PA-C   Meds Ordered and Administered this Visit   Medications  ketorolac (TORADOL) injection 60 mg (not administered)    BP 114/88 mmHg  Pulse 111  Temp(Src) 98.2 F (36.8 C) (Oral)  Resp 16  SpO2 100% No data found.   Physical Exam  Constitutional: She is oriented to person, place, and time. She appears well-developed and well-nourished.  HENT:  Mouth/Throat:    She has very poor dentition. Upper teeth are dentures. Lower teeth have multiple cavities and broken teeth.  Cardiovascular: Normal rate.   Pulmonary/Chest: Effort normal.  Musculoskeletal:  Right ankle: Well  healed scar over the lateral malleolus. She has full active range of motion. There is minimal swelling primarily around the lateral malleolus. 2+ DP pulse. She has 5 out of 5 strength in the ankle, but pain with active and passive inversion. She is tender to palpation along the peroneal tendons.  Neurological: She is alert and oriented to person, place, and time.    ED Course  Procedures (including critical care time)  Labs Review Labs Reviewed - No data to display  Imaging Review No results found.    MDM   1. Dental infection   2. Peroneal tendonitis, right   3. Menopause    Amoxicillin for dental infection. Handout on low cost dental resources provided. ASO for peroneal tendinitis. Rehab exercises shown.  Meloxicam and Vicodin as needed for pain. Toradol given here. Patient also complained of hot flashes. Recommended trial of OTC Black cohosh. Follow-up with PCP or GYN for additional management.    Melony Overly, MD 12/20/15 1426

## 2016-01-04 ENCOUNTER — Encounter: Payer: Self-pay | Admitting: Internal Medicine

## 2016-01-04 ENCOUNTER — Ambulatory Visit: Payer: Medicaid Other | Attending: Internal Medicine | Admitting: Internal Medicine

## 2016-01-04 VITALS — BP 122/86 | HR 78 | Temp 98.0°F | Resp 17 | Ht 67.0 in | Wt 195.4 lb

## 2016-01-04 DIAGNOSIS — F419 Anxiety disorder, unspecified: Secondary | ICD-10-CM | POA: Diagnosis not present

## 2016-01-04 DIAGNOSIS — N951 Menopausal and female climacteric states: Secondary | ICD-10-CM

## 2016-01-04 DIAGNOSIS — M67472 Ganglion, left ankle and foot: Secondary | ICD-10-CM | POA: Diagnosis not present

## 2016-01-04 DIAGNOSIS — J45909 Unspecified asthma, uncomplicated: Secondary | ICD-10-CM | POA: Diagnosis not present

## 2016-01-04 DIAGNOSIS — N644 Mastodynia: Secondary | ICD-10-CM | POA: Diagnosis not present

## 2016-01-04 DIAGNOSIS — F17210 Nicotine dependence, cigarettes, uncomplicated: Secondary | ICD-10-CM | POA: Insufficient documentation

## 2016-01-04 DIAGNOSIS — Z888 Allergy status to other drugs, medicaments and biological substances status: Secondary | ICD-10-CM | POA: Insufficient documentation

## 2016-01-04 DIAGNOSIS — Z780 Asymptomatic menopausal state: Secondary | ICD-10-CM | POA: Diagnosis not present

## 2016-01-04 DIAGNOSIS — Z803 Family history of malignant neoplasm of breast: Secondary | ICD-10-CM | POA: Insufficient documentation

## 2016-01-04 DIAGNOSIS — F329 Major depressive disorder, single episode, unspecified: Secondary | ICD-10-CM | POA: Insufficient documentation

## 2016-01-04 DIAGNOSIS — Z79899 Other long term (current) drug therapy: Secondary | ICD-10-CM | POA: Insufficient documentation

## 2016-01-04 NOTE — Progress Notes (Signed)
Patient complains of bilateral breast soreness And uncontrolled hot flashes  Has been using OTC Estroven with little relief

## 2016-01-04 NOTE — Patient Instructions (Signed)
Black Cohosh

## 2016-01-04 NOTE — Progress Notes (Signed)
Patient ID: Tina Mayo, female   DOB: 30-Apr-1971, 45 y.o.   MRN: [id number]  CC: breast soreness, hot flashes  HPI: Tina Mayo is a 45 y.o. female here today for a follow up visit.  Patient has past medical history of depression and anxiety. Patient reports that she has been having hot flashes and abnormal menstrual cycles for the past 2 years. She often has her period for a couple of months and then skips a month. She has noticed progressively worse hot flashes over the past couple of months. She has to sleep with several fans on her and she wakes up in a pool of sweat. 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 She is a daily smoker and has a family history of breast cancer.  Patient is also concerned about repeat cyst that appear on the top of her feet. She has been seen by podiatry in the past and had several cyst drained from her foot. She now has a large cyst on the dorsal part of her left foot that is tender to touch.  Allergies  Allergen Reactions  . Levaquin [Levofloxacin In D5w] Anaphylaxis   Past Medical History  Diagnosis Date  . Depression   . Anxiety   . Asthma    Current Outpatient Prescriptions on File Prior to Visit  Medication Sig Dispense Refill  . meloxicam (MOBIC) 15 MG tablet Take 1 tablet (15 mg total) by mouth daily. 30 tablet 0  . albuterol (PROVENTIL HFA;VENTOLIN HFA) 108 (90 BASE) MCG/ACT inhaler Inhale 2 puffs into the lungs every 4 (four) hours as needed for wheezing or shortness of breath. 1 Inhaler 3  . amoxicillin (AMOXIL) 500 MG capsule Take 1 capsule (500 mg total) by mouth 2 (two) times daily. 20 capsule 0  . baclofen (LIORESAL) 10 MG tablet Take 1 tablet (10 mg total) by mouth at bedtime as needed for muscle spasms. 30 each 1  . fluconazole (DIFLUCAN) 150 MG tablet Take 1 tablet (150 mg total) by mouth once. 1 tablet 0  . gabapentin (NEURONTIN) 300 MG capsule Take 1 capsule (300 mg total) by mouth 3 (three) times daily. 90 capsule 3  . HYDROcodone-acetaminophen  (NORCO) 5-325 MG tablet Take 1 tablet by mouth every 6 (six) hours as needed for moderate pain. 20 tablet 0  . metroNIDAZOLE (FLAGYL) 500 MG tablet Take 1 tablet (500 mg total) by mouth 2 (two) times daily. 14 tablet 0  . predniSONE (DELTASONE) 10 MG tablet Take 2 tablets (20 mg total) by mouth daily. 21 tablet 0  . tobramycin (TOBREX) 0.3 % ophthalmic solution Place 2 drops into the right eye every 4 (four) hours. 5 mL 0   No current facility-administered medications on file prior to visit.   Family History  Problem Relation Age of Onset  . Breast cancer Mother   . Cancer Mother   . Diabetes Mellitus II Father   . Diabetes Father    Social History   Social History  . Marital Status: Single    Spouse Name: N/A  . Number of Children: N/A  . Years of Education: N/A   Occupational History  . Not on file.   Social History Main Topics  . Smoking status: Current Every Day Smoker -- 0.50 packs/day    Types: Cigarettes  . Smokeless tobacco: Current User  . Alcohol Use: No  . Drug Use: No  . Sexual Activity: Not on file   Other Topics Concern  . Not on file   Social History Narrative  Review of Systems: Other than what is stated in HPI, all other systems are negative.   Objective:   Filed Vitals:   01/04/16 1538  BP: 122/86  Pulse: 78  Temp: 98 F (36.7 C)  Resp: 17    Physical Exam  Constitutional: She is oriented to person, place, and time.  Cardiovascular: Normal rate, regular rhythm and normal heart sounds.   Pulmonary/Chest: Effort normal and breath sounds normal.  Musculoskeletal: Normal range of motion. She exhibits tenderness (cyst).  Left foot dorsal ganglion cyst  Neurological: She is alert and oriented to person, place, and time.  Skin: Skin is warm and dry.  Psychiatric:  Very anxious and loud     Lab Results  Component Value Date   WBC 8.7 04/25/2015   HGB 12.4 04/25/2015   HCT 36.2 04/25/2015   MCV 85.6 04/25/2015   PLT 84* 04/25/2015    Lab Results  Component Value Date   CREATININE 0.78 04/25/2015   BUN 11 04/25/2015   NA 138 04/25/2015   K 5.1 04/25/2015   CL 107 04/25/2015   CO2 22 04/25/2015    No results found for: HGBA1C Lipid Panel  No results found for: CHOL, TRIG, HDL, CHOLHDL, VLDL, LDLCALC     Assessment and plan:   Tina Mayo was seen today for hot flashes.  Diagnoses and all orders for this visit:  Menopausal syndrome (hot flashes) -     Ambulatory referral to Gynecology Patient reports that she would like to see GYN to figure out what else can be done for here. I have explained that she is not a candidate for estrogen therapy due to tobacco use. I have explained possible risk and offered her black cohosh or anti-depressants. She states that anti-depressants make her crazy and refused.   Breast pain -     MM Digital Diagnostic Bilat; Future Likely due to hormones. Saw that patient had diagnostic ordered last year due to mass of breast.  Ganglion cyst of left foot -     Ambulatory referral to Podiatry   Return if symptoms worsen or fail to improve.       Lance Bosch, [facility] [phone] 01/04/2016, 3:52 PM

## 2016-01-08 ENCOUNTER — Encounter: Payer: Self-pay | Admitting: Family Medicine

## 2016-01-08 ENCOUNTER — Emergency Department (INDEPENDENT_AMBULATORY_CARE_PROVIDER_SITE_OTHER)
Admission: EM | Admit: 2016-01-08 | Discharge: 2016-01-08 | Disposition: A | Discharge status: Home / Self Care | Payer: Medicaid Other | Source: Home / Self Care | Attending: Family Medicine | Admitting: Family Medicine

## 2016-01-08 ENCOUNTER — Encounter (HOSPITAL_COMMUNITY): Payer: Self-pay | Admitting: *Deleted

## 2016-01-08 DIAGNOSIS — M7552 Bursitis of left shoulder: Secondary | ICD-10-CM

## 2016-01-08 MED ORDER — DICLOFENAC SODIUM 1 % TD GEL: 4.0000 g | Freq: Four times a day (QID) | TRANSDERMAL | Status: DC

## 2016-01-08 NOTE — ED Provider Notes (Signed)
CSN: [id number]     Arrival date & time 01/08/16  1259 History   First MD Initiated Contact with Patient 01/08/16 1326     Chief Complaint  Patient presents with  . Shoulder Pain   (Consider location/radiation/quality/duration/timing/severity/associated sxs/prior Treatment) Patient is a 45 y.o. female presenting with shoulder pain. The history is provided by the patient.  Shoulder Pain Location:  Shoulder Time since incident:  3 days Injury: no (does house cleaning and did so on fri with sx on sat)   Shoulder location:  L shoulder Pain details:    Quality:  Sharp   Severity:  Moderate   Onset quality:  Gradual   Progression:  Worsening Chronicity:  New Dislocation: no   Prior injury to area:  No Relieved by:  Nothing Associated symptoms: decreased range of motion, neck pain and stiffness   Associated symptoms: no back pain, no numbness and no swelling     Past Medical History  Diagnosis Date  . Depression   . Anxiety   . Asthma    Past Surgical History  Procedure Laterality Date  . Right ankle surgery      2010  . Tubal ligation    . Benign breast tumor removed      2006   Family History  Problem Relation Age of Onset  . Breast cancer Mother   . Cancer Mother   . Diabetes Mellitus II Father   . Diabetes Father    Social History  Substance Use Topics  . Smoking status: Current Every Day Smoker -- 0.50 packs/day    Types: Cigarettes  . Smokeless tobacco: Current User  . Alcohol Use: No   OB History    No data available     Review of Systems  Constitutional: Negative.   Musculoskeletal: Positive for joint swelling, stiffness and neck pain. Negative for back pain and gait problem.  Skin: Negative.   All other systems reviewed and are negative.   Allergies  Levaquin  Home Medications   Prior to Admission medications   Medication Sig Start Date End Date Taking? Authorizing Provider  albuterol (PROVENTIL HFA;VENTOLIN HFA) 108 (90 BASE) MCG/ACT inhaler  Inhale 2 puffs into the lungs every 4 (four) hours as needed for wheezing or shortness of breath. 10/12/13   Shanker Kristeen Mans, MD  amoxicillin (AMOXIL) 500 MG capsule Take 1 capsule (500 mg total) by mouth 2 (two) times daily. 12/20/15   Melony Overly, MD  baclofen (LIORESAL) 10 MG tablet Take 1 tablet (10 mg total) by mouth at bedtime as needed for muscle spasms. 06/15/15   Lorayne Marek, MD  diclofenac sodium (VOLTAREN) 1 % GEL Apply 4 g topically 4 (four) times daily. Please instruct in dosing. 01/08/16   Billy Fischer, MD  fluconazole (DIFLUCAN) 150 MG tablet Take 1 tablet (150 mg total) by mouth once. 05/02/15   Josalyn Funches, MD  gabapentin (NEURONTIN) 300 MG capsule Take 1 capsule (300 mg total) by mouth 3 (three) times daily. 06/28/15   Lorayne Marek, MD  HYDROcodone-acetaminophen (NORCO) 5-325 MG tablet Take 1 tablet by mouth every 6 (six) hours as needed for moderate pain. 12/20/15   Melony Overly, MD  meloxicam (MOBIC) 15 MG tablet Take 1 tablet (15 mg total) by mouth daily. 12/20/15   Melony Overly, MD  metroNIDAZOLE (FLAGYL) 500 MG tablet Take 1 tablet (500 mg total) by mouth 2 (two) times daily. 05/19/15   Josalyn Funches, MD  predniSONE (DELTASONE) 10 MG tablet Take 2  tablets (20 mg total) by mouth daily. 05/21/15   Tiffany Carlota Raspberry, PA-C  tobramycin (TOBREX) 0.3 % ophthalmic solution Place 2 drops into the right eye every 4 (four) hours. 08/09/15   Fransico Meadow, PA-C   Meds Ordered and Administered this Visit  Medications - No data to display  BP 112/78 mmHg  Pulse 78  Temp(Src) 98.6 F (37 C) (Oral)  Resp 18  SpO2 100% No data found.   Physical Exam  Constitutional: She is oriented to person, place, and time. She appears well-developed and well-nourished.  Neck: Normal range of motion. Neck supple.  Musculoskeletal: Normal range of motion. She exhibits tenderness.  Neurological: She is alert and oriented to person, place, and time.  Skin: Skin is warm and dry.  Nursing note and  vitals reviewed.   ED Course  Procedures (including critical care time)  Labs Review Labs Reviewed - No data to display  Imaging Review No results found.   Visual Acuity Review  Right Eye Distance:   Left Eye Distance:   Bilateral Distance:    Right Eye Near:   Left Eye Near:    Bilateral Near:         MDM   1. Acute shoulder bursitis, left        Billy Fischer, MD 01/09/16 2045

## 2016-01-08 NOTE — Discharge Instructions (Signed)
Alternate 15 min with ice and heat with the medicine on your shoulder, see orthopedist if further problems.

## 2016-01-08 NOTE — ED Notes (Addendum)
Pt  Reports           Pain l  Shoulder       And  r  Leg   She  denys  Any  specefic  Injury    And  Reports     Pain is  Worse  On movement  And  Position              She  Reports    Was  Seen  At the  [facility]  4  Days  Ago         She  Ambulated  To room  With a  Steady  Fluid  Gait    Pt  wea  Seen  At the  [facility]

## 2016-01-18 ENCOUNTER — Encounter: Payer: Medicaid Other | Admitting: Podiatry

## 2016-01-18 ENCOUNTER — Other Ambulatory Visit: Payer: Self-pay | Admitting: Internal Medicine

## 2016-01-22 ENCOUNTER — Other Ambulatory Visit: Payer: Self-pay | Admitting: Internal Medicine

## 2016-01-22 DIAGNOSIS — N644 Mastodynia: Secondary | ICD-10-CM

## 2016-01-26 ENCOUNTER — Encounter: Payer: Medicaid Other | Admitting: Family Medicine

## 2016-02-09 ENCOUNTER — Emergency Department (INDEPENDENT_AMBULATORY_CARE_PROVIDER_SITE_OTHER): Payer: Medicaid Other

## 2016-02-09 ENCOUNTER — Encounter (HOSPITAL_COMMUNITY): Payer: Self-pay | Admitting: *Deleted

## 2016-02-09 ENCOUNTER — Emergency Department (HOSPITAL_COMMUNITY)
Admission: EM | Admit: 2016-02-09 | Discharge: 2016-02-09 | Disposition: A | Discharge status: Home / Self Care | Payer: Medicaid Other | Source: Home / Self Care | Attending: Family Medicine | Admitting: Family Medicine

## 2016-02-09 DIAGNOSIS — S300XXA Contusion of lower back and pelvis, initial encounter: Secondary | ICD-10-CM | POA: Diagnosis not present

## 2016-02-09 MED ORDER — DICLOFENAC POTASSIUM 50 MG PO TABS: 50.0000 mg | ORAL_TABLET | Freq: Three times a day (TID) | ORAL | Status: DC

## 2016-02-09 NOTE — Discharge Instructions (Signed)
See your doctor if further problems. °

## 2016-02-09 NOTE — ED Notes (Addendum)
Pt  Reports     She  Fell  Off  A  hoverboard   And      inj  Her  Buttocks      And  Lower  Sacral      Area      l  Shoulder    She  Reports  Pain   Not  releived  By otc  meds

## 2016-02-09 NOTE — ED Provider Notes (Signed)
CSN: [id number]     Arrival date & time 02/09/16  1640 History   First MD Initiated Contact with Patient 02/09/16 1842     Chief Complaint  Patient presents with  . Fall   (Consider location/radiation/quality/duration/timing/severity/associated sxs/prior Treatment) Patient is a 45 y.o. female presenting with fall. The history is provided by the patient.  Fall This is a new problem. The current episode started more than 2 days ago (stepped onto a hoverboard and fell back onto buttocks, no neuro sx no gisx, ambulatory.). The problem has not changed since onset.Pertinent negatives include no chest pain, no abdominal pain and no shortness of breath.    Past Medical History  Diagnosis Date  . Depression   . Anxiety   . Asthma    Past Surgical History  Procedure Laterality Date  . Right ankle surgery      2010  . Tubal ligation    . Benign breast tumor removed      2006   Family History  Problem Relation Age of Onset  . Breast cancer Mother   . Cancer Mother   . Diabetes Mellitus II Father   . Diabetes Father    Social History  Substance Use Topics  . Smoking status: Current Every Day Smoker -- 0.50 packs/day    Types: Cigarettes  . Smokeless tobacco: Current User  . Alcohol Use: No   OB History    No data available     Review of Systems  Constitutional: Negative.   Respiratory: Negative for shortness of breath.   Cardiovascular: Negative.  Negative for chest pain.  Gastrointestinal: Negative.  Negative for abdominal pain.  Genitourinary: Negative.   Musculoskeletal: Positive for back pain. Negative for myalgias, joint swelling and gait problem.  All other systems reviewed and are negative.   Allergies  Levaquin  Home Medications   Prior to Admission medications   Medication Sig Start Date End Date Taking? Authorizing Provider  albuterol (PROVENTIL HFA;VENTOLIN HFA) 108 (90 BASE) MCG/ACT inhaler Inhale 2 puffs into the lungs every 4 (four) hours as needed for  wheezing or shortness of breath. 10/12/13   Shanker Kristeen Mans, MD  amoxicillin (AMOXIL) 500 MG capsule Take 1 capsule (500 mg total) by mouth 2 (two) times daily. 12/20/15   Melony Overly, MD  baclofen (LIORESAL) 10 MG tablet Take 1 tablet (10 mg total) by mouth at bedtime as needed for muscle spasms. 06/15/15   Lorayne Marek, MD  diclofenac sodium (VOLTAREN) 1 % GEL Apply 4 g topically 4 (four) times daily. Please instruct in dosing. 01/08/16   Billy Fischer, MD  fluconazole (DIFLUCAN) 150 MG tablet Take 1 tablet (150 mg total) by mouth once. 05/02/15   Josalyn Funches, MD  gabapentin (NEURONTIN) 300 MG capsule Take 1 capsule (300 mg total) by mouth 3 (three) times daily. 06/28/15   Lorayne Marek, MD  HYDROcodone-acetaminophen (NORCO) 5-325 MG tablet Take 1 tablet by mouth every 6 (six) hours as needed for moderate pain. 12/20/15   Melony Overly, MD  meloxicam (MOBIC) 15 MG tablet Take 1 tablet (15 mg total) by mouth daily. 12/20/15   Melony Overly, MD  metroNIDAZOLE (FLAGYL) 500 MG tablet Take 1 tablet (500 mg total) by mouth 2 (two) times daily. 05/19/15   Josalyn Funches, MD  predniSONE (DELTASONE) 10 MG tablet Take 2 tablets (20 mg total) by mouth daily. 05/21/15   Tiffany Carlota Raspberry, PA-C  tobramycin (TOBREX) 0.3 % ophthalmic solution Place 2 drops into the right eye  every 4 (four) hours. 08/09/15   Fransico Meadow, PA-C   Meds Ordered and Administered this Visit  Medications - No data to display  BP 124/77 mmHg  Pulse 82  Temp(Src) 98 F (36.7 C) (Oral)  Resp 16  SpO2 98% No data found.   Physical Exam  Constitutional: She is oriented to person, place, and time. She appears well-developed and well-nourished. No distress.  Abdominal: Soft. Bowel sounds are normal. She exhibits no distension. There is no tenderness. There is no rebound.  Musculoskeletal: She exhibits tenderness.       Lumbar back: She exhibits tenderness, bony tenderness, pain and spasm. She exhibits no swelling, no edema, no deformity,  no laceration and normal pulse.       Back:  Neurological: She is alert and oriented to person, place, and time.  Skin: Skin is warm and dry.  Nursing note and vitals reviewed.   ED Course  Procedures (including critical care time)  Labs Review Labs Reviewed - No data to display  Imaging Review No results found. X-rays reviewed and report per radiologist.   Visual Acuity Review  Right Eye Distance:   Left Eye Distance:   Bilateral Distance:    Right Eye Near:   Left Eye Near:    Bilateral Near:         MDM  No diagnosis found.  Meds ordered this encounter  Medications  . diclofenac (CATAFLAM) 50 MG tablet    Sig: Take 1 tablet (50 mg total) by mouth 3 (three) times daily. For pain.    Dispense:  30 tablet    Refill:  0   .    Billy Fischer, MD 02/09/16 [phone]

## 2016-02-21 ENCOUNTER — Ambulatory Visit: Payer: Medicaid Other | Admitting: Internal Medicine

## 2016-02-27 ENCOUNTER — Emergency Department (HOSPITAL_COMMUNITY)
Admission: EM | Admit: 2016-02-27 | Discharge: 2016-02-27 | Disposition: A | Discharge status: Home / Self Care | Payer: Medicaid Other | Attending: Emergency Medicine | Admitting: Emergency Medicine

## 2016-02-27 ENCOUNTER — Emergency Department (HOSPITAL_COMMUNITY): Payer: Medicaid Other

## 2016-02-27 ENCOUNTER — Emergency Department (HOSPITAL_COMMUNITY)
Admission: EM | Admit: 2016-02-27 | Discharge: 2016-02-27 | Disposition: A | Discharge status: Home / Self Care | Payer: Medicaid Other | Source: Home / Self Care

## 2016-02-27 ENCOUNTER — Encounter (HOSPITAL_COMMUNITY): Payer: Self-pay | Admitting: Emergency Medicine

## 2016-02-27 DIAGNOSIS — Z791 Long term (current) use of non-steroidal anti-inflammatories (NSAID): Secondary | ICD-10-CM | POA: Insufficient documentation

## 2016-02-27 DIAGNOSIS — M25512 Pain in left shoulder: Secondary | ICD-10-CM | POA: Diagnosis not present

## 2016-02-27 DIAGNOSIS — Z87828 Personal history of other (healed) physical injury and trauma: Secondary | ICD-10-CM | POA: Insufficient documentation

## 2016-02-27 DIAGNOSIS — F419 Anxiety disorder, unspecified: Secondary | ICD-10-CM | POA: Diagnosis not present

## 2016-02-27 DIAGNOSIS — J45909 Unspecified asthma, uncomplicated: Secondary | ICD-10-CM | POA: Diagnosis not present

## 2016-02-27 DIAGNOSIS — F17210 Nicotine dependence, cigarettes, uncomplicated: Secondary | ICD-10-CM | POA: Diagnosis not present

## 2016-02-27 DIAGNOSIS — F329 Major depressive disorder, single episode, unspecified: Secondary | ICD-10-CM | POA: Diagnosis not present

## 2016-02-27 DIAGNOSIS — Z79899 Other long term (current) drug therapy: Secondary | ICD-10-CM | POA: Insufficient documentation

## 2016-02-27 MED ORDER — CYCLOBENZAPRINE HCL 10 MG PO TABS: 10.0000 mg | ORAL_TABLET | Freq: Three times a day (TID) | ORAL | Status: DC | PRN

## 2016-02-27 MED ORDER — KETOROLAC TROMETHAMINE 30 MG/ML IJ SOLN
30.0000 mg | Freq: Once | INTRAMUSCULAR | Status: AC
  Administered 2016-02-27: 30 mg via INTRAMUSCULAR
  Filled 2016-02-27: qty 1

## 2016-02-27 NOTE — ED Provider Notes (Signed)
CSN: [id number]     Arrival date & time 02/27/16  0539 History   First MD Initiated Contact with Patient 02/27/16 0700     Chief Complaint  Patient presents with  . Fall  . Arm Pain     (Consider location/radiation/quality/duration/timing/severity/associated sxs/prior Treatment) HPI Comments: 45 year old female who presents with left shoulder pain. Approximately 2-3 weeks ago, the patient fell back off of a however board and landed on her left shoulder. Since then, she has had persistent, severe pain in her shoulder unrelieved by over-the-counter pain medications and topical pain relieving medications. She was evaluated on 3/3 for her symptoms and given diclofenac without relief. Patient states that pain is worse with movement and she has been up all night because of the pain. No fevers.  Patient is a 45 y.o. female presenting with fall and arm pain. The history is provided by the patient.  Fall  Arm Pain    Past Medical History  Diagnosis Date  . Depression   . Anxiety   . Asthma    Past Surgical History  Procedure Laterality Date  . Right ankle surgery      2010  . Tubal ligation    . Benign breast tumor removed      2006   Family History  Problem Relation Age of Onset  . Breast cancer Mother   . Cancer Mother   . Diabetes Mellitus II Father   . Diabetes Father    Social History  Substance Use Topics  . Smoking status: Current Every Day Smoker -- 0.00 packs/day    Types: Cigarettes  . Smokeless tobacco: Current User  . Alcohol Use: No   OB History    No data available     Review of Systems 10 Systems reviewed and are negative for acute change except as noted in the HPI.    Allergies  Levaquin  Home Medications   Prior to Admission medications   Medication Sig Start Date End Date Taking? Authorizing Provider  acetaminophen (TYLENOL) 650 MG CR tablet Take 650 mg by mouth every 8 (eight) hours as needed for pain.   Yes Historical Provider, MD   diclofenac (CATAFLAM) 50 MG tablet Take 1 tablet (50 mg total) by mouth 3 (three) times daily. For pain. 02/09/16  Yes Billy Fischer, MD  ibuprofen (ADVIL,MOTRIN) 200 MG tablet Take 400 mg by mouth every 6 (six) hours as needed for moderate pain.   Yes Historical Provider, MD  loratadine (CLARITIN) 10 MG tablet Take 10 mg by mouth daily.   Yes Historical Provider, MD  Menthol, Topical Analgesic, (ICY HOT EX) Apply 1 application topically daily as needed.   Yes Historical Provider, MD  naproxen sodium (ANAPROX) 220 MG tablet Take 220 mg by mouth 2 (two) times daily with a meal.   Yes Historical Provider, MD  baclofen (LIORESAL) 10 MG tablet Take 1 tablet (10 mg total) by mouth at bedtime as needed for muscle spasms. Patient not taking: Reported on 02/27/2016 06/15/15   Lorayne Marek, MD  cyclobenzaprine (FLEXERIL) 10 MG tablet Take 1 tablet (10 mg total) by mouth 3 (three) times daily as needed for muscle spasms. 02/27/16   Sharlett Iles, MD  gabapentin (NEURONTIN) 300 MG capsule Take 1 capsule (300 mg total) by mouth 3 (three) times daily. Patient not taking: Reported on 02/27/2016 06/28/15   Lorayne Marek, MD   BP 120/84 mmHg  Pulse 93  Temp(Src) 97.6 F (36.4 C) (Oral)  Resp 16  SpO2 98%  Physical Exam  Constitutional: She appears well-developed and well-nourished.  Crying, holding left arm  HENT:  Head: Normocephalic and atraumatic.  Eyes: Conjunctivae are normal.  Cardiovascular: Normal rate, regular rhythm, normal heart sounds and intact distal pulses.   No murmur heard. Pulmonary/Chest: Effort normal and breath sounds normal. No respiratory distress.  Musculoskeletal: She exhibits no edema.  Difficulty examining left arm secondary to noncompliance, arm held in flexion at elbow and shoulder adduction; normal sensation L hand, unwilling to move shoulder but no obvious deformity  Skin: Skin is warm and dry.  Psychiatric:  Crying, hysterical, pressured speech  Nursing note and vitals  reviewed.   ED Course  Procedures (including critical care time) Labs Review Labs Reviewed - No data to display  Imaging Review Dg Shoulder Left  02/27/2016  CLINICAL DATA:  Left shoulder injury 3 weeks ago falling, proximal left humeral pain EXAM: LEFT SHOULDER - 2+ VIEW COMPARISON:  None. FINDINGS: Three views of the left shoulder submitted. No acute fracture or subluxation. Narrowing of joint space noted glenohumeral joint. AC joint is unremarkable. There is patchy sclerosis left humeral head suspicious for avascular necrosis. Mild spurring of humeral head. IMPRESSION: No acute fracture or subluxation. Degenerative changes glenohumeral joint. Mild spurring of humeral head. Patchy sclerosis humeral head suspicious for a vascular necrosis. Electronically Signed   By: Lahoma Crocker M.D.   On: 02/27/2016 07:55   Dg Humerus Left  02/27/2016  CLINICAL DATA:  INJURED LEFT SHOULDER 3 WEEKS AGO IN A FALL.PROXIMAL LEFT HUMERUS/SHOULDER PAIN THAT RADIATES DOWN ARM EXAM: LEFT HUMERUS - 2+ VIEW COMPARISON:  Chest radiograph 12/30/2013. FINDINGS: No fracture dislocation of the LEFT humerus. There is sclerosis within the LEFT humeral head, unchanged from prior, suggesting remote trauma. IMPRESSION: No acute fracture or dislocation. Electronically Signed   By: Suzy Bouchard M.D.   On: 02/27/2016 07:56      EKG Interpretation None     Medications  ketorolac (TORADOL) 30 MG/ML injection 30 mg (not administered)    MDM   Final diagnoses:  Left shoulder pain   Pt w/ persistent L arm pain after falling off hoverboard several weeks ago. She was crying and holding her left arm on exam, it was difficult to examine her shoulder secondary to noncompliance. No obvious deformity of arm and she was neurovascularly intact distally. No warmth or redness and no fever to suggest infection. Plain films of shoulder and humerus are negative acute, chronic arthritis changes. Discussed supportive care and gave dose of  Toradol here. Provided with Flexeril to use at home. Provided with sling for comfort but instructed on early range of motion. Patient voiced understanding and was discharged in satisfactory condition.   Sharlett Iles, MD 02/27/16 [phone]

## 2016-02-27 NOTE — Discharge Instructions (Signed)

## 2016-02-27 NOTE — ED Notes (Signed)
Pt. reports chronic left arm pain injured from a fall 3 weeks ago unrelieved by prescription  pain medications , pain increases with movement .

## 2016-03-04 ENCOUNTER — Emergency Department (INDEPENDENT_AMBULATORY_CARE_PROVIDER_SITE_OTHER)
Admission: EM | Admit: 2016-03-04 | Discharge: 2016-03-04 | Disposition: A | Discharge status: Home / Self Care | Payer: Medicaid Other | Source: Home / Self Care | Attending: Family Medicine | Admitting: Family Medicine

## 2016-03-04 ENCOUNTER — Encounter (HOSPITAL_COMMUNITY): Payer: Self-pay | Admitting: Emergency Medicine

## 2016-03-04 DIAGNOSIS — IMO0002 Reserved for concepts with insufficient information to code with codable children: Secondary | ICD-10-CM

## 2016-03-04 DIAGNOSIS — M7702 Medial epicondylitis, left elbow: Secondary | ICD-10-CM | POA: Diagnosis not present

## 2016-03-04 DIAGNOSIS — M659 Synovitis and tenosynovitis, unspecified: Secondary | ICD-10-CM | POA: Diagnosis not present

## 2016-03-04 MED ORDER — INDOMETHACIN 50 MG PO CAPS: 50.0000 mg | ORAL_CAPSULE | Freq: Two times a day (BID) | ORAL | Status: DC

## 2016-03-04 NOTE — Discharge Instructions (Signed)
Medial Epicondylitis With Rehab Medial epicondylitis involves inflammation and pain around the inner (medial) portion of the elbow. This pain is caused by inflammation of the tendons in the forearm that flex (bring down) the wrist. Medial epicondylitis is also called golfer's elbow, because it is common among golfers. However, it may occur in any individual who flexes the wrist regularly. If medial epicondylitis is left untreated, it may become a chronic problem. SYMPTOMS   Pain, tenderness, or inflammation over the inner (medial) side of the elbow.  Pain or weakness with gripping activities.  Pain that increases with wrist twisting motions (using a screwdriver, playing golf, bowling). CAUSES  Medial epicondylitis is caused by inflammation of the tendons that flex the wrist. Causes of injury may include:  Chronic, repetitive stress and strain to the tendons that run from the wrist and forearm to the elbow.  Sudden strain on the forearm, including wrist snap when serving balls with racquet sports, or throwing a baseball. RISK INCREASES WITH:  Sports or occupations that require repetitive and/or strenuous forearm and wrist movements (pitching a baseball, golfing, carpentry).  Poor wrist and forearm strength and flexibility.  Failure to warm up properly before activity.  Resuming activity before healing, rehabilitation, and conditioning are complete. PREVENTION   Warm up and stretch properly before activity.  Maintain physical fitness:  Strength, flexibility, and endurance.  Cardiovascular fitness.  Wear and use properly fitted equipment.  Learn and use proper technique and have a coach correct improper technique.  Wear a tennis elbow (counterforce) brace. PROGNOSIS  The course of this condition depends on the degree of the injury. If treated properly, acute cases (symptoms lasting less than 4 weeks) are often resolved in 2 to 6 weeks. Chronic (longer lasting cases) often resolve  in 3 to 6 months, but may require physical therapy. RELATED COMPLICATIONS   Frequently recurring symptoms, resulting in a chronic problem. Properly treating the problem the first time decreases frequency of recurrence.  Chronic inflammation, scarring, and partial tendon tear, requiring surgery.  Delayed healing or resolution of symptoms. TREATMENT  Treatment first involves the use of ice and medicine, to reduce pain and inflammation. Strengthening and stretching exercises may reduce discomfort, if performed regularly. These exercises may be performed at home, if the condition is an acute injury. Chronic cases may require a referral to a physical therapist for evaluation and treatment. Your caregiver may advise a corticosteroid injection to help reduce inflammation. Rarely, surgery is needed. MEDICATION  If pain medicine is needed, nonsteroidal anti-inflammatory medicines (aspirin and ibuprofen), or other minor pain relievers (acetaminophen), are often advised.  Do not take pain medicine for 7 days before surgery.  Prescription pain relievers may be given, if your caregiver thinks they are needed. Use only as directed and only as much as you need.  Corticosteroid injections may be recommended. These injections should be reserved only for the most severe cases, because they can only be given a certain number of times. HEAT AND COLD  Cold treatment (icing) should be applied for 10 to 15 minutes every 2 to 3 hours for inflammation and pain, and immediately after activity that aggravates your symptoms. Use ice packs or an ice massage.  Heat treatment may be used before performing stretching and strengthening activities prescribed by your caregiver, physical therapist, or athletic trainer. Use a heat pack or a warm water soak. SEEK MEDICAL CARE IF: Symptoms get worse or do not improve in 2 weeks, despite treatment. EXERCISES  RANGE OF MOTION (ROM) AND   STRETCHING EXERCISES - Epicondylitis, Medial  (Golfer's Elbow) These exercises may help you when beginning to rehabilitate your injury. Your symptoms may go away with or without further involvement from your physician, physical therapist or athletic trainer. While completing these exercises, remember:   Restoring tissue flexibility helps normal motion to return to the joints. This allows healthier, less painful movement and activity.  An effective stretch should be held for at least 30 seconds.  A stretch should never be painful. You should only feel a gentle lengthening or release in the stretched tissue. RANGE OF MOTION - Wrist Flexion, Active-Assisted  Extend your right / left elbow with your fingers pointing down.*  Gently pull the back of your hand towards you, until you feel a gentle stretch on the top of your forearm.  Hold this position for __________ seconds. Repeat __________ times. Complete this exercise __________ times per day.  *If directed by your physician, physical therapist or athletic trainer, complete this stretch with your elbow bent, rather than extended. RANGE OF MOTION - Wrist Extension, Active-Assisted  Extend your right / left elbow and turn your palm upwards.*  Gently pull your palm and fingertips back, so your wrist extends and your fingers point more toward the ground.  You should feel a gentle stretch on the inside of your forearm.  Hold this position for __________ seconds. Repeat __________ times. Complete this exercise __________ times per day. *If directed by your physician, physical therapist or athletic trainer, complete this stretch with your elbow bent, rather than extended. STRETCH - Wrist Extension   Place your right / left fingertips on a tabletop leaving your elbow slightly bent. Your fingers should point backwards.  Gently press your fingers and palm down onto the table, by straightening your elbow. You should feel a stretch on the inside of your forearm.  Hold this position for  __________ seconds. Repeat __________ times. Complete this stretch __________ times per day.  STRENGTHENING EXERCISES - Epicondylitis, Medial (Golfer's Elbow) These exercises may help you when beginning to rehabilitate your injury. They may resolve your symptoms with or without further involvement from your physician, physical therapist or athletic trainer. While completing these exercises, remember:   Muscles can gain both the endurance and the strength needed for everyday activities through controlled exercises.  Complete these exercises as instructed by your physician, physical therapist or athletic trainer. Increase the resistance and repetitions only as guided.  You may experience muscle soreness or fatigue, but the pain or discomfort you are trying to eliminate should never worsen during these exercises. If this pain does get worse, stop and make sure you are following the directions exactly. If the pain is still present after adjustments, discontinue the exercise until you can discuss the trouble with your caregiver. STRENGTH - Wrist Flexors  Sit with your right / left forearm palm-up, and fully supported on a table or countertop. Your elbow should be resting below the height of your shoulder. Allow your wrist to extend over the edge of the surface.  Loosely holding a __________ weight, or a piece of rubber exercise band or tubing, slowly curl your hand up toward your forearm.  Hold this position for __________ seconds. Slowly lower the wrist back to the starting position in a controlled manner. Repeat __________ times. Complete this exercise __________ times per day.  STRENGTH - Wrist Extensors  Sit with your right / left forearm palm-down and fully supported. Your elbow should be resting below the height of your shoulder. Allow your   wrist to extend over the edge of the surface.  Loosely holding a __________ weight, or a piece of rubber exercise band or tubing, slowly curl your hand up  toward your forearm.  Hold this position for __________ seconds. Slowly lower the wrist back to the starting position in a controlled manner. Repeat __________ times. Complete this exercise __________ times per day.  STRENGTH - Ulnar Deviators  Stand with a ____________________ weight in your right / left hand, or sit while holding a rubber exercise band or tubing, with your healthy arm supported on a table or countertop.  Move your wrist so that your pinkie travels toward your forearm and your thumb moves away from your forearm.  Hold this position for __________ seconds and then slowly lower the wrist back to the starting position. Repeat __________ times. Complete this exercise __________ times per day STRENGTH - Grip   Grasp a tennis ball, a dense sponge, or a large, rolled sock in your hand.  Squeeze as hard as you can, without increasing any pain.  Hold this position for __________ seconds. Release your grip slowly. Repeat __________ times. Complete this exercise __________ times per day.  STRENGTH - Forearm Supinators   Sit with your right / left forearm supported on a table, keeping your elbow below shoulder height. Rest your hand over the edge, palm down.  Gently grip a hammer or a soup ladle.  Without moving your elbow, slowly turn your palm and hand upward to a "thumbs-up" position.  Hold this position for __________ seconds. Slowly return to the starting position. Repeat __________ times. Complete this exercise __________ times per day.  STRENGTH - Forearm Pronators  Sit with your right / left forearm supported on a table, keeping your elbow below shoulder height. Rest your hand over the edge, palm up.  Gently grip a hammer or a soup ladle.  Without moving your elbow, slowly turn your palm and hand upward to a "thumbs-up" position.  Hold this position for __________ seconds. Slowly return to the starting position. Repeat __________ times. Complete this exercise  __________ times per day.    This information is not intended to replace advice given to you by your health care provider. Make sure you discuss any questions you have with your health care provider.   Document Released: 11/25/2005 Document Revised: 12/16/2014 Document Reviewed: 03/09/2009 Elsevier Interactive Patient Education 2016 [employer]  Tendinitis  Tendinitis is redness, soreness, and puffiness (inflammation) of the tendons. Tendons are band-like tissues that connect muscle to bone. Tendinitis often happens in the shoulders, heels, or elbows. It might happen if your job involves doing the same motions over and over. HOME CARE  Use a sling or splint as told by your doctor.  Put ice on the injured area.  Put ice in a plastic bag.  Place a towel between your skin and the bag.  Leave the ice on for 15-20 minutes, 03-04 times a day.  Avoid using your injured arm or leg until the pain goes away.  Do gentle exercises only as told by your doctor. Stop exercises if the pain gets worse, unless your doctor tells you otherwise.  Only take medicines as told by your doctor. GET HELP RIGHT AWAY IF:  Your pain and puffiness get worse.  You have new problems, such as loss of feeling (numbness) in the hands. MAKE SURE YOU:  Understand these instructions.  Will watch your condition.  Will get help right away if you are not doing well or  get worse.   This information is not intended to replace advice given to you by your health care provider. Make sure you discuss any questions you have with your health care provider.   Document Released: 03/07/2011 Document Revised: 02/17/2012 Document Reviewed: 03/07/2011 Elsevier Interactive Patient Education [employer].

## 2016-03-04 NOTE — ED Notes (Addendum)
Pt is here for continued pain in her right arm from a fall she had over three weeks ago.  Pt was originally seen here and then again in the ED.  Pt had imaging done on her left arm in the ED with no findings.  Pt states she was unable to fill the Rx she was given here because insurance didn't cover it and she was only given a three day supply of medication in the ED.  Pt is here for continued injury from her original fall.  She has reported to multiple people per the chart that the arm that was injured was the left, today she is reporting the same injury with improper treatment, but she is complaining of right arm pain.

## 2016-03-04 NOTE — ED Provider Notes (Signed)
CSN: [id number]     Arrival date & time 03/04/16  1309 History   First MD Initiated Contact with Patient 03/04/16 1446     Chief Complaint  Patient presents with  . Arm Pain   (Consider location/radiation/quality/duration/timing/severity/associated sxs/prior Treatment) HPI Comments: 45 year old female is complaining of right arm pain for 3 days. There is some confusion in her complaints in that she was seen in the urgent care on March 3 and then in the emergency department on March 21 for left shoulder and arm pain status post a fall. Most of her discussion was centered around the fall and left arm pain. It was difficult to get her on track regarding the right arm pain. It turns out that this pain was not associated with the fall and she is unaware of any known injury or causative event. While talking she is tearful, crying speaking angrily and moving both arms up and down, back and forth and waving them around. She demonstrates no limitations in range of motion.   Past Medical History  Diagnosis Date  . Depression   . Anxiety   . Asthma    Past Surgical History  Procedure Laterality Date  . Right ankle surgery      2010  . Tubal ligation    . Benign breast tumor removed      2006   Family History  Problem Relation Age of Onset  . Breast cancer Mother   . Cancer Mother   . Diabetes Mellitus II Father   . Diabetes Father    Social History  Substance Use Topics  . Smoking status: Current Every Day Smoker -- 0.00 packs/day    Types: Cigarettes  . Smokeless tobacco: Current User  . Alcohol Use: No   OB History    No data available     Review of Systems  Constitutional: Positive for activity change. Negative for fever and fatigue.  HENT: Negative.   Respiratory: Negative.   Cardiovascular: Negative.   Musculoskeletal: Positive for arthralgias. Negative for back pain and joint swelling.  Skin: Negative.   Neurological: Negative.   Psychiatric/Behavioral: Positive for  behavioral problems and agitation. The patient is nervous/anxious.     Allergies  Levaquin  Home Medications   Prior to Admission medications   Medication Sig Start Date End Date Taking? Authorizing Provider  cyclobenzaprine (FLEXERIL) 10 MG tablet Take 1 tablet (10 mg total) by mouth 3 (three) times daily as needed for muscle spasms. 02/27/16  Yes Sharlett Iles, MD  Menthol, Topical Analgesic, (ICY HOT EX) Apply 1 application topically daily as needed.   Yes Historical Provider, MD  acetaminophen (TYLENOL) 650 MG CR tablet Take 650 mg by mouth every 8 (eight) hours as needed for pain.    Historical Provider, MD  diclofenac (CATAFLAM) 50 MG tablet Take 1 tablet (50 mg total) by mouth 3 (three) times daily. For pain. 02/09/16   Billy Fischer, MD  ibuprofen (ADVIL,MOTRIN) 200 MG tablet Take 400 mg by mouth every 6 (six) hours as needed for moderate pain.    Historical Provider, MD  indomethacin (INDOCIN) 50 MG capsule Take 1 capsule (50 mg total) by mouth 2 (two) times daily with a meal. 03/04/16   Janne Napoleon, NP  loratadine (CLARITIN) 10 MG tablet Take 10 mg by mouth daily.    Historical Provider, MD   Meds Ordered and Administered this Visit  Medications - No data to display  BP 128/78 mmHg  Pulse 79  Temp(Src) 98.1 F (  36.7 C) (Oral)  SpO2 99% No data found.   Physical Exam  Constitutional: She appears well-developed and well-nourished. No distress.  Eyes: EOM are normal.  Neck: Normal range of motion. Neck supple.  Cardiovascular: Normal rate.   Pulmonary/Chest: Effort normal. No respiratory distress.  Musculoskeletal:  The pain of the right arm is actually located to the medial aspect of the elbow. There is pain radiates along the volar aspect of the forearm. The pain is constant but seems to be worse with grasping and use of the forearm. She is unable to briskly raise the arm, abducted over her head and briskly flex and extend the elbow and demonstrate normal supination and  pronation. Extension of the wrist against resistance produces mild pain to the volar aspect of the forearm. During the exam she is consistently moving the right arm in different directions and noncompliant with exam. She does have tenderness over the medial epicondyles LS tenderness to the proximal volar tendons. There is no swelling. No deformity, no discoloration. Wrist movement and digital movement and grasp is intact. She continues to cry and easily agitated.  Neurological: She is alert. No cranial nerve deficit. She exhibits normal muscle tone.  Skin: Skin is warm and dry. No rash noted. No erythema.  Psychiatric: Her mood appears anxious. Her affect is angry. Her speech is rapid and/or pressured. She is agitated, aggressive and hyperactive. She expresses impulsivity.  Nursing note and vitals reviewed.   ED Course  Procedures (including critical care time)  Labs Review Labs Reviewed - No data to display  Imaging Review No results found.   Visual Acuity Review  Right Eye Distance:   Left Eye Distance:   Bilateral Distance:    Right Eye Near:   Left Eye Near:    Bilateral Near:         MDM   1. Medial epicondylitis of elbow, left   2. Tendinitis of elbow or forearm    Meds ordered this encounter  Medications  . indomethacin (INDOCIN) 50 MG capsule    Sig: Take 1 capsule (50 mg total) by mouth 2 (two) times daily with a meal.    Dispense:  20 capsule    Refill:  0    Order Specific Question:  Supervising Provider    Answer:  Billy Fischer [id number]   Ice, limit use or right arm    Janne Napoleon, NP 03/04/16 1609

## 2016-03-05 ENCOUNTER — Ambulatory Visit: Payer: Medicaid Other | Attending: Internal Medicine | Admitting: Internal Medicine

## 2016-03-05 ENCOUNTER — Encounter: Payer: Self-pay | Admitting: Internal Medicine

## 2016-03-05 VITALS — BP 104/74 | HR 72 | Temp 98.0°F | Resp 16 | Wt 197.0 lb

## 2016-03-05 DIAGNOSIS — Z79899 Other long term (current) drug therapy: Secondary | ICD-10-CM | POA: Diagnosis not present

## 2016-03-05 DIAGNOSIS — N951 Menopausal and female climacteric states: Secondary | ICD-10-CM

## 2016-03-05 DIAGNOSIS — F329 Major depressive disorder, single episode, unspecified: Secondary | ICD-10-CM | POA: Insufficient documentation

## 2016-03-05 DIAGNOSIS — Z0001 Encounter for general adult medical examination with abnormal findings: Secondary | ICD-10-CM | POA: Diagnosis present

## 2016-03-05 DIAGNOSIS — M7701 Medial epicondylitis, right elbow: Secondary | ICD-10-CM | POA: Diagnosis not present

## 2016-03-05 DIAGNOSIS — J45909 Unspecified asthma, uncomplicated: Secondary | ICD-10-CM | POA: Insufficient documentation

## 2016-03-05 DIAGNOSIS — F419 Anxiety disorder, unspecified: Secondary | ICD-10-CM | POA: Insufficient documentation

## 2016-03-05 DIAGNOSIS — M1990 Unspecified osteoarthritis, unspecified site: Secondary | ICD-10-CM

## 2016-03-05 MED ORDER — IBUPROFEN 800 MG PO TABS: 800.0000 mg | ORAL_TABLET | Freq: Three times a day (TID) | ORAL | Status: DC | PRN

## 2016-03-05 NOTE — Progress Notes (Signed)
Patient here for follow up from the urgent care Was seen yesterday for left shoulder/arm pain and  Lower back pain Injured herself when she fell off her grandchilds hover board

## 2016-03-05 NOTE — Progress Notes (Signed)
Tina Mayo, is a 45 y.o. female  [id number]  [id number]  DOB - 1971-11-11  Chief Complaint  Patient presents with  . Follow-up        Subjective:   Tina Mayo is a 45 y.o. female here today for a follow up visit.  Recent seen in ED yesterday for left/right shoulder pains, recent fall back on 02/09/16 from hoverboard has made these aches worse.  Said diclofenac did not help, but friend gave her motrin 800mg  which took away all her pain.  Also c/o of hot flashes, and asked if hormone replacement would help.  Patient has No headache, No chest pain, No abdominal pain - No Nausea, No new weakness tingling or numbness, No Cough - SOB.  No problems updated.  ALLERGIES: Allergies  Allergen Reactions  . Levaquin [Levofloxacin In D5w] Anaphylaxis    PAST MEDICAL HISTORY: Past Medical History  Diagnosis Date  . Depression   . Anxiety   . Asthma     MEDICATIONS AT HOME: Prior to Admission medications   Medication Sig Start Date End Date Taking? Authorizing Provider  acetaminophen (TYLENOL) 650 MG CR tablet Take 650 mg by mouth every 8 (eight) hours as needed for pain.    Historical Provider, MD  cyclobenzaprine (FLEXERIL) 10 MG tablet Take 1 tablet (10 mg total) by mouth 3 (three) times daily as needed for muscle spasms. 02/27/16   Sharlett Iles, MD  ibuprofen (ADVIL,MOTRIN) 800 MG tablet Take 1 tablet (800 mg total) by mouth every 8 (eight) hours as needed (TAKE WITH FOOD). 03/05/16   Maren Reamer, MD  loratadine (CLARITIN) 10 MG tablet Take 10 mg by mouth daily.    Historical Provider, MD  Menthol, Topical Analgesic, (ICY HOT EX) Apply 1 application topically daily as needed.    Historical Provider, MD     Objective:   Filed Vitals:   03/05/16 1008  BP: 104/74  Pulse: 72  Temp: 98 F (36.7 C)  Resp: 16  Weight: 197 lb (89.359 kg)  SpO2: 100%    Exam General appearance : Awake, alert, not in any distress. Speech Clear. Not toxic looking,  pleasant. HEENT: Atraumatic and Normocephalic. Neck: supple, no JVD. No cervical lymphadenopathy.  Chest:Good air entry bilaterally, no added sounds. CVS: S1 S2 regular, no murmurs/gallups or rubs. Abdomen: Bowel sounds active, Non tender and not distended with no gaurding, rigidity or rebound. Extremities: B/L Lower Ext shows no edema, both legs are warm to touch.  Mild ttp right medial epicondyle, but no active inflammation/edema/swelling noted. Neurology: Awake alert, and oriented X 3, CN II-XII grossly intact, Non focal Skin:No Rash  Data Review No results found for: HGBA1C   02/09/16 pelvis xrays IMPRESSION: No acute fracture or dislocation identified.  Suspected patchy sclerosis within the femoral heads RIGHT greater than LEFT, unable to exclude avascular necrosis.  Does patient have risk factors for avascular necrosis?  This can be better assessed by followup non emergent MR imaging of the pelvis.  02/27/16 left humerus FINDINGS: No fracture dislocation of the LEFT humerus. There is sclerosis within the LEFT humeral head, unchanged from prior, suggesting remote trauma.  IMPRESSION: No acute fracture or dislocation.  3/21 left shoulder IMPRESSION: No acute fracture or subluxation. Degenerative changes glenohumeral joint. Mild spurring of humeral head. Patchy sclerosis humeral head suspicious for a vascular necrosis  Assessment & Plan   1. Arthritis - noted patchy slerosis on hip films. - rom exercises recd, warm heat tid prn    2.  Probable Epicondylitis elbow, medial, right, improving - rom exercises - did not tolerate diclofenac, but motrin 800 wks for her, prescribed, instructed to take w/ food.  3. Hot flash, menopausal - no hormonal therapy recd at this time - symptomatic relief, may trial natural estrogens/soy/black cohosh prn.     Patient have been counseled extensively about nutrition and exercise  Return in about 3 months (around  06/05/2016), or if symptoms worsen or fail to improve.    The patient was given clear instructions to go to ER or return to medical center if symptoms don't improve, worsen or new problems develop. The patient verbalized understanding. The patient was told to call to get lab results if they haven't heard anything in the next week.    Maren Reamer, MD, [place] [facility] [place], [place]   03/05/2016, 10:27 AM

## 2016-03-05 NOTE — Patient Instructions (Signed)
FOR HOT FLASHES, TRY NATURAL SOY AND BLACK COHOSH.  Shoulder Range of Motion Exercises Shoulder range of motion (ROM) exercises are designed to keep the shoulder moving freely. They are often recommended for people who have shoulder pain. MOVEMENT EXERCISE When you are able, do this exercise 5-6 days per week, or as told by your health care provider. Work toward doing 2 sets of 10 swings. Pendulum Exercise How To Do This Exercise Lying Down 1. Lie face-down on a bed with your abdomen close to the side of the bed. 2. Let your arm hang over the side of the bed. 3. Relax your shoulder, arm, and hand. 4. Slowly and gently swing your arm forward and back. Do not use your neck muscles to swing your arm. They should be relaxed. If you are struggling to swing your arm, have someone gently swing it for you. When you do this exercise for the first time, swing your arm at a 15 degree angle for 15 seconds, or swing your arm 10 times. As pain lessens over time, increase the angle of the swing to 30-45 degrees. 5. Repeat steps 1-4 with the other arm. How To Do This Exercise While Standing 1. Stand next to a sturdy chair or table and hold on to it with your hand.  Bend forward at the waist.  Bend your knees slightly.  Relax your other arm and let it hang limp.  Relax the shoulder blade of the arm that is hanging and let it drop.  While keeping your shoulder relaxed, use body motion to swing your arm in small circles. The first time you do this exercise, swing your arm for about 30 seconds or 10 times. When you do it next time, swing your arm for a little longer.  Stand up tall and relax.  Repeat steps 1-7, this time changing the direction of the circles. 2. Repeat steps 1-8 with the other arm. STRETCHING EXERCISES Do these exercises 3-4 times per day on 5-6 days per week or as told by your health care provider. Work toward holding the stretch for 20 seconds. Stretching Exercise 1 1. Lift your arm  straight out in front of you. 2. Bend your arm 90 degrees at the elbow (right angle) so your forearm goes across your body and looks like the letter "L." 3. Use your other arm to gently pull the elbow forward and across your body. 4. Repeat steps 1-3 with the other arm. Stretching Exercise 2 You will need a towel or rope for this exercise. 1. Bend one arm behind your back with the palm facing outward. 2. Hold a towel with your other hand. 3. Reach the arm that holds the towel above your head, and bend that arm at the elbow. Your wrist should be behind your neck. 4. Use your free hand to grab the free end of the towel. 5. With the higher hand, gently pull the towel up behind you. 6. With the lower hand, pull the towel down behind you. 7. Repeat steps 1-6 with the other arm. STRENGTHENING EXERCISES Do each of these exercises at four different times of day (sessions) every day or as told by your health care provider. To begin with, repeat each exercise 5 times (repetitions). Work toward doing 3 sets of 12 repetitions or as told by your health care provider. Strengthening Exercise 1 You will need a light weight for this activity. As you grow stronger, you may use a heavier weight. 1. Standing with a weight in  your hand, lift your arm straight out to the side until it is at the same height as your shoulder. 2. Bend your arm at 90 degrees so that your fingers are pointing to the ceiling. 3. Slowly raise your hand until your arm is straight up in the air. 4. Repeat steps 1-3 with the other arm. Strengthening Exercise 2 You will need a light weight for this activity. As you grow stronger, you may use a heavier weight. 1. Standing with a weight in your hand, gradually move your straight arm in an arc, starting at your side, then out in front of you, then straight up over your head. 2. Gradually move your other arm in an arc, starting at your side, then out in front of you, then straight up over your  head. 3. Repeat steps 1-2 with the other arm. Strengthening Exercise 3 You will need an elastic band for this activity. As you grow stronger, gradually increase the size of the bands or increase the number of bands that you use at one time. 1. While standing, hold an elastic band in one hand and raise that arm up in the air. 2. With your other hand, pull down the band until that hand is by your side. 3. Repeat steps 1-2 with the other arm.   This information is not intended to replace advice given to you by your health care provider. Make sure you discuss any questions you have with your health care provider.   Document Released: 08/24/2003 Document Revised: 04/11/2015 Document Reviewed: 11/21/2014 Elsevier Interactive Patient Education [employer].

## 2016-03-06 ENCOUNTER — Other Ambulatory Visit: Payer: Self-pay | Admitting: Internal Medicine

## 2016-03-08 ENCOUNTER — Other Ambulatory Visit: Payer: Medicaid Other

## 2016-03-08 NOTE — Progress Notes (Signed)
This encounter was created in error - please disregard.

## 2016-03-10 ENCOUNTER — Encounter (HOSPITAL_COMMUNITY): Payer: Self-pay | Admitting: Emergency Medicine

## 2016-03-10 ENCOUNTER — Emergency Department (HOSPITAL_COMMUNITY)
Admission: EM | Admit: 2016-03-10 | Discharge: 2016-03-10 | Disposition: A | Discharge status: Home / Self Care | Payer: Medicaid Other | Attending: Emergency Medicine | Admitting: Emergency Medicine

## 2016-03-10 DIAGNOSIS — J45909 Unspecified asthma, uncomplicated: Secondary | ICD-10-CM | POA: Insufficient documentation

## 2016-03-10 DIAGNOSIS — R21 Rash and other nonspecific skin eruption: Secondary | ICD-10-CM | POA: Insufficient documentation

## 2016-03-10 DIAGNOSIS — Z8659 Personal history of other mental and behavioral disorders: Secondary | ICD-10-CM | POA: Insufficient documentation

## 2016-03-10 DIAGNOSIS — Z79899 Other long term (current) drug therapy: Secondary | ICD-10-CM | POA: Diagnosis not present

## 2016-03-10 MED ORDER — KETOROLAC TROMETHAMINE 30 MG/ML IJ SOLN
30.0000 mg | Freq: Once | INTRAMUSCULAR | Status: AC
  Administered 2016-03-10: 30 mg via INTRAMUSCULAR
  Filled 2016-03-10: qty 1

## 2016-03-10 NOTE — ED Notes (Signed)
States I think I got bit by a spider because I have spiders.  3 small marks noted to right upper arm.  Reporting excruciating pain.  Took ibuprofen at home when it happened took another after 6pm 800mg .  Very fidgety in triage.

## 2016-03-10 NOTE — ED Provider Notes (Signed)
CSN: [id number]     Arrival date & time 03/10/16  2011 History  By signing my name below, I, Randa Evens, attest that this documentation has been prepared under the direction and in the presence of [employer], PA-C. Electronically Signed: Randa Evens, ED Scribe. 03/10/2016. 8:54 PM.     Chief Complaint  Patient presents with  . Insect Bite   The history is provided by the patient. No language interpreter was used.   HPI Comments: Tina Mayo is a 45 y.o. female who presents to the Emergency Department complaining of insect bite to her right upper arm onset today at 2 PM. Pt states the severity of her pain is worse than 10/10 and radiating down her arm. Pt states she does have spiders in her house but she did not see anything bite her. Pt states she as tried motrin and muscle relaxants with no relief. Pt states she tried cleaning the area with peroxide and alcohol. Pt states she has applied ice PTA with no relief. Pt denies numbness or any other symptoms.   Past Medical History  Diagnosis Date  . Depression   . Anxiety   . Asthma    Past Surgical History  Procedure Laterality Date  . Right ankle surgery      2010  . Tubal ligation    . Benign breast tumor removed      2006   Family History  Problem Relation Age of Onset  . Breast cancer Mother   . Cancer Mother   . Diabetes Mellitus II Father   . Diabetes Father    Social History  Substance Use Topics  . Smoking status: Current Every Day Smoker -- 0.00 packs/day    Types: Cigarettes  . Smokeless tobacco: Current User  . Alcohol Use: No   OB History    No data available      Review of Systems  All other systems reviewed and are negative.    Allergies  Levaquin  Home Medications   Prior to Admission medications   Medication Sig Start Date End Date Taking? Authorizing Provider  acetaminophen (TYLENOL) 650 MG CR tablet Take 650 mg by mouth every 8 (eight) hours as needed for pain.    Historical  Provider, MD  cyclobenzaprine (FLEXERIL) 10 MG tablet Take 1 tablet (10 mg total) by mouth 3 (three) times daily as needed for muscle spasms. 02/27/16   Sharlett Iles, MD  ibuprofen (ADVIL,MOTRIN) 800 MG tablet Take 1 tablet (800 mg total) by mouth every 8 (eight) hours as needed (TAKE WITH FOOD). 03/05/16   Maren Reamer, MD  loratadine (CLARITIN) 10 MG tablet Take 10 mg by mouth daily.    Historical Provider, MD  Menthol, Topical Analgesic, (ICY HOT EX) Apply 1 application topically daily as needed.    Historical Provider, MD   BP 134/88 mmHg  Pulse 98  Temp(Src) 98.7 F (37.1 C) (Oral)  Resp 18  Ht 5\' 7"  (1.702 m)  Wt 88.905 kg  BMI 30.69 kg/m2  SpO2 98%   Physical Exam  Constitutional: She is oriented to person, place, and time. She appears well-developed and well-nourished. No distress.  HENT:  Head: Normocephalic and atraumatic.  Eyes: Conjunctivae and EOM are normal.  Neck: Neck supple. No tracheal deviation present.  Cardiovascular: Normal rate.   Pulmonary/Chest: Effort normal. No respiratory distress.  Musculoskeletal: Normal range of motion.  Full active range of motion of the shoulder, nontender to palpation, radial pulses 2+, Reflux and 3 seconds.  No signs of surrounding infection to noted rash on photo.  Neurological: She is alert and oriented to person, place, and time.  Skin: Skin is warm and dry.  Psychiatric: She has a normal mood and affect. Her behavior is normal.  Nursing note and vitals reviewed.      ED Course  Procedures (including critical care time) DIAGNOSTIC STUDIES: Oxygen Saturation is 98% on RA, normal by my interpretation.    COORDINATION OF CARE: 9:33 PM-Discussed treatment plan with pt at bedside and pt agreed to plan.    Labs Review Labs Reviewed - No data to display  Imaging Review No results found.    EKG Interpretation None      MDM   Final diagnoses:  Rash  Labs:  Imaging:  Consults:  Therapeutics:  Toradol  Discharge Meds:   Assessment/Plan: Patient presents with a rash to her right arm. She reports this is 10 out of 10 pain. Uncertain what this rash is, could likely be an insect bite, she has no signs of infection. This does not appear to be shingles, but it remains in my differential. Patient was given shot of Toradol here, instructed to monitor for worsening signs or symptoms, follow-up with primary care tomorrow for reevaluation. She verbalizes understanding and agreement today's plan.      I personally performed the services described in this documentation, which was scribed in my presence. The recorded information has been reviewed and is accurate.      Okey Regal, PA-C 03/10/16 2228  Pattricia Boss, MD 03/11/16 [phone]

## 2016-03-18 ENCOUNTER — Emergency Department (HOSPITAL_COMMUNITY)
Admission: EM | Admit: 2016-03-18 | Discharge: 2016-03-19 | Disposition: A | Discharge status: Home / Self Care | Payer: Medicaid Other | Attending: Emergency Medicine | Admitting: Emergency Medicine

## 2016-03-18 ENCOUNTER — Emergency Department (HOSPITAL_COMMUNITY): Payer: Medicaid Other

## 2016-03-18 ENCOUNTER — Encounter (HOSPITAL_COMMUNITY): Payer: Self-pay | Admitting: Emergency Medicine

## 2016-03-18 DIAGNOSIS — J45909 Unspecified asthma, uncomplicated: Secondary | ICD-10-CM | POA: Insufficient documentation

## 2016-03-18 DIAGNOSIS — R079 Chest pain, unspecified: Secondary | ICD-10-CM | POA: Insufficient documentation

## 2016-03-18 DIAGNOSIS — F17210 Nicotine dependence, cigarettes, uncomplicated: Secondary | ICD-10-CM | POA: Insufficient documentation

## 2016-03-18 LAB — BASIC METABOLIC PANEL
Anion gap: 7 (ref 5–15)
BUN: 6 mg/dL (ref 6–20)
CALCIUM: 9.7 mg/dL (ref 8.9–10.3)
CHLORIDE: 109 mmol/L (ref 101–111)
CO2: 25 mmol/L (ref 22–32)
CREATININE: 0.73 mg/dL (ref 0.44–1.00)
GFR calc non Af Amer: >60 mL/min (ref >60)
GLUCOSE: 84 mg/dL (ref 65–99)
Potassium: 4.4 mmol/L (ref 3.5–5.1)
Sodium: 141 mmol/L (ref 135–145)

## 2016-03-18 LAB — CBC
HCT: 30 % — ABNORMAL LOW (ref 36.0–46.0)
HEMOGLOBIN: 10.7 g/dL — AB (ref 12.0–15.0)
MCH: 28.5 pg (ref 26.0–34.0)
MCHC: 35.7 g/dL (ref 30.0–36.0)
MCV: 79.8 fL (ref 78.0–100.0)
PLATELETS: 92 10*3/uL — AB (ref 150–400)
RBC: 3.76 MIL/uL — AB (ref 3.87–5.11)
RDW: 15.9 % — ABNORMAL HIGH (ref 11.5–15.5)
WBC: 9.5 10*3/uL (ref 4.0–10.5)

## 2016-03-18 LAB — I-STAT TROPONIN, ED: TROPONIN I, POC: 0 ng/mL (ref 0.00–0.08)

## 2016-03-18 NOTE — ED Notes (Signed)
No answer x2 

## 2016-03-18 NOTE — ED Notes (Signed)
Pt grabbing chest and states she feels like she is having asthma attack and chest pain. Pt states she feels like her chest will "blow up." Pain started around noon. Pt states she took tums with no relief.

## 2016-03-18 NOTE — ED Notes (Signed)
No answer x3

## 2016-08-12 ENCOUNTER — Encounter (HOSPITAL_COMMUNITY): Payer: Self-pay | Admitting: *Deleted

## 2016-08-12 ENCOUNTER — Ambulatory Visit (HOSPITAL_COMMUNITY)
Admission: EM | Admit: 2016-08-12 | Discharge: 2016-08-12 | Disposition: A | Discharge status: Home / Self Care | Payer: Medicaid Other | Attending: Family Medicine | Admitting: Family Medicine

## 2016-08-12 DIAGNOSIS — T148XXA Other injury of unspecified body region, initial encounter: Secondary | ICD-10-CM

## 2016-08-12 DIAGNOSIS — W1809XA Striking against other object with subsequent fall, initial encounter: Secondary | ICD-10-CM | POA: Diagnosis not present

## 2016-08-12 DIAGNOSIS — M791 Myalgia, unspecified site: Secondary | ICD-10-CM

## 2016-08-12 DIAGNOSIS — T148 Other injury of unspecified body region: Secondary | ICD-10-CM

## 2016-08-12 DIAGNOSIS — M79604 Pain in right leg: Secondary | ICD-10-CM

## 2016-08-12 MED ORDER — TRAMADOL HCL 50 MG PO TABS: 50.0000 mg | ORAL_TABLET | Freq: Four times a day (QID) | ORAL | 0 refills | Status: DC | PRN

## 2016-08-12 MED ORDER — DICLOFENAC POTASSIUM 50 MG PO TABS: 50.0000 mg | ORAL_TABLET | Freq: Three times a day (TID) | ORAL | 0 refills | Status: DC

## 2016-08-12 NOTE — ED Provider Notes (Signed)
CSN: [id number]     Arrival date & time 08/12/16  1246 History   First MD Initiated Contact with Patient 08/12/16 1541     Chief Complaint  Patient presents with  . Leg Pain   (Consider location/radiation/quality/duration/timing/severity/associated sxs/prior Treatment) 45 year old female complaining of left leg pain for 3 days. She was sending from a stepladder with a can of pain in her hand, slipped and fell striking the lateral aspect of the right hip and thigh against the ladder. She did not fall to the ground. She is complaining of pain to the lateral hip and thigh musculature. She is able to bear weight. Certain positions such as standing increases pain and position such as sitting and with her legs and hips flexed against her helps relieve the pain.      Past Medical History:  Diagnosis Date  . Anxiety   . Asthma   . Depression    Past Surgical History:  Procedure Laterality Date  . benign breast tumor removed     2006  . right ankle surgery     2010  . TUBAL LIGATION     Family History  Problem Relation Age of Onset  . Breast cancer Mother   . Cancer Mother   . Diabetes Mellitus II Father   . Diabetes Father    Social History  Substance Use Topics  . Smoking status: Current Every Day Smoker    Packs/day: 0.00    Types: Cigarettes  . Smokeless tobacco: Current User  . Alcohol use No   OB History    No data available     Review of Systems  Constitutional: Negative.   HENT: Negative.   Respiratory: Negative.   Musculoskeletal: Positive for myalgias. Negative for back pain, joint swelling and neck pain.  Skin: Negative.  Negative for color change.  Neurological: Negative.   All other systems reviewed and are negative.   Allergies  Levaquin [levofloxacin in d5w]  Home Medications   Prior to Admission medications   Medication Sig Start Date End Date Taking? Authorizing Provider  acetaminophen (TYLENOL) 650 MG CR tablet Take 650 mg by mouth every 8  (eight) hours as needed for pain.    Historical Provider, MD  cyclobenzaprine (FLEXERIL) 10 MG tablet Take 1 tablet (10 mg total) by mouth 3 (three) times daily as needed for muscle spasms. 02/27/16   Sharlett Iles, MD  diclofenac (CATAFLAM) 50 MG tablet Take 1 tablet (50 mg total) by mouth 3 (three) times daily. One tablet TID with food prn pain. 08/12/16   Janne Napoleon, NP  loratadine (CLARITIN) 10 MG tablet Take 10 mg by mouth daily.    Historical Provider, MD  Menthol, Topical Analgesic, (ICY HOT EX) Apply 1 application topically daily as needed.    Historical Provider, MD  traMADol (ULTRAM) 50 MG tablet Take 1 tablet (50 mg total) by mouth every 6 (six) hours as needed. 08/12/16   Janne Napoleon, NP   Meds Ordered and Administered this Visit  Medications - No data to display  BP 100/65 (BP Location: Left Arm)   Pulse (!) 16   Temp 98.6 F (37 C) (Oral)   SpO2 100%  No data found.   Physical Exam  Constitutional: She is oriented to person, place, and time. She appears well-developed and well-nourished. No distress.  HENT:  Head: Normocephalic and atraumatic.  Neck: Neck supple.  Cardiovascular: Normal rate.   Pulmonary/Chest: Effort normal.  Musculoskeletal: Normal range of motion. She exhibits tenderness. She  exhibits no edema or deformity.  Patient is able to stand and bear weight but with pain. She prefers to place most weight to the right, unaffected leg. The left lateral hip and thigh is without discoloration or obvious swelling. No deformity. Deep palpation reproduces tenderness. Patient is able to get onto the table and off without assistance. Once on the table she flexes both legs and knees close to her body for relief of pain. While sitting hard Against the knee producing a longitudinal force against the femur does not produce hip or thigh pain.  Neurological: She is alert and oriented to person, place, and time.  Skin: Skin is warm and dry.  Psychiatric: She has a normal mood  and affect.  Nursing note and vitals reviewed.   Urgent Care Course   Clinical Course    Procedures (including critical care time)  Labs Review Labs Reviewed - No data to display  Imaging Review No results found.   Visual Acuity Review  Right Eye Distance:   Left Eye Distance:   Bilateral Distance:    Right Eye Near:   Left Eye Near:    Bilateral Near:         MDM   1. Right leg pain   2. Fall against object, initial encounter   3. Muscle contusion   4. Pain in the muscles    Ice for another 2 days, then heat and stretches. Limit ambulation for 2-3 days.  Meds ordered this encounter  Medications  . diclofenac (CATAFLAM) 50 MG tablet    Sig: Take 1 tablet (50 mg total) by mouth 3 (three) times daily. One tablet TID with food prn pain.    Dispense:  21 tablet    Refill:  0    Order Specific Question:   Supervising Provider    Answer:   Billy Fischer [phone]  . traMADol (ULTRAM) 50 MG tablet    Sig: Take 1 tablet (50 mg total) by mouth every 6 (six) hours as needed.    Dispense:  15 tablet    Refill:  0    Order Specific Question:   Supervising Provider    Answer:   Billy Fischer [[id number]]       Janne Napoleon, NP 08/12/16 1600

## 2016-08-12 NOTE — ED Triage Notes (Signed)
Pt  Reports    Symptoms   Of  Injury  To  l  Arm    She reports      She   Felled    3  Days  Ago   She  Reports   Symptoms  Not  releived  By otc  meds /  Remedies      Pt ambulated  To  Room  With a  Slow   Limping  Gain  She  Reports  Pain on  Weight  Bearing

## 2016-08-12 NOTE — Discharge Instructions (Signed)
Ice for another 2 days, then heat and stretches. Limit ambulation for 2-3 days.

## 2016-10-05 ENCOUNTER — Emergency Department (HOSPITAL_COMMUNITY)
Admission: EM | Admit: 2016-10-05 | Discharge: 2016-10-05 | Disposition: A | Discharge status: Home / Self Care | Payer: Medicaid Other | Attending: Emergency Medicine | Admitting: Emergency Medicine

## 2016-10-05 ENCOUNTER — Encounter (HOSPITAL_COMMUNITY): Payer: Self-pay | Admitting: *Deleted

## 2016-10-05 DIAGNOSIS — M791 Myalgia: Secondary | ICD-10-CM | POA: Diagnosis not present

## 2016-10-05 DIAGNOSIS — F17210 Nicotine dependence, cigarettes, uncomplicated: Secondary | ICD-10-CM | POA: Insufficient documentation

## 2016-10-05 DIAGNOSIS — R52 Pain, unspecified: Secondary | ICD-10-CM | POA: Diagnosis present

## 2016-10-05 DIAGNOSIS — J45909 Unspecified asthma, uncomplicated: Secondary | ICD-10-CM | POA: Diagnosis not present

## 2016-10-05 DIAGNOSIS — M7918 Myalgia, other site: Secondary | ICD-10-CM

## 2016-10-05 HISTORY — DX: Bell's palsy: G51.0

## 2016-10-05 HISTORY — DX: Sickle-cell trait: D57.3

## 2016-10-05 NOTE — Discharge Instructions (Signed)
Please follow-up with orthopedic specialist for further evaluation or management. Please see your primary care provider for further discussion of menopause symptoms. Return to the emergency room immediately. Experience any signs or symptoms.

## 2016-10-05 NOTE — ED Triage Notes (Signed)
Patient is here due to having body aches for the past 3 days.  She also reports she had onset of headache today when driving to the hospital.  Patient has noted facial weakness on the right, old sx.  She has hx of bells palsy.  Patient denies chest pain.   She states she is seen by [facility].  She has tried tylenol and ibuprofen w/o relief of pain.  No meds today.  Patient is alert and oriented.  She was ambulatory upon arrival

## 2016-10-05 NOTE — ED Provider Notes (Signed)
[facility] DEPT Provider Note   CSN: [id number] Arrival date & time: 10/05/16  1122    History   Chief Complaint Chief Complaint  Patient presents with  . Generalized Body Aches  . Weight Gain    states she has gained in her mid section  . Headache    HPI Tina Mayo is a 45 y.o. female.  HPI   Cho female presents today with complaints of chronic pain. Patient reports that for the last several years she's been dealing with intermittent severe "bone pain". She reports today that her shoulders are bothering her, describes it as severe, not improved with Tylenol or ibuprofen at home. She reports these episodes happen often, and often resolve. She denies any infectious etiology, reports she's been seen numerous times for this and has been told that her bones are "deteriorating". Patient also notes that she is having hot flashes and going through menopause with waking. Patient denies any fevers at home, reports that she has been using Tiger balm, icy hot and muscle relaxers which do not seem to improve her symptoms. Patient is not seen orthopedic specialist for this. Patient is currently being followed and managed by her primary care for menopausal symptoms.   Patient is a last menstrual cycle was 5 months ago, reports that she has her tubes tied.    Past Medical History:  Diagnosis Date  . Anxiety   . Asthma   . Bell's palsy   . Depression   . Sickle cell trait [facility])     Patient Active Problem List   Diagnosis Date Noted  . Trichimoniasis 05/03/2015  . Pap smear for cervical cancer screening 05/02/2015  . Perimenopausal 05/02/2015  . Vaginal discharge 05/02/2015  . Bell's palsy 02/28/2015  . Insomnia 12/26/2013  . Cystic breast 12/26/2013  . Tobacco abuse 09/21/2013  . Chest pain 08/26/2013  . Thrombocytopenia, unspecified 08/26/2013  . Cardiomegaly 08/26/2013  . Sickle cell trait ([place]) 08/26/2013  . Abnormal CT of the chest 08/26/2013  . Depression  08/26/2013  . Anemia, unspecified 08/26/2013    Past Surgical History:  Procedure Laterality Date  . benign breast tumor removed     2006  . right ankle surgery     2010  . TUBAL LIGATION      OB History    No data available       Home Medications    Prior to Admission medications   Medication Sig Start Date End Date Taking? Authorizing Provider  acetaminophen (TYLENOL) 500 MG tablet Take 1,000 mg by mouth every 6 (six) hours as needed for moderate pain or headache.   Yes Historical Provider, MD  diphenhydramine-acetaminophen (TYLENOL PM) 25-500 MG TABS tablet Take 1 tablet by mouth as needed (for pain).   Yes Historical Provider, MD  ibuprofen (ADVIL,MOTRIN) 200 MG tablet Take 600 mg by mouth every 6 (six) hours as needed for headache or moderate pain.   Yes Historical Provider, MD  cyclobenzaprine (FLEXERIL) 10 MG tablet Take 1 tablet (10 mg total) by mouth 3 (three) times daily as needed for muscle spasms. Patient not taking: Reported on 10/05/2016 02/27/16   Sharlett Iles, MD  diclofenac (CATAFLAM) 50 MG tablet Take 1 tablet (50 mg total) by mouth 3 (three) times daily. One tablet TID with food prn pain. Patient not taking: Reported on 10/05/2016 08/12/16   Janne Napoleon, NP  traMADol (ULTRAM) 50 MG tablet Take 1 tablet (50 mg total) by mouth every 6 (six) hours as needed. Patient not  taking: Reported on 10/05/2016 08/12/16   Janne Napoleon, NP    Family History Family History  Problem Relation Age of Onset  . Breast cancer Mother   . Cancer Mother   . Diabetes Mellitus II Father   . Diabetes Father     Social History Social History  Substance Use Topics  . Smoking status: Current Every Day Smoker    Packs/day: 0.00    Types: Cigarettes  . Smokeless tobacco: Current User  . Alcohol use No     Allergies   Levaquin [levofloxacin in d5w]   Review of Systems Review of Systems  All other systems reviewed and are negative.   Physical Exam Updated Vital  Signs BP 105/81   Pulse 92   Temp 98.5 F (36.9 C) (Oral)   Resp 22   SpO2 99%   Physical Exam  Constitutional: She is oriented to person, place, and time. She appears well-developed and well-nourished.  HENT:  Head: Normocephalic and atraumatic.  Eyes: Conjunctivae are normal. Pupils are equal, round, and reactive to light. Right eye exhibits no discharge. Left eye exhibits no discharge. No scleral icterus.  Neck: Normal range of motion. No JVD present. No tracheal deviation present.  Pulmonary/Chest: Effort normal. No stridor.  Abdominal: She exhibits no distension. There is no tenderness.  Musculoskeletal:  Tenderness to palpation of the right shoulder, no obvious swelling or deformity. No warmth to touch, full active range of motion. Distal grip strength 5 out of 5, sensation intact.  Neurological: She is alert and oriented to person, place, and time. Coordination normal.  Psychiatric: She has a normal mood and affect. Her behavior is normal. Judgment and thought content normal.  Nursing note and vitals reviewed.    ED Treatments / Results  Labs (all labs ordered are listed, but only abnormal results are displayed) Labs Reviewed - No data to display  EKG  EKG Interpretation None       Radiology No results found.  Procedures Procedures (including critical care time)  Medications Ordered in ED Medications - No data to display   Initial Impression / Assessment and Plan / ED Course  I have reviewed the triage vital signs and the nursing notes.  Pertinent labs & imaging results that were available during my care of the patient were reviewed by me and considered in my medical decision making (see chart for details).  Clinical Course      Final Clinical Impressions(s) / ED Diagnoses   Final diagnoses:  Musculoskeletal pain   Labs:  Imaging:  Consults:  Therapeutics:  Discharge Meds:   Assessment/Plan:  45 year old female presents today with  musculoskeletal pain. 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 Patient has no signs of infectious etiology here today, she will need orthopedic referral for ongoing chronic pain. Patient is instructed to continue following with primary care for menopause related symptoms. Patient has no signs of infectious etiology here, she has noted further complaints that would require further evaluation or management here in the ED setting. Strict precautions given. Patient verbalized understanding and agreement to today's plan.     New Prescriptions Discharge Medication List as of 10/05/2016 12:40 PM       Okey Regal, PA-C 10/05/16 1354    Veryl Speak, MD 10/05/16 1400

## 2016-10-05 NOTE — ED Notes (Signed)
PA Hedges at bedside  

## 2016-10-05 NOTE — ED Notes (Signed)
Noted right eye twitching and small right sided lip asymmetry.  Pt CC is shoulder and knee pain. Pt was told by one doctor that she "had deteriorating bones" Pt is complaining of "belly bloating from menopausal". Pt states she doesn't have sickle cell just sickle cell trait but states she has been treated for sickle cell symptoms.

## 2016-12-23 ENCOUNTER — Ambulatory Visit (HOSPITAL_COMMUNITY)
Admission: EM | Admit: 2016-12-23 | Discharge: 2016-12-23 | Disposition: A | Discharge status: Home / Self Care | Payer: Medicaid Other | Attending: Family Medicine | Admitting: Family Medicine

## 2016-12-23 ENCOUNTER — Encounter (HOSPITAL_COMMUNITY): Payer: Self-pay | Admitting: *Deleted

## 2016-12-23 DIAGNOSIS — B308 Other viral conjunctivitis: Secondary | ICD-10-CM | POA: Diagnosis not present

## 2016-12-23 MED ORDER — POLYMYXIN B-TRIMETHOPRIM 10000-0.1 UNIT/ML-% OP SOLN: 2.0000 [drp] | OPHTHALMIC | 0 refills | Status: DC

## 2016-12-23 NOTE — ED Triage Notes (Signed)
Woke    Up  This          Am   With       The  r  Eye        Matted          Shut      The  Eye  Is  Red   And  Irritated        And  Feels   Gritty

## 2016-12-23 NOTE — ED Provider Notes (Signed)
CSN: [id number]     Arrival date & time 12/23/16  1816 History   None    Chief Complaint  Patient presents with  . Eye Problem   (Consider location/radiation/quality/duration/timing/severity/associated sxs/prior Treatment) C/o right eye redness and swelling and tearing starting today.     The history is provided by the patient.  Eye Problem  Location:  Right eye Quality:  Burning Severity:  Moderate Onset quality:  Sudden Duration:  2 days Timing:  Constant Progression:  Worsening Chronicity:  New Relieved by:  None tried Worsened by:  Nothing Ineffective treatments:  None tried Associated symptoms: discharge, itching and redness     Past Medical History:  Diagnosis Date  . Anxiety   . Asthma   . Bell's palsy   . Depression   . Sickle cell trait [facility])    Past Surgical History:  Procedure Laterality Date  . benign breast tumor removed     2006  . right ankle surgery     2010  . TUBAL LIGATION     Family History  Problem Relation Age of Onset  . Breast cancer Mother   . Cancer Mother   . Diabetes Mellitus II Father   . Diabetes Father    Social History  Substance Use Topics  . Smoking status: Current Every Day Smoker    Packs/day: 0.00    Types: Cigarettes  . Smokeless tobacco: Current User  . Alcohol use No   OB History    No data available     Review of Systems  Constitutional: Negative.   HENT: Negative.   Eyes: Positive for discharge, redness and itching.  Respiratory: Negative.   Cardiovascular: Negative.   Gastrointestinal: Negative.   Endocrine: Negative.   Genitourinary: Negative.   Allergic/Immunologic: Negative.   Neurological: Negative.   Hematological: Negative.     Allergies  Levaquin [levofloxacin in d5w]  Home Medications   Prior to Admission medications   Medication Sig Start Date End Date Taking? Authorizing Provider  acetaminophen (TYLENOL) 500 MG tablet Take 1,000 mg by mouth every 6 (six) hours as needed for moderate  pain or headache.    Historical Provider, MD  cyclobenzaprine (FLEXERIL) 10 MG tablet Take 1 tablet (10 mg total) by mouth 3 (three) times daily as needed for muscle spasms. Patient not taking: Reported on 10/05/2016 02/27/16   Sharlett Iles, MD  diclofenac (CATAFLAM) 50 MG tablet Take 1 tablet (50 mg total) by mouth 3 (three) times daily. One tablet TID with food prn pain. Patient not taking: Reported on 10/05/2016 08/12/16   Janne Napoleon, NP  diphenhydramine-acetaminophen (TYLENOL PM) 25-500 MG TABS tablet Take 1 tablet by mouth as needed (for pain).    Historical Provider, MD  ibuprofen (ADVIL,MOTRIN) 200 MG tablet Take 600 mg by mouth every 6 (six) hours as needed for headache or moderate pain.    Historical Provider, MD  traMADol (ULTRAM) 50 MG tablet Take 1 tablet (50 mg total) by mouth every 6 (six) hours as needed. Patient not taking: Reported on 10/05/2016 08/12/16   Janne Napoleon, NP  trimethoprim-polymyxin b (POLYTRIM) ophthalmic solution Place 2 drops into the right eye every 4 (four) hours. 12/23/16   Lysbeth Penner, FNP   Meds Ordered and Administered this Visit  Medications - No data to display  BP 114/78 (BP Location: Right Arm)   Pulse 96   Temp 98.6 F (37 C) (Oral)   Resp 18   SpO2 100%  No data found.   Physical  Exam  Constitutional: She appears well-developed and well-nourished.  HENT:  Head: Normocephalic and atraumatic.  Right Ear: External ear normal.  Left Ear: External ear normal.  Nose: Nose normal.  Mouth/Throat: Oropharynx is clear and moist.  Eyes: EOM are normal. Pupils are equal, round, and reactive to light.  Right conjunctiva injected and sclera  Neck: Normal range of motion. Neck supple.  Cardiovascular: Normal rate, regular rhythm and normal heart sounds.   Pulmonary/Chest: Effort normal and breath sounds normal.  Nursing note and vitals reviewed.   Urgent Care Course   Clinical Course     Procedures (including critical care time)  Labs  Review Labs Reviewed - No data to display  Imaging Review No results found.   Visual Acuity Review  Right Eye Distance:   Left Eye Distance:   Bilateral Distance:    Right Eye Near:   Left Eye Near:    Bilateral Near:         MDM   1. Chronic viral conjunctivitis of right eye    Polytrim eye drops 2 gtt's OD qid #10 ml      Lysbeth Penner, FNP 12/23/16 2017    Lysbeth Penner, FNP 12/23/16 2042

## 2016-12-27 ENCOUNTER — Other Ambulatory Visit: Payer: Self-pay | Admitting: Internal Medicine

## 2016-12-27 DIAGNOSIS — N63 Unspecified lump in unspecified breast: Secondary | ICD-10-CM

## 2017-01-16 ENCOUNTER — Other Ambulatory Visit: Payer: Medicaid Other

## 2017-01-16 ENCOUNTER — Ambulatory Visit
Admission: RE | Admit: 2017-01-16 | Discharge: 2017-01-16 | Disposition: A | Discharge status: Home / Self Care | Payer: Medicaid Other | Source: Ambulatory Visit | Attending: Internal Medicine | Admitting: Internal Medicine

## 2017-01-16 DIAGNOSIS — N63 Unspecified lump in unspecified breast: Secondary | ICD-10-CM

## 2017-01-27 ENCOUNTER — Telehealth: Payer: Self-pay | Admitting: *Deleted

## 2017-01-27 NOTE — Telephone Encounter (Signed)
Patient verified DOB Patient is aware of mammogram showing no evidence of malignancy. Patient aware of a one year FU screening being recommended. Patient expressed her understanding and had no further questions at this time.

## 2017-01-27 NOTE — Telephone Encounter (Signed)
-----   Message from Tresa Garter, MD sent at 01/17/2017  9:32 AM EST ----- Please inform patient that her screening mammogram shows no evidence of malignancy. Recommend screening mammogram in one year

## 2017-02-05 ENCOUNTER — Emergency Department (HOSPITAL_COMMUNITY)
Admission: EM | Admit: 2017-02-05 | Discharge: 2017-02-05 | Disposition: A | Discharge status: Home / Self Care | Payer: Medicaid Other | Attending: Emergency Medicine | Admitting: Emergency Medicine

## 2017-02-05 ENCOUNTER — Emergency Department (HOSPITAL_COMMUNITY): Payer: Medicaid Other

## 2017-02-05 ENCOUNTER — Encounter (HOSPITAL_COMMUNITY): Payer: Self-pay | Admitting: Emergency Medicine

## 2017-02-05 DIAGNOSIS — Y999 Unspecified external cause status: Secondary | ICD-10-CM | POA: Insufficient documentation

## 2017-02-05 DIAGNOSIS — Y92410 Unspecified street and highway as the place of occurrence of the external cause: Secondary | ICD-10-CM | POA: Insufficient documentation

## 2017-02-05 DIAGNOSIS — S39012A Strain of muscle, fascia and tendon of lower back, initial encounter: Secondary | ICD-10-CM | POA: Insufficient documentation

## 2017-02-05 DIAGNOSIS — Y9389 Activity, other specified: Secondary | ICD-10-CM | POA: Insufficient documentation

## 2017-02-05 DIAGNOSIS — S3992XA Unspecified injury of lower back, initial encounter: Secondary | ICD-10-CM | POA: Diagnosis present

## 2017-02-05 DIAGNOSIS — T148XXA Other injury of unspecified body region, initial encounter: Secondary | ICD-10-CM

## 2017-02-05 DIAGNOSIS — J45909 Unspecified asthma, uncomplicated: Secondary | ICD-10-CM | POA: Diagnosis not present

## 2017-02-05 DIAGNOSIS — M5441 Lumbago with sciatica, right side: Secondary | ICD-10-CM

## 2017-02-05 DIAGNOSIS — F17210 Nicotine dependence, cigarettes, uncomplicated: Secondary | ICD-10-CM | POA: Diagnosis not present

## 2017-02-05 LAB — URINALYSIS, ROUTINE W REFLEX MICROSCOPIC
BILIRUBIN URINE: NEGATIVE
GLUCOSE, UA: NEGATIVE mg/dL
HGB URINE DIPSTICK: NEGATIVE
KETONES UR: NEGATIVE mg/dL
NITRITE: NEGATIVE
PROTEIN: NEGATIVE mg/dL
Specific Gravity, Urine: 1.011 (ref 1.005–1.030)
pH: 6 (ref 5.0–8.0)

## 2017-02-05 LAB — CBC WITH DIFFERENTIAL/PLATELET
BASOS PCT: 0 %
Basophils Absolute: 0 10*3/uL (ref 0.0–0.1)
Eosinophils Absolute: 0.3 10*3/uL (ref 0.0–0.7)
Eosinophils Relative: 3 %
HEMATOCRIT: 31.1 % — AB (ref 36.0–46.0)
HEMOGLOBIN: 11.2 g/dL — AB (ref 12.0–15.0)
Lymphocytes Relative: 29 %
Lymphs Abs: 2.5 10*3/uL (ref 0.7–4.0)
MCH: 28.6 pg (ref 26.0–34.0)
MCHC: 36 g/dL (ref 30.0–36.0)
MCV: 79.5 fL (ref 78.0–100.0)
Monocytes Absolute: 0.5 10*3/uL (ref 0.1–1.0)
Monocytes Relative: 6 %
NEUTROS ABS: 5.3 10*3/uL (ref 1.7–7.7)
NEUTROS PCT: 62 %
Platelets: 101 10*3/uL — ABNORMAL LOW (ref 150–400)
RBC: 3.91 MIL/uL (ref 3.87–5.11)
RDW: 15.2 % (ref 11.5–15.5)
WBC: 8.7 10*3/uL (ref 4.0–10.5)

## 2017-02-05 LAB — BASIC METABOLIC PANEL
ANION GAP: 7 (ref 5–15)
BUN: 7 mg/dL (ref 6–20)
CHLORIDE: 106 mmol/L (ref 101–111)
CO2: 26 mmol/L (ref 22–32)
CREATININE: 0.72 mg/dL (ref 0.44–1.00)
Calcium: 8.9 mg/dL (ref 8.9–10.3)
GFR calc Af Amer: >60 mL/min (ref >60)
GFR calc non Af Amer: >60 mL/min (ref >60)
Glucose, Bld: 92 mg/dL (ref 65–99)
POTASSIUM: 4.3 mmol/L (ref 3.5–5.1)
Sodium: 139 mmol/L (ref 135–145)

## 2017-02-05 LAB — POC URINE PREG, ED: Preg Test, Ur: NEGATIVE

## 2017-02-05 MED ORDER — NAPROXEN 500 MG PO TABS: 500.0000 mg | ORAL_TABLET | Freq: Two times a day (BID) | ORAL | 0 refills | Status: DC

## 2017-02-05 MED ORDER — METHOCARBAMOL 500 MG PO TABS: 500.0000 mg | ORAL_TABLET | Freq: Two times a day (BID) | ORAL | 0 refills | Status: DC

## 2017-02-05 NOTE — ED Triage Notes (Signed)
Pt c/o lower back 10/10 that doesn't let her walk, pt states she is taking medication at home with no relief, pt states she move some boxes at home and she started having this sharp pain.

## 2017-02-05 NOTE — ED Provider Notes (Signed)
[facility] DEPT Provider Note   CSN: [id number] Arrival date & time: 02/05/17  0103     History   Chief Complaint Chief Complaint  Patient presents with  . Back Pain    HPI Tina Mayo is a 46 y.o. female.  Patient with a history of chronic back pain with acute exacerbation starting yesterday. 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 No urinary or bowel incontinence, no saddle anesthesia. Pain is worse with movement and present but better at rest. The pain affects primarily her right lower back. No radiation of the pain.    The history is provided by the patient. No language interpreter was used.  Back Pain   Pertinent negatives include no chest pain, no fever, no numbness, no abdominal pain, no dysuria and no weakness.    Past Medical History:  Diagnosis Date  . Anxiety   . Asthma   . Bell's palsy   . Depression   . Sickle cell trait [facility])     Patient Active Problem List   Diagnosis Date Noted  . Trichimoniasis 05/03/2015  . Pap smear for cervical cancer screening 05/02/2015  . Perimenopausal 05/02/2015  . Vaginal discharge 05/02/2015  . Bell's palsy 02/28/2015  . Insomnia 12/26/2013  . Cystic breast 12/26/2013  . Tobacco abuse 09/21/2013  . Chest pain 08/26/2013  . Thrombocytopenia, unspecified 08/26/2013  . Cardiomegaly 08/26/2013  . Sickle cell trait ([place]) 08/26/2013  . Abnormal CT of the chest 08/26/2013  . Depression 08/26/2013  . Anemia, unspecified 08/26/2013    Past Surgical History:  Procedure Laterality Date  . benign breast tumor removed     2006  . BREAST BIOPSY Right 2014   benign  . BREAST EXCISIONAL BIOPSY Right 2007   benign   . right ankle surgery     2010  . TUBAL LIGATION      OB History    No data available       Home Medications    Prior to Admission medications   Medication Sig Start Date End Date Taking? Authorizing Provider  acetaminophen (TYLENOL) 500 MG tablet Take 500 mg by mouth  every 6 (six) hours as needed for moderate pain or headache.    Yes Historical Provider, MD  ARIPiprazole (ABILIFY) 15 MG tablet Take 15 mg by mouth at bedtime. 01/23/17  Yes Historical Provider, MD  ibuprofen (ADVIL,MOTRIN) 200 MG tablet Take 800 mg by mouth every 6 (six) hours as needed for headache or moderate pain.    Yes Historical Provider, MD  Menthol-Methyl Salicylate (MUSCLE RUB EX) Apply 1 application topically as needed (for pain).   Yes Historical Provider, MD  sertraline (ZOLOFT) 25 MG tablet Take 25 mg by mouth at bedtime.  01/23/17  Yes Historical Provider, MD  cyclobenzaprine (FLEXERIL) 10 MG tablet Take 1 tablet (10 mg total) by mouth 3 (three) times daily as needed for muscle spasms. Patient not taking: Reported on 10/05/2016 02/27/16   Sharlett Iles, MD  diclofenac (CATAFLAM) 50 MG tablet Take 1 tablet (50 mg total) by mouth 3 (three) times daily. One tablet TID with food prn pain. Patient not taking: Reported on 10/05/2016 08/12/16   Janne Napoleon, NP  diphenhydramine-acetaminophen (TYLENOL PM) 25-500 MG TABS tablet Take 1 tablet by mouth as needed (for pain).    Historical Provider, MD  traMADol (ULTRAM) 50 MG tablet Take 1 tablet (50 mg total) by mouth every 6 (six) hours as needed. Patient not taking: Reported on 10/05/2016 08/12/16  Janne Napoleon, NP  trimethoprim-polymyxin b (POLYTRIM) ophthalmic solution Place 2 drops into the right eye every 4 (four) hours. Patient not taking: Reported on 02/05/2017 12/23/16   Lysbeth Penner, FNP    Family History Family History  Problem Relation Age of Onset  . Breast cancer Mother   . Cancer Mother   . Diabetes Mellitus II Father   . Diabetes Father   . Breast cancer Maternal Grandmother     Social History Social History  Substance Use Topics  . Smoking status: Current Every Day Smoker    Packs/day: 0.00    Types: Cigarettes  . Smokeless tobacco: Current User  . Alcohol use No     Allergies   Levaquin [levofloxacin in  d5w]   Review of Systems Review of Systems  Constitutional: Positive for activity change. Negative for appetite change and fever.  Respiratory: Negative for shortness of breath.   Cardiovascular: Negative for chest pain.  Gastrointestinal: Negative for abdominal pain and nausea.  Genitourinary: Negative for dysuria.  Musculoskeletal: Positive for back pain.  Neurological: Negative for weakness and numbness.     Physical Exam Updated Vital Signs Ht 5\' 7"  (1.702 m)   Wt 91.6 kg   BMI 31.64 kg/m   Physical Exam  Constitutional: She is oriented to person, place, and time. She appears well-developed and well-nourished.  HENT:  Head: Normocephalic.  Neck: Normal range of motion. Neck supple.  Pulmonary/Chest: Effort normal.  Abdominal: Soft. Bowel sounds are normal. There is no tenderness. There is no rebound and no guarding.  Musculoskeletal: Normal range of motion.  Neurological: She is alert and oriented to person, place, and time.  Skin: Skin is warm and dry. No rash noted.  Psychiatric: She has a normal mood and affect.     ED Treatments / Results  Labs (all labs ordered are listed, but only abnormal results are displayed) Labs Reviewed  CBC WITH DIFFERENTIAL/PLATELET - Abnormal; Notable for the following:       Result Value   Hemoglobin 11.2 (*)    HCT 31.1 (*)    Platelets 101 (*)    All other components within normal limits  BASIC METABOLIC PANEL  URINALYSIS, ROUTINE W REFLEX MICROSCOPIC  POC URINE PREG, ED   Results for orders placed or performed during the hospital encounter of 02/05/17  CBC with Differential  Result Value Ref Range   WBC 8.7 4.0 - 10.5 K/uL   RBC 3.91 3.87 - 5.11 MIL/uL   Hemoglobin 11.2 (L) 12.0 - 15.0 g/dL   HCT 31.1 (L) 36.0 - 46.0 %   MCV 79.5 78.0 - 100.0 fL   MCH 28.6 26.0 - 34.0 pg   MCHC 36.0 30.0 - 36.0 g/dL   RDW 15.2 11.5 - 15.5 %   Platelets 101 (L) 150 - 400 K/uL   Neutrophils Relative % 62 %   Neutro Abs 5.3 1.7 - 7.7  K/uL   Lymphocytes Relative 29 %   Lymphs Abs 2.5 0.7 - 4.0 K/uL   Monocytes Relative 6 %   Monocytes Absolute 0.5 0.1 - 1.0 K/uL   Eosinophils Relative 3 %   Eosinophils Absolute 0.3 0.0 - 0.7 K/uL   Basophils Relative 0 %   Basophils Absolute 0.0 0.0 - 0.1 K/uL  Basic metabolic panel  Result Value Ref Range   Sodium 139 135 - 145 mmol/L   Potassium 4.3 3.5 - 5.1 mmol/L   Chloride 106 101 - 111 mmol/L   CO2 26 22 - 32  mmol/L   Glucose, Bld 92 65 - 99 mg/dL   BUN 7 6 - 20 mg/dL   Creatinine, Ser 0.72 0.44 - 1.00 mg/dL   Calcium 8.9 8.9 - 10.3 mg/dL   GFR calc non Af Amer >60 >60 mL/min   GFR calc Af Amer >60 >60 mL/min   Anion gap 7 5 - 15  Urinalysis, Routine w reflex microscopic  Result Value Ref Range   Color, Urine YELLOW YELLOW   APPearance HAZY (A) CLEAR   Specific Gravity, Urine 1.011 1.005 - 1.030   pH 6.0 5.0 - 8.0   Glucose, UA NEGATIVE NEGATIVE mg/dL   Hgb urine dipstick NEGATIVE NEGATIVE   Bilirubin Urine NEGATIVE NEGATIVE   Ketones, ur NEGATIVE NEGATIVE mg/dL   Protein, ur NEGATIVE NEGATIVE mg/dL   Nitrite NEGATIVE NEGATIVE   Leukocytes, UA TRACE (A) NEGATIVE   RBC / HPF 0-5 0 - 5 RBC/hpf   WBC, UA 0-5 0 - 5 WBC/hpf   Bacteria, UA RARE (A) NONE SEEN   Squamous Epithelial / LPF 6-30 (A) NONE SEEN   Mucous PRESENT   POC Urine Pregnancy, ED (do NOT order at [facility])  Result Value Ref Range   Preg Test, Ur NEGATIVE NEGATIVE    EKG  EKG Interpretation None       Radiology No results found.  Procedures Procedures (including critical care time)  Medications Ordered in ED Medications - No data to display   Initial Impression / Assessment and Plan / ED Course  I have reviewed the triage vital signs and the nursing notes.  Pertinent labs & imaging results that were available during my care of the patient were reviewed by me and considered in my medical decision making (see chart for details).     Patient with low back pain similar to previous. Worse  with movement, better with rest. No neurologic deficits. Felt to be musculoskeletal pain requiring supportive measures.  Final Clinical Impressions(s) / ED Diagnoses   Final diagnoses:  None   1. Low back pain 2. Musculoskeletal pain  New Prescriptions New Prescriptions   No medications on file     Charlann Lange, PA-C 02/07/17 [place], DO 02/11/17 [phone]

## 2017-02-05 NOTE — ED Triage Notes (Addendum)
Enter on wrong pt

## 2017-03-14 ENCOUNTER — Encounter (HOSPITAL_COMMUNITY): Payer: Self-pay | Admitting: Emergency Medicine

## 2017-03-14 ENCOUNTER — Ambulatory Visit (INDEPENDENT_AMBULATORY_CARE_PROVIDER_SITE_OTHER): Payer: Medicaid Other

## 2017-03-14 ENCOUNTER — Ambulatory Visit (HOSPITAL_COMMUNITY)
Admission: EM | Admit: 2017-03-14 | Discharge: 2017-03-14 | Disposition: A | Discharge status: Home / Self Care | Payer: Medicaid Other | Attending: Internal Medicine | Admitting: Internal Medicine

## 2017-03-14 DIAGNOSIS — S9032XA Contusion of left foot, initial encounter: Secondary | ICD-10-CM | POA: Diagnosis not present

## 2017-03-14 MED ORDER — NAPROXEN 500 MG PO TABS: 500.0000 mg | ORAL_TABLET | Freq: Two times a day (BID) | ORAL | 0 refills | Status: DC

## 2017-03-14 NOTE — ED Provider Notes (Signed)
[name]    CSN: [id number] Arrival date & time: 03/14/17  6948     History   Chief Complaint Chief Complaint  Patient presents with  . Foot Injury    HPI Tina Mayo is a 46 y.o. female. She presents today with left foot pain after dropping a medium-sized can of vegetables on her foot yesterday. There is a large bruise, and focal swelling. No other injuries reported. Able to move her ankle freely, but flexing her ankle makes her foot hurt more. Foot is warm. Skin is intact. Doesn't think she can go in to work today, has to stand all day. Also scheduled to work tomorrow.   HPI  Past Medical History:  Diagnosis Date  . Anxiety   . Asthma   . Bell's palsy   . Depression   . Sickle cell trait [facility])     Patient Active Problem List   Diagnosis Date Noted  . Trichimoniasis 05/03/2015  . Pap smear for cervical cancer screening 05/02/2015  . Perimenopausal 05/02/2015  . Vaginal discharge 05/02/2015  . Bell's palsy 02/28/2015  . Insomnia 12/26/2013  . Cystic breast 12/26/2013  . Tobacco abuse 09/21/2013  . Chest pain 08/26/2013  . Thrombocytopenia, unspecified 08/26/2013  . Cardiomegaly 08/26/2013  . Sickle cell trait ([place]) 08/26/2013  . Abnormal CT of the chest 08/26/2013  . Depression 08/26/2013  . Anemia, unspecified 08/26/2013    Past Surgical History:  Procedure Laterality Date  . benign breast tumor removed     2006  . BREAST BIOPSY Right 2014   benign  . BREAST EXCISIONAL BIOPSY Right 2007   benign   . right ankle surgery     2010  . TUBAL LIGATION       Home Medications    Prior to Admission medications   Medication Sig Start Date End Date Taking? Authorizing Provider  ARIPiprazole (ABILIFY) 15 MG tablet Take 15 mg by mouth at bedtime. 01/23/17  Yes Historical Provider, MD  sertraline (ZOLOFT) 25 MG tablet Take 25 mg by mouth at bedtime.  01/23/17  Yes Historical Provider, MD  trimethoprim-polymyxin b (POLYTRIM) ophthalmic solution  Place 2 drops into the right eye every 4 (four) hours. 12/23/16  Yes Lysbeth Penner, FNP  acetaminophen (TYLENOL) 500 MG tablet Take 500 mg by mouth every 6 (six) hours as needed for moderate pain or headache.     Historical Provider, MD  diphenhydramine-acetaminophen (TYLENOL PM) 25-500 MG TABS tablet Take 1 tablet by mouth as needed (for pain).    Historical Provider, MD  ibuprofen (ADVIL,MOTRIN) 200 MG tablet Take 800 mg by mouth every 6 (six) hours as needed for headache or moderate pain.     Historical Provider, MD  naproxen (NAPROSYN) 500 MG tablet Take 1 tablet (500 mg total) by mouth 2 (two) times daily. 03/14/17   Sherlene Shams, MD    Family History Family History  Problem Relation Age of Onset  . Breast cancer Mother   . Cancer Mother   . Diabetes Mellitus II Father   . Diabetes Father   . Breast cancer Maternal Grandmother     Social History Social History  Substance Use Topics  . Smoking status: Current Every Day Smoker    Packs/day: 0.00    Types: Cigarettes  . Smokeless tobacco: Current User  . Alcohol use No     Allergies   Levaquin [levofloxacin in d5w] and Penicillins   Review of Systems Review of Systems  All other systems reviewed  and are negative.    Physical Exam Triage Vital Signs ED Triage Vitals  Enc Vitals Group     BP 03/14/17 1006 131/78     Pulse Rate 03/14/17 1006 90     Resp 03/14/17 1006 18     Temp 03/14/17 1006 98.5 F (36.9 C)     Temp Source 03/14/17 1006 Oral     SpO2 03/14/17 1006 99 %     Weight --      Height --      Pain Score 03/14/17 1004 9     Pain Loc --    Updated Vital Signs BP 131/78 (BP Location: Right Arm)   Pulse 90   Temp 98.5 F (36.9 C) (Oral)   Resp 18   LMP 01/12/2017   SpO2 99%   Physical Exam  Constitutional: She is oriented to person, place, and time. No distress.  Alert, nicely groomed  HENT:  Head: Atraumatic.  Eyes:  Conjugate gaze, no eye redness/drainage  Neck: Neck supple.    Cardiovascular: Normal rate.   Pulmonary/Chest: No respiratory distress.  Abdominal: She exhibits no distension.  Musculoskeletal: Normal range of motion.  No leg swelling Dorsal left foot has a small hematoma, about an inch across, but is tender, at the midfoot area. No broken skin. No erythema/warmth ankle motion is intact. Foot itself is warm. Able to wiggle toes freely. Walked into the urgent care independently, wearing tennis shoes.  Neurological: She is alert and oriented to person, place, and time.  Skin: Skin is warm and dry.  No cyanosis  Nursing note and vitals reviewed.    UC Treatments / Results   Radiology Dg Foot Complete Left  Result Date: 03/14/2017 CLINICAL DATA:  Left foot pain after injury yesterday. EXAM: LEFT FOOT - COMPLETE 3+ VIEW COMPARISON:  None. FINDINGS: There is no evidence of fracture or dislocation. There is no evidence of arthropathy. Mild spurring of posterior calcaneus is noted. Soft tissues are unremarkable. IMPRESSION: No acute abnormality seen in the left foot. Electronically Signed   By: Marijo Conception, M.D.   On: 03/14/2017 10:33    Procedures Procedures (including critical care time) Post op shoe appllied by clinical staff  Final Clinical Impressions(s) / UC Diagnoses   Final diagnoses:  Contusion of left foot, initial encounter   Xray of foot did not show any bony injury/fracture.  Bruise is big and will be sore for several days but will heal.  Ice for 5-10 minutes several times daily will help with swelling and pain.  Post op shoe may help with pain.  Prescription for naproxen sent to the [facility] on [facility].  Recheck if not improving as expected, over the next several days.  Meds ordered this encounter  Medications  . naproxen (NAPROSYN) 500 MG tablet    Sig: Take 1 tablet (500 mg total) by mouth 2 (two) times daily.    Dispense:  30 tablet    Refill:  0      Sherlene Shams, MD 03/14/17 2046

## 2017-03-14 NOTE — ED Triage Notes (Signed)
Pt reports vegetable can fell on top of her left foot yest  Sx include swelling and pain.... Pain increases w/activity  A&O x4.. NAD

## 2017-03-14 NOTE — Discharge Instructions (Addendum)
Xray of foot did not show any bony injury/fracture.  Bruise is big and will be sore for several days but will heal.  Ice for 5-10 minutes several times daily will help with swelling and pain.  Post op shoe may help with pain.  Prescription for naproxen sent to the [facility] on [facility].  Recheck if not improving as expected, over the next several days.

## 2017-04-04 ENCOUNTER — Emergency Department (HOSPITAL_COMMUNITY)
Admission: EM | Admit: 2017-04-04 | Discharge: 2017-04-04 | Disposition: A | Discharge status: Home / Self Care | Payer: Medicaid Other | Attending: Emergency Medicine | Admitting: Emergency Medicine

## 2017-04-04 ENCOUNTER — Encounter (HOSPITAL_COMMUNITY): Payer: Self-pay | Admitting: Emergency Medicine

## 2017-04-04 ENCOUNTER — Emergency Department (HOSPITAL_COMMUNITY): Payer: Medicaid Other

## 2017-04-04 DIAGNOSIS — M545 Low back pain: Secondary | ICD-10-CM | POA: Insufficient documentation

## 2017-04-04 DIAGNOSIS — M546 Pain in thoracic spine: Secondary | ICD-10-CM | POA: Diagnosis present

## 2017-04-04 DIAGNOSIS — J45909 Unspecified asthma, uncomplicated: Secondary | ICD-10-CM | POA: Insufficient documentation

## 2017-04-04 DIAGNOSIS — G8929 Other chronic pain: Secondary | ICD-10-CM | POA: Insufficient documentation

## 2017-04-04 DIAGNOSIS — Z79899 Other long term (current) drug therapy: Secondary | ICD-10-CM | POA: Diagnosis not present

## 2017-04-04 DIAGNOSIS — F17210 Nicotine dependence, cigarettes, uncomplicated: Secondary | ICD-10-CM | POA: Diagnosis not present

## 2017-04-04 LAB — POC URINE PREG, ED: PREG TEST UR: NEGATIVE

## 2017-04-04 MED ORDER — NAPROXEN 250 MG PO TABS
500.0000 mg | ORAL_TABLET | Freq: Once | ORAL | Status: AC
  Administered 2017-04-04: 500 mg via ORAL
  Filled 2017-04-04: qty 2

## 2017-04-04 MED ORDER — MELOXICAM 7.5 MG PO TABS: 7.5000 mg | ORAL_TABLET | Freq: Every day | ORAL | 0 refills | Status: DC

## 2017-04-04 MED ORDER — CYCLOBENZAPRINE HCL 10 MG PO TABS: 10.0000 mg | ORAL_TABLET | Freq: Two times a day (BID) | ORAL | 0 refills | Status: DC | PRN

## 2017-04-04 NOTE — ED Provider Notes (Signed)
[facility] DEPT Provider Note   CSN: [id number] Arrival date & time: 04/04/17  0941   By signing my name below, I, Delton Prairie, attest that this documentation has been prepared under the direction and in the presence of  Isami Mehra, PA-C. Electronically Signed: Delton Prairie, ED Scribe. 04/04/17. 10:40 AM.   History   Chief Complaint Chief Complaint  Patient presents with  . Back Pain   HPI Comments:  Tina Mayo is a 46 y.o. female who presents to the Emergency Department complaining of persistent, gradually worsening, constant back pain (left>right) x 1 month. She notes her pain is located from her mid back to her upper bilateral buttocks. Sitting, standing and bending makes her symptoms worse. Laying on her side provides mild relief. She has also applied icy/hot, a heating pad and has taken naproxen with no significant relief. She notes 800 mg ibuprofen and Vicodin provides relief. Pt denies bowel/bladder incontinence, difficulty urinating, numbness, tingling, fevers, chills, abdominal pain, neck pain or any other associated symptoms. Pt also denies a hx of cancer or a hx of IV drug use. She is not followed by an orthopedist. No other complaints noted at this time.   The history is provided by the patient. No language interpreter was used.    Past Medical History:  Diagnosis Date  . Anxiety   . Asthma   . Bell's palsy   . Depression   . Sickle cell trait [facility])     Patient Active Problem List   Diagnosis Date Noted  . Trichimoniasis 05/03/2015  . Pap smear for cervical cancer screening 05/02/2015  . Perimenopausal 05/02/2015  . Vaginal discharge 05/02/2015  . Bell's palsy 02/28/2015  . Insomnia 12/26/2013  . Cystic breast 12/26/2013  . Tobacco abuse 09/21/2013  . Chest pain 08/26/2013  . Thrombocytopenia, unspecified ([place]) 08/26/2013  . Cardiomegaly 08/26/2013  . Sickle cell trait ([place]) 08/26/2013  . Abnormal CT of the chest 08/26/2013  . Depression 08/26/2013   . Anemia, unspecified 08/26/2013    Past Surgical History:  Procedure Laterality Date  . benign breast tumor removed     2006  . BREAST BIOPSY Right 2014   benign  . BREAST EXCISIONAL BIOPSY Right 2007   benign   . right ankle surgery     2010  . TUBAL LIGATION      OB History    No data available       Home Medications    Prior to Admission medications   Medication Sig Start Date End Date Taking? Authorizing Provider  acetaminophen (TYLENOL) 500 MG tablet Take 500 mg by mouth every 6 (six) hours as needed for moderate pain or headache.     Historical Provider, MD  ARIPiprazole (ABILIFY) 15 MG tablet Take 15 mg by mouth at bedtime. 01/23/17   Historical Provider, MD  cyclobenzaprine (FLEXERIL) 10 MG tablet Take 1 tablet (10 mg total) by mouth 2 (two) times daily as needed for muscle spasms. 04/04/17   Jakorian Marengo A Ahriana Gunkel, PA-C  diphenhydramine-acetaminophen (TYLENOL PM) 25-500 MG TABS tablet Take 1 tablet by mouth as needed (for pain).    Historical Provider, MD  ibuprofen (ADVIL,MOTRIN) 200 MG tablet Take 800 mg by mouth every 6 (six) hours as needed for headache or moderate pain.     Historical Provider, MD  meloxicam (MOBIC) 7.5 MG tablet Take 1 tablet (7.5 mg total) by mouth daily. 04/04/17   Markevius Trombetta A Jujhar Everett, PA-C  naproxen (NAPROSYN) 500 MG tablet Take 1 tablet (500 mg total)  by mouth 2 (two) times daily. 03/14/17   Sherlene Shams, MD  sertraline (ZOLOFT) 25 MG tablet Take 25 mg by mouth at bedtime.  01/23/17   Historical Provider, MD  trimethoprim-polymyxin b (POLYTRIM) ophthalmic solution Place 2 drops into the right eye every 4 (four) hours. 12/23/16   Lysbeth Penner, FNP    Family History Family History  Problem Relation Age of Onset  . Breast cancer Mother   . Cancer Mother   . Diabetes Mellitus II Father   . Diabetes Father   . Breast cancer Maternal Grandmother     Social History Social History  Substance Use Topics  . Smoking status: Current Every Day Smoker     Packs/day: 0.00    Types: Cigarettes  . Smokeless tobacco: Current User  . Alcohol use No     Allergies   Levaquin [levofloxacin in d5w] and Penicillins   Review of Systems Review of Systems  Constitutional: Negative for activity change, chills and fever.  Respiratory: Negative for shortness of breath.   Cardiovascular: Negative for chest pain.  Gastrointestinal: Negative for abdominal pain.  Genitourinary: Negative for difficulty urinating.  Musculoskeletal: Positive for back pain. Negative for neck pain.  Skin: Negative for rash.  Neurological: Negative for weakness and numbness.    Physical Exam Updated Vital Signs BP 101/68   Pulse 79   Temp 98.3 F (36.8 C) (Oral)   Resp 16   SpO2 100%   Physical Exam  Constitutional: She appears well-developed and well-nourished. No distress.  HENT:  Head: Normocephalic and atraumatic.  Eyes: Conjunctivae are normal.  Neck: Normal range of motion. Neck supple.  Cardiovascular: Normal rate and regular rhythm.  Exam reveals no gallop and no friction rub.   No murmur heard. Pulmonary/Chest: Effort normal and breath sounds normal. No respiratory distress. She has no wheezes. She has no rales.  Abdominal: Soft. She exhibits no distension. There is no tenderness. There is no guarding.  Musculoskeletal: Normal range of motion. She exhibits tenderness. She exhibits no edema or deformity.  Good active and passive ROM of the cervical spine with lateral flexion, extension, and flexion. No bony midline tenderness or paraspinal muscle tenderness.   Non-tender to palpation of the thoracic spine. Midline bony tenderness to the lumbar spine. Normal gait.    Neurological: She is alert.  Skin: Skin is warm and dry. No rash noted. She is not diaphoretic.  Psychiatric: Her behavior is normal.  Nursing note and vitals reviewed.  ED Treatments / Results  DIAGNOSTIC STUDIES:  Oxygen Saturation is 100% on RA, normal by my interpretation.     COORDINATION OF CARE:  10:36 AM Discussed treatment plan with pt at bedside and pt agreed to plan.  Labs (all labs ordered are listed, but only abnormal results are displayed) Labs Reviewed  POC URINE PREG, ED    EKG  EKG Interpretation None       Radiology Dg Lumbar Spine Complete  Result Date: 04/04/2017 CLINICAL DATA:  Low back pain for 1 month EXAM: LUMBAR SPINE - COMPLETE 4+ VIEW COMPARISON:  06/28/2015 FINDINGS: There is no evidence of lumbar spine fracture. Alignment is normal. Degenerative disc disease with disc height loss at L5-S1. Abnormal articulation of the left L5 transverse process with the sacrum bilaterally. Cholelithiasis. IMPRESSION: 1. Degenerative disc disease with disc height loss at L5-S1. 2. Cholelithiasis. Electronically Signed   By: Kathreen Devoid   On: 04/04/2017 11:57    Procedures Procedures (including critical care time)  Medications  Ordered in ED Medications  naproxen (NAPROSYN) tablet 500 mg (500 mg Oral Given 04/04/17 1047)     Initial Impression / Assessment and Plan / ED Course  I have reviewed the triage vital signs and the nursing notes.  Pertinent labs & imaging results that were available during my care of the patient were reviewed by me and considered in my medical decision making (see chart for details).     Patient with acute on chronic back pain.  No neurological deficits and normal neuro exam.  Patient can walk but states is painful.  No loss of bowel or bladder control.  No concern for cauda equina.  No fever, night sweats, weight loss, h/o cancer, IVDU.  RICE protocol, anti-inflammatory medication indicated and discussed with patient along with a referral to ortho.   Final Clinical Impressions(s) / ED Diagnoses   Final diagnoses:  Chronic bilateral low back pain without sciatica    New Prescriptions Discharge Medication List as of 04/04/2017 12:44 PM    START taking these medications   Details  cyclobenzaprine  (FLEXERIL) 10 MG tablet Take 1 tablet (10 mg total) by mouth 2 (two) times daily as needed for muscle spasms., Starting Fri 04/04/2017, Print    meloxicam (MOBIC) 7.5 MG tablet Take 1 tablet (7.5 mg total) by mouth daily., Starting Fri 04/04/2017, Print      I personally performed the services described in this documentation, which was scribed in my presence. The recorded information has been reviewed and is accurate.     Joanne Gavel, PA-C 04/04/17 1743    Varney Biles, MD 04/05/17 1203

## 2017-04-04 NOTE — ED Notes (Signed)
Warm blanket given to pt

## 2017-04-04 NOTE — ED Notes (Signed)
Patient transported to X-ray 

## 2017-04-04 NOTE — ED Triage Notes (Signed)
Pt states she has to stand a lot where she works and for the last week has been having lower back pain down into buttocks. Pt is ambulatory no numbness or tingling.

## 2017-04-04 NOTE — ED Notes (Signed)
Patient returned from xray.

## 2017-04-24 ENCOUNTER — Encounter: Payer: Self-pay | Admitting: Internal Medicine

## 2017-04-25 ENCOUNTER — Encounter: Payer: Self-pay | Admitting: Internal Medicine

## 2017-04-28 ENCOUNTER — Encounter: Payer: Self-pay | Admitting: Internal Medicine

## 2017-05-23 ENCOUNTER — Other Ambulatory Visit: Payer: Self-pay | Admitting: Internal Medicine

## 2017-05-23 DIAGNOSIS — R5381 Other malaise: Secondary | ICD-10-CM

## 2017-06-12 ENCOUNTER — Other Ambulatory Visit: Payer: Self-pay | Admitting: Internal Medicine

## 2017-06-12 DIAGNOSIS — E2839 Other primary ovarian failure: Secondary | ICD-10-CM

## 2017-07-11 ENCOUNTER — Ambulatory Visit
Admission: RE | Admit: 2017-07-11 | Discharge: 2017-07-11 | Disposition: A | Discharge status: Home / Self Care | Payer: Medicaid Other | Source: Ambulatory Visit | Attending: Internal Medicine | Admitting: Internal Medicine

## 2017-07-11 DIAGNOSIS — E2839 Other primary ovarian failure: Secondary | ICD-10-CM

## 2017-08-04 IMAGING — CR DG ANKLE COMPLETE 3+V*R*
3 series · 3 of 3 positions shown · non-contrast
Comparison: None.

CLINICAL DATA: Status post fall down stairs 3 days ago with a right
ankle injury. Continued pain. Initial encounter.

EXAM:
RIGHT ANKLE - COMPLETE 3+ VIEW

[ankle ap]
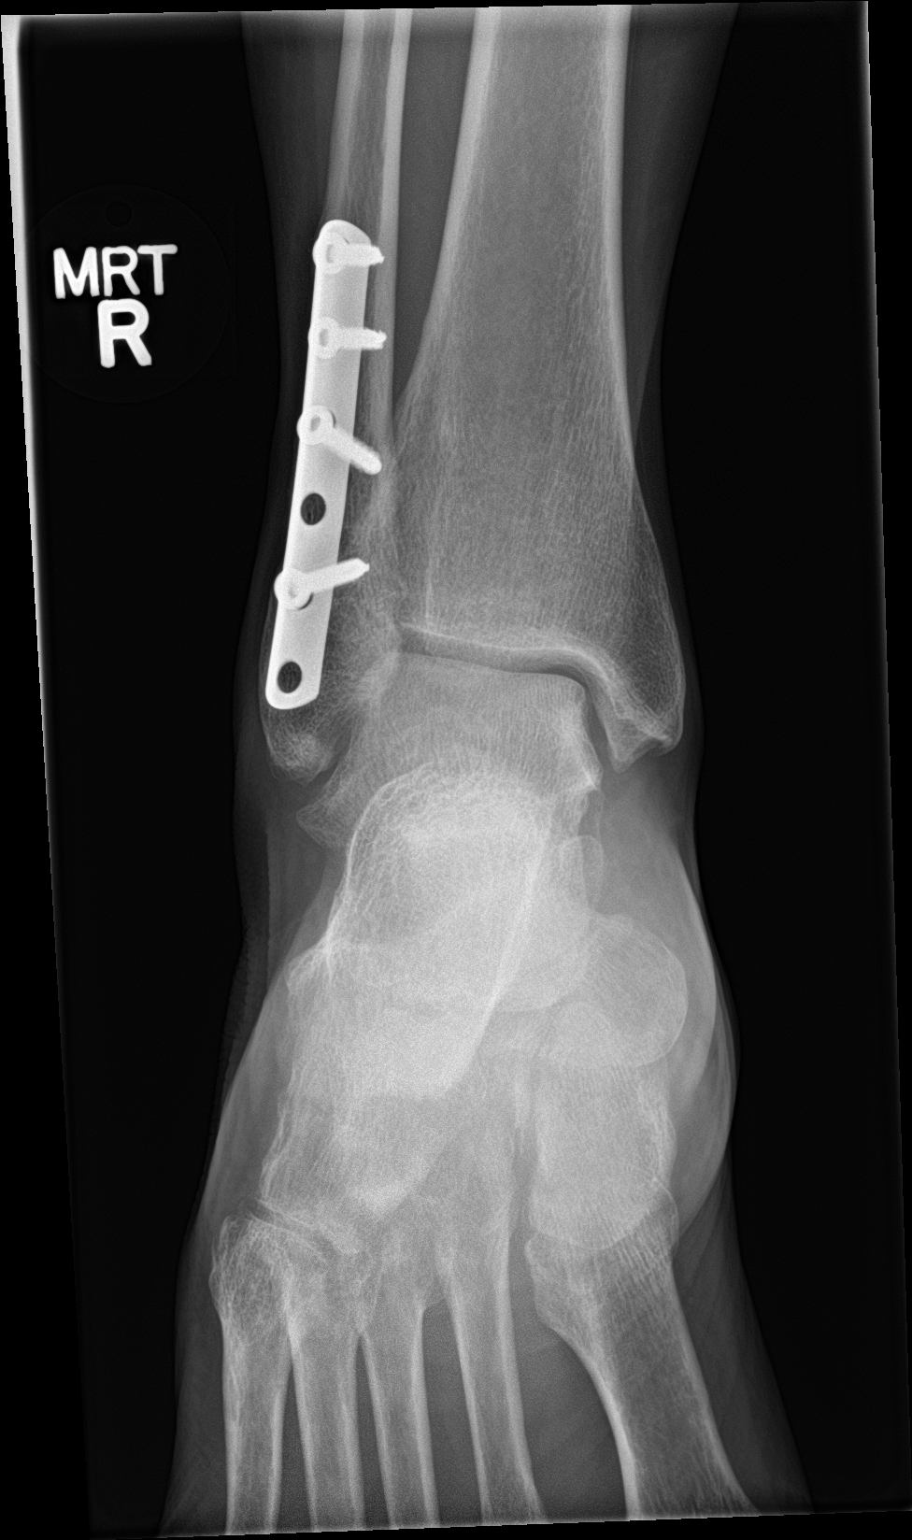

[ankle obl]
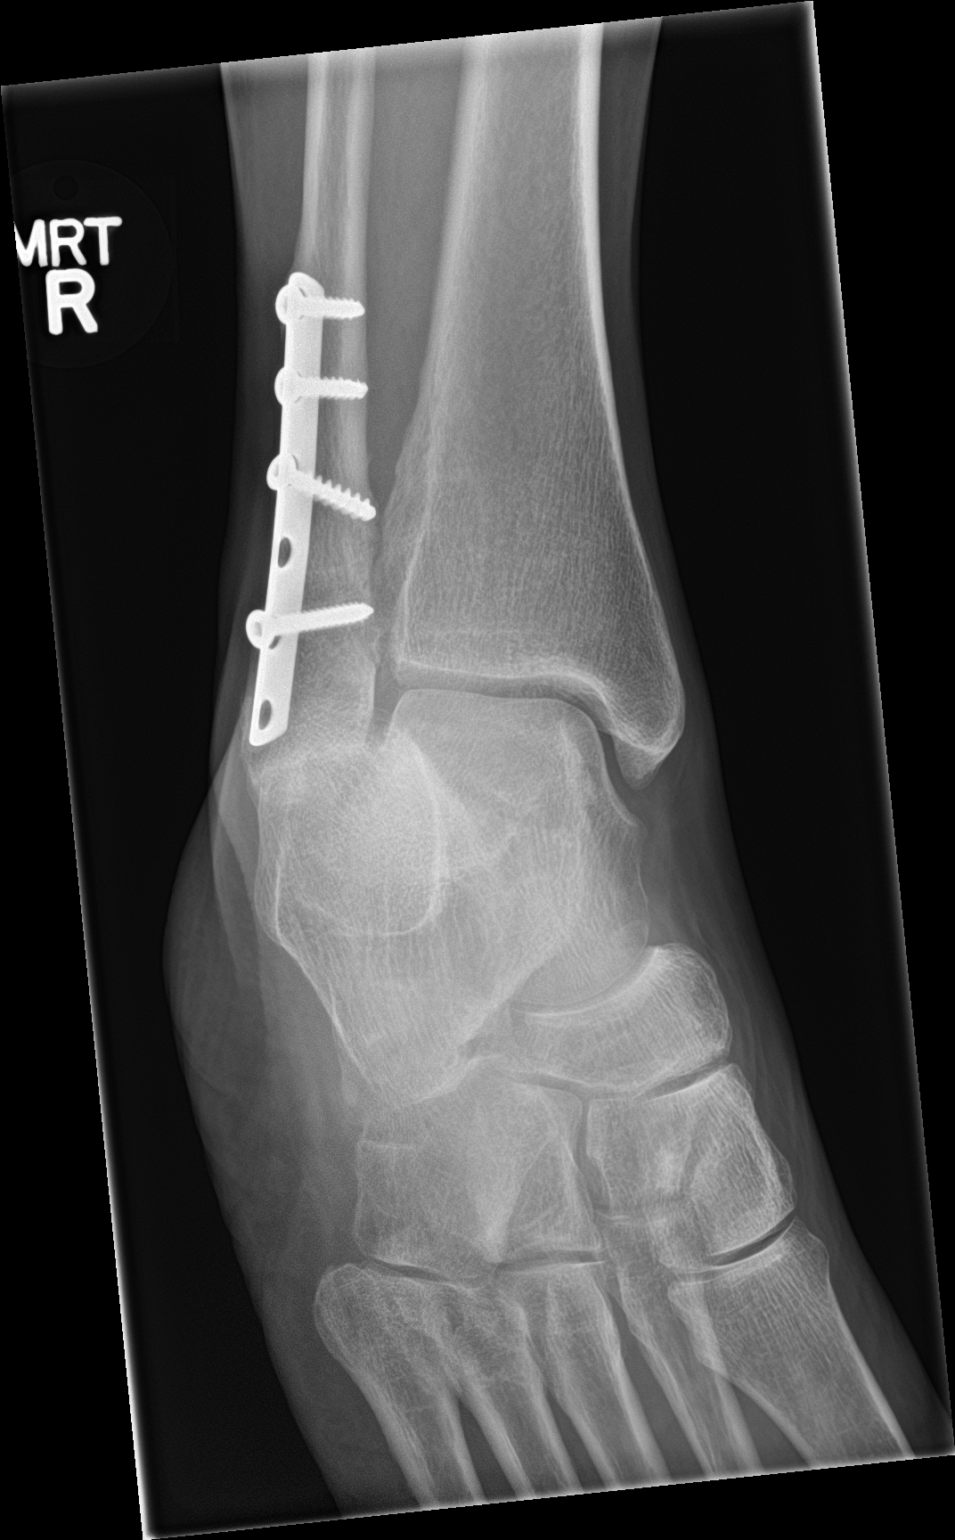

[ankle lat]
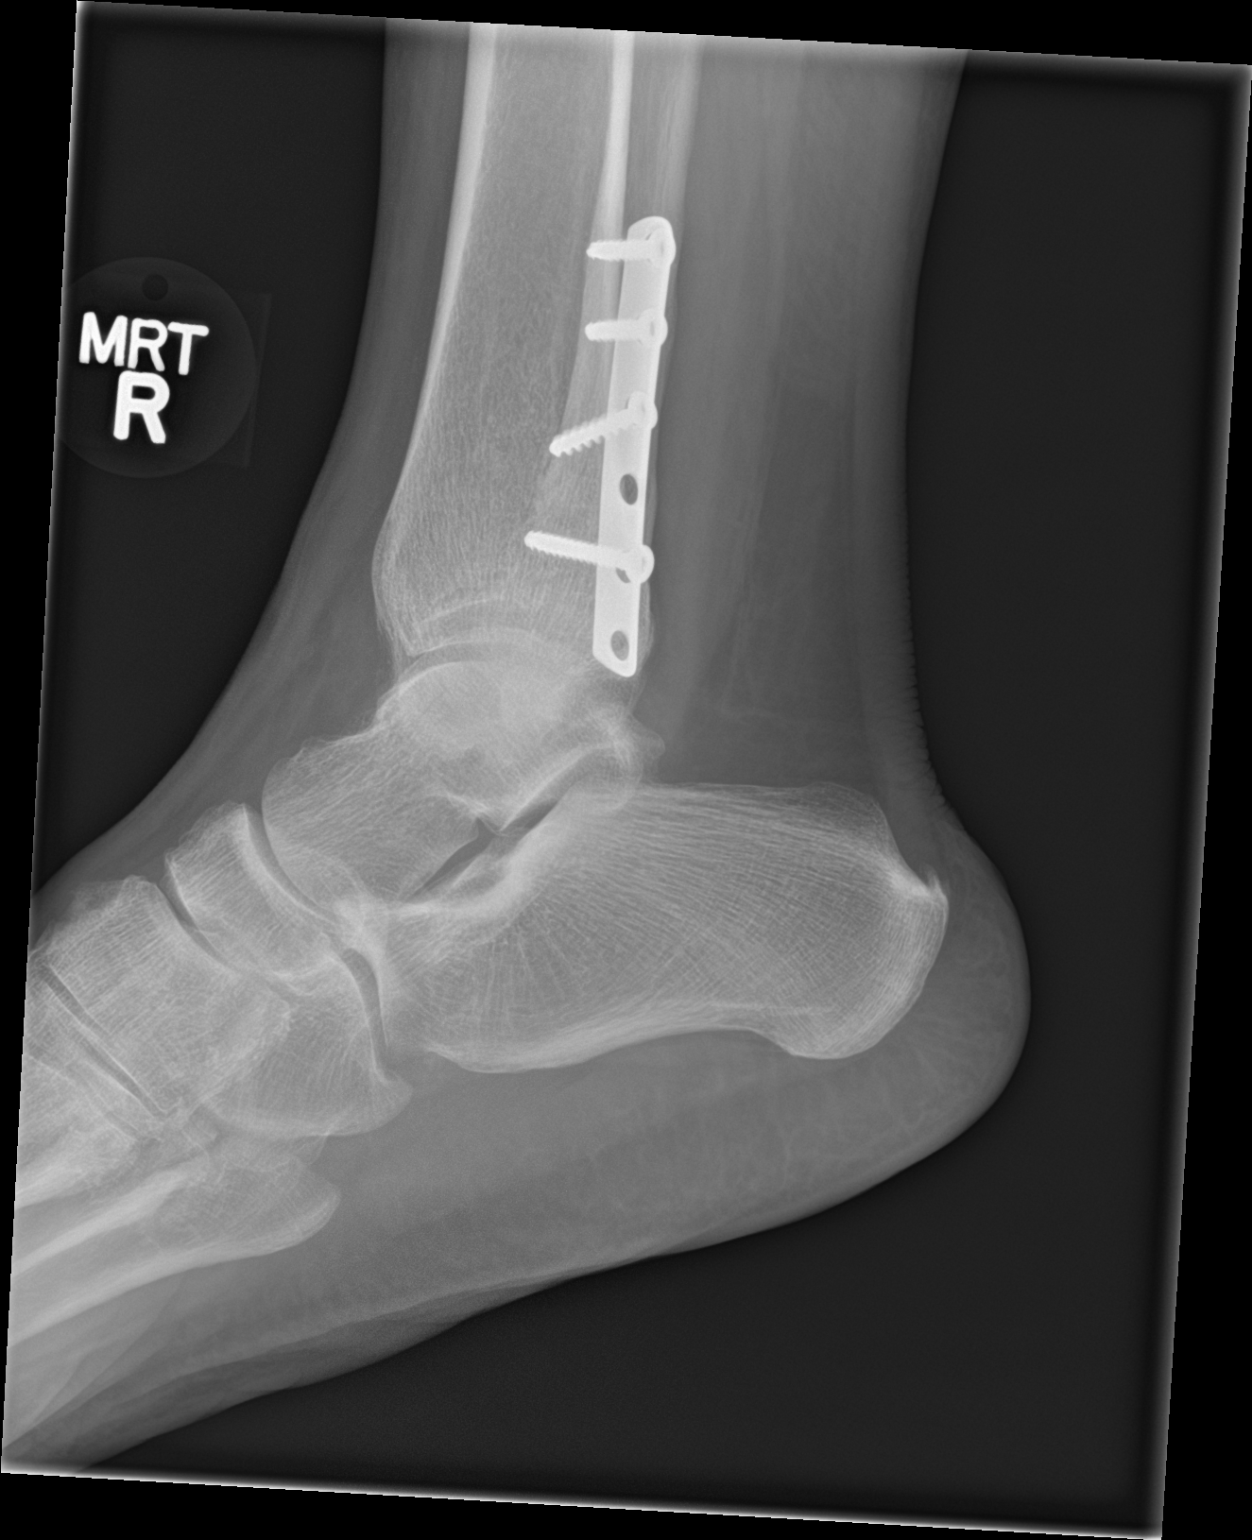

[3 of 3 positions shown; findings below may reference images not displayed]

FINDINGS: No acute bony or joint abnormality is identified. The patient has
remote healed distal fibular fracture with fixation hardware in
place. Soft tissues are unremarkable.
IMPRESSION: No acute abnormality.

## 2017-09-21 ENCOUNTER — Ambulatory Visit (HOSPITAL_COMMUNITY)
Admission: EM | Admit: 2017-09-21 | Discharge: 2017-09-21 | Disposition: A | Discharge status: Home / Self Care | Payer: Medicaid Other | Attending: Family Medicine | Admitting: Family Medicine

## 2017-09-21 ENCOUNTER — Encounter (HOSPITAL_COMMUNITY): Payer: Self-pay | Admitting: Family Medicine

## 2017-09-21 DIAGNOSIS — K0889 Other specified disorders of teeth and supporting structures: Secondary | ICD-10-CM

## 2017-09-21 MED ORDER — CHLORHEXIDINE GLUCONATE 0.12 % MT SOLN: 15.0000 mL | Freq: Two times a day (BID) | OROMUCOSAL | 0 refills | Status: DC

## 2017-09-21 MED ORDER — CEPHALEXIN 500 MG PO CAPS: 500.0000 mg | ORAL_CAPSULE | Freq: Four times a day (QID) | ORAL | 0 refills | Status: DC

## 2017-09-21 NOTE — ED Provider Notes (Addendum)
[facility]   [id number] 09/21/17 Arrival Time: 1559   SUBJECTIVE:  Tina Mayo is a 46 y.o. female who presents to the urgent care with complaint of dental pain.  This is been an ongoing problem and patient saw her primary care doctor who told her she needs to get an oral surgeon but did not make a referral. She has an upper plate but she has multiple caries in her lower teeth.  Patient is on Medicaid     Past Medical History:  Diagnosis Date  . Anxiety   . Asthma   . Bell's palsy   . Depression   . Sickle cell trait ([facility])    Family History  Problem Relation Age of Onset  . Breast cancer Mother   . Cancer Mother   . Diabetes Mellitus II Father   . Diabetes Father   . Breast cancer Maternal Grandmother    Social History   Social History  . Marital status: Single    Spouse name: N/A  . Number of children: N/A  . Years of education: N/A   Occupational History  . Not on file.   Social History Main Topics  . Smoking status: Current Every Day Smoker    Packs/day: 0.00    Types: Cigarettes  . Smokeless tobacco: Current User  . Alcohol use No  . Drug use: No  . Sexual activity: Not on file   Other Topics Concern  . Not on file   Social History Narrative  . No narrative on file   Current Meds  Medication Sig  . ARIPiprazole (ABILIFY) 15 MG tablet Take 15 mg by mouth at bedtime.  Marland Kitchen ibuprofen (ADVIL,MOTRIN) 200 MG tablet Take 800 mg by mouth every 6 (six) hours as needed for headache or moderate pain.   Marland Kitchen sertraline (ZOLOFT) 25 MG tablet Take 25 mg by mouth at bedtime.   . traMADol (ULTRAM) 50 MG tablet Take 50 mg by mouth at bedtime.   Allergies  Allergen Reactions  . Levaquin [Levofloxacin In D5w] Anaphylaxis  . Penicillins Swelling      ROS: As per HPI, remainder of ROS negative.   OBJECTIVE:   Vitals:   09/21/17 1616  BP: 113/80  Pulse: 89  Resp: 18  Temp: 98 F (36.7 C)  SpO2: 100%     General appearance: alert; no  distress Eyes: PERRL; EOMI; conjunctiva normal HENT: normocephalic; atraumatic; external ears normal without trauma; nasal mucosa normal; oral mucosa receded with multiple dental caries lower incisors with upper plate Neck: supple Back: no CVA tenderness Extremities: no cyanosis or edema; symmetrical with no gross deformities Skin: warm and dry Neurologic: normal gait; grossly normal Psychological: alert and cooperative; normal mood and affect      Labs:  Results for orders placed or performed during the hospital encounter of 04/04/17  POC urine preg, ED  Result Value Ref Range   Preg Test, Ur NEGATIVE NEGATIVE    Labs Reviewed - No data to display  No results found.     ASSESSMENT & PLAN:  No diagnosis found.  Meds ordered this encounter  Medications  . traMADol (ULTRAM) 50 MG tablet    Sig: Take 50 mg by mouth at bedtime.  . cephALEXin (KEFLEX) 500 MG capsule    Sig: Take 1 capsule (500 mg total) by mouth 4 (four) times daily.    Dispense:  20 capsule    Refill:  0  . chlorhexidine (PERIDEX) 0.12 % solution    Sig: Use  as directed 15 mLs in the mouth or throat 2 (two) times daily.    Dispense:  120 mL    Refill:  0    Reviewed expectations re: course of current medical issues. Questions answered. Outlined signs and symptoms indicating need for more acute intervention. Patient verbalized understanding. After Visit Summary given.    Procedures:      Robyn Haber, MD 09/21/17 1625    Robyn Haber, MD 09/21/17 [phone]

## 2017-09-21 NOTE — Discharge Instructions (Signed)
Call one of the oral surgeons listed below. Further evaluation. Explained that you were referred from the [facility] urgent care center.

## 2017-09-21 NOTE — ED Triage Notes (Signed)
Pt c/o bilateral tooth pain, has dentist appt to remove teeth on 18th, cant take pain any longer,t ook 800mg  ibuprofen this am.

## 2017-12-16 ENCOUNTER — Ambulatory Visit (HOSPITAL_COMMUNITY): Payer: Worker's Compensation

## 2017-12-16 ENCOUNTER — Ambulatory Visit (HOSPITAL_COMMUNITY)
Admission: EM | Admit: 2017-12-16 | Discharge: 2017-12-16 | Disposition: A | Discharge status: Home / Self Care | Payer: Worker's Compensation | Attending: Internal Medicine | Admitting: Internal Medicine

## 2017-12-16 ENCOUNTER — Encounter (HOSPITAL_COMMUNITY): Payer: Self-pay | Admitting: Emergency Medicine

## 2017-12-16 DIAGNOSIS — S61011A Laceration without foreign body of right thumb without damage to nail, initial encounter: Secondary | ICD-10-CM | POA: Diagnosis not present

## 2017-12-16 DIAGNOSIS — Z23 Encounter for immunization: Secondary | ICD-10-CM | POA: Diagnosis not present

## 2017-12-16 DIAGNOSIS — W268XXA Contact with other sharp object(s), not elsewhere classified, initial encounter: Secondary | ICD-10-CM

## 2017-12-16 MED ORDER — TETANUS-DIPHTH-ACELL PERTUSSIS 5-2.5-18.5 LF-MCG/0.5 IM SUSP
INTRAMUSCULAR | Status: AC
  Filled 2017-12-16: qty 0.5

## 2017-12-16 MED ORDER — HYDROCODONE-ACETAMINOPHEN 5-325 MG PO TABS
ORAL_TABLET | ORAL | Status: AC
  Filled 2017-12-16: qty 1

## 2017-12-16 MED ORDER — LIDOCAINE-EPINEPHRINE-TETRACAINE (LET) SOLUTION
3.0000 mL | Freq: Once | NASAL | Status: AC
  Administered 2017-12-16: 19:00:00 3 mL via TOPICAL

## 2017-12-16 MED ORDER — CEPHALEXIN 500 MG PO CAPS: 500.0000 mg | ORAL_CAPSULE | Freq: Three times a day (TID) | ORAL | 0 refills | Status: DC

## 2017-12-16 MED ORDER — HYDROCODONE-ACETAMINOPHEN 5-325 MG PO TABS: 1.0000 | ORAL_TABLET | ORAL | 0 refills | Status: DC | PRN

## 2017-12-16 MED ORDER — LIDOCAINE-EPINEPHRINE-TETRACAINE (LET) SOLUTION
NASAL | Status: AC
  Filled 2017-12-16: qty 3

## 2017-12-16 MED ORDER — HYDROCODONE-ACETAMINOPHEN 5-325 MG PO TABS
1.0000 | ORAL_TABLET | ORAL | Status: AC
  Administered 2017-12-16: 1 via ORAL

## 2017-12-16 MED ORDER — TETANUS-DIPHTH-ACELL PERTUSSIS 5-2.5-18.5 LF-MCG/0.5 IM SUSP
0.5000 mL | Freq: Once | INTRAMUSCULAR | Status: AC
  Administered 2017-12-16: 0.5 mL via INTRAMUSCULAR

## 2017-12-16 NOTE — Discharge Instructions (Signed)
Please keep the laceration clean.  He may shower but do not submerge underwater.  At work laceration needs to be clean and dry until sutures are removed around 10 days.

## 2017-12-16 NOTE — ED Triage Notes (Signed)
PT was cleaning a meat machine at work and sustained a laceration to right thumb. Bleeding controlled with dressing. TDAP more than 5 years ago.

## 2017-12-16 NOTE — ED Provider Notes (Signed)
[place]    CSN: [id number] Arrival date & time: 12/16/17  1800     History   Chief Complaint Chief Complaint  Patient presents with  . Extremity Laceration    HPI Tina Mayo is a 47 y.o. female presents to the urgent care facility for evaluation of right thumb laceration.  Laceration occurred at work around 5:15 PM when she was cleaning the meat slicer and she suffered a laceration to the dorsal aspect of her thumb.  She is right-hand dominant.  No numbness or tingling.  Pain is mild to moderate.  No other injury to her body.  Tetanus is not up-to-date.   HPI  Past Medical History:  Diagnosis Date  . Anxiety   . Asthma   . Bell's palsy   . Depression   . Sickle cell trait [facility])     Patient Active Problem List   Diagnosis Date Noted  . Trichimoniasis 05/03/2015  . Pap smear for cervical cancer screening 05/02/2015  . Perimenopausal 05/02/2015  . Vaginal discharge 05/02/2015  . Bell's palsy 02/28/2015  . Insomnia 12/26/2013  . Cystic breast 12/26/2013  . Tobacco abuse 09/21/2013  . Chest pain 08/26/2013  . Thrombocytopenia, unspecified ([place]) 08/26/2013  . Cardiomegaly 08/26/2013  . Sickle cell trait ([place]) 08/26/2013  . Abnormal CT of the chest 08/26/2013  . Depression 08/26/2013  . Anemia, unspecified 08/26/2013    Past Surgical History:  Procedure Laterality Date  . benign breast tumor removed     2006  . BREAST BIOPSY Right 2014   benign  . BREAST EXCISIONAL BIOPSY Right 2007   benign   . right ankle surgery     2010  . TUBAL LIGATION      OB History    No data available       Home Medications    Prior to Admission medications   Medication Sig Start Date End Date Taking? Authorizing Provider  ARIPiprazole (ABILIFY) 15 MG tablet Take 15 mg by mouth at bedtime. 01/23/17  Yes [provider]  sertraline (ZOLOFT) 25 MG tablet Take 25 mg by mouth at bedtime.  01/23/17  Yes [provider]  acetaminophen (TYLENOL)  500 MG tablet Take 500 mg by mouth every 6 (six) hours as needed for moderate pain or headache.     [provider]  cephALEXin (KEFLEX) 500 MG capsule Take 1 capsule (500 mg total) by mouth 3 (three) times daily. 12/16/17   Duanne Guess, PA-C  chlorhexidine (PERIDEX) 0.12 % solution Use as directed 15 mLs in the mouth or throat 2 (two) times daily. 09/21/17   Robyn Haber, MD  HYDROcodone-acetaminophen (NORCO) 5-325 MG tablet Take 1 tablet by mouth every 4 (four) hours as needed for moderate pain. 12/16/17   Duanne Guess, PA-C  ibuprofen (ADVIL,MOTRIN) 200 MG tablet Take 800 mg by mouth every 6 (six) hours as needed for headache or moderate pain.     [provider]    Family History Family History  Problem Relation Age of Onset  . Breast cancer Mother   . Cancer Mother   . Diabetes Mellitus II Father   . Diabetes Father   . Breast cancer Maternal Grandmother     Social History Social History   Tobacco Use  . Smoking status: Current Every Day Smoker    Packs/day: 0.10    Types: Cigarettes  . Smokeless tobacco: Current User  Substance Use Topics  . Alcohol use: No  . Drug use: No  Allergies   Levaquin [levofloxacin in d5w] and Penicillins   Review of Systems Review of Systems  Musculoskeletal: Positive for arthralgias.  Skin: Positive for wound. Negative for rash.  Neurological: Negative for weakness and numbness.     Physical Exam Triage Vital Signs ED Triage Vitals [12/16/17 1841]  Enc Vitals Group     BP 101/63     Pulse Rate 93     Resp 16     Temp 98.2 F (36.8 C)     Temp Source Oral     SpO2 98 %     Weight 206 lb (93.4 kg)     Height 5\' 7"  (1.702 m)     Head Circumference      Peak Flow      Pain Score 10     Pain Loc      Pain Edu?      Excl. in [place]?    No data found.  Updated Vital Signs BP 101/63   Pulse 93   Temp 98.2 F (36.8 C) (Oral)   Resp 16   Ht 5\' 7"  (1.702 m)   Wt 206 lb (93.4 kg)   LMP 01/12/2017    SpO2 98%   BMI 32.26 kg/m   Visual Acuity Right Eye Distance:   Left Eye Distance:   Bilateral Distance:    Right Eye Near:   Left Eye Near:    Bilateral Near:     Physical Exam  Constitutional: She appears well-developed and well-nourished.  HENT:  Head: Normocephalic and atraumatic.  Eyes: Conjunctivae are normal.  Cardiovascular: Normal rate.  Pulmonary/Chest: Effort normal. No respiratory distress.  Musculoskeletal:  Examination of the right thumb shows a 4 similar laceration on the dorsal aspect of the interphalangeal joint.  No visible or palpable foreign body.  She is able to maintain full active extension as well as perform full active flexion of the thumb interphalangeal joint.  Bleeding is well controlled.  No nail injury.  No significant deformity seen.  Skin: Capillary refill takes less than 2 seconds. No rash noted. No erythema.  Psychiatric: She has a normal mood and affect. Her behavior is normal.  Nursing note and vitals reviewed.    UC Treatments / Results  Labs (all labs ordered are listed, but only abnormal results are displayed) Labs Reviewed - No data to display  EKG  EKG Interpretation None       Radiology Dg Finger Thumb Right  Result Date: 12/16/2017 CLINICAL DATA:  The patient lacerated her right thumb today while cleaning a meat slicer. Initial encounter. EXAM: RIGHT THUMB 2+V COMPARISON:  None. FINDINGS: Skin defect over the dorsum of the thumb at the IP joint is compatible with laceration. No fracture, dislocation or radiopaque foreign body. IMPRESSION: Laceration without fracture or foreign body. Electronically Signed   By: Inge Rise M.D.   On: 12/16/2017 19:32    Procedures Laceration Repair Date/Time: 12/16/2017 8:25 PM Performed by: Duanne Guess, PA-C Authorized by: Wynona Luna, MD   Consent:    Consent obtained:  Verbal   Consent given by:  Patient   Risks discussed:  Infection, pain, retained foreign body and  poor cosmetic result   Alternatives discussed:  No treatment Anesthesia (see MAR for exact dosages):    Anesthesia method:  Topical application   Topical anesthetic:  LET Laceration details:    Location:  Finger   Finger location:  R thumb   Length (cm):  4   Depth (mm):  2 Repair type:    Repair type:  Simple Pre-procedure details:    Preparation:  Patient was prepped and draped in usual sterile fashion and imaging obtained to evaluate for foreign bodies Exploration:    Hemostasis achieved with:  LET   Wound exploration: wound explored through full range of motion and entire depth of wound probed and visualized     Contaminated: no   Treatment:    Area cleansed with:  Betadine   Amount of cleaning:  Standard   Visualized foreign bodies/material removed: no   Skin repair:    Repair method:  Sutures   Suture size:  5-0   Suture material:  Nylon   Suture technique:  Simple interrupted   Number of sutures:  5 Approximation:    Approximation:  Close   V[place] border: well-aligned   Post-procedure details:    Dressing:  Adhesive bandage   Patient tolerance of procedure:  Tolerated well, no immediate complications   (including critical care time)  Medications Ordered in UC Medications  lidocaine-EPINEPHrine-tetracaine (LET) solution (3 mLs Topical Given 12/16/17 1911)  Tdap (BOOSTRIX) injection 0.5 mL (0.5 mLs Intramuscular Given 12/16/17 1915)  HYDROcodone-acetaminophen (NORCO/VICODIN) 5-325 MG per tablet 1 tablet (1 tablet Oral Given 12/16/17 2013)     Initial Impression / Assessment and Plan / UC Course  I have reviewed the triage vital signs and the nursing notes.  Pertinent labs & imaging results that were available during my care of the patient were reviewed by me and considered in my medical decision making (see chart for details).     47 year old female with workers comp injury to the right thumb.  She suffered a dorsal laceration to the thumb.  Tetanus was updated  in the clinic.  Sutures are applied.  She will keep area clean and dry.  She will follow-up in 10 days for suture removal.  She is placed on prophylactic antibiotic as well as given pain medication.  Final Clinical Impressions(s) / UC Diagnoses   Final diagnoses:  Laceration of right thumb without foreign body without damage to nail, initial encounter    ED Discharge Orders        Ordered    cephALEXin (KEFLEX) 500 MG capsule  3 times daily     12/16/17 2015    HYDROcodone-acetaminophen (NORCO) 5-325 MG tablet  Every 4 hours PRN     12/16/17 2015       Controlled Substance Prescriptions [place] Controlled Substance Registry consulted? Yes, I have consulted the [place] Controlled Substances Registry for this patient, and feel the risk/benefit ratio today is favorable for proceeding with this prescription for a controlled substance.   Duanne Guess, [place] 12/16/17 2027

## 2017-12-26 ENCOUNTER — Ambulatory Visit (HOSPITAL_COMMUNITY)
Admission: EM | Admit: 2017-12-26 | Discharge: 2017-12-26 | Disposition: A | Discharge status: Other Acute Inpatient Hospital | Payer: Self-pay

## 2018-01-13 ENCOUNTER — Emergency Department (HOSPITAL_COMMUNITY)
Admission: EM | Admit: 2018-01-13 | Discharge: 2018-01-13 | Disposition: A | Discharge status: Home / Self Care | Payer: Medicaid Other | Attending: Physician Assistant | Admitting: Physician Assistant

## 2018-01-13 ENCOUNTER — Encounter (HOSPITAL_COMMUNITY): Payer: Self-pay

## 2018-01-13 ENCOUNTER — Emergency Department (HOSPITAL_COMMUNITY): Payer: Medicaid Other

## 2018-01-13 ENCOUNTER — Other Ambulatory Visit: Payer: Self-pay

## 2018-01-13 DIAGNOSIS — M25562 Pain in left knee: Secondary | ICD-10-CM

## 2018-01-13 DIAGNOSIS — J45909 Unspecified asthma, uncomplicated: Secondary | ICD-10-CM | POA: Insufficient documentation

## 2018-01-13 DIAGNOSIS — Y9389 Activity, other specified: Secondary | ICD-10-CM | POA: Insufficient documentation

## 2018-01-13 DIAGNOSIS — W108XXA Fall (on) (from) other stairs and steps, initial encounter: Secondary | ICD-10-CM | POA: Insufficient documentation

## 2018-01-13 DIAGNOSIS — Y929 Unspecified place or not applicable: Secondary | ICD-10-CM | POA: Diagnosis not present

## 2018-01-13 DIAGNOSIS — F17210 Nicotine dependence, cigarettes, uncomplicated: Secondary | ICD-10-CM | POA: Insufficient documentation

## 2018-01-13 DIAGNOSIS — Y999 Unspecified external cause status: Secondary | ICD-10-CM | POA: Insufficient documentation

## 2018-01-13 DIAGNOSIS — Z79899 Other long term (current) drug therapy: Secondary | ICD-10-CM | POA: Insufficient documentation

## 2018-01-13 MED ORDER — ACETAMINOPHEN 325 MG PO TABS
650.0000 mg | ORAL_TABLET | Freq: Once | ORAL | Status: AC
  Administered 2018-01-13: 650 mg via ORAL
  Filled 2018-01-13: qty 2

## 2018-01-13 NOTE — Discharge Instructions (Signed)
Please read attached information. If you experience any new or worsening signs or symptoms please return to the emergency room for evaluation. Please follow-up with your primary care provider or specialist as discussed.  °

## 2018-01-13 NOTE — ED Provider Notes (Signed)
[place] EMERGENCY DEPARTMENT Provider Note   CSN: [id number] Arrival date & time: 01/13/18  0930     History   Chief Complaint Chief Complaint  Patient presents with  . Knee Pain    HPI Tina Mayo is a 47 y.o. female.  HPI   47 year old female presents today with complaints of left knee pain.  Patient notes approximately 5 days ago she tripped going up stairs and landed on her left anterior knee.  She notes distal complaints.  Patient notes pain medication is not working, reports pain with ambulation.  She notes history of chronic back pain.  Past Medical History:  Diagnosis Date  . Anxiety   . Asthma   . Bell's palsy   . Depression   . Sickle cell trait [facility])     Patient Active Problem List   Diagnosis Date Noted  . Trichimoniasis 05/03/2015  . Pap smear for cervical cancer screening 05/02/2015  . Perimenopausal 05/02/2015  . Vaginal discharge 05/02/2015  . Bell's palsy 02/28/2015  . Insomnia 12/26/2013  . Cystic breast 12/26/2013  . Tobacco abuse 09/21/2013  . Chest pain 08/26/2013  . Thrombocytopenia, unspecified ([place]) 08/26/2013  . Cardiomegaly 08/26/2013  . Sickle cell trait ([place]) 08/26/2013  . Abnormal CT of the chest 08/26/2013  . Depression 08/26/2013  . Anemia, unspecified 08/26/2013    Past Surgical History:  Procedure Laterality Date  . benign breast tumor removed     2006  . BREAST BIOPSY Right 2014   benign  . BREAST EXCISIONAL BIOPSY Right 2007   benign   . right ankle surgery     2010  . TUBAL LIGATION      OB History    No data available       Home Medications    Prior to Admission medications   Medication Sig Start Date End Date Taking? Authorizing Provider  acetaminophen (TYLENOL) 500 MG tablet Take 500 mg by mouth every 6 (six) hours as needed for moderate pain or headache.     [provider]  ARIPiprazole (ABILIFY) 15 MG tablet Take 15 mg by mouth at bedtime. 01/23/17   [provider]  cephALEXin (KEFLEX) 500 MG capsule Take 1 capsule (500 mg total) by mouth 3 (three) times daily. 12/16/17   Duanne Guess, PA-C  chlorhexidine (PERIDEX) 0.12 % solution Use as directed 15 mLs in the mouth or throat 2 (two) times daily. 09/21/17   Robyn Haber, MD  HYDROcodone-acetaminophen (NORCO) 5-325 MG tablet Take 1 tablet by mouth every 4 (four) hours as needed for moderate pain. 12/16/17   Duanne Guess, PA-C  ibuprofen (ADVIL,MOTRIN) 200 MG tablet Take 800 mg by mouth every 6 (six) hours as needed for headache or moderate pain.     [provider]  sertraline (ZOLOFT) 25 MG tablet Take 25 mg by mouth at bedtime.  01/23/17   [provider]    Family History Family History  Problem Relation Age of Onset  . Breast cancer Mother   . Cancer Mother   . Diabetes Mellitus II Father   . Diabetes Father   . Breast cancer Maternal Grandmother     Social History Social History   Tobacco Use  . Smoking status: Current Every Day Smoker    Packs/day: 0.10    Types: Cigarettes  . Smokeless tobacco: Current User  Substance Use Topics  . Alcohol use: No  . Drug use: No     Allergies   Levaquin [  levofloxacin in d5w] and Penicillins   Review of Systems Review of Systems  All other systems reviewed and are negative.    Physical Exam Updated Vital Signs BP 138/86 (BP Location: Right Arm)   Pulse 86   Temp 97.9 F (36.6 C) (Oral)   Resp 18   LMP 01/12/2017   SpO2 100%   Physical Exam  Constitutional: She is oriented to person, place, and time. She appears well-developed and well-nourished.  HENT:  Head: Normocephalic and atraumatic.  Eyes: Conjunctivae are normal. Pupils are equal, round, and reactive to light. Right eye exhibits no discharge. Left eye exhibits no discharge. No scleral icterus.  Neck: Normal range of motion. No JVD present. No tracheal deviation present.  Pulmonary/Chest: Effort normal. No stridor.  Musculoskeletal:    Left knee atraumatic no swelling or edema, minor tenderness to palpation of the suprapatellar region, no significant laxity of the knee, no warmth to touch, full active range of motion  Neurological: She is alert and oriented to person, place, and time. Coordination normal.  Psychiatric: She has a normal mood and affect. Her behavior is normal. Judgment and thought content normal.  Nursing note and vitals reviewed.    ED Treatments / Results  Labs (all labs ordered are listed, but only abnormal results are displayed) Labs Reviewed - No data to display  EKG  EKG Interpretation None       Radiology Dg Knee Complete 4 Views Left  Result Date: 01/13/2018 CLINICAL DATA:  [place] on concrete steps four days ago. Persistent pain and stiffness in the left knee. EXAM: LEFT KNEE - COMPLETE 4+ VIEW COMPARISON:  Left knee radiographs 04/19/2014. FINDINGS: Left knee is located. No acute fractures present. There no effusion. Minimal degenerative changes are stable. IMPRESSION: 1. Stable minimal degenerative changes of the left knee. 2. No acute abnormality. Electronically Signed   By: [place] More[place] M.D.   On: 01/13/2018 10:39    Procedures Procedures (including critical care time)  Medications Ordered in ED Medications - No data to display   Initial Impression / Assessment and Plan / ED Course  I have reviewed the triage vital signs and the nursing notes.  Pertinent labs & imaging results that were available during my care of the patient were reviewed by me and considered in my medical decision making (see chart for details).      Final Clinical Impressions(s) / ED Diagnoses   Final diagnoses:  Acute pain of left knee    Labs:   Imaging: DG knee complete left  Consults:  Therapeutics:  Discharge Meds:   Assessment/Plan: 47 year old female presents today with knee pain.  She has no signs of trauma on exam, no significant findings on plain films.  She is Art[place] seen by  primary care and has a follow-up appointment 2 days with orthopedics.  No additional pain medication will be prescribed as I see no objective findings of injury.  Patient encouraged to follow-up as instructed.      ED Discharge Orders    None       Okey Regal, Hershal Coria 01/13/18 1245    [place], Fredia Sorrow, MD 01/15/18 1452

## 2018-01-13 NOTE — ED Triage Notes (Signed)
Pt states she fell up her steps on Friday which are concrete. She states left knee pain. Denies swelling but reports constat pain. Ambulatory but limp noted.

## 2018-06-02 ENCOUNTER — Emergency Department (HOSPITAL_COMMUNITY): Payer: Medicaid Other

## 2018-06-02 ENCOUNTER — Emergency Department (HOSPITAL_COMMUNITY)
Admission: EM | Admit: 2018-06-02 | Discharge: 2018-06-03 | Disposition: A | Discharge status: Home / Self Care | Payer: Medicaid Other | Attending: Emergency Medicine | Admitting: Emergency Medicine

## 2018-06-02 ENCOUNTER — Encounter (HOSPITAL_COMMUNITY): Payer: Self-pay

## 2018-06-02 DIAGNOSIS — F17210 Nicotine dependence, cigarettes, uncomplicated: Secondary | ICD-10-CM | POA: Diagnosis not present

## 2018-06-02 DIAGNOSIS — Z79899 Other long term (current) drug therapy: Secondary | ICD-10-CM | POA: Diagnosis not present

## 2018-06-02 DIAGNOSIS — J45909 Unspecified asthma, uncomplicated: Secondary | ICD-10-CM | POA: Diagnosis not present

## 2018-06-02 DIAGNOSIS — R0789 Other chest pain: Secondary | ICD-10-CM | POA: Diagnosis not present

## 2018-06-02 LAB — I-STAT TROPONIN, ED: TROPONIN I, POC: 0 ng/mL (ref 0.00–0.08)

## 2018-06-02 LAB — I-STAT BETA HCG BLOOD, ED (MC, WL, AP ONLY)

## 2018-06-02 NOTE — ED Triage Notes (Signed)
Pt reports that central CP began yesterday with radiation to L arm and diaphoresis, denies n/v/sob

## 2018-06-03 ENCOUNTER — Emergency Department (HOSPITAL_COMMUNITY): Payer: Medicaid Other

## 2018-06-03 LAB — BASIC METABOLIC PANEL
ANION GAP: 8 (ref 5–15)
BUN: <5 mg/dL — ABNORMAL LOW (ref 6–20)
CO2: 26 mmol/L (ref 22–32)
Calcium: 9 mg/dL (ref 8.9–10.3)
Chloride: 107 mmol/L (ref 98–111)
Creatinine, Ser: 0.88 mg/dL (ref 0.44–1.00)
Glucose, Bld: 99 mg/dL (ref 70–99)
POTASSIUM: 3.5 mmol/L (ref 3.5–5.1)
Sodium: 141 mmol/L (ref 135–145)

## 2018-06-03 LAB — CBC
HEMATOCRIT: 32 % — AB (ref 36.0–46.0)
HEMOGLOBIN: 11.2 g/dL — AB (ref 12.0–15.0)
MCH: 28.1 pg (ref 26.0–34.0)
MCHC: 35 g/dL (ref 30.0–36.0)
MCV: 80.2 fL (ref 78.0–100.0)
Platelets: 103 10*3/uL — ABNORMAL LOW (ref 150–400)
RBC: 3.99 MIL/uL (ref 3.87–5.11)
RDW: 15.1 % (ref 11.5–15.5)
WBC: 10 10*3/uL (ref 4.0–10.5)

## 2018-06-03 LAB — I-STAT TROPONIN, ED: TROPONIN I, POC: 0.05 ng/mL (ref 0.00–0.08)

## 2018-06-03 LAB — D-DIMER, QUANTITATIVE: D-Dimer, Quant: 0.48 ug/mL-FEU (ref 0.00–0.50)

## 2018-06-03 LAB — TROPONIN I: Troponin I: <0.03 ng/mL (ref <0.03)

## 2018-06-03 MED ORDER — NAPROXEN 500 MG PO TABS: 500.0000 mg | ORAL_TABLET | Freq: Two times a day (BID) | ORAL | 0 refills | Status: DC

## 2018-06-03 MED ORDER — IOPAMIDOL (ISOVUE-370) INJECTION 76%
100.0000 mL | Freq: Once | INTRAVENOUS | Status: AC | PRN
  Administered 2018-06-03: 100 mL via INTRAVENOUS

## 2018-06-03 MED ORDER — DIAZEPAM 5 MG PO TABS
5.0000 mg | ORAL_TABLET | Freq: Once | ORAL | Status: AC
  Administered 2018-06-03: 5 mg via ORAL
  Filled 2018-06-03: qty 1

## 2018-06-03 MED ORDER — KETOROLAC TROMETHAMINE 30 MG/ML IJ SOLN
30.0000 mg | Freq: Once | INTRAMUSCULAR | Status: AC
  Administered 2018-06-03: 30 mg via INTRAVENOUS
  Filled 2018-06-03: qty 1

## 2018-06-03 MED ORDER — METHOCARBAMOL 500 MG PO TABS: 500.0000 mg | ORAL_TABLET | Freq: Two times a day (BID) | ORAL | 0 refills | Status: DC

## 2018-06-03 NOTE — ED Provider Notes (Addendum)
[facility] EMERGENCY DEPARTMENT Provider Note   CSN: [id number] Arrival date & time: 06/02/18  2227     History   Chief Complaint Chief Complaint  Patient presents with  . Chest Pain    HPI Tina Mayo is a 47 y.o. female.  Patient presents with left sided chest and shoulder pain that has been constant since yesterday that radiates into her left arm.  She reports the pain is worse with movement and palpation.  Denies any specific injury but she does work in housekeeping.  She denies any shortness of breath, nausea, vomiting, cough or fever.  She denies any numbness or tingling in her arm.  She does have some weakness but she believes this is due to pain.  Denies any back pain.  He complains of low back pain over the same duration that is gradual in onset.  With no focal weakness, numbness or tingling.  No bowel or bladder incontinence.  No fever or vomiting.  Denies any cardiac history.  The history is provided by the patient.  Chest Pain   Associated symptoms include back pain. Pertinent negatives include no abdominal pain, no dizziness, no fever, no headaches, no nausea, no shortness of breath, no vomiting and no weakness.    Past Medical History:  Diagnosis Date  . Anxiety   . Asthma   . Bell's palsy   . Depression   . Sickle cell trait [facility])     Patient Active Problem List   Diagnosis Date Noted  . Trichimoniasis 05/03/2015  . Pap smear for cervical cancer screening 05/02/2015  . Perimenopausal 05/02/2015  . Vaginal discharge 05/02/2015  . Bell's palsy 02/28/2015  . Insomnia 12/26/2013  . Cystic breast 12/26/2013  . Tobacco abuse 09/21/2013  . Chest pain 08/26/2013  . Thrombocytopenia, unspecified ([place]) 08/26/2013  . Cardiomegaly 08/26/2013  . Sickle cell trait ([place]) 08/26/2013  . Abnormal CT of the chest 08/26/2013  . Depression 08/26/2013  . Anemia, unspecified 08/26/2013    Past Surgical History:  Procedure Laterality Date  .  benign breast tumor removed     2006  . BREAST BIOPSY Right 2014   benign  . BREAST EXCISIONAL BIOPSY Right 2007   benign   . right ankle surgery     2010  . TUBAL LIGATION       OB History   None      Home Medications    Prior to Admission medications   Medication Sig Start Date End Date Taking? Authorizing Provider  acetaminophen (TYLENOL) 500 MG tablet Take 500 mg by mouth every 6 (six) hours as needed for moderate pain or headache.     [provider]  ARIPiprazole (ABILIFY) 15 MG tablet Take 15 mg by mouth at bedtime. 01/23/17   [provider]  cephALEXin (KEFLEX) 500 MG capsule Take 1 capsule (500 mg total) by mouth 3 (three) times daily. 12/16/17   Duanne Guess, PA-C  chlorhexidine (PERIDEX) 0.12 % solution Use as directed 15 mLs in the mouth or throat 2 (two) times daily. 09/21/17   Robyn Haber, MD  HYDROcodone-acetaminophen (NORCO) 5-325 MG tablet Take 1 tablet by mouth every 4 (four) hours as needed for moderate pain. 12/16/17   Duanne Guess, PA-C  ibuprofen (ADVIL,MOTRIN) 200 MG tablet Take 800 mg by mouth every 6 (six) hours as needed for headache or moderate pain.     [provider]  sertraline (ZOLOFT) 25 MG tablet Take 25 mg by mouth at bedtime.  01/23/17   [provider]    Family History Family History  Problem Relation Age of Onset  . Breast cancer Mother   . Cancer Mother   . Diabetes Mellitus II Father   . Diabetes Father   . Breast cancer Maternal Grandmother     Social History Social History   Tobacco Use  . Smoking status: Current Every Day Smoker    Packs/day: 0.10    Types: Cigarettes  . Smokeless tobacco: Current User  Substance Use Topics  . Alcohol use: No  . Drug use: No     Allergies   Levaquin [levofloxacin in d5w] and Penicillins   Review of Systems Review of Systems  Constitutional: Negative for activity change, appetite change and fever.  HENT: Negative for congestion and  rhinorrhea.   Eyes: Negative for visual disturbance.  Respiratory: Positive for chest tightness. Negative for shortness of breath.   Cardiovascular: Positive for chest pain.  Gastrointestinal: Negative for abdominal pain, nausea and vomiting.  Genitourinary: Negative for dysuria and hematuria.  Musculoskeletal: Positive for arthralgias, back pain and myalgias. Negative for neck pain and neck stiffness.  Neurological: Negative for dizziness, weakness, light-headedness and headaches.  Psychiatric/Behavioral: The patient is not nervous/anxious.     all other systems are negative except as noted in the HPI and PMH.    Physical Exam Updated Vital Signs BP 114/78   Pulse 78   Temp 97.8 F (36.6 C)   Resp 18   LMP 01/12/2017   SpO2 98%   Physical Exam  Constitutional: She is oriented to person, place, and time. She appears well-developed and well-nourished. No distress.  HENT:  Head: Normocephalic and atraumatic.  Mouth/Throat: Oropharynx is clear and moist. No oropharyngeal exudate.  Eyes: Pupils are equal, round, and reactive to light. Conjunctivae and EOM are normal.  Neck: Normal range of motion. Neck supple.  No meningismus.  Cardiovascular: Normal rate, regular rhythm, normal heart sounds and intact distal pulses.  No murmur heard. Pulmonary/Chest: Effort normal and breath sounds normal. No respiratory distress. She exhibits tenderness.  Abdominal: Soft. There is no tenderness. There is no rebound and no guarding.  Musculoskeletal: Normal range of motion. She exhibits tenderness. She exhibits no edema.  Pain to palpation of anterior chest and shoulder as well as lateral shoulder.  There is pain with range of motion of left shoulder.  Intact radial pulse.  Grip strength slightly decreased on the left.  Cardinal hand movements intact. Right paraspinal lumbar tenderness. 5/5 strength in lower extremities.  Intact DP and PT pulses.  Neurological: She is alert and oriented to person,  place, and time. A cranial nerve deficit is present. She exhibits normal muscle tone. Coordination normal.   5/5 strength throughout. CN 2-12 intact.Equal grip strength.  Chronic R facial droop from Bell's palsy per patient  Skin: Skin is warm.  Psychiatric: She has a normal mood and affect. Her behavior is normal.  Nursing note and vitals reviewed.    ED Treatments / Results  Labs (all labs ordered are listed, but only abnormal results are displayed) Labs Reviewed  BASIC METABOLIC PANEL - Abnormal; Notable for the following components:      Result Value   BUN <5 (*)    All other components within normal limits  CBC - Abnormal; Notable for the following components:   Hemoglobin 11.2 (*)    HCT 32.0 (*)    Platelets 103 (*)    All other components within normal limits  D-DIMER, QUANTITATIVE (  NOT AT [facility])  TROPONIN I  I-STAT TROPONIN, ED  I-STAT BETA HCG BLOOD, ED ([facility], [facility], [facility] ONLY)  I-STAT TROPONIN, ED    EKG EKG Interpretation  Date/Time:  Tuesday June 02 2018 22:32:00 EDT Ventricular Rate:  95 PR Interval:  158 QRS Duration: 78 QT Interval:  376 QTC Calculation: 472 R Axis:   30 Text Interpretation:  Normal sinus rhythm Nonspecific T wave abnormality Prolonged QT Abnormal ECG No significant change was found Confirmed by Ezequiel Essex [phone]) on 06/03/2018 1:24:48 AM   Radiology Dg Chest 2 View  Result Date: 06/02/2018 CLINICAL DATA:  Chest pain EXAM: CHEST - 2 VIEW COMPARISON:  03/18/2016 chest radiograph. FINDINGS: Stable cardiomediastinal silhouette with normal heart size. No pneumothorax. No pleural effusion. Lungs appear clear, with no acute consolidative airspace disease and no pulmonary edema. IMPRESSION: No active cardiopulmonary disease. Electronically Signed   By: Ilona Sorrel M.D.   On: 06/02/2018 23:22   Dg Cervical Spine Complete  Result Date: 06/03/2018 CLINICAL DATA:  47 year old female with left arm pain. EXAM: CERVICAL SPINE - COMPLETE 4+ VIEW  COMPARISON:  None. FINDINGS: There is no acute fracture or subluxation of the cervical spine. Evaluation of C5 and C6 on lateral views is limited due to superimposition of the soft tissues. The visualized posterior elements and the odontoid appear intact. There is anatomic alignment of the lateral masses of C1 and C2. There is degenerative changes and neural foramina narrowing primarily at C2 -C3 and C3-C4. MRI may provide better evaluation. The soft tissues appear unremarkable. IMPRESSION: 1. No acute cervical spine pathology. 2. Degenerative changes with narrowing of the neural foramina at C2-C3 and C3-C4. MRI may provide better evaluation. Electronically Signed   By: Anner Crete M.D.   On: 06/03/2018 02:42   Ct Angio Chest/abd/pel For Dissection W And/or Wo Contrast  Result Date: 06/03/2018 CLINICAL DATA:  47 y/o F; left-sided chest pain and left shoulder pain since yesterday. Patient also having right flank pain. History of sickle cell trait. EXAM: CT ANGIOGRAPHY CHEST, ABDOMEN AND PELVIS TECHNIQUE: Multidetector CT imaging through the chest, abdomen and pelvis was performed using the standard protocol during bolus administration of intravenous contrast. Multiplanar reconstructed images and MIPs were obtained and reviewed to evaluate the vascular anatomy. CONTRAST:  155mL ISOVUE-370 IOPAMIDOL (ISOVUE-370) INJECTION 76% COMPARISON:  08/25/2013 CT angiogram of the chest. FINDINGS: CTA CHEST FINDINGS Cardiovascular: Preferential opacification of the thoracic aorta. Satisfactory opacification of the pulmonary arteries, no pulmonary embolus identified. No evidence of thoracic aortic aneurysm or dissection. Normal heart size. No pericardial effusion. Mediastinum/Nodes: No lymphadenopathy by imaging criteria. Stable mild prominence of hilar lymph nodes. Thyroid gland, trachea, and esophagus demonstrate no significant findings. Lungs/Pleura: Stable ground-glass opacities within lung bases in small peripheral  areas of scarring. No consolidation, effusion, or pneumothorax. Musculoskeletal: No acute fracture. Avascular necrosis of the humeral heads without fracture or subchondral collapse. Review of the MIP images confirms the above findings. CTA ABDOMEN AND PELVIS FINDINGS VASCULAR Aorta: Normal caliber aorta without aneurysm, dissection, vasculitis or significant stenosis. Celiac: Patent without evidence of aneurysm, dissection, vasculitis or significant stenosis. SMA: Patent without evidence of aneurysm, dissection, vasculitis or significant stenosis. Renals: Both renal arteries are patent without evidence of aneurysm, dissection, vasculitis, fibromuscular dysplasia or significant stenosis. IMA: Patent without evidence of aneurysm, dissection, vasculitis or significant stenosis. Inflow: Patent without evidence of aneurysm, dissection, vasculitis or significant stenosis. Veins: No obvious venous abnormality within the limitations of this arterial phase study. Review of the MIP images  confirms the above findings. NON-VASCULAR Hepatobiliary: No focal liver abnormality is seen. Cholelithiasis. No gallbladder wall thickening or biliary ductal dilatation. Pancreas: Unremarkable. No pancreatic ductal dilatation or surrounding inflammatory changes. Spleen: Spleen measures 12.4 x 6.4 x 10.1 cm (volume = 420 cm^3). Adrenals/Urinary Tract: Adrenal glands are unremarkable. Kidneys are normal, without renal calculi, focal lesion, or hydronephrosis. Bladder is unremarkable. Stomach/Bowel: Stomach is within normal limits. Appendix appears normal. No evidence of bowel wall thickening, distention, or inflammatory changes. Lymphatic: No significant vascular findings are present. No enlarged abdominal or pelvic lymph nodes. Reproductive: Uterus and bilateral adnexa are unremarkable. Other: No abdominal wall hernia or abnormality. No abdominopelvic ascites. Musculoskeletal: No fracture is seen. Avascular necrosis of the femoral heads  without deformity or subchondral collapse. Review of the MIP images confirms the above findings. IMPRESSION: 1. No acute vascular abnormality of the aorta or branch vessels. 2. No pulmonary embolus. 3. Stable ground-glass opacities within the lung bases. Findings may represent air trapping, mild pneumonitis, or possibly sickle cell chronic lung disease. No consolidation. 4. Cholelithiasis. 5. 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 6. Splenomegaly, 420 cc. Electronically Signed   By: Kristine Garbe M.D.   On: 06/03/2018 04:47    Procedures Procedures (including critical care time)  Medications Ordered in ED Medications  diazepam (VALIUM) tablet 5 mg (5 mg Oral Given 06/03/18 0200)  ketorolac (TORADOL) 30 MG/ML injection 30 mg (30 mg Intravenous Given 06/03/18 0153)     Initial Impression / Assessment and Plan / ED Course  I have reviewed the triage vital signs and the nursing notes.  Pertinent labs & imaging results that were available during my care of the patient were reviewed by me and considered in my medical decision making (see chart for details).    Patient with left anterior and lateral shoulder and chest pain that is worse with palpation.  Her EKG is nonischemic.  Troponin negative x2, d-dimer negative Pain has improved with Valium and Toradol.  Low suspicion for ACS or pulmonary embolism.   Work-up is reassuring.  There is no evidence of pulmonary embolism, aortic dissection, ACS.  Patient's pain is improved with treatment in the ED.  We will treat supportively with anti-inflammatories and muscle relaxers. Follow-up with PCP.  Return precautions discussed.   Final Clinical Impressions(s) / ED Diagnoses   Final diagnoses:  Atypical chest pain    ED Discharge Orders    None       Idali Lafever, Annie Main, MD 06/03/18 [id number]    Ezequiel Essex, MD 06/03/18 [phone]

## 2018-06-03 NOTE — ED Notes (Signed)
Patient transported to X-ray 

## 2018-06-03 NOTE — ED Notes (Signed)
ED Provider at bedside. 

## 2018-06-03 NOTE — Discharge Instructions (Addendum)
There is no evidence of heart attack or blood clot in the lung.  Take the anti-inflammatories as prescribed.  Follow-up with your doctor.  Return to the ED if you develop new or worsening symptoms.

## 2018-06-03 NOTE — ED Notes (Signed)
Pt returned from CT °

## 2018-06-25 ENCOUNTER — Ambulatory Visit (HOSPITAL_COMMUNITY)
Admission: EM | Admit: 2018-06-25 | Discharge: 2018-06-25 | Disposition: A | Discharge status: Home / Self Care | Payer: Medicaid Other | Attending: Family Medicine | Admitting: Family Medicine

## 2018-06-25 ENCOUNTER — Encounter (HOSPITAL_COMMUNITY): Payer: Self-pay

## 2018-06-25 DIAGNOSIS — H1031 Unspecified acute conjunctivitis, right eye: Secondary | ICD-10-CM | POA: Diagnosis not present

## 2018-06-25 MED ORDER — TETRACAINE HCL 0.5 % OP SOLN
OPHTHALMIC | Status: AC
  Filled 2018-06-25: qty 4

## 2018-06-25 MED ORDER — POLYMYXIN B-TRIMETHOPRIM 10000-0.1 UNIT/ML-% OP SOLN: 1.0000 [drp] | OPHTHALMIC | 0 refills | Status: AC

## 2018-06-25 MED ORDER — FLUORESCEIN SODIUM 1 MG OP STRP
ORAL_STRIP | OPHTHALMIC | Status: AC
  Filled 2018-06-25: qty 1

## 2018-06-25 NOTE — Discharge Instructions (Signed)
Conjunctivitis - Polytrim drops every 4-6 hours - Have Good Hand Hygiene  - Use Cold Compresses  - Artificial Tears 5-6 times a day  - Use Naphcon-A for redness and itching relief   Please return if symptoms worsening, developing eye pain, worsening swelling around her eye, vision changes, or persistent symptoms

## 2018-06-25 NOTE — ED Triage Notes (Signed)
Pt presents with possible pink eye

## 2018-06-25 NOTE — ED Provider Notes (Signed)
[place]    CSN: [id number] Arrival date & time: 06/25/18  1217     History   Chief Complaint Chief Complaint  Patient presents with  . Conjunctivitis    right eye    HPI Tina Mayo is a 47 y.o. female history of tobacco use, sickle cell trait presenting today for evaluation of right eye irritation and drainage.  She states that since Monday she has developed some pustular-like drainage from the corner of her eye, she is also noted some swelling.  States that the swelling has improved significantly, but the irritation and drainage persists.  She denies any vision changes.  She does not wear contacts, but does wear glasses.  Denies eye pain.  She has used over-the-counter eyedrops without relief.  HPI  Past Medical History:  Diagnosis Date  . Anxiety   . Asthma   . Bell's palsy   . Depression   . Sickle cell trait [facility])     Patient Active Problem List   Diagnosis Date Noted  . Trichimoniasis 05/03/2015  . Pap smear for cervical cancer screening 05/02/2015  . Perimenopausal 05/02/2015  . Vaginal discharge 05/02/2015  . Bell's palsy 02/28/2015  . Insomnia 12/26/2013  . Cystic breast 12/26/2013  . Tobacco abuse 09/21/2013  . Chest pain 08/26/2013  . Thrombocytopenia, unspecified ([place]) 08/26/2013  . Cardiomegaly 08/26/2013  . Sickle cell trait ([place]) 08/26/2013  . Abnormal CT of the chest 08/26/2013  . Depression 08/26/2013  . Anemia, unspecified 08/26/2013    Past Surgical History:  Procedure Laterality Date  . benign breast tumor removed     2006  . BREAST BIOPSY Right 2014   benign  . BREAST EXCISIONAL BIOPSY Right 2007   benign   . right ankle surgery     2010  . TUBAL LIGATION      OB History   None      Home Medications    Prior to Admission medications   Medication Sig Start Date End Date Taking? Authorizing Provider  acetaminophen (TYLENOL) 500 MG tablet Take 500 mg by mouth every 6 (six) hours as needed for moderate pain  or headache.     [provider]  ARIPiprazole (ABILIFY) 15 MG tablet Take 15 mg by mouth at bedtime. 01/23/17   [provider]  cephALEXin (KEFLEX) 500 MG capsule Take 1 capsule (500 mg total) by mouth 3 (three) times daily. 12/16/17   Duanne Guess, PA-C  chlorhexidine (PERIDEX) 0.12 % solution Use as directed 15 mLs in the mouth or throat 2 (two) times daily. 09/21/17   Robyn Haber, MD  HYDROcodone-acetaminophen (NORCO) 5-325 MG tablet Take 1 tablet by mouth every 4 (four) hours as needed for moderate pain. 12/16/17   Duanne Guess, PA-C  ibuprofen (ADVIL,MOTRIN) 200 MG tablet Take 800 mg by mouth every 6 (six) hours as needed for headache or moderate pain.     [provider]  methocarbamol (ROBAXIN) 500 MG tablet Take 1 tablet (500 mg total) by mouth 2 (two) times daily. 06/03/18   Rancour, Annie Main, MD  naproxen (NAPROSYN) 500 MG tablet Take 1 tablet (500 mg total) by mouth 2 (two) times daily. 06/03/18   Rancour, Annie Main, MD  sertraline (ZOLOFT) 25 MG tablet Take 25 mg by mouth at bedtime.  01/23/17   [provider]  trimethoprim-polymyxin b (POLYTRIM) ophthalmic solution Place 1 drop into the right eye every 4 (four) hours for 7 days. 06/25/18 07/02/18  Wieters, Elesa Hacker, PA-C  Family History Family History  Problem Relation Age of Onset  . Breast cancer Mother   . Cancer Mother   . Diabetes Mellitus II Father   . Diabetes Father   . Breast cancer Maternal Grandmother     Social History Social History   Tobacco Use  . Smoking status: Current Every Day Smoker    Packs/day: 0.10    Types: Cigarettes  . Smokeless tobacco: Current User  Substance Use Topics  . Alcohol use: No  . Drug use: No     Allergies   Levaquin [levofloxacin in d5w] and Penicillins   Review of Systems Review of Systems  Constitutional: Negative for activity change, appetite change, chills, fatigue and fever.  HENT: Negative for congestion, ear pain,  rhinorrhea, sinus pressure, sore throat and trouble swallowing.   Eyes: Positive for discharge, redness and itching. Negative for photophobia, pain and visual disturbance.  Respiratory: Negative for cough, chest tightness and shortness of breath.   Cardiovascular: Negative for chest pain.  Gastrointestinal: Negative for abdominal pain, diarrhea, nausea and vomiting.  Musculoskeletal: Negative for myalgias.  Skin: Negative for rash.  Neurological: Negative for dizziness, light-headedness and headaches.     Physical Exam Triage Vital Signs ED Triage Vitals  Enc Vitals Group     BP 06/25/18 1311 115/74     Pulse Rate 06/25/18 1311 74     Resp 06/25/18 1311 20     Temp 06/25/18 1311 98.7 F (37.1 C)     Temp Source 06/25/18 1311 Temporal     SpO2 06/25/18 1311 100 %     Weight --      Height --      Head Circumference --      Peak Flow --      Pain Score 06/25/18 1310 0     Pain Loc --      Pain Edu? --      Excl. in [place]? --    No data found.  Updated Vital Signs BP 115/74 (BP Location: Left Arm)   Pulse 74   Temp 98.7 F (37.1 C) (Temporal)   Resp 20   LMP 01/12/2017   SpO2 100%   Visual Acuity-without patient's corrective glasses Right Eye Distance:  20/30 Left Eye Distance:  20/40 Bilateral Distance:  20/25  Right Eye Near:   Left Eye Near:    Bilateral Near:     Physical Exam  Constitutional: She appears well-developed and well-nourished. No distress.  HENT:  Head: Normocephalic and atraumatic.  Bilateral ears without tenderness to palpation of external auricle, tragus and mastoid, EAC's without erythema or swelling, TM's with good bony landmarks and cone of light. Non erythematous.  Oral mucosa pink and moist, no tonsillar enlargement or exudate. Posterior pharynx patent and nonerythematous, no uvula deviation or swelling. Normal phonation.  Eyes: Pupils are equal, round, and reactive to light. Conjunctivae and EOM are normal.  Right eye:, Conjunctiva mildly  erythematous, no obvious discharge seen, small swelling to lower lid near medial canthus, possible previous hordeolum, no uptake, abrasion or ulcer seen with fluorescein staining  Neck: Neck supple.  Cardiovascular: Normal rate and regular rhythm.  No murmur heard. Pulmonary/Chest: Effort normal and breath sounds normal. No respiratory distress.  Abdominal: Soft. There is no tenderness.  Musculoskeletal: She exhibits no edema.  Neurological: She is alert.  Skin: Skin is warm and dry.  Psychiatric: She has a normal mood and affect.  Nursing note and vitals reviewed.    UC Treatments / Results  Labs (all labs ordered are listed, but only abnormal results are displayed) Labs Reviewed - No data to display  EKG None  Radiology No results found.  Procedures Procedures (including critical care time)  Medications Ordered in UC Medications - No data to display  Initial Impression / Assessment and Plan / UC Course  I have reviewed the triage vital signs and the nursing notes.  Pertinent labs & imaging results that were available during my care of the patient were reviewed by me and considered in my medical decision making (see chart for details).     Patient with conjunctivitis (viral versus bacterial), versus resolving hordeolum.  Will provide Polytrim to treat for possible bacterial conjunctivitis given exposure to grandchildren recently, will also advised to apply cold compresses to help with swelling and discomfort.  Follow-up if having changes in vision, worsening swelling or developing eye pain.Discussed strict return precautions. Patient verbalized understanding and is agreeable with plan.  Final Clinical Impressions(s) / UC Diagnoses   Final diagnoses:  Acute bacterial conjunctivitis of right eye     Discharge Instructions     Conjunctivitis - Polytrim drops every 4-6 hours - Have Good Hand Hygiene  - Use Cold Compresses  - Artificial Tears 5-6 times a day  - Use  Naphcon-A for redness and itching relief   Please return if symptoms worsening, developing eye pain, worsening swelling around her eye, vision changes, or persistent symptoms    ED Prescriptions    Medication Sig Dispense Auth. Provider   trimethoprim-polymyxin b (POLYTRIM) ophthalmic solution Place 1 drop into the right eye every 4 (four) hours for 7 days. 10 mL Wieters, Hallie C, PA-C     Controlled Substance Prescriptions [place] Controlled Substance Registry consulted? Not Applicable   Janith Lima, [place] 06/26/18 [phone]

## 2018-09-03 ENCOUNTER — Other Ambulatory Visit: Payer: Self-pay | Admitting: Internal Medicine

## 2018-09-03 DIAGNOSIS — Z1231 Encounter for screening mammogram for malignant neoplasm of breast: Secondary | ICD-10-CM

## 2018-09-29 ENCOUNTER — Ambulatory Visit: Payer: Self-pay

## 2018-11-12 ENCOUNTER — Ambulatory Visit: Payer: Self-pay

## 2018-12-07 ENCOUNTER — Ambulatory Visit
Admission: EM | Admit: 2018-12-07 | Discharge: 2018-12-07 | Disposition: A | Discharge status: Home / Self Care | Payer: Medicaid Other

## 2018-12-07 DIAGNOSIS — M25511 Pain in right shoulder: Secondary | ICD-10-CM

## 2018-12-07 DIAGNOSIS — M25571 Pain in right ankle and joints of right foot: Secondary | ICD-10-CM | POA: Diagnosis not present

## 2018-12-07 DIAGNOSIS — G8929 Other chronic pain: Secondary | ICD-10-CM | POA: Diagnosis not present

## 2018-12-07 DIAGNOSIS — M1990 Unspecified osteoarthritis, unspecified site: Secondary | ICD-10-CM | POA: Insufficient documentation

## 2018-12-07 MED ORDER — MELOXICAM 15 MG PO TABS: 15.0000 mg | ORAL_TABLET | Freq: Every day | ORAL | 0 refills | Status: DC

## 2018-12-07 MED ORDER — TRAMADOL HCL 50 MG PO TABS: 50.0000 mg | ORAL_TABLET | Freq: Four times a day (QID) | ORAL | 0 refills | Status: DC | PRN

## 2018-12-07 NOTE — ED Triage Notes (Signed)
Pt presents with complaints of pain in her right ankle. Reports previous surgery on this ankle. Denies any injury. Patient also complaints of pain in her right shoulder that she does have some "deterioration" in. Pt reports symptoms since Saturday.

## 2018-12-07 NOTE — Discharge Instructions (Signed)
We will go ahead and treat with meloxicam for the pain inflammation I am also giving you some stronger pain medicine to take for worsening pain Follow-up with your doctor as planned to get referral to orthopedic for further evaluation and management Rest, ice, elevate

## 2018-12-08 NOTE — ED Provider Notes (Signed)
[name]    CSN: [id number] Arrival date & time: 12/07/18  1254     History   Chief Complaint Chief Complaint  Patient presents with  . Ankle Pain    HPI Tina Mayo is a 47 y.o. female.   Pt is a 47 year old female with PMH of anxiety, bells palsy, asthma, depression. She presents with chronic right ankle and right shoulder pain. This has been giving her more problems over the past month. She has had previous surgery on the ankle on the lateral side. She is hurting more on the medial side. She does a lot of standing long hours at work at times. Her shoulder has shown degenerative changes in the past and she admits she needs surgery at some point. She has been taking ibuprofen around the clock without relief of symptoms. She has tramadol that she can take to help sleep at night but she is out. She denies any numbness, tingling. No fever chills or new injuries.   ROS per HPI    Ankle Pain    Past Medical History:  Diagnosis Date  . Anxiety   . Asthma   . Bell's palsy   . Depression   . Sickle cell trait [facility])     Patient Active Problem List   Diagnosis Date Noted  . Trichimoniasis 05/03/2015  . Pap smear for cervical cancer screening 05/02/2015  . Perimenopausal 05/02/2015  . Vaginal discharge 05/02/2015  . Bell's palsy 02/28/2015  . Insomnia 12/26/2013  . Cystic breast 12/26/2013  . Tobacco abuse 09/21/2013  . Chest pain 08/26/2013  . Thrombocytopenia, unspecified ([place]) 08/26/2013  . Cardiomegaly 08/26/2013  . Sickle cell trait ([place]) 08/26/2013  . Abnormal CT of the chest 08/26/2013  . Depression 08/26/2013  . Anemia, unspecified 08/26/2013    Past Surgical History:  Procedure Laterality Date  . benign breast tumor removed     2006  . BREAST BIOPSY Right 2014   benign  . BREAST EXCISIONAL BIOPSY Right 2007   benign   . right ankle surgery     2010  . TUBAL LIGATION      OB History   No obstetric history on file.      Home  Medications    Prior to Admission medications   Medication Sig Start Date End Date Taking? Authorizing Provider  acetaminophen (TYLENOL) 500 MG tablet Take 500 mg by mouth every 6 (six) hours as needed for moderate pain or headache.     [provider]  ARIPiprazole (ABILIFY) 15 MG tablet Take 15 mg by mouth at bedtime. 01/23/17   [provider]  ibuprofen (ADVIL,MOTRIN) 200 MG tablet Take 800 mg by mouth every 6 (six) hours as needed for headache or moderate pain.     [provider]  meloxicam (MOBIC) 15 MG tablet Take 1 tablet (15 mg total) by mouth daily. 12/07/18   Loura Halt A, NP  sertraline (ZOLOFT) 25 MG tablet Take 25 mg by mouth at bedtime.  01/23/17   [provider]  traMADol (ULTRAM) 50 MG tablet Take 1 tablet (50 mg total) by mouth every 6 (six) hours as needed. 12/07/18   Orvan July, NP    Family History Family History  Problem Relation Age of Onset  . Breast cancer Mother   . Cancer Mother   . Diabetes Mellitus II Father   . Diabetes Father   . Breast cancer Maternal Grandmother     Social History Social History  Tobacco Use  . Smoking status: Current Every Day Smoker    Packs/day: 0.10    Types: Cigarettes  . Smokeless tobacco: Current User  Substance Use Topics  . Alcohol use: No  . Drug use: No     Allergies   Levaquin [levofloxacin in d5w] and Penicillins   Review of Systems Review of Systems   Physical Exam Triage Vital Signs ED Triage Vitals  Enc Vitals Group     BP 12/07/18 1448 116/79     Pulse Rate 12/07/18 1448 87     Resp 12/07/18 1448 19     Temp 12/07/18 1448 98.3 F (36.8 C)     Temp src --      SpO2 12/07/18 1448 98 %     Weight --      Height --      Head Circumference --      Peak Flow --      Pain Score 12/07/18 1446 8     Pain Loc --      Pain Edu? --      Excl. in [place]? --    No data found.  Updated Vital Signs BP 116/79   Pulse 87   Temp 98.3 F (36.8 C)   Resp 19   LMP  01/12/2017   SpO2 98%   Visual Acuity Right Eye Distance:   Left Eye Distance:   Bilateral Distance:    Right Eye Near:   Left Eye Near:    Bilateral Near:     Physical Exam Vitals signs and nursing note reviewed.  Constitutional:      Appearance: Normal appearance.  HENT:     Head: Normocephalic and atraumatic.     Nose: Nose normal.  Cardiovascular:     Pulses: Normal pulses.  Pulmonary:     Effort: Pulmonary effort is normal.  Musculoskeletal:        General: Tenderness present. No swelling.     Comments: Scar to the right lateral ankle where previous surgery was. No tenderness there. Tenderness to palpation of the medial ankle just above the malleolus. Mild swelling  Tenderness to the right posterior shoulder. Non tender to the [facility] joint, glenohumeral joint and bicipital groove. Decent ROM with pain. No swelling, deformities.   Skin:    General: Skin is warm and dry.  Neurological:     Mental Status: She is alert.      UC Treatments / Results  Labs (all labs ordered are listed, but only abnormal results are displayed) Labs Reviewed - No data to display  EKG None  Radiology No results found.  Procedures Procedures (including critical care time)  Medications Ordered in UC Medications - No data to display  Initial Impression / Assessment and Plan / UC Course  I have reviewed the triage vital signs and the nursing notes.  Pertinent labs & imaging results that were available during my care of the patient were reviewed by me and considered in my medical decision making (see chart for details).     Chronic pain of the right shoulder and right ankle pain Will try meloxicam for the pain and inflammation Tramadol for more severe pain and to help sleep at night.  [facility] follow up if not better in a few weeks.  Final Clinical Impressions(s) / UC Diagnoses   Final diagnoses:  Arthritis     Discharge Instructions     We will go ahead and treat with  meloxicam for the pain inflammation I  am also giving you some stronger pain medicine to take for worsening pain Follow-up with your doctor as planned to get referral to orthopedic for further evaluation and management Rest, ice, elevate    ED Prescriptions    Medication Sig Dispense Auth. Provider   meloxicam (MOBIC) 15 MG tablet Take 1 tablet (15 mg total) by mouth daily. 30 tablet Amore Grater A, NP   traMADol (ULTRAM) 50 MG tablet Take 1 tablet (50 mg total) by mouth every 6 (six) hours as needed. 15 tablet Loura Halt A, NP     Controlled Substance Prescriptions  Controlled Substance Registry consulted? Yes   Orvan July, NP 12/08/18 [phone]

## 2018-12-14 ENCOUNTER — Ambulatory Visit (INDEPENDENT_AMBULATORY_CARE_PROVIDER_SITE_OTHER)
Admission: EM | Admit: 2018-12-14 | Discharge: 2018-12-14 | Disposition: A | Discharge status: Home / Self Care | Payer: Medicaid Other | Source: Home / Self Care | Attending: Family Medicine | Admitting: Family Medicine

## 2018-12-14 ENCOUNTER — Inpatient Hospital Stay (HOSPITAL_COMMUNITY)
Admission: EM | Admit: 2018-12-14 | Discharge: 2018-12-18 | DRG: 419 | Disposition: A | Discharge status: Home / Self Care | Payer: Medicaid Other | Attending: Internal Medicine | Admitting: Internal Medicine

## 2018-12-14 ENCOUNTER — Ambulatory Visit: Payer: Medicaid Other

## 2018-12-14 ENCOUNTER — Encounter: Payer: Self-pay | Admitting: Emergency Medicine

## 2018-12-14 ENCOUNTER — Encounter (HOSPITAL_COMMUNITY): Payer: Self-pay | Admitting: Emergency Medicine

## 2018-12-14 ENCOUNTER — Emergency Department (HOSPITAL_COMMUNITY): Payer: Medicaid Other

## 2018-12-14 ENCOUNTER — Other Ambulatory Visit: Payer: Self-pay

## 2018-12-14 DIAGNOSIS — Z6831 Body mass index (BMI) 31.0-31.9, adult: Secondary | ICD-10-CM

## 2018-12-14 DIAGNOSIS — Z791 Long term (current) use of non-steroidal anti-inflammatories (NSAID): Secondary | ICD-10-CM

## 2018-12-14 DIAGNOSIS — K8050 Calculus of bile duct without cholangitis or cholecystitis without obstruction: Secondary | ICD-10-CM | POA: Diagnosis not present

## 2018-12-14 DIAGNOSIS — J45909 Unspecified asthma, uncomplicated: Secondary | ICD-10-CM | POA: Diagnosis present

## 2018-12-14 DIAGNOSIS — K5900 Constipation, unspecified: Secondary | ICD-10-CM

## 2018-12-14 DIAGNOSIS — K660 Peritoneal adhesions (postprocedural) (postinfection): Secondary | ICD-10-CM | POA: Diagnosis present

## 2018-12-14 DIAGNOSIS — R1013 Epigastric pain: Secondary | ICD-10-CM | POA: Diagnosis not present

## 2018-12-14 DIAGNOSIS — F419 Anxiety disorder, unspecified: Secondary | ICD-10-CM | POA: Diagnosis present

## 2018-12-14 DIAGNOSIS — K8042 Calculus of bile duct with acute cholecystitis without obstruction: Principal | ICD-10-CM | POA: Diagnosis present

## 2018-12-14 DIAGNOSIS — D649 Anemia, unspecified: Secondary | ICD-10-CM | POA: Diagnosis present

## 2018-12-14 DIAGNOSIS — Z833 Family history of diabetes mellitus: Secondary | ICD-10-CM

## 2018-12-14 DIAGNOSIS — K5901 Slow transit constipation: Secondary | ICD-10-CM | POA: Diagnosis present

## 2018-12-14 DIAGNOSIS — Z720 Tobacco use: Secondary | ICD-10-CM

## 2018-12-14 DIAGNOSIS — D573 Sickle-cell trait: Secondary | ICD-10-CM | POA: Diagnosis present

## 2018-12-14 DIAGNOSIS — R110 Nausea: Secondary | ICD-10-CM

## 2018-12-14 DIAGNOSIS — Z881 Allergy status to other antibiotic agents status: Secondary | ICD-10-CM

## 2018-12-14 DIAGNOSIS — F329 Major depressive disorder, single episode, unspecified: Secondary | ICD-10-CM | POA: Diagnosis present

## 2018-12-14 DIAGNOSIS — F17210 Nicotine dependence, cigarettes, uncomplicated: Secondary | ICD-10-CM | POA: Diagnosis present

## 2018-12-14 DIAGNOSIS — M25511 Pain in right shoulder: Secondary | ICD-10-CM | POA: Insufficient documentation

## 2018-12-14 DIAGNOSIS — Z880 Allergy status to penicillin: Secondary | ICD-10-CM

## 2018-12-14 DIAGNOSIS — F32A Depression, unspecified: Secondary | ICD-10-CM | POA: Diagnosis present

## 2018-12-14 DIAGNOSIS — Z803 Family history of malignant neoplasm of breast: Secondary | ICD-10-CM

## 2018-12-14 DIAGNOSIS — R109 Unspecified abdominal pain: Secondary | ICD-10-CM

## 2018-12-14 DIAGNOSIS — K8080 Other cholelithiasis without obstruction: Secondary | ICD-10-CM

## 2018-12-14 DIAGNOSIS — R1012 Left upper quadrant pain: Secondary | ICD-10-CM | POA: Insufficient documentation

## 2018-12-14 DIAGNOSIS — G8929 Other chronic pain: Secondary | ICD-10-CM | POA: Insufficient documentation

## 2018-12-14 DIAGNOSIS — R1011 Right upper quadrant pain: Secondary | ICD-10-CM

## 2018-12-14 DIAGNOSIS — R29810 Facial weakness: Secondary | ICD-10-CM | POA: Diagnosis present

## 2018-12-14 DIAGNOSIS — D696 Thrombocytopenia, unspecified: Secondary | ICD-10-CM | POA: Diagnosis not present

## 2018-12-14 LAB — URINALYSIS, ROUTINE W REFLEX MICROSCOPIC
Glucose, UA: NEGATIVE mg/dL
Hgb urine dipstick: NEGATIVE
Ketones, ur: 5 mg/dL — AB
Leukocytes, UA: NEGATIVE
Nitrite: NEGATIVE
Protein, ur: 30 mg/dL — AB
Specific Gravity, Urine: 1.018 (ref 1.005–1.030)
pH: 6 (ref 5.0–8.0)

## 2018-12-14 LAB — COMPREHENSIVE METABOLIC PANEL
ALK PHOS: 131 U/L — AB (ref 38–126)
ALT: 200 U/L — ABNORMAL HIGH (ref 0–44)
ANION GAP: 9 (ref 5–15)
AST: 150 U/L — ABNORMAL HIGH (ref 15–41)
Albumin: 4 g/dL (ref 3.5–5.0)
BUN: 5 mg/dL — ABNORMAL LOW (ref 6–20)
CALCIUM: 9.1 mg/dL (ref 8.9–10.3)
CO2: 26 mmol/L (ref 22–32)
Chloride: 105 mmol/L (ref 98–111)
Creatinine, Ser: 0.83 mg/dL (ref 0.44–1.00)
GFR calc non Af Amer: >60 mL/min (ref >60)
Glucose, Bld: 87 mg/dL (ref 70–99)
Potassium: 4.2 mmol/L (ref 3.5–5.1)
SODIUM: 140 mmol/L (ref 135–145)
Total Bilirubin: 8.5 mg/dL — ABNORMAL HIGH (ref 0.3–1.2)
Total Protein: 7.6 g/dL (ref 6.5–8.1)

## 2018-12-14 LAB — CBC
HCT: 31.2 % — ABNORMAL LOW (ref 36.0–46.0)
Hemoglobin: 11.1 g/dL — ABNORMAL LOW (ref 12.0–15.0)
MCH: 28.8 pg (ref 26.0–34.0)
MCHC: 35.6 g/dL (ref 30.0–36.0)
MCV: 81 fL (ref 80.0–100.0)
Platelets: 113 10*3/uL — ABNORMAL LOW (ref 150–400)
RBC: 3.85 MIL/uL — ABNORMAL LOW (ref 3.87–5.11)
RDW: 14.6 % (ref 11.5–15.5)
WBC: 6.9 10*3/uL (ref 4.0–10.5)
nRBC: 0 % (ref 0.0–0.2)

## 2018-12-14 LAB — PROTIME-INR
INR: 1.15
Prothrombin Time: 14.6 seconds (ref 11.4–15.2)

## 2018-12-14 LAB — I-STAT BETA HCG BLOOD, ED (MC, WL, AP ONLY): I-stat hCG, quantitative: <5 m[IU]/mL (ref <5)

## 2018-12-14 LAB — LIPASE, BLOOD: LIPASE: 24 U/L (ref 11–51)

## 2018-12-14 LAB — APTT: APTT: 35 s (ref 24–36)

## 2018-12-14 MED ORDER — MORPHINE SULFATE (PF) 4 MG/ML IV SOLN
4.0000 mg | Freq: Once | INTRAVENOUS | Status: AC
  Administered 2018-12-14: 4 mg via INTRAVENOUS
  Filled 2018-12-14: qty 1

## 2018-12-14 MED ORDER — SODIUM CHLORIDE 0.9 % IV SOLN
INTRAVENOUS | Status: DC
  Administered 2018-12-14 – 2018-12-16 (×5): via INTRAVENOUS

## 2018-12-14 MED ORDER — ALBUTEROL SULFATE (2.5 MG/3ML) 0.083% IN NEBU: 2.5000 mg | INHALATION_SOLUTION | RESPIRATORY_TRACT | Status: DC | PRN

## 2018-12-14 MED ORDER — ONDANSETRON 4 MG PO TBDP: 4.0000 mg | ORAL_TABLET | Freq: Three times a day (TID) | ORAL | 0 refills | Status: DC | PRN

## 2018-12-14 MED ORDER — HYDROMORPHONE HCL 1 MG/ML IJ SOLN
0.5000 mg | Freq: Once | INTRAMUSCULAR | Status: AC
  Administered 2018-12-14: 0.5 mg via INTRAVENOUS
  Filled 2018-12-14: qty 1

## 2018-12-14 MED ORDER — ZOLPIDEM TARTRATE 5 MG PO TABS: 5.0000 mg | ORAL_TABLET | Freq: Every evening | ORAL | Status: DC | PRN

## 2018-12-14 MED ORDER — SODIUM CHLORIDE 0.9 % IV BOLUS
1000.0000 mL | Freq: Once | INTRAVENOUS | Status: AC
  Administered 2018-12-14: 1000 mL via INTRAVENOUS

## 2018-12-14 MED ORDER — MORPHINE SULFATE (PF) 2 MG/ML IV SOLN
2.0000 mg | INTRAVENOUS | Status: DC | PRN
  Administered 2018-12-15 – 2018-12-17 (×4): 2 mg via INTRAVENOUS
  Filled 2018-12-14 (×4): qty 1

## 2018-12-14 MED ORDER — NICOTINE 21 MG/24HR TD PT24
21.0000 mg | MEDICATED_PATCH | Freq: Every day | TRANSDERMAL | Status: DC
  Filled 2018-12-14 (×2): qty 1

## 2018-12-14 MED ORDER — ARIPIPRAZOLE 10 MG PO TABS
15.0000 mg | ORAL_TABLET | Freq: Every day | ORAL | Status: DC
  Administered 2018-12-14 – 2018-12-17 (×4): 15 mg via ORAL
  Filled 2018-12-14: qty 2
  Filled 2018-12-14 (×2): qty 1
  Filled 2018-12-14: qty 2

## 2018-12-14 MED ORDER — ONDANSETRON HCL 4 MG/2ML IJ SOLN
4.0000 mg | Freq: Three times a day (TID) | INTRAMUSCULAR | Status: DC | PRN
  Administered 2018-12-14 – 2018-12-16 (×2): 4 mg via INTRAVENOUS
  Filled 2018-12-14: qty 2

## 2018-12-14 NOTE — ED Provider Notes (Addendum)
[facility] Provider Note   CSN: [id number] Arrival date & time: 12/14/18  1317     History   Chief Complaint Chief Complaint  Patient presents with  . Abdominal Pain    HPI Tina Mayo is a 47 y.o. female who presents with abdominal pain. PMH significant for Bell's palsy, anxiety/depression, chronic right shoulder pain. She states that she started having intermittent abdominal pain on Friday. It initially was epigastric pain. She tried to change her diet and eat lighter foods and this did not seem to help. She has had nausea with dry heaves. She also has had constipation since Friday and can't pass gas. She has been trying several home remedies without relief such as Alka-seltzer. She denies fever, chills, vomiting. She went to UC and they did an Xray which showed gallstones and did not show bowel obstruction. She was recommended to come to the ED for further work up. She notes she was supposed to have her gallbladder removed a couple years ago but was told that the gallstones were small and so her surgery was cancelled. She denies any prior abdominal surgeries other than tubal ligation.  HPI  Past Medical History:  Diagnosis Date  . Anxiety   . Asthma   . Bell's palsy   . Depression   . Sickle cell trait [facility])     Patient Active Problem List   Diagnosis Date Noted  . Trichimoniasis 05/03/2015  . Pap smear for cervical cancer screening 05/02/2015  . Perimenopausal 05/02/2015  . Vaginal discharge 05/02/2015  . Bell's palsy 02/28/2015  . Insomnia 12/26/2013  . Cystic breast 12/26/2013  . Tobacco abuse 09/21/2013  . Chest pain 08/26/2013  . Thrombocytopenia, unspecified ([place]) 08/26/2013  . Cardiomegaly 08/26/2013  . Sickle cell trait ([place]) 08/26/2013  . Abnormal CT of the chest 08/26/2013  . Depression 08/26/2013  . Anemia, unspecified 08/26/2013    Past Surgical History:  Procedure Laterality Date  . benign breast tumor  removed     2006  . BREAST BIOPSY Right 2014   benign  . BREAST EXCISIONAL BIOPSY Right 2007   benign   . right ankle surgery     2010  . TUBAL LIGATION       OB History   No obstetric history on file.      Home Medications    Prior to Admission medications   Medication Sig Start Date End Date Taking? Authorizing Provider  acetaminophen (TYLENOL) 500 MG tablet Take 500 mg by mouth every 6 (six) hours as needed for moderate pain or headache.     [provider]  ARIPiprazole (ABILIFY) 15 MG tablet Take 15 mg by mouth at bedtime. 01/23/17   [provider]  ibuprofen (ADVIL,MOTRIN) 200 MG tablet Take 800 mg by mouth every 6 (six) hours as needed for headache or moderate pain.     [provider]  meloxicam (MOBIC) 15 MG tablet Take 1 tablet (15 mg total) by mouth daily. 12/07/18   Bast, Tressia Miners A, NP  ondansetron (ZOFRAN-ODT) 4 MG disintegrating tablet Take 1 tablet (4 mg total) by mouth every 8 (eight) hours as needed for nausea or vomiting. 12/14/18   Vanessa Kick, MD  sertraline (ZOLOFT) 25 MG tablet Take 25 mg by mouth at bedtime.  01/23/17   [provider]  traMADol (ULTRAM) 50 MG tablet Take 1 tablet (50 mg total) by mouth every 6 (six) hours as needed. 12/07/18   Orvan July, NP  Family History Family History  Problem Relation Age of Onset  . Breast cancer Mother   . Cancer Mother   . Diabetes Mellitus II Father   . Diabetes Father   . Breast cancer Maternal Grandmother     Social History Social History   Tobacco Use  . Smoking status: Current Every Day Smoker    Packs/day: 0.10    Types: Cigarettes  . Smokeless tobacco: Current User  Substance Use Topics  . Alcohol use: No  . Drug use: No     Allergies   Levaquin [levofloxacin in d5w] and Penicillins   Review of Systems Review of Systems  Constitutional: Negative for fever.  Respiratory: Negative for shortness of breath.   Cardiovascular: Negative for chest pain.    Gastrointestinal: Positive for abdominal pain and constipation. Negative for nausea and vomiting.  Genitourinary: Negative for difficulty urinating and dysuria.  All other systems reviewed and are negative.    Physical Exam Updated Vital Signs BP 127/75   Pulse (!) 55   Temp 97.6 F (36.4 C) (Oral)   Resp 18   Ht 5\' 7"  (1.702 m)   Wt 90.7 kg   LMP 01/12/2017   SpO2 98%   BMI 31.32 kg/m   Physical Exam Vitals signs and nursing note reviewed.  Constitutional:      General: She is not in acute distress.    Appearance: She is well-developed. She is obese.     Comments: Tearful and frustrated due to wait time but after talking she is much happier. Right sided facial droop  HENT:     Head: Normocephalic and atraumatic.  Eyes:     General: No scleral icterus.       Right eye: No discharge.        Left eye: No discharge.     Conjunctiva/sclera: Conjunctivae normal.     Pupils: Pupils are equal, round, and reactive to light.  Neck:     Musculoskeletal: Normal range of motion.  Cardiovascular:     Rate and Rhythm: Normal rate and regular rhythm.  Pulmonary:     Effort: Pulmonary effort is normal. No respiratory distress.     Breath sounds: Normal breath sounds.  Abdominal:     General: Abdomen is protuberant. Bowel sounds are normal. There is no distension.     Palpations: Abdomen is soft.     Tenderness: There is abdominal tenderness in the right upper quadrant.     Hernia: No hernia is present.  Skin:    General: Skin is warm and dry.  Neurological:     Mental Status: She is alert and oriented to person, place, and time.  Psychiatric:        Behavior: Behavior normal.      ED Treatments / Results  Labs (all labs ordered are listed, but only abnormal results are displayed) Labs Reviewed  COMPREHENSIVE METABOLIC PANEL - Abnormal; Notable for the following components:      Result Value   BUN 5 (*)    AST 150 (*)    ALT 200 (*)    Alkaline Phosphatase 131 (*)     Total Bilirubin 8.5 (*)    All other components within normal limits  CBC - Abnormal; Notable for the following components:   RBC 3.85 (*)    Hemoglobin 11.1 (*)    HCT 31.2 (*)    Platelets 113 (*)    All other components within normal limits  URINALYSIS, ROUTINE W REFLEX MICROSCOPIC - Abnormal;  Notable for the following components:   Color, Urine AMBER (*)    APPearance HAZY (*)    Bilirubin Urine MODERATE (*)    Ketones, ur 5 (*)    Protein, ur 30 (*)    Bacteria, UA RARE (*)    All other components within normal limits  LIPASE, BLOOD  I-STAT BETA HCG BLOOD, ED ([facility], [facility], [facility] ONLY)    EKG None  Radiology Dg Abd Acute W/chest  Result Date: 12/14/2018 CLINICAL DATA:  Abdominal pain, nausea EXAM: DG ABDOMEN ACUTE W/ 1V CHEST COMPARISON:  06/02/2018 FINDINGS: Peribronchial thickening compatible with bronchitis. Heart is normal size. No confluent opacities or effusions. Numerous gallstones noted in the right upper quadrant, likely filling the gallbladder. No evidence of bowel obstruction or free air. No organomegaly. No acute bony abnormality. IMPRESSION: Peribronchial thickening compatible with bronchitis. Gallstones fill the gallbladder. No evidence of bowel obstruction or free air. Electronically Signed   By: Rolm Baptise M.D.   On: 12/14/2018 12:17   US Abdomen Limited Ruq  Result Date: 12/14/2018 CLINICAL DATA:  Upper abdominal pain EXAM: ULTRASOUND ABDOMEN LIMITED RIGHT UPPER QUADRANT COMPARISON:  None. FINDINGS: Gallbladder: Within the gallbladder, there are multiple echogenic foci which move and shadow consistent with cholelithiasis. Largest gallstone measures 9 mm in length. The gallbladder wall is not appreciably thickened, and there is no pericholecystic fluid. No sonographic Murphy sign noted by sonographer. Common bile duct: Diameter: 9 mm which is enlarged. No biliary duct mass or calculus evident. No appreciable intrahepatic biliary duct dilatation. Liver: No focal lesion  identified. Within normal limits in parenchymal echogenicity. Portal vein is patent on color Doppler imaging with normal direction of blood flow towards the liver. IMPRESSION: Cholelithiasis. No appreciable gallbladder wall thickening or pericholecystic fluid. Prominence of the common hepatic duct without demonstrable biliary duct mass or calculus. If further evaluation of the common bile duct is felt to be warranted, MRCP nonemergently would be the imaging study of choice for further assessment. Electronically Signed   By: Lowella Grip III M.D.   On: 12/14/2018 21:02    Procedures Procedures (including critical care time)  Medications Ordered in ED Medications  morphine 4 MG/ML injection 4 mg (4 mg Intravenous Given 12/14/18 1917)  sodium chloride 0.9 % bolus 1,000 mL (0 mLs Intravenous Stopped 12/14/18 2109)     Initial Impression / Assessment and Plan / ED Course  I have reviewed the triage vital signs and the nursing notes.  Pertinent labs & imaging results that were available during my care of the patient were reviewed by me and considered in my medical decision making (see chart for details).  48 year old female presents with RUQ abdominal pain for three days. Her vitals are normal. She is tender in the RUQ. CBC is normal and CMP is remarkable for elevated LFTs and bilirubin. Will order [place], morphine, fluids.  RUQ shows no evidence of cholecystitis but does show dilated CBD. Discussed with Dr. Collene Mares with GI who recommends NPO after midnight and they will see in the morning. She also recommends MRCP and antibiotics although she does not have elevated WBC or fever.  10:10 PM Discussed with Dr. Blaine Hamper who will admit.   Final Clinical Impressions(s) / ED Diagnoses   Final diagnoses:  RUQ abdominal pain  Choledocholithiasis  Constipation, unspecified constipation type    ED Discharge Orders    None       Recardo Evangelist, PA-C 12/14/18 2211    Recardo Evangelist,  PA-C  12/14/18 2213    Charlesetta Shanks, MD 12/16/18 [phone]

## 2018-12-14 NOTE — ED Notes (Signed)
Patient transported to [facility] 

## 2018-12-14 NOTE — Discharge Instructions (Addendum)
You have been seen today for abdominal pain possibly related to gallstones. Your evaluation was not suggestive of any emergent condition requiring medical intervention at this time. However, some abdominal problems make take more time to appear. Therefore, it's very important for you to pay attention to any new symptoms or worsening of your current condition.  Please return here or to the Emergency Department immediately should you feel worse in any way or have any of the following symptoms: increasing or different abdominal pain, persistent vomiting, fevers, or shaking chills.

## 2018-12-14 NOTE — ED Triage Notes (Signed)
Onset 3 days ago developed abdominal pain continued today and seen at Urgent Care today. Sent to the ED for evaluation for possible gallstones. Last good  BM  Per patient 12/11/18 and pain currently.

## 2018-12-14 NOTE — H&P (Signed)
History and Physical    Tina Mayo NTZ:[id number] DOB: 1971-03-20 DOA: 12/14/2018  Referring MD/NP/PA:   PCP: [facility]   Patient coming from:  The patient is coming from home.  At baseline, pt is independent for most of ADL.        Chief Complaint: Abdominal pain, nausea vomiting  HPI: Tina Mayo is a 48 y.o. female with medical history significant of asthma, depression, sickle cell trait, tobacco abuse, chronic thrombocytopenia, who presents with abdominal pain, nausea and vomiting.  Patient states that she has been having nausea, vomiting, abdominal pain in the past 4 days, which has worsened today.  The abdominal pain is located in the epigastric area and right upper quadrant abdomen, which is intermittent, 9 out of 10 severity, nonradiating.  She vomited 5-6 times yesterday, with nonbilious, nonbloody vomitus.  No fever or chills.  Patient is constipated,.  No diarrhea.  Patient has mild shortness breath sometimes, and mild dry cough, no CP. Denies symptoms of UTI or unilateral weakness.  ED Course: pt was found to have abnormal liver function (ALP 131, AST 150, ALT 200, total bilirubin 8.5), lipase 24, negative pregnancy test, negative urinalysis, electrolytes renal function okay, temperature normal, no tachycardia, oxygen saturation 100% on room air.  X-ray of acute abdomen/chest showed peribronchial thickening, gallstone, no free air.  US-RUQ showed gallstone in the common bile duct dilation, 70mm.  Patient is placed on MedSurg [facility] for observation.  GI, Dr. Collene Mares was consulted by EDP.  Review of Systems:   General: no fevers, chills, no body weight gain, has poor appetite, has fatigue HEENT: no blurry vision, hearing changes or sore throat Respiratory: has dyspnea, coughing, no  wheezing CV: no chest pain, no palpitations GI: has nausea, vomiting, abdominal pain, constipation GU: no dysuria, burning on urination, increased urinary frequency, hematuria  Ext: no leg  edema Neuro: no unilateral weakness, numbness, or tingling, no vision change or hearing loss Skin: no rash, no skin tear. MSK: No muscle spasm, no deformity, no limitation of range of movement in spin Heme: No easy bruising.  Travel history: No recent long distant travel.  Allergy:  Allergies  Allergen Reactions  . Levaquin [Levofloxacin In D5w] Anaphylaxis  . Penicillins Swelling    DID THE REACTION INVOLVE: Swelling of the face/tongue/throat, SOB, or low BP? Yes Sudden or severe rash/hives, skin peeling, or the inside of the mouth or nose? No Did it require medical treatment? Yes When did it last happen?8 years ago If all above answers are "NO", may proceed with cephalosporin use.     Past Medical History:  Diagnosis Date  . Anxiety   . Asthma   . Bell's palsy   . Depression   . Sickle cell trait [facility])     Past Surgical History:  Procedure Laterality Date  . benign breast tumor removed     2006  . BREAST BIOPSY Right 2014   benign  . BREAST EXCISIONAL BIOPSY Right 2007   benign   . right ankle surgery     2010  . TUBAL LIGATION      Social History:  reports that she has been smoking cigarettes. She has been smoking about 0.10 packs per day. She uses smokeless tobacco. She reports that she does not drink alcohol or use drugs.  Family History:  Family History  Problem Relation Age of Onset  . Breast cancer Mother   . Cancer Mother   . Diabetes Mellitus II Father   . Diabetes Father   .  Breast cancer Maternal Grandmother      Prior to Admission medications   Medication Sig Start Date End Date Taking? Authorizing Provider  acetaminophen (TYLENOL) 650 MG CR tablet Take 650 mg by mouth every 4 (four) hours as needed for pain.   Yes [provider]  ARIPiprazole (ABILIFY) 15 MG tablet Take 15 mg by mouth at bedtime. 01/23/17  Yes [provider]  ibuprofen (ADVIL,MOTRIN) 200 MG tablet Take 800 mg by mouth every 6 (six) hours as needed for  headache or moderate pain.    Yes [provider]  meloxicam (MOBIC) 15 MG tablet Take 1 tablet (15 mg total) by mouth daily. 12/07/18  Yes Bast, Traci A, NP  sertraline (ZOLOFT) 25 MG tablet Take 25 mg by mouth at bedtime.  01/23/17  Yes [provider]  traMADol (ULTRAM) 50 MG tablet Take 1 tablet (50 mg total) by mouth every 6 (six) hours as needed. 12/07/18  Yes Bast, Traci A, NP  ondansetron (ZOFRAN-ODT) 4 MG disintegrating tablet Take 1 tablet (4 mg total) by mouth every 8 (eight) hours as needed for nausea or vomiting. 12/14/18   Vanessa Kick, MD    Physical Exam: Vitals:   12/14/18 2330 12/14/18 2331 12/15/18 0000 12/15/18 0230  BP:  111/74 140/87 115/77  Pulse: 73 78 84 73  Resp:  16  20  Temp:    98.3 F (36.8 C)  TempSrc:    Oral  SpO2: 96% 98%  94%  Weight:      Height:       General: Not in acute distress HEENT:       Eyes: PERRL, EOMI, has scleral icterus.       ENT: No discharge from the ears and nose, no pharynx injection, no tonsillar enlargement.        Neck: No JVD, no bruit, no mass felt. Heme: No neck lymph node enlargement. Cardiac: S1/S2, RRR, No murmurs, No gallops or rubs. Respiratory: No rales, wheezing, rhonchi or rubs. GI: Soft, nondistended, has tender in RUQ, no rebound pain, no organomegaly, BS present. GU: No hematuria Ext: No pitting leg edema bilaterally. 2+DP/PT pulse bilaterally. Musculoskeletal: No joint deformities, No joint redness or warmth, no limitation of ROM in spin. Skin: No rashes.  Neuro: Alert, oriented X3, cranial nerves II-XII grossly intact, moves all extremities normally. Psych: Patient is not psychotic, no suicidal or hemocidal ideation.  Labs on Admission: I have personally reviewed following labs and imaging studies  CBC: Recent Labs  Lab 12/14/18 1352  WBC 6.9  HGB 11.1*  HCT 31.2*  MCV 81.0  PLT 644*   Basic Metabolic Panel: Recent Labs  Lab 12/14/18 1352  NA 140  K 4.2  CL 105  CO2 26    GLUCOSE 87  BUN 5*  CREATININE 0.83  CALCIUM 9.1   GFR: Estimated Creatinine Clearance: 94.7 mL/min (by C-G formula based on SCr of 0.83 mg/dL). Liver Function Tests: Recent Labs  Lab 12/14/18 1352  AST 150*  ALT 200*  ALKPHOS 131*  BILITOT 8.5*  PROT 7.6  ALBUMIN 4.0   Recent Labs  Lab 12/14/18 1352  LIPASE 24   No results for input(s): AMMONIA in the last 168 hours. Coagulation Profile: Recent Labs  Lab 12/14/18 2310  INR 1.15   Cardiac Enzymes: No results for input(s): CKTOTAL, CKMB, CKMBINDEX, TROPONINI in the last 168 hours. BNP (last 3 results) No results for input(s): PROBNP in the last 8760 hours. HbA1C: No results for input(s): HGBA1C in  the last 72 hours. CBG: No results for input(s): GLUCAP in the last 168 hours. Lipid Profile: No results for input(s): CHOL, HDL, LDLCALC, TRIG, CHOLHDL, LDLDIRECT in the last 72 hours. Thyroid Function Tests: No results for input(s): TSH, T4TOTAL, FREET4, T3FREE, THYROIDAB in the last 72 hours. Anemia Panel: No results for input(s): VITAMINB12, FOLATE, FERRITIN, TIBC, IRON, RETICCTPCT in the last 72 hours. Urine analysis:    Component Value Date/Time   COLORURINE AMBER (A) 12/14/2018 1755   APPEARANCEUR HAZY (A) 12/14/2018 1755   LABSPEC 1.018 12/14/2018 1755   PHURINE 6.0 12/14/2018 1755   GLUCOSEU NEGATIVE 12/14/2018 1755   HGBUR NEGATIVE 12/14/2018 1755   BILIRUBINUR MODERATE (A) 12/14/2018 1755   KETONESUR 5 (A) 12/14/2018 1755   PROTEINUR 30 (A) 12/14/2018 1755   UROBILINOGEN 1.0 01/30/2015 1110   NITRITE NEGATIVE 12/14/2018 1755   LEUKOCYTESUR NEGATIVE 12/14/2018 1755   Sepsis Labs: @LABRCNTIP (procalcitonin:4,lacticidven:4) )No results found for this or any previous visit (from the past 240 hour(s)).   Radiological Exams on Admission: Dg Abd Acute W/chest  Result Date: 12/14/2018 CLINICAL DATA:  Abdominal pain, nausea EXAM: DG ABDOMEN ACUTE W/ 1V CHEST COMPARISON:  06/02/2018 FINDINGS: Peribronchial  thickening compatible with bronchitis. Heart is normal size. No confluent opacities or effusions. Numerous gallstones noted in the right upper quadrant, likely filling the gallbladder. No evidence of bowel obstruction or free air. No organomegaly. No acute bony abnormality. IMPRESSION: Peribronchial thickening compatible with bronchitis. Gallstones fill the gallbladder. No evidence of bowel obstruction or free air. Electronically Signed   By: Rolm Baptise M.D.   On: 12/14/2018 12:17   US Abdomen Limited Ruq  Result Date: 12/14/2018 CLINICAL DATA:  Upper abdominal pain EXAM: ULTRASOUND ABDOMEN LIMITED RIGHT UPPER QUADRANT COMPARISON:  None. FINDINGS: Gallbladder: Within the gallbladder, there are multiple echogenic foci which move and shadow consistent with cholelithiasis. Largest gallstone measures 9 mm in length. The gallbladder wall is not appreciably thickened, and there is no pericholecystic fluid. No sonographic Murphy sign noted by sonographer. Common bile duct: Diameter: 9 mm which is enlarged. No biliary duct mass or calculus evident. No appreciable intrahepatic biliary duct dilatation. Liver: No focal lesion identified. Within normal limits in parenchymal echogenicity. Portal vein is patent on color Doppler imaging with normal direction of blood flow towards the liver. IMPRESSION: Cholelithiasis. No appreciable gallbladder wall thickening or pericholecystic fluid. Prominence of the common hepatic duct without demonstrable biliary duct mass or calculus. If further evaluation of the common bile duct is felt to be warranted, MRCP nonemergently would be the imaging study of choice for further assessment. Electronically Signed   By: Lowella Grip III M.D.   On: 12/14/2018 21:02     EKG:  Not done in ED, will get one.   Assessment/Plan Principal Problem:   Choledocholithiasis Active Problems:   Thrombocytopenia, unspecified ([place])   Depression   Tobacco abuse   Abdominal  pain   Choledocholithiasis: her abdominal pain, nausea vomiting are likely due to choledocholithiasis given RUQ-US findings.  GI, Dr. Collene Mares was consulted by EDP.  Due to concerning for developing cholecystitis or cholangitis, Dr. Collene Mares recommended starting patient with antibiotics per ED physicians note.  -place in med-surg bed for obs -will get MRCP per GI's accommodations -Started Flagyl -PRN morphine for pain, Zofran for nausea -IV fluid hydration -keep pt NPO  Thrombocytopenia, unspecified ([place]): This is a chronic issue.  Baseline platelet is 70-100.  Today platelets 113.  No bleeding tendency. -Follow-up with CBC  Depression: -Continue home Abilify  Tobacco abuse: -nicotine patch   DVT ppx: SCD Code Status: Full code Family Communication: None at bed side.   Disposition Plan:  Anticipate discharge back to previous home environment Consults called:  GI, Dr. Collene Mares Admission status:    medical floor/obs    Date of Service 12/15/2018    Ivor Costa [facility] Pager [phone]  If 7PM-7AM, please contact night-coverage [email or web address] Password [email or web address] 12/15/2018, 4:59 AM

## 2018-12-14 NOTE — ED Provider Notes (Signed)
[place]   [id number] 12/14/18 Arrival Time: 1016  ASSESSMENT & PLAN:  1. Acute bilateral upper abdominal pain   2. Chronic right shoulder pain   3. Mild nausea   4. Constipation, unspecified constipation type    I have personally viewed the imaging studies ordered this visit. No sign of SBO. Prominent gallstones.  Meds ordered this encounter  Medications  . ondansetron (ZOFRAN-ODT) 4 MG disintegrating tablet    Sig: Take 1 tablet (4 mg total) by mouth every 8 (eight) hours as needed for nausea or vomiting.    Dispense:  15 tablet    Refill:  0     Discharge Instructions     You have been seen today for abdominal pain possibly related to gallstones. Your evaluation was not suggestive of any emergent condition requiring medical intervention at this time. However, some abdominal problems make take more time to appear. Therefore, it's very important for you to pay attention to any new symptoms or worsening of your current condition.  Please return here or to the Emergency Department immediately should you feel worse in any way or have any of the following symptoms: increasing or different abdominal pain, persistent vomiting, fevers, or shaking chills.      She is debating on whether to proceed to the ED now vs trying to call her PCP tomorrow to ask if they will schedule an U/S for her. She is leaning towards going to the ED given the amount of pain she describes last evening and early this morning.  For chronic R shoulder pain: Follow-up Information    [facility], [facility].   Specialty:  Internal Medicine Why:  Keep your scheduled appointment on January 9th. Contact information: [address] [place]  [address] [phone]          If she does not go to the ED now, she may try OTC Miralax to see if this will help her move her bowels.  Reviewed expectations re: course of current medical issues. Questions answered. Outlined signs and symptoms  indicating need for more acute intervention. Patient verbalized understanding. After Visit Summary given.   SUBJECTIVE: History from: patient. Tina Mayo is a 48 y.o. female who presents with complaint of fairly persistent epigastric/upper abdominal discomfort. Onset gradual, over the past 3-4 days. Discomfort described as a dull feeling; that does radiate to her back when present. Exacerbation of pain is sharp in nature and lasts 10-25 minutes before easing. Symptoms are slowly worsening since beginning; becoming more persistent. Reports last normal bowel movement was about 3-4 days ago; very small movement 2 days ago that was "very small" and without blood; no loose/liquid stool. Fever: absent. Aggravating factors: have not been identified. Alleviating factors: have not been identified. Associated symptoms: feels bloated; reports not passing much gas from her bottom; nausea with one episode of emesis yesterday and none today. She denies arthralgias, chills, headache, myalgias and sweats. Appetite: decreased. PO intake: decreased; "scared to eat because of the pain and because I haven't had a bowel movement". Ambulatory without assistance. Urinary symptoms: none. No specific back pain reported. History of similar: no. OTC treatment: none reported.  Past Surgical History:  Procedure Laterality Date  . benign breast tumor removed     2006  . BREAST BIOPSY Right 2014   benign  . BREAST EXCISIONAL BIOPSY Right 2007   benign   . right ankle surgery     2010  . TUBAL LIGATION     Her chronic R shoulder  pain is unchanged. Not much help with prescribed Tramadol and meloxicam on previous visit. No new injury. Has scheduled f/u with her doctor on January 9th to discuss.  ROS: As per HPI. All other systems negative.  OBJECTIVE:  Vitals:   12/14/18 1039  BP: 127/77  Pulse: 75  Resp: 18  Temp: 97.8 F (36.6 C)  TempSrc: Oral  SpO2: 99%    General appearance: alert, oriented; appears  uncomfortable; no distress HEENT: [place]; AT; oropharynx moist Neck: FROM; supple Lungs: clear to auscultation bilaterally; unlabored respirations Heart: regular rate and rhythm Abdomen: obese; soft; without distention; reports mild to moderate and poorly localized tenderness over epigastric/mid upper abdomen; audible bowel sounds; without masses or organomegaly; without guarding or rebound tenderness Back: without CVA tenderness; FROM at waist Extremities: without LE edema; symmetrical; without gross deformities Skin: warm and dry Neurologic: normal gait Psychological: alert and cooperative; normal mood and affect   Allergies  Allergen Reactions  . Levaquin [Levofloxacin In D5w] Anaphylaxis  . Penicillins Swelling                                               Past Medical History:  Diagnosis Date  . Anxiety   . Asthma   . Bell's palsy   . Depression   . Sickle cell trait ([place])    Social History   Socioeconomic History  . Marital status: Single    Spouse name: Not on file  . Number of children: Not on file  . Years of education: Not on file  . Highest education level: Not on file  Occupational History  . Not on file  Social Needs  . Financial resource strain: Not on file  . Food insecurity:    Worry: Not on file    Inability: Not on file  . Transportation needs:    Medical: Not on file    Non-medical: Not on file  Tobacco Use  . Smoking status: Current Every Day Smoker    Packs/day: 0.10    Types: Cigarettes  . Smokeless tobacco: Current User  Substance and Sexual Activity  . Alcohol use: No  . Drug use: No  . Sexual activity: Not on file  Lifestyle  . Physical activity:    Days per week: Not on file    Minutes per session: Not on file  . Stress: Not on file  Relationships  . Social connections:    Talks on phone: Not on file    Gets together: Not on file    Attends religious service: Not on file    Active member of club or organization: Not on file     Attends meetings of clubs or organizations: Not on file    Relationship status: Not on file  . Intimate partner violence:    Fear of current or ex partner: Not on file    Emotionally abused: Not on file    Physically abused: Not on file    Forced sexual activity: Not on file  Other Topics Concern  . Not on file  Social History Narrative  . Not on file   Family History  Problem Relation Age of Onset  . Breast cancer Mother   . Cancer Mother   . Diabetes Mellitus II Father   . Diabetes Father   . Breast cancer Maternal Grandmother     Vanessa Kick, MD  12/14/18 1240 

## 2018-12-14 NOTE — ED Notes (Signed)
Pt given water and sandwich.

## 2018-12-14 NOTE — ED Triage Notes (Signed)
Pt presents to [facility] for assessment of epigastric abdominal pain since Friday.  Patient c/o nausea, emesis, constipation, lack of appetite, sob when pain is severe, LBM 12/12/18.  Pt seen here previously for other concerns and is concerned that she is not feeling better from that visit either.

## 2018-12-14 NOTE — ED Notes (Signed)
Patient able to ambulate independently  

## 2018-12-15 ENCOUNTER — Other Ambulatory Visit: Payer: Self-pay

## 2018-12-15 ENCOUNTER — Observation Stay (HOSPITAL_COMMUNITY): Payer: Medicaid Other

## 2018-12-15 DIAGNOSIS — D696 Thrombocytopenia, unspecified: Secondary | ICD-10-CM | POA: Diagnosis present

## 2018-12-15 DIAGNOSIS — K8042 Calculus of bile duct with acute cholecystitis without obstruction: Secondary | ICD-10-CM | POA: Diagnosis present

## 2018-12-15 DIAGNOSIS — Z881 Allergy status to other antibiotic agents status: Secondary | ICD-10-CM | POA: Diagnosis not present

## 2018-12-15 DIAGNOSIS — Z833 Family history of diabetes mellitus: Secondary | ICD-10-CM | POA: Diagnosis not present

## 2018-12-15 DIAGNOSIS — Z803 Family history of malignant neoplasm of breast: Secondary | ICD-10-CM | POA: Diagnosis not present

## 2018-12-15 DIAGNOSIS — F17210 Nicotine dependence, cigarettes, uncomplicated: Secondary | ICD-10-CM | POA: Diagnosis present

## 2018-12-15 DIAGNOSIS — R1013 Epigastric pain: Secondary | ICD-10-CM | POA: Diagnosis present

## 2018-12-15 DIAGNOSIS — F419 Anxiety disorder, unspecified: Secondary | ICD-10-CM | POA: Diagnosis present

## 2018-12-15 DIAGNOSIS — K660 Peritoneal adhesions (postprocedural) (postinfection): Secondary | ICD-10-CM | POA: Diagnosis present

## 2018-12-15 DIAGNOSIS — R29810 Facial weakness: Secondary | ICD-10-CM | POA: Diagnosis present

## 2018-12-15 DIAGNOSIS — K5900 Constipation, unspecified: Secondary | ICD-10-CM

## 2018-12-15 DIAGNOSIS — Z6831 Body mass index (BMI) 31.0-31.9, adult: Secondary | ICD-10-CM | POA: Diagnosis not present

## 2018-12-15 DIAGNOSIS — F329 Major depressive disorder, single episode, unspecified: Secondary | ICD-10-CM | POA: Diagnosis present

## 2018-12-15 DIAGNOSIS — D573 Sickle-cell trait: Secondary | ICD-10-CM | POA: Diagnosis present

## 2018-12-15 DIAGNOSIS — Z791 Long term (current) use of non-steroidal anti-inflammatories (NSAID): Secondary | ICD-10-CM | POA: Diagnosis not present

## 2018-12-15 DIAGNOSIS — K5901 Slow transit constipation: Secondary | ICD-10-CM | POA: Diagnosis present

## 2018-12-15 DIAGNOSIS — Z880 Allergy status to penicillin: Secondary | ICD-10-CM | POA: Diagnosis not present

## 2018-12-15 DIAGNOSIS — J45909 Unspecified asthma, uncomplicated: Secondary | ICD-10-CM | POA: Diagnosis present

## 2018-12-15 LAB — COMPREHENSIVE METABOLIC PANEL
ALT: 152 U/L — ABNORMAL HIGH (ref 0–44)
AST: 97 U/L — ABNORMAL HIGH (ref 15–41)
Albumin: 3.3 g/dL — ABNORMAL LOW (ref 3.5–5.0)
Alkaline Phosphatase: 115 U/L (ref 38–126)
Anion gap: 6 (ref 5–15)
BUN: <5 mg/dL — ABNORMAL LOW (ref 6–20)
CO2: 24 mmol/L (ref 22–32)
Calcium: 8.4 mg/dL — ABNORMAL LOW (ref 8.9–10.3)
Chloride: 111 mmol/L (ref 98–111)
Creatinine, Ser: 0.95 mg/dL (ref 0.44–1.00)
GFR calc Af Amer: >60 mL/min (ref >60)
GFR calc non Af Amer: >60 mL/min (ref >60)
Glucose, Bld: 106 mg/dL — ABNORMAL HIGH (ref 70–99)
Potassium: 3.6 mmol/L (ref 3.5–5.1)
Sodium: 141 mmol/L (ref 135–145)
Total Bilirubin: 3.8 mg/dL — ABNORMAL HIGH (ref 0.3–1.2)
Total Protein: 6.5 g/dL (ref 6.5–8.1)

## 2018-12-15 LAB — HIV ANTIBODY (ROUTINE TESTING W REFLEX): HIV Screen 4th Generation wRfx: NONREACTIVE

## 2018-12-15 MED ORDER — POLYETHYLENE GLYCOL 3350 17 G PO PACK
17.0000 g | PACK | Freq: Every day | ORAL | Status: DC | PRN
  Administered 2018-12-17: 17 g via ORAL
  Filled 2018-12-15: qty 1

## 2018-12-15 MED ORDER — GADOBUTROL 1 MMOL/ML IV SOLN
10.0000 mL | Freq: Once | INTRAVENOUS | Status: AC | PRN
  Administered 2018-12-15: 10 mL via INTRAVENOUS

## 2018-12-15 MED ORDER — CHLORHEXIDINE GLUCONATE CLOTH 2 % EX PADS
6.0000 | MEDICATED_PAD | Freq: Once | CUTANEOUS | Status: AC
  Administered 2018-12-15: 6 via TOPICAL

## 2018-12-15 MED ORDER — METRONIDAZOLE IN NACL 5-0.79 MG/ML-% IV SOLN
500.0000 mg | Freq: Three times a day (TID) | INTRAVENOUS | Status: DC
  Administered 2018-12-15: 500 mg via INTRAVENOUS
  Filled 2018-12-15 (×2): qty 100

## 2018-12-15 MED ORDER — VANCOMYCIN HCL 10 G IV SOLR
1500.0000 mg | Freq: Once | INTRAVENOUS | Status: AC
  Administered 2018-12-16: 1500 mg via INTRAVENOUS
  Filled 2018-12-15: qty 1500

## 2018-12-15 NOTE — Progress Notes (Signed)
Pharmacy Antibiotic Note  Tina Mayo is a 48 y.o. female admitted on 12/14/2018 for laprascopic cholecystectomy .  Pharmacy has been consulted for Vancomycin dosing for pre-op surgical prophylaxis. Per surgery schedule, patient due to have surgery ~0930.   Plan: Vancomycin 1500 mg IV x 1 pre-op at 0800 on 12/16/18.  Please re-consult if post-op doses are required.   Height: 5\' 7"  (170.2 cm) Weight: 200 lb (90.7 kg) IBW/kg (Calculated) : 61.6  Temp (24hrs), Avg:98.3 F (36.8 C), Min:97.9 F (36.6 C), Max:98.7 F (37.1 C)  Recent Labs  Lab 12/14/18 1352 12/15/18 0559  WBC 6.9  --   CREATININE 0.83 0.95    Estimated Creatinine Clearance: 82.8 mL/min (by C-G formula based on SCr of 0.95 mg/dL).    Allergies  Allergen Reactions  . Levaquin [Levofloxacin In D5w] Anaphylaxis  . Penicillins Swelling    DID THE REACTION INVOLVE: Swelling of the face/tongue/throat, SOB, or low BP? Yes Sudden or severe rash/hives, skin peeling, or the inside of the mouth or nose? No Did it require medical treatment? Yes When did it last happen?8 years ago If all above answers are "NO", may proceed with cephalosporin use.    Donnice Nielsen A. Levada Dy, PharmD, [place] Pager: [phone] Please utilize Amion for appropriate phone number to reach the unit pharmacist ([place])    12/15/2018 3:21 PM

## 2018-12-15 NOTE — Progress Notes (Signed)
Progress Note    Tina Mayo  HRC:[id number] DOB: 1971-06-13  DOA: 12/14/2018 PCP: [name]    Brief Narrative:    Medical records reviewed and are as summarized below:  Tina Mayo is an 48 y.o. female with medical history significant of asthma, depression, sickle cell trait, tobacco abuse, chronic thrombocytopenia, who presents with abdominal pain, nausea and vomiting.  MRCP w/o stones in CBD so GI has signed off and suggested general surgery consult.  Viral hepatitis panel pending  Assessment/Plan:   Principal Problem:   Choledocholithiasis Active Problems:   Thrombocytopenia, unspecified (HCC)   Depression   Tobacco abuse   Abdominal pain  N/V/RUQ pain with elevated liver enzymes -MRCP w/o stone in CBD per reading -place in med-surg bed for obs -will get MRCP per GI's accommodations -patient was started on monotherapy with IV Flagyl but not clear why flagyl alone? -PRN medication for pain, Zofran for nausea -IV fluid hydration -STRICT NPO until seen by general surgery -trend LFTs and get viral hepatitis panel  Thrombocytopenia, unspecified ([place]):  -chronic issue.  Baseline platelet is 70-100.  Today platelets 113. -trend  Depression: -Continue home Abilify  Tobacco abuse: -nicotine patch  obesity Body mass index is 31.32 kg/m.   Family Communication/Anticipated D/C date and plan/Code Status   DVT prophylaxis: scd Code Status: Full Code.  Family Communication:  Disposition Plan: pending plans per general surgery   Medical Consultants:    GI ([place])- signed off  General surgery consult  Subjective:   Asking for food, despite c/o pain  Objective:    Vitals:   12/14/18 2331 12/15/18 0000 12/15/18 0230 12/15/18 0552  BP:  140/87 115/77 126/78  Pulse:  84 73 78  Resp: 16  20 19   Temp:   98.3 F (36.8 C) 97.9 F (36.6 C)  TempSrc:   Oral Oral  SpO2: 98%  94% 95%  Weight:      Height:        Intake/Output Summary  (Last 24 hours) at 12/15/2018 1112 Last data filed at 12/15/2018 0300 Gross per 24 hour  Intake 402.08 ml  Output -  Net 402.08 ml   Filed Weights   12/14/18 1345  Weight: 90.7 kg    Exam: In bed, NAD rrr Clear, no wheezing +BS, tender in RUQ A+OX3  Data Reviewed:   I have personally reviewed following labs and imaging studies:  Labs: Labs show the following:   Basic Metabolic Panel: Recent Labs  Lab 12/14/18 1352 12/15/18 0559  NA 140 141  K 4.2 3.6  CL 105 111  CO2 26 24  GLUCOSE 87 106*  BUN 5* <5*  CREATININE 0.83 0.95  CALCIUM 9.1 8.4*   GFR Estimated Creatinine Clearance: 82.8 mL/min (by C-G formula based on SCr of 0.95 mg/dL). Liver Function Tests: Recent Labs  Lab 12/14/18 1352 12/15/18 0559  AST 150* 97*  ALT 200* 152*  ALKPHOS 131* 115  BILITOT 8.5* 3.8*  PROT 7.6 6.5  ALBUMIN 4.0 3.3*   Recent Labs  Lab 12/14/18 1352  LIPASE 24   No results for input(s): AMMONIA in the last 168 hours. Coagulation profile Recent Labs  Lab 12/14/18 2310  INR 1.15    CBC: Recent Labs  Lab 12/14/18 1352  WBC 6.9  HGB 11.1*  HCT 31.2*  MCV 81.0  PLT 113*   Cardiac Enzymes: No results for input(s): CKTOTAL, CKMB, CKMBINDEX, TROPONINI in the last 168 hours. BNP (last 3 results) No results for input(s): PROBNP  in the last 8760 hours. CBG: No results for input(s): GLUCAP in the last 168 hours. D-Dimer: No results for input(s): DDIMER in the last 72 hours. Hgb A1c: No results for input(s): HGBA1C in the last 72 hours. Lipid Profile: No results for input(s): CHOL, HDL, LDLCALC, TRIG, CHOLHDL, LDLDIRECT in the last 72 hours. Thyroid function studies: No results for input(s): TSH, T4TOTAL, T3FREE, THYROIDAB in the last 72 hours.  Invalid input(s): FREET3 Anemia work up: No results for input(s): VITAMINB12, FOLATE, FERRITIN, TIBC, IRON, RETICCTPCT in the last 72 hours. Sepsis Labs: Recent Labs  Lab 12/14/18 1352  WBC 6.9     Microbiology No results found for this or any previous visit (from the past 240 hour(s)).  Procedures and diagnostic studies:  Mr 3d Recon At Scanner  Result Date: 12/15/2018 CLINICAL DATA:  Epigastric and upper abdominal pain. Cholelithiasis on ultrasound. Common bile duct dilatation. EXAM: MRI ABDOMEN WITHOUT AND WITH CONTRAST (INCLUDING MRCP) TECHNIQUE: Multiplanar multisequence MR imaging of the abdomen was performed both before and after the administration of intravenous contrast. Heavily T2-weighted images of the biliary and pancreatic ducts were obtained, and three-dimensional MRCP images were rendered by post processing. CONTRAST:  10 mL Gadavist COMPARISON:  Ultrasound 12/14/2018 FINDINGS: Lower chest:  Lung bases are clear. Hepatobiliary: Multiple gallstones fill the lumen of the gallbladder. Stones range in size from 3 to 10 mm and number approximately 50. No gallbladder wall thickening. No pericholecystic fluid. No gallbladder distension The common bile duct is normal caliber. Measuring 5 mm. There are no filling defects within the common bile duct. There is no intrahepatic biliary duct dilatation. Normal hepatic parenchyma.  No evidence of hepatic steatosis There are 2 lesions in the hepatic parenchyma which are hyperintense on T2 weighted imaging (image 17 and 7 of series 23. These lesions have questionable peripheral enhancement. Pancreas: Normal pancreatic parenchymal intensity. No ductal dilatation or inflammation. Spleen: Normal spleen. Adrenals/urinary tract: Adrenal glands and kidneys are normal. Stomach/Bowel: Stomach and limited of the small bowel is unremarkable Vascular/Lymphatic: Abdominal aortic normal caliber. No retroperitoneal periportal lymphadenopathy. Musculoskeletal: No aggressive osseous lesion IMPRESSION: 1. Cholelithiasis without evidence cholecystitis. 2. No biliary duct dilatation.  No choledocholithiasis. 3. Normal pancreas. 4. Benign hepatic cysts or hemangiomas.  Electronically Signed   By: Suzy Bouchard M.D.   On: 12/15/2018 07:49   Dg Abd Acute W/chest  Result Date: 12/14/2018 CLINICAL DATA:  Abdominal pain, nausea EXAM: DG ABDOMEN ACUTE W/ 1V CHEST COMPARISON:  06/02/2018 FINDINGS: Peribronchial thickening compatible with bronchitis. Heart is normal size. No confluent opacities or effusions. Numerous gallstones noted in the right upper quadrant, likely filling the gallbladder. No evidence of bowel obstruction or free air. No organomegaly. No acute bony abnormality. IMPRESSION: Peribronchial thickening compatible with bronchitis. Gallstones fill the gallbladder. No evidence of bowel obstruction or free air. Electronically Signed   By: Rolm Baptise M.D.   On: 12/14/2018 12:17   Mr Abdomen Mrcp Moise Boring Contast  Result Date: 12/15/2018 CLINICAL DATA:  Epigastric and upper abdominal pain. Cholelithiasis on ultrasound. Common bile duct dilatation. EXAM: MRI ABDOMEN WITHOUT AND WITH CONTRAST (INCLUDING MRCP) TECHNIQUE: Multiplanar multisequence MR imaging of the abdomen was performed both before and after the administration of intravenous contrast. Heavily T2-weighted images of the biliary and pancreatic ducts were obtained, and three-dimensional MRCP images were rendered by post processing. CONTRAST:  10 mL Gadavist COMPARISON:  Ultrasound 12/14/2018 FINDINGS: Lower chest:  Lung bases are clear. Hepatobiliary: Multiple gallstones fill the lumen of the gallbladder. Stones range  in size from 3 to 10 mm and number approximately 50. No gallbladder wall thickening. No pericholecystic fluid. No gallbladder distension The common bile duct is normal caliber. Measuring 5 mm. There are no filling defects within the common bile duct. There is no intrahepatic biliary duct dilatation. Normal hepatic parenchyma.  No evidence of hepatic steatosis There are 2 lesions in the hepatic parenchyma which are hyperintense on T2 weighted imaging (image 17 and 7 of series 23. These lesions have  questionable peripheral enhancement. Pancreas: Normal pancreatic parenchymal intensity. No ductal dilatation or inflammation. Spleen: Normal spleen. Adrenals/urinary tract: Adrenal glands and kidneys are normal. Stomach/Bowel: Stomach and limited of the small bowel is unremarkable Vascular/Lymphatic: Abdominal aortic normal caliber. No retroperitoneal periportal lymphadenopathy. Musculoskeletal: No aggressive osseous lesion IMPRESSION: 1. Cholelithiasis without evidence cholecystitis. 2. No biliary duct dilatation.  No choledocholithiasis. 3. Normal pancreas. 4. Benign hepatic cysts or hemangiomas. Electronically Signed   By: Suzy Bouchard M.D.   On: 12/15/2018 07:49   US Abdomen Limited Ruq  Result Date: 12/14/2018 CLINICAL DATA:  Upper abdominal pain EXAM: ULTRASOUND ABDOMEN LIMITED RIGHT UPPER QUADRANT COMPARISON:  None. FINDINGS: Gallbladder: Within the gallbladder, there are multiple echogenic foci which move and shadow consistent with cholelithiasis. Largest gallstone measures 9 mm in length. The gallbladder wall is not appreciably thickened, and there is no pericholecystic fluid. No sonographic Murphy sign noted by sonographer. Common bile duct: Diameter: 9 mm which is enlarged. No biliary duct mass or calculus evident. No appreciable intrahepatic biliary duct dilatation. Liver: No focal lesion identified. Within normal limits in parenchymal echogenicity. Portal vein is patent on color Doppler imaging with normal direction of blood flow towards the liver. IMPRESSION: Cholelithiasis. No appreciable gallbladder wall thickening or pericholecystic fluid. Prominence of the common hepatic duct without demonstrable biliary duct mass or calculus. If further evaluation of the common bile duct is felt to be warranted, MRCP nonemergently would be the imaging study of choice for further assessment. Electronically Signed   By: Lowella Grip III M.D.   On: 12/14/2018 21:02    Medications:   . ARIPiprazole   15 mg Oral QHS  . nicotine  21 mg Transdermal Daily   Continuous Infusions: . sodium chloride 125 mL/hr at 12/15/18 1003  . metronidazole 500 mg (12/15/18 0400)     LOS: 0 days   Geradine Girt  [facility]   *Please refer to [email or web address], password [email or web address] to get updated schedule on who will round on this patient, as hospitalists switch teams weekly. If 7PM-7AM, please contact night-coverage at [email or web address], password [email or web address] for any overnight needs.  12/15/2018, 11:12 AM

## 2018-12-15 NOTE — ED Notes (Signed)
Patient transported to MRI 

## 2018-12-15 NOTE — Progress Notes (Signed)
Per Dr. Blaine Hamper, is ok for blood cultures to be drawn despite patient receiving antibiotics.

## 2018-12-15 NOTE — Progress Notes (Signed)
Pt alert and oriented x4, no complaints of pain or discomfort.  Bed in low position, call bell within reach.  Bed alarms on and functioning.  Assessment done and charted.  Will continue to monitor and do hourly rounding throughout the shift 

## 2018-12-15 NOTE — Consult Note (Addendum)
Reason for Consult:cholecystitis/cholelithiasis Referring Physician: Eulogio Bear Chief complaint: Epigastric abdominal pain for 3 days, nausea, vomiting, lack of appetite  Tina Mayo is an 48 y.o. female.    HPI: Patient is a 48 year old female presented to urgent care with the above-noted complaints.  She was sent to the emergency department for further evaluation and treatment.  She reports onset of mid abdominal pain on Friday, 12/11/2018 she initially had epigastric pain.  She did better the following day 12/12/18 with no real problems.  On _0 10  . TUBAL LIGATION      Family History  Problem Relation Age of Onset  . Breast cancer Mother   . Cancer Mother   . Diabetes Mellitus II Father   . Diabetes Father   . Breast cancer Maternal Grandmother     Social History:  reports that she has been smoking cigarettes. She has been smoking about 0.10 packs per day. She uses smokeless tobacco. She reports that she does not drink alcohol or use drugs.  Tobacco: Less than a pack a day on and off for 29 years. EtOH: Social Drugs: None She is separated In a work Tourist information centre manager at [employer].  Allergies:  Allergies  Allergen Reactions  . Levaquin [Levofloxacin In D5w] Anaphylaxis  . Penicillins Swelling    DID THE REACTION INVOLVE: Swelling of the face/tongue/throat, SOB, or low BP? Yes Sudden or severe rash/hives, skin peeling, or the inside of the mouth or nose? No Did it require medical treatment? Yes When did it last happen?8 years ago If all above answers are "NO", may  proceed with cephalosporin use.     Medications:  Prior to Admission:  Medications Prior to Admission  Medication Sig Dispense Refill Last Dose  . acetaminophen (TYLENOL) 650 MG CR tablet Take 650 mg by mouth every 4 (four) hours as needed for pain.   Past Week at Unknown time  . ARIPiprazole (ABILIFY) 15 MG tablet Take 15 mg by mouth at bedtime.  1 Past Week at Unknown time  . ibuprofen (ADVIL,MOTRIN) 200 MG tablet Take 800 mg by mouth every 6 (six) hours as needed for headache or moderate pain.    Past Week at Unknown time  . meloxicam (MOBIC) 15 MG tablet Take 1 tablet  (15 mg total) by mouth daily. 30 tablet 0 Past Week at Unknown time  . sertraline (ZOLOFT) 25 MG tablet Take 25 mg by mouth at bedtime.   1 Past Week at Unknown time  . traMADol (ULTRAM) 50 MG tablet Take 1 tablet (50 mg total) by mouth every 6 (six) hours as needed. 15 tablet 0 Past Week at Unknown time  . ondansetron (ZOFRAN-ODT) 4 MG disintegrating tablet Take 1 tablet (4 mg total) by mouth every 8 (eight) hours as needed for nausea or vomiting. 15 tablet 0    Scheduled: . ARIPiprazole  15 mg Oral QHS  . nicotine  21 mg Transdermal Daily   Continuous: . sodium chloride 125 mL/hr at 12/15/18 1003  . metronidazole 500 mg (12/15/18 0400)   Anti-infectives (From admission, onward)   Start     Dose/Rate Route Frequency Ordered Stop   12/15/18 0330  metroNIDAZOLE (FLAGYL) IVPB 500 mg     500 mg 100 mL/hr over 60 Minutes Intravenous Every 8 hours 12/15/18 0317        Results for orders placed or performed during the hospital encounter of 12/14/18 (from the past 48 hour(s))  Lipase, blood     Status: None   Collection Time: 12/14/18  1:52 PM  Result Value Ref Range   Lipase 24 11 - 51 U/L    Comment: Performed at [facility], [place] [address] [place], [place] [address]  Comprehensive metabolic panel     Status: Abnormal   Collection Time: 12/14/18  1:52 PM  Result Value Ref Range   Sodium 140 135 - 145 mmol/L   Potassium 4.2 3.5 - 5.1 mmol/L   Chloride 105 98 - 111 mmol/L   CO2 26 22 - 32 mmol/L   Glucose, Bld 87 70 - 99 mg/dL   BUN 5 (L) 6 - 20 mg/dL   Creatinine, Ser 0.83 0.44 - 1.00 mg/dL   Calcium 9.1 8.9 - 10.3 mg/dL   Total Protein 7.6 6.5 - 8.1 g/dL   Albumin 4.0 3.5 - 5.0 g/dL   AST 150 (H) 15 - 41 U/L   ALT 200 (H) 0 - 44 U/L   Alkaline Phosphatase 131 (H) 38 - 126 U/L   Total Bilirubin 8.5 (H) 0.3 - 1.2 mg/dL   GFR calc non Af Amer >60 >60 mL/min   GFR calc Af Amer >60 >60 mL/min   Anion gap 9 5 - 15    Comment: Performed at [facility]  [address] [place] [address]  CBC     Status: Abnormal   Collection Time: 12/14/18  1:52 PM  Result Value Ref Range   WBC 6.9 4.0 - 10.5 K/uL   RBC 3.85 (L) 3.87 - 5.11 MIL/uL   Hemoglobin 11.1 (L) 12.0 - 15.0 g/dL  HCT 31.2 (L) 36.0 - 46.0 %   MCV 81.0 80.0 - 100.0 fL   MCH 28.8 26.0 - 34.0 pg   MCHC 35.6 30.0 - 36.0 g/dL   RDW 14.6 11.5 - 15.5 %   Platelets 113 (L) 150 - 400 K/uL    Comment: REPEATED TO VERIFY PLATELET COUNT CONFIRMED BY SMEAR Immature Platelet Fraction may be clinically indicated, consider ordering this additional test [id number]    nRBC 0.0 0.0 - 0.2 %    Comment: Performed at [facility] [address], [place], [place] [address]  I-Stat beta hCG blood, ED     Status: None   Collection Time: 12/14/18  2:06 PM  Result Value Ref Range   I-stat hCG, quantitative <5.0 <5 mIU/mL   Comment 3            Comment:   GEST. AGE      CONC.  (mIU/mL)   <=1 WEEK        5 - 50     2 WEEKS       50 - 500     3 WEEKS       100 - 10,000     4 WEEKS     1,000 - 30,000        FEMALE AND NON-PREGNANT FEMALE:     LESS THAN 5 mIU/mL   Urinalysis, Routine w reflex microscopic     Status: Abnormal   Collection Time: 12/14/18  5:55 PM  Result Value Ref Range   Color, Urine AMBER (A) YELLOW    Comment: BIOCHEMICALS MAY BE AFFECTED BY COLOR   APPearance HAZY (A) CLEAR   Specific Gravity, Urine 1.018 1.005 - 1.030   pH 6.0 5.0 - 8.0   Glucose, UA NEGATIVE NEGATIVE mg/dL   Hgb urine dipstick NEGATIVE NEGATIVE   Bilirubin Urine MODERATE (A) NEGATIVE   Ketones, ur 5 (A) NEGATIVE mg/dL   Protein, ur 30 (A) NEGATIVE mg/dL   Nitrite NEGATIVE NEGATIVE   Leukocytes, UA NEGATIVE NEGATIVE   RBC / HPF 0-5 0 - 5 RBC/hpf   WBC, UA 0-5 0 - 5 WBC/hpf   Bacteria, UA RARE (A) NONE SEEN   Squamous Epithelial / LPF 21-50 0 - 5   Mucus PRESENT     Comment: Performed at [facility], [address] [place], [place] [address]  Protime-INR     Status: None   Collection Time:  12/14/18 11:10 PM  Result Value Ref Range   Prothrombin Time 14.6 11.4 - 15.2 seconds   INR 1.15     Comment: Performed at [facility] [address] [place], [place] [address]  APTT     Status: None   Collection Time: 12/14/18 11:10 PM  Result Value Ref Range   aPTT 35 24 - 36 seconds    Comment: Performed at [facility] [address], [place], [place] [address]  Comprehensive metabolic panel     Status: Abnormal   Collection Time: 12/15/18  5:59 AM  Result Value Ref Range   Sodium 141 135 - 145 mmol/L   Potassium 3.6 3.5 - 5.1 mmol/L   Chloride 111 98 - 111 mmol/L   CO2 24 22 - 32 mmol/L   Glucose, Bld 106 (H) 70 - 99 mg/dL   BUN <5 (L) 6 - 20 mg/dL   Creatinine, Ser 0.95 0.44 - 1.00 mg/dL   Calcium 8.4 (L) 8.9 - 10.3 mg/dL   Total  Protein 6.5 6.5 - 8.1 g/dL   Albumin 3.3 (L) 3.5 - 5.0 g/dL   AST 97 (H) 15 - 41 U/L   ALT 152 (H) 0 - 44 U/L   Alkaline Phosphatase 115 38 - 126 U/L   Total Bilirubin 3.8 (H) 0.3 - 1.2 mg/dL    Comment: DELTA CHECK NOTED   GFR calc non Af Amer >60 >60 mL/min   GFR calc Af Amer >60 >60 mL/min   Anion gap 6 5 - 15    Comment: Performed at [facility] [address] [place], [place] [address]    Mr 3d Recon At Scanner  Result Date: 12/15/2018 CLINICAL DATA:  Epigastric and upper abdominal pain. Cholelithiasis on ultrasound. Common bile duct dilatation. EXAM: MRI ABDOMEN WITHOUT AND WITH CONTRAST (INCLUDING MRCP) TECHNIQUE: Multiplanar multisequence MR imaging of the abdomen was performed both before and after the administration of intravenous contrast. Heavily T2-weighted images of the biliary and pancreatic ducts were obtained, and three-dimensional MRCP images were rendered by post processing. CONTRAST:  10 mL Gadavist COMPARISON:  Ultrasound 12/14/2018 FINDINGS: Lower chest:  Lung bases are clear. Hepatobiliary: Multiple gallstones fill the lumen of the gallbladder. Stones range in size from 3 to 10 mm and number  approximately 50. No gallbladder wall thickening. No pericholecystic fluid. No gallbladder distension The common bile duct is normal caliber. Measuring 5 mm. There are no filling defects within the common bile duct. There is no intrahepatic biliary duct dilatation. Normal hepatic parenchyma.  No evidence of hepatic steatosis There are 2 lesions in the hepatic parenchyma which are hyperintense on T2 weighted imaging (image 17 and 7 of series 23. These lesions have questionable peripheral enhancement. Pancreas: Normal pancreatic parenchymal intensity. No ductal dilatation or inflammation. Spleen: Normal spleen. Adrenals/urinary tract: Adrenal glands and kidneys are normal. Stomach/Bowel: Stomach and limited of the small bowel is unremarkable Vascular/Lymphatic: Abdominal aortic normal caliber. No retroperitoneal periportal lymphadenopathy. Musculoskeletal: No aggressive osseous lesion IMPRESSION: 1. Cholelithiasis without evidence cholecystitis. 2. No biliary duct dilatation.  No choledocholithiasis. 3. Normal pancreas. 4. Benign hepatic cysts or hemangiomas. Electronically Signed   By: Suzy Bouchard M.D.   On: 12/15/2018 07:49   Dg Abd Acute W/chest  Result Date: 12/14/2018 CLINICAL DATA:  Abdominal pain, nausea EXAM: DG ABDOMEN ACUTE W/ 1V CHEST COMPARISON:  06/02/2018 FINDINGS: Peribronchial thickening compatible with bronchitis. Heart is normal size. No confluent opacities or effusions. Numerous gallstones noted in the right upper quadrant, likely filling the gallbladder. No evidence of bowel obstruction or free air. No organomegaly. No acute bony abnormality. IMPRESSION: Peribronchial thickening compatible with bronchitis. Gallstones fill the gallbladder. No evidence of bowel obstruction or free air. Electronically Signed   By: Rolm Baptise M.D.   On: 12/14/2018 12:17   Mr Abdomen Mrcp Moise Boring Contast  Result Date: 12/15/2018 CLINICAL DATA:  Epigastric and upper abdominal pain. Cholelithiasis on ultrasound.  Common bile duct dilatation. EXAM: MRI ABDOMEN WITHOUT AND WITH CONTRAST (INCLUDING MRCP) TECHNIQUE: Multiplanar multisequence MR imaging of the abdomen was performed both before and after the administration of intravenous contrast. Heavily T2-weighted images of the biliary and pancreatic ducts were obtained, and three-dimensional MRCP images were rendered by post processing. CONTRAST:  10 mL Gadavist COMPARISON:  Ultrasound 12/14/2018 FINDINGS: Lower chest:  Lung bases are clear. Hepatobiliary: Multiple gallstones fill the lumen of the gallbladder. Stones range in size from 3 to 10 mm and number approximately 50. No gallbladder wall thickening. No pericholecystic fluid. No gallbladder distension The common  bile duct is normal caliber. Measuring 5 mm. There are no filling defects within the common bile duct. There is no intrahepatic biliary duct dilatation. Normal hepatic parenchyma.  No evidence of hepatic steatosis There are 2 lesions in the hepatic parenchyma which are hyperintense on T2 weighted imaging (image 17 and 7 of series 23. These lesions have questionable peripheral enhancement. Pancreas: Normal pancreatic parenchymal intensity. No ductal dilatation or inflammation. Spleen: Normal spleen. Adrenals/urinary tract: Adrenal glands and kidneys are normal. Stomach/Bowel: Stomach and limited of the small bowel is unremarkable Vascular/Lymphatic: Abdominal aortic normal caliber. No retroperitoneal periportal lymphadenopathy. Musculoskeletal: No aggressive osseous lesion IMPRESSION: 1. Cholelithiasis without evidence cholecystitis. 2. No biliary duct dilatation.  No choledocholithiasis. 3. Normal pancreas. 4. Benign hepatic cysts or hemangiomas. Electronically Signed   By: Suzy Bouchard M.D.   On: 12/15/2018 07:49   US Abdomen Limited Ruq  Result Date: 12/14/2018 CLINICAL DATA:  Upper abdominal pain EXAM: ULTRASOUND ABDOMEN LIMITED RIGHT UPPER QUADRANT COMPARISON:  None. FINDINGS: Gallbladder: Within the  gallbladder, there are multiple echogenic foci which move and shadow consistent with cholelithiasis. Largest gallstone measures 9 mm in length. The gallbladder wall is not appreciably thickened, and there is no pericholecystic fluid. No sonographic Murphy sign noted by sonographer. Common bile duct: Diameter: 9 mm which is enlarged. No biliary duct mass or calculus evident. No appreciable intrahepatic biliary duct dilatation. Liver: No focal lesion identified. Within normal limits in parenchymal echogenicity. Portal vein is patent on color Doppler imaging with normal direction of blood flow towards the liver. IMPRESSION: Cholelithiasis. No appreciable gallbladder wall thickening or pericholecystic fluid. Prominence of the common hepatic duct without demonstrable biliary duct mass or calculus. If further evaluation of the common bile duct is felt to be warranted, MRCP nonemergently would be the imaging study of choice for further assessment. Electronically Signed   By: Lowella Grip III M.D.   On: 12/14/2018 21:02    Review of Systems  Constitutional: Negative.   HENT: Negative.   Eyes: Negative.   Respiratory: Negative.   Cardiovascular: Negative.   Gastrointestinal: Positive for abdominal pain, constipation, heartburn, nausea and vomiting. Negative for blood in stool, diarrhea and melena.  Genitourinary: Negative.   Musculoskeletal: Positive for back pain and joint pain (Shoulder pain with some kind of degenerative bone issue.).  Skin: Negative.   Neurological: Negative.   Endo/Heme/Allergies: Negative.   Psychiatric/Behavioral: Positive for depression. The patient is nervous/anxious.    Blood pressure 126/78, pulse 78, temperature 97.9 F (36.6 C), temperature source Oral, resp. rate 19, height _0  (1.702 m), weight 90.7 kg, last menstrual period 01/12/2017, SpO2 95 %. Physical Exam  Constitutional: She appears well-developed and well-nourished. No distress.  Feeling better but still  having pain in the right side especially the right upper quadrant.  HENT:  Head: Normocephalic and atraumatic.  Mouth/Throat: Oropharynx is clear and moist. No oropharyngeal exudate.  Eyes: Right eye exhibits no discharge. Left eye exhibits no discharge. No scleral icterus.  Pupils are equal  Neck: Normal range of motion. Neck supple. No JVD present. No tracheal deviation present. No thyromegaly present.  Cardiovascular: Normal rate, regular rhythm, normal heart sounds and intact distal pulses.  No murmur heard. Respiratory: Breath sounds normal. No respiratory distress. She has no wheezes. She has no rales. She exhibits no tenderness.  GI: Soft. She exhibits no distension and no mass. There is abdominal tenderness (Right upper quadrant more than the right lower quadrant.  No pain on the left.). There is no  rebound and no guarding.  Musculoskeletal:        General: No tenderness or edema.  Lymphadenopathy:    She has no cervical adenopathy.  Neurological: She is alert. No cranial nerve deficit.  Skin: Skin is warm and dry. No rash noted. She is not diaphoretic. No erythema. No pallor.  Psychiatric: She has a normal mood and affect. Her behavior is normal. Judgment and thought content normal.    Assessment/Plan: Cholelithiasis/cholecystitis Hx anxiety/depression Hx sickle cell trait Hx Bell's palsy  Plan: Looks like the patient has cholelithiasis and most likely passing stones.  LFTs are improving;  MRCP is negative.  Recommend laparoscopic cholecystectomy and probable intraoperative cholangiogram.  Labs are ordered for tomorrow morning postop.  Jarion Hawthorne 12/15/2018, 10:32 AM

## 2018-12-15 NOTE — Consult Note (Addendum)
Reason for Consult: Possible choledocholithiasis Referring Physician: [facility]  [name] HPI: This is a 48 year old female with a PMH of cholelithiasis admitted with RUQ pain, epigastric pain, abnormal liver enzymes, and dilated CBD.  She states that she started to have acute symptoms of abdominal pain, nausea, and vomiting.  The patient had milder symptoms in 2018, but she was having more back pain at that time.  The patient was evaluated by surgery or her PCP, it is not clear and the decision was made that she did not require surgery.  With the persistence of her symptoms she presented to the ER and she was found to have elevated liver enzymes as well as a dilated CBD up to 9 mm.  There were multiple gallstones, but no evidence of cholecystitis.  Past Medical History:  Diagnosis Date  . Anxiety   . Asthma   . Bell's palsy   . Depression   . Sickle cell trait [facility])     Past Surgical History:  Procedure Laterality Date  . benign breast tumor removed     2006  . BREAST BIOPSY Right 2014   benign  . BREAST EXCISIONAL BIOPSY Right 2007   benign   . right ankle surgery     2010  . TUBAL LIGATION      Family History  Problem Relation Age of Onset  . Breast cancer Mother   . Cancer Mother   . Diabetes Mellitus II Father   . Diabetes Father   . Breast cancer Maternal Grandmother     Social History:  reports that she has been smoking cigarettes. She has been smoking about 0.10 packs per day. She uses smokeless tobacco. She reports that she does not drink alcohol or use drugs.  Allergies:  Allergies  Allergen Reactions  . Levaquin [Levofloxacin In D5w] Anaphylaxis  . Penicillins Swelling    DID THE REACTION INVOLVE: Swelling of the face/tongue/throat, SOB, or low BP? Yes Sudden or severe rash/hives, skin peeling, or the inside of the mouth or nose? No Did it require medical treatment? Yes When did it last happen?8 years ago If all above answers are "NO",  may proceed with cephalosporin use.     Medications:  Scheduled: . ARIPiprazole  15 mg Oral QHS  . nicotine  21 mg Transdermal Daily   Continuous: . sodium chloride 125 mL/hr at 12/14/18 2347  . metronidazole 500 mg (12/15/18 0400)    Results for orders placed or performed during the hospital encounter of 12/14/18 (from the past 24 hour(s))  Lipase, blood     Status: None   Collection Time: 12/14/18  1:52 PM  Result Value Ref Range   Lipase 24 11 - 51 U/L  Comprehensive metabolic panel     Status: Abnormal   Collection Time: 12/14/18  1:52 PM  Result Value Ref Range   Sodium 140 135 - 145 mmol/L   Potassium 4.2 3.5 - 5.1 mmol/L   Chloride 105 98 - 111 mmol/L   CO2 26 22 - 32 mmol/L   Glucose, Bld 87 70 - 99 mg/dL   BUN 5 (L) 6 - 20 mg/dL   Creatinine, Ser 0.83 0.44 - 1.00 mg/dL   Calcium 9.1 8.9 - 10.3 mg/dL   Total Protein 7.6 6.5 - 8.1 g/dL   Albumin 4.0 3.5 - 5.0 g/dL   AST 150 (H) 15 - 41 U/L   ALT 200 (H) 0 - 44 U/L   Alkaline Phosphatase 131 (H) 38 -  126 U/L   Total Bilirubin 8.5 (H) 0.3 - 1.2 mg/dL   GFR calc non Af Amer >60 >60 mL/min   GFR calc Af Amer >60 >60 mL/min   Anion gap 9 5 - 15  CBC     Status: Abnormal   Collection Time: 12/14/18  1:52 PM  Result Value Ref Range   WBC 6.9 4.0 - 10.5 K/uL   RBC 3.85 (L) 3.87 - 5.11 MIL/uL   Hemoglobin 11.1 (L) 12.0 - 15.0 g/dL   HCT 31.2 (L) 36.0 - 46.0 %   MCV 81.0 80.0 - 100.0 fL   MCH 28.8 26.0 - 34.0 pg   MCHC 35.6 30.0 - 36.0 g/dL   RDW 14.6 11.5 - 15.5 %   Platelets 113 (L) 150 - 400 K/uL   nRBC 0.0 0.0 - 0.2 %  I-Stat beta hCG blood, ED     Status: None   Collection Time: 12/14/18  2:06 PM  Result Value Ref Range   I-stat hCG, quantitative <5.0 <5 mIU/mL   Comment 3          Urinalysis, Routine w reflex microscopic     Status: Abnormal   Collection Time: 12/14/18  5:55 PM  Result Value Ref Range   Color, Urine AMBER (A) YELLOW   APPearance HAZY (A) CLEAR   Specific Gravity, Urine 1.018 1.005 -  1.030   pH 6.0 5.0 - 8.0   Glucose, UA NEGATIVE NEGATIVE mg/dL   Hgb urine dipstick NEGATIVE NEGATIVE   Bilirubin Urine MODERATE (A) NEGATIVE   Ketones, ur 5 (A) NEGATIVE mg/dL   Protein, ur 30 (A) NEGATIVE mg/dL   Nitrite NEGATIVE NEGATIVE   Leukocytes, UA NEGATIVE NEGATIVE   RBC / HPF 0-5 0 - 5 RBC/hpf   WBC, UA 0-5 0 - 5 WBC/hpf   Bacteria, UA RARE (A) NONE SEEN   Squamous Epithelial / LPF 21-50 0 - 5   Mucus PRESENT   Protime-INR     Status: None   Collection Time: 12/14/18 11:10 PM  Result Value Ref Range   Prothrombin Time 14.6 11.4 - 15.2 seconds   INR 1.15   APTT     Status: None   Collection Time: 12/14/18 11:10 PM  Result Value Ref Range   aPTT 35 24 - 36 seconds     Dg Abd Acute W/chest  Result Date: 12/14/2018 CLINICAL DATA:  Abdominal pain, nausea EXAM: DG ABDOMEN ACUTE W/ 1V CHEST COMPARISON:  06/02/2018 FINDINGS: Peribronchial thickening compatible with bronchitis. Heart is normal size. No confluent opacities or effusions. Numerous gallstones noted in the right upper quadrant, likely filling the gallbladder. No evidence of bowel obstruction or free air. No organomegaly. No acute bony abnormality. IMPRESSION: Peribronchial thickening compatible with bronchitis. Gallstones fill the gallbladder. No evidence of bowel obstruction or free air. Electronically Signed   By: Rolm Baptise M.D.   On: 12/14/2018 12:17   US Abdomen Limited Ruq  Result Date: 12/14/2018 CLINICAL DATA:  Upper abdominal pain EXAM: ULTRASOUND ABDOMEN LIMITED RIGHT UPPER QUADRANT COMPARISON:  None. FINDINGS: Gallbladder: Within the gallbladder, there are multiple echogenic foci which move and shadow consistent with cholelithiasis. Largest gallstone measures 9 mm in length. The gallbladder wall is not appreciably thickened, and there is no pericholecystic fluid. No sonographic Murphy sign noted by sonographer. Common bile duct: Diameter: 9 mm which is enlarged. No biliary duct mass or calculus evident. No  appreciable intrahepatic biliary duct dilatation. Liver: No focal lesion identified. Within normal limits in parenchymal  echogenicity. Portal vein is patent on color Doppler imaging with normal direction of blood flow towards the liver. IMPRESSION: Cholelithiasis. No appreciable gallbladder wall thickening or pericholecystic fluid. Prominence of the common hepatic duct without demonstrable biliary duct mass or calculus. If further evaluation of the common bile duct is felt to be warranted, MRCP nonemergently would be the imaging study of choice for further assessment. Electronically Signed   By: Lowella Grip III M.D.   On: 12/14/2018 21:02    ROS:  As stated above in the HPI otherwise negative.  Blood pressure 126/78, pulse 78, temperature 97.9 F (36.6 C), temperature source Oral, resp. rate 19, height 5\' 7"  (1.702 m), weight 90.7 kg, last menstrual period 01/12/2017, SpO2 95 %.    PE: Gen: NAD, Alert and Oriented HEENT:  [place]/AT, EOMI Neck: Supple, no LAD Lungs: CTA Bilaterally CV: RRR without M/G/R ABM: Soft, tender in the epigastric and RUQ region, +BS Ext: No C/C/E  Assessment/Plan: 1) ? Choledocholithiasis. 2) Cholelithiasis. 3) ABM pain. 4) N/V. 5) Elevated liver enzymes.   Her MRCP was performed, but the results are pending.  If there are no stones in the CBD, then a surgical consultation is required for cholecystectomy.  Plan: 1) Await MRCP results. 2) Maintain NPO status.  ADDENDUM: There was no evidence of choledocholithiasis with the MRCP.  An ERCP is not required.  Consult surgery.  Signing off.  Shanitha Twining D 12/15/2018, 7:14 AM

## 2018-12-16 ENCOUNTER — Encounter (HOSPITAL_COMMUNITY): Payer: Self-pay | Admitting: Orthopedic Surgery

## 2018-12-16 ENCOUNTER — Inpatient Hospital Stay (HOSPITAL_COMMUNITY): Payer: Medicaid Other | Admitting: Anesthesiology

## 2018-12-16 ENCOUNTER — Encounter (HOSPITAL_COMMUNITY)
Admission: EM | Disposition: A | Discharge status: Home / Self Care | Payer: Self-pay | Source: Home / Self Care | Attending: Internal Medicine

## 2018-12-16 HISTORY — PX: CHOLECYSTECTOMY: SHX55

## 2018-12-16 LAB — HEPATITIS PANEL, ACUTE
Hep A IgM: NEGATIVE
Hep B C IgM: NEGATIVE
Hepatitis B Surface Ag: NEGATIVE

## 2018-12-16 LAB — CBC
HCT: 25.1 % — ABNORMAL LOW (ref 36.0–46.0)
Hemoglobin: 9 g/dL — ABNORMAL LOW (ref 12.0–15.0)
MCH: 28.8 pg (ref 26.0–34.0)
MCHC: 35.9 g/dL (ref 30.0–36.0)
MCV: 80.4 fL (ref 80.0–100.0)
Platelets: 73 10*3/uL — ABNORMAL LOW (ref 150–400)
RBC: 3.12 MIL/uL — ABNORMAL LOW (ref 3.87–5.11)
RDW: 14.3 % (ref 11.5–15.5)
WBC: 5 10*3/uL (ref 4.0–10.5)
nRBC: 0 % (ref 0.0–0.2)

## 2018-12-16 LAB — COMPREHENSIVE METABOLIC PANEL
ALT: 119 U/L — AB (ref 0–44)
AST: 60 U/L — ABNORMAL HIGH (ref 15–41)
Albumin: 3 g/dL — ABNORMAL LOW (ref 3.5–5.0)
Alkaline Phosphatase: 100 U/L (ref 38–126)
Anion gap: 5 (ref 5–15)
BUN: <5 mg/dL — ABNORMAL LOW (ref 6–20)
CO2: 24 mmol/L (ref 22–32)
Calcium: 8.4 mg/dL — ABNORMAL LOW (ref 8.9–10.3)
Chloride: 111 mmol/L (ref 98–111)
Creatinine, Ser: 0.76 mg/dL (ref 0.44–1.00)
GFR calc Af Amer: >60 mL/min (ref >60)
GFR calc non Af Amer: >60 mL/min (ref >60)
GLUCOSE: 91 mg/dL (ref 70–99)
Potassium: 3.7 mmol/L (ref 3.5–5.1)
SODIUM: 140 mmol/L (ref 135–145)
Total Bilirubin: 2.4 mg/dL — ABNORMAL HIGH (ref 0.3–1.2)
Total Protein: 5.9 g/dL — ABNORMAL LOW (ref 6.5–8.1)

## 2018-12-16 LAB — SURGICAL PCR SCREEN
MRSA, PCR: NEGATIVE
Staphylococcus aureus: NEGATIVE

## 2018-12-16 SURGERY — LAPAROSCOPIC CHOLECYSTECTOMY WITH INTRAOPERATIVE CHOLANGIOGRAM
Anesthesia: General | Site: Abdomen

## 2018-12-16 MED ORDER — 0.9 % SODIUM CHLORIDE (POUR BTL) OPTIME
TOPICAL | Status: DC | PRN
  Administered 2018-12-16: 1000 mL

## 2018-12-16 MED ORDER — IBUPROFEN 600 MG PO TABS: 600.0000 mg | ORAL_TABLET | Freq: Four times a day (QID) | ORAL | Status: DC | PRN

## 2018-12-16 MED ORDER — FENTANYL CITRATE (PF) 250 MCG/5ML IJ SOLN
INTRAMUSCULAR | Status: AC
  Filled 2018-12-16: qty 5

## 2018-12-16 MED ORDER — DEXAMETHASONE SODIUM PHOSPHATE 10 MG/ML IJ SOLN
INTRAMUSCULAR | Status: AC
  Filled 2018-12-16: qty 1

## 2018-12-16 MED ORDER — PROMETHAZINE HCL 25 MG/ML IJ SOLN: 6.2500 mg | INTRAMUSCULAR | Status: DC | PRN

## 2018-12-16 MED ORDER — SCOPOLAMINE 1 MG/3DAYS TD PT72
MEDICATED_PATCH | TRANSDERMAL | Status: AC
  Administered 2018-12-16: 1.5 mg via TRANSDERMAL
  Filled 2018-12-16: qty 1

## 2018-12-16 MED ORDER — ACETAMINOPHEN 325 MG PO TABS
650.0000 mg | ORAL_TABLET | Freq: Four times a day (QID) | ORAL | Status: DC
  Administered 2018-12-16 – 2018-12-17 (×3): 650 mg via ORAL
  Filled 2018-12-16 (×3): qty 2

## 2018-12-16 MED ORDER — ROCURONIUM BROMIDE 10 MG/ML (PF) SYRINGE
PREFILLED_SYRINGE | INTRAVENOUS | Status: DC | PRN
  Administered 2018-12-16: 30 mg via INTRAVENOUS

## 2018-12-16 MED ORDER — ONDANSETRON HCL 4 MG/2ML IJ SOLN
INTRAMUSCULAR | Status: AC
  Filled 2018-12-16: qty 2

## 2018-12-16 MED ORDER — MEPERIDINE HCL 50 MG/ML IJ SOLN: 6.2500 mg | INTRAMUSCULAR | Status: DC | PRN

## 2018-12-16 MED ORDER — DEXAMETHASONE SODIUM PHOSPHATE 4 MG/ML IJ SOLN
INTRAMUSCULAR | Status: DC | PRN
  Administered 2018-12-16: 4 mg via INTRAVENOUS

## 2018-12-16 MED ORDER — ROCURONIUM BROMIDE 50 MG/5ML IV SOSY
PREFILLED_SYRINGE | INTRAVENOUS | Status: AC
  Filled 2018-12-16: qty 5

## 2018-12-16 MED ORDER — LIDOCAINE 2% (20 MG/ML) 5 ML SYRINGE
INTRAMUSCULAR | Status: AC
  Filled 2018-12-16: qty 5

## 2018-12-16 MED ORDER — LIDOCAINE 2% (20 MG/ML) 5 ML SYRINGE
INTRAMUSCULAR | Status: DC | PRN
  Administered 2018-12-16: 60 mg via INTRAVENOUS

## 2018-12-16 MED ORDER — BUPIVACAINE-EPINEPHRINE 0.25% -1:200000 IJ SOLN
INTRAMUSCULAR | Status: DC | PRN
  Administered 2018-12-16: 20 mL

## 2018-12-16 MED ORDER — MIDAZOLAM HCL 2 MG/2ML IJ SOLN
INTRAMUSCULAR | Status: AC
  Filled 2018-12-16: qty 2

## 2018-12-16 MED ORDER — TRAMADOL HCL 50 MG PO TABS
50.0000 mg | ORAL_TABLET | Freq: Four times a day (QID) | ORAL | Status: DC | PRN
  Administered 2018-12-17: 50 mg via ORAL
  Filled 2018-12-16 (×2): qty 1

## 2018-12-16 MED ORDER — MIDAZOLAM HCL 2 MG/2ML IJ SOLN: 0.5000 mg | Freq: Once | INTRAMUSCULAR | Status: DC | PRN

## 2018-12-16 MED ORDER — SCOPOLAMINE 1 MG/3DAYS TD PT72
1.0000 | MEDICATED_PATCH | TRANSDERMAL | Status: DC
  Administered 2018-12-16: 1.5 mg via TRANSDERMAL
  Filled 2018-12-16: qty 1

## 2018-12-16 MED ORDER — SUGAMMADEX SODIUM 200 MG/2ML IV SOLN
INTRAVENOUS | Status: DC | PRN
  Administered 2018-12-16: 200 mg via INTRAVENOUS

## 2018-12-16 MED ORDER — SUCCINYLCHOLINE CHLORIDE 20 MG/ML IJ SOLN
INTRAMUSCULAR | Status: DC | PRN
  Administered 2018-12-16: 100 mg via INTRAVENOUS

## 2018-12-16 MED ORDER — PROPOFOL 10 MG/ML IV BOLUS
INTRAVENOUS | Status: DC | PRN
  Administered 2018-12-16: 180 mg via INTRAVENOUS

## 2018-12-16 MED ORDER — SODIUM CHLORIDE 0.9 % IR SOLN
Status: DC | PRN
  Administered 2018-12-16: 1000 mL

## 2018-12-16 MED ORDER — DOCUSATE SODIUM 100 MG PO CAPS
100.0000 mg | ORAL_CAPSULE | Freq: Two times a day (BID) | ORAL | Status: DC
  Administered 2018-12-16 (×2): 100 mg via ORAL
  Filled 2018-12-16 (×2): qty 1

## 2018-12-16 MED ORDER — HYDROMORPHONE HCL 1 MG/ML IJ SOLN
0.2500 mg | INTRAMUSCULAR | Status: DC | PRN
  Administered 2018-12-16 (×2): 0.5 mg via INTRAVENOUS

## 2018-12-16 MED ORDER — VANCOMYCIN HCL IN DEXTROSE 1-5 GM/200ML-% IV SOLN: 1000.0000 mg | INTRAVENOUS | Status: DC

## 2018-12-16 MED ORDER — BUPIVACAINE-EPINEPHRINE (PF) 0.25% -1:200000 IJ SOLN
INTRAMUSCULAR | Status: AC
  Filled 2018-12-16: qty 30

## 2018-12-16 MED ORDER — LACTATED RINGERS IV SOLN
INTRAVENOUS | Status: DC
  Administered 2018-12-16: 10:00:00 via INTRAVENOUS

## 2018-12-16 MED ORDER — FENTANYL CITRATE (PF) 100 MCG/2ML IJ SOLN
INTRAMUSCULAR | Status: DC | PRN
  Administered 2018-12-16 (×3): 50 ug via INTRAVENOUS

## 2018-12-16 MED ORDER — HYDROMORPHONE HCL 1 MG/ML IJ SOLN
INTRAMUSCULAR | Status: AC
  Filled 2018-12-16: qty 1

## 2018-12-16 MED ORDER — MIDAZOLAM HCL 2 MG/2ML IJ SOLN
INTRAMUSCULAR | Status: DC | PRN
  Administered 2018-12-16: 2 mg via INTRAVENOUS

## 2018-12-16 SURGICAL SUPPLY — 37 items
APPLIER CLIP 5 13 M/L LIGAMAX5 (MISCELLANEOUS) ×3
CANISTER SUCT 3000ML PPV (MISCELLANEOUS) ×3 IMPLANT
CHLORAPREP W/TINT 26ML (MISCELLANEOUS) ×3 IMPLANT
CLIP APPLIE 5 13 M/L LIGAMAX5 (MISCELLANEOUS) ×1 IMPLANT
CONT SPEC 4OZ CLIKSEAL STRL BL (MISCELLANEOUS) ×3 IMPLANT
COVER MAYO STAND STRL (DRAPES) IMPLANT
COVER SURGICAL LIGHT HANDLE (MISCELLANEOUS) ×3 IMPLANT
COVER WAND RF STERILE (DRAPES) ×1 IMPLANT
DERMABOND ADVANCED (GAUZE/BANDAGES/DRESSINGS) ×2
DERMABOND ADVANCED .7 DNX12 (GAUZE/BANDAGES/DRESSINGS) ×1 IMPLANT
DRAPE C-ARM 42X72 X-RAY (DRAPES) IMPLANT
ELECT REM PT RETURN 9FT ADLT (ELECTROSURGICAL) ×3
ELECTRODE REM PT RTRN 9FT ADLT (ELECTROSURGICAL) ×1 IMPLANT
GLOVE BIO SURGEON STRL SZ 6 (GLOVE) ×3 IMPLANT
GLOVE INDICATOR 6.5 STRL GRN (GLOVE) ×3 IMPLANT
GOWN STRL REUS W/ TWL LRG LVL3 (GOWN DISPOSABLE) ×3 IMPLANT
GOWN STRL REUS W/TWL LRG LVL3 (GOWN DISPOSABLE) ×6
GRASPER SUT TROCAR 14GX15 (MISCELLANEOUS) ×3 IMPLANT
KIT BASIN OR (CUSTOM PROCEDURE TRAY) ×3 IMPLANT
KIT TURNOVER KIT B (KITS) ×3 IMPLANT
NDL INSUFFLATION 14GA 120MM (NEEDLE) ×1 IMPLANT
NEEDLE INSUFFLATION 14GA 120MM (NEEDLE) ×3 IMPLANT
NS IRRIG 1000ML POUR BTL (IV SOLUTION) ×3 IMPLANT
PAD ARMBOARD 7.5X6 YLW CONV (MISCELLANEOUS) ×3 IMPLANT
POUCH SPECIMEN RETRIEVAL 10MM (ENDOMECHANICALS) ×3 IMPLANT
SCISSORS LAP 5X35 DISP (ENDOMECHANICALS) ×3 IMPLANT
SET CHOLANGIOGRAPH 5 50 .035 (SET/KITS/TRAYS/PACK) IMPLANT
SET IRRIG TUBING LAPAROSCOPIC (IRRIGATION / IRRIGATOR) ×3 IMPLANT
SET TUBE SMOKE EVAC HIGH FLOW (TUBING) ×3 IMPLANT
SLEEVE ENDOPATH XCEL 5M (ENDOMECHANICALS) ×6 IMPLANT
SUT MNCRL AB 4-0 PS2 18 (SUTURE) ×3 IMPLANT
TOWEL OR 17X24 6PK STRL BLUE (TOWEL DISPOSABLE) ×3 IMPLANT
TOWEL OR 17X26 10 PK STRL BLUE (TOWEL DISPOSABLE) ×1 IMPLANT
TRAY LAPAROSCOPIC MC (CUSTOM PROCEDURE TRAY) ×3 IMPLANT
TROCAR XCEL NON-BLD 11X100MML (ENDOMECHANICALS) ×3 IMPLANT
TROCAR XCEL NON-BLD 5MMX100MML (ENDOMECHANICALS) ×3 IMPLANT
WATER STERILE IRR 1000ML POUR (IV SOLUTION) ×3 IMPLANT

## 2018-12-16 NOTE — Progress Notes (Signed)
PROGRESS NOTE                                                                                                                                                                                                             Patient Demographics:    Tina Mayo, is a 48 y.o. female, DOB - 12/15/1970, XHB:[id number]  Admit date - 12/14/2018   Admitting Physician Ivor Costa, MD  Outpatient Primary MD for the patient is Pa, [place]  LOS - 1  Outpatient Specialists:   Chief Complaint  Patient presents with  . Abdominal Pain       Brief Narrative   48 year old female with history of asthma, sickle cell trait, tobacco abuse, depression, chronic thrombocytopenia presented with abdominal pain, nausea and vomiting with elevated LFTs.  MRCP showed stones in CBD.  General surgery consulted and patient underwent laparoscopic cholecystectomy on 1/8.   Subjective:   Patient seen after returning from the OR.  Feels sore in the abdomen and bloated.   Assessment  & Plan :    Principal Problem:   Choledocholithiasis MRCP negative for CBD stone and findings of chronic cholecystitis.  Underwent laparoscopic cholecystectomy.  Tolerated well.  Start on clears and advance diet as tolerated.  Pain control PRN and antiemetics.  LFTs trending down.  Recheck in a.m. Can DC IV fluids later today.   Active Problems:   Thrombocytopenia, unspecified ([place]) Platelets normal normal.  Follow as outpatient.    Depression Continue home dose Abilify    Tobacco abuse Continue nicotine patch.        Code Status : Full code  Family Communication  : None at bedside  Disposition Plan  : Home in a.m. if stable overnight and tolerating advance diet  Barriers For Discharge : Active symptoms, postop  Consults  : GI, [place] surgery  Procedures  : MRCP, laparoscopic cholecystectomy  DVT Prophylaxis  : SCDs  Lab Results  Component Value  Date   PLT 73 (L) 12/16/2018    Antibiotics  :    Anti-infectives (From admission, onward)   Start     Dose/Rate Route Frequency Ordered Stop   12/16/18 0900  vancomycin (VANCOCIN) IVPB 1000 mg/200 mL premix  Status:  Discontinued     1,000 mg 200 mL/hr over 60 Minutes Intravenous On call to O.R. 12/16/18 8101 12/16/18 7510  12/16/18 0800  vancomycin (VANCOCIN) 1,500 mg in sodium chloride 0.9 % 500 mL IVPB     1,500 mg 250 mL/hr over 120 Minutes Intravenous  Once 12/15/18 1526 12/16/18 1045   12/15/18 0330  metroNIDAZOLE (FLAGYL) IVPB 500 mg  Status:  Discontinued     500 mg 100 mL/hr over 60 Minutes Intravenous Every 8 hours 12/15/18 0317 12/15/18 1117        Objective:   Vitals:   12/16/18 1315 12/16/18 1330 12/16/18 1345 12/16/18 1418  BP: 121/72 115/69 122/78 120/83  Pulse: 76 69 66 80  Resp: (!) 22 15 17 18   Temp:  (!) 97.2 F (36.2 C)  98.5 F (36.9 C)  TempSrc:    Oral  SpO2: 97% 97% 98% 99%  Weight:      Height:        Wt Readings from Last 3 Encounters:  12/16/18 90.7 kg  12/16/17 93.4 kg  02/05/17 91.6 kg     Intake/Output Summary (Last 24 hours) at 12/16/2018 1544 Last data filed at 12/16/2018 1200 Gross per 24 hour  Intake 2190 ml  Output 410 ml  Net 1780 ml     Physical Exam  Gen: not in distress, fatigued HEENT: Pallor present, moist mucosa, supple neck Chest: clear b/l, no added sounds CVS: N S1&S2, no murmurs, GI: soft, nondistended, laparoscopic site appears clean, minimal tenderness, bowel sounds present Musculoskeletal: warm, no edema    Data Review:    CBC Recent Labs  Lab 12/14/18 1352 12/16/18 0400  WBC 6.9 5.0  HGB 11.1* 9.0*  HCT 31.2* 25.1*  PLT 113* 73*  MCV 81.0 80.4  MCH 28.8 28.8  MCHC 35.6 35.9  RDW 14.6 14.3    Chemistries  Recent Labs  Lab 12/14/18 1352 12/15/18 0559 12/16/18 0400  NA 140 141 140  K 4.2 3.6 3.7  CL 105 111 111  CO2 26 24 24   GLUCOSE 87 106* 91  BUN 5* <5* <5*  CREATININE 0.83 0.95  0.76  CALCIUM 9.1 8.4* 8.4*  AST 150* 97* 60*  ALT 200* 152* 119*  ALKPHOS 131* 115 100  BILITOT 8.5* 3.8* 2.4*   ------------------------------------------------------------------------------------------------------------------ No results for input(s): CHOL, HDL, LDLCALC, TRIG, CHOLHDL, LDLDIRECT in the last 72 hours.  No results found for: HGBA1C ------------------------------------------------------------------------------------------------------------------ No results for input(s): TSH, T4TOTAL, T3FREE, THYROIDAB in the last 72 hours.  Invalid input(s): FREET3 ------------------------------------------------------------------------------------------------------------------ No results for input(s): VITAMINB12, FOLATE, FERRITIN, TIBC, IRON, RETICCTPCT in the last 72 hours.  Coagulation profile Recent Labs  Lab 12/14/18 2310  INR 1.15    No results for input(s): DDIMER in the last 72 hours.  Cardiac Enzymes No results for input(s): CKMB, TROPONINI, MYOGLOBIN in the last 168 hours.  Invalid input(s): CK ------------------------------------------------------------------------------------------------------------------ No results found for: BNP  Inpatient Medications  Scheduled Meds: . acetaminophen  650 mg Oral Q6H  . ARIPiprazole  15 mg Oral QHS  . docusate sodium  100 mg Oral BID  . HYDROmorphone      . nicotine  21 mg Transdermal Daily  . scopolamine  1 patch Transdermal Q72H   Continuous Infusions: . sodium chloride Stopped (12/16/18 1013)  . lactated ringers     PRN Meds:.albuterol, ibuprofen, morphine injection, ondansetron (ZOFRAN) IV, polyethylene glycol, traMADol, zolpidem  Micro Results Recent Results (from the past 240 hour(s))  Culture, blood (Routine X 2) w Reflex to ID Panel     Status: None (Preliminary result)   Collection Time: 12/15/18  5:46 AM  Result Value  Ref Range Status   Specimen Description BLOOD LEFT ARM  Final   Special Requests   Final     BOTTLES DRAWN AEROBIC AND ANAEROBIC Blood Culture adequate volume   Culture   Final    NO GROWTH 1 DAY Performed at [facility], [address] [place], [place] [address]    Report Status PENDING  Incomplete  Culture, blood (Routine X 2) w Reflex to ID Panel     Status: None (Preliminary result)   Collection Time: 12/15/18  5:53 AM  Result Value Ref Range Status   Specimen Description BLOOD LEFT ARM  Final   Special Requests   Final    BOTTLES DRAWN AEROBIC AND ANAEROBIC Blood Culture results may not be optimal due to an excessive volume of blood received in culture bottles   Culture   Final    NO GROWTH 1 DAY Performed at [facility], [place] [address] [place], [place] [address]    Report Status PENDING  Incomplete  Surgical pcr screen     Status: None   Collection Time: 12/16/18  8:45 AM  Result Value Ref Range Status   MRSA, PCR NEGATIVE NEGATIVE Final   Staphylococcus aureus NEGATIVE NEGATIVE Final    Comment: (NOTE) The Xpert SA Assay (FDA approved for NASAL specimens in patients 81 years of age and older), is one component of a comprehensive surveillance program. It is not intended to diagnose infection nor to guide or monitor treatment. Performed at [facility], [place] [address] [place], [place] [address]     Radiology Reports Mr 3d Recon At Scanner  Result Date: 12/15/2018 CLINICAL DATA:  Epigastric and upper abdominal pain. Cholelithiasis on ultrasound. Common bile duct dilatation. EXAM: MRI ABDOMEN WITHOUT AND WITH CONTRAST (INCLUDING MRCP) TECHNIQUE: Multiplanar multisequence MR imaging of the abdomen was performed both before and after the administration of intravenous contrast. Heavily T2-weighted images of the biliary and pancreatic ducts were obtained, and three-dimensional MRCP images were rendered by post processing. CONTRAST:  10 mL Gadavist COMPARISON:  Ultrasound 12/14/2018 FINDINGS: Lower chest:  Lung bases are clear. Hepatobiliary:  Multiple gallstones fill the lumen of the gallbladder. Stones range in size from 3 to 10 mm and number approximately 50. No gallbladder wall thickening. No pericholecystic fluid. No gallbladder distension The common bile duct is normal caliber. Measuring 5 mm. There are no filling defects within the common bile duct. There is no intrahepatic biliary duct dilatation. Normal hepatic parenchyma.  No evidence of hepatic steatosis There are 2 lesions in the hepatic parenchyma which are hyperintense on T2 weighted imaging (image 17 and 7 of series 23. These lesions have questionable peripheral enhancement. Pancreas: Normal pancreatic parenchymal intensity. No ductal dilatation or inflammation. Spleen: Normal spleen. Adrenals/urinary tract: Adrenal glands and kidneys are normal. Stomach/Bowel: Stomach and limited of the small bowel is unremarkable Vascular/Lymphatic: Abdominal aortic normal caliber. No retroperitoneal periportal lymphadenopathy. Musculoskeletal: No aggressive osseous lesion IMPRESSION: 1. Cholelithiasis without evidence cholecystitis. 2. No biliary duct dilatation.  No choledocholithiasis. 3. Normal pancreas. 4. Benign hepatic cysts or hemangiomas. Electronically Signed   By: Suzy Bouchard M.D.   On: 12/15/2018 07:49   Dg Abd Acute W/chest  Result Date: 12/14/2018 CLINICAL DATA:  Abdominal pain, nausea EXAM: DG ABDOMEN ACUTE W/ 1V CHEST COMPARISON:  06/02/2018 FINDINGS: Peribronchial thickening compatible with bronchitis. Heart is normal size. No confluent opacities or effusions. Numerous gallstones noted in the right upper quadrant, likely filling the gallbladder. No evidence of bowel  obstruction or free air. No organomegaly. No acute bony abnormality. IMPRESSION: Peribronchial thickening compatible with bronchitis. Gallstones fill the gallbladder. No evidence of bowel obstruction or free air. Electronically Signed   By: Rolm Baptise M.D.   On: 12/14/2018 12:17   Mr Abdomen Mrcp Moise Boring  Contast  Result Date: 12/15/2018 CLINICAL DATA:  Epigastric and upper abdominal pain. Cholelithiasis on ultrasound. Common bile duct dilatation. EXAM: MRI ABDOMEN WITHOUT AND WITH CONTRAST (INCLUDING MRCP) TECHNIQUE: Multiplanar multisequence MR imaging of the abdomen was performed both before and after the administration of intravenous contrast. Heavily T2-weighted images of the biliary and pancreatic ducts were obtained, and three-dimensional MRCP images were rendered by post processing. CONTRAST:  10 mL Gadavist COMPARISON:  Ultrasound 12/14/2018 FINDINGS: Lower chest:  Lung bases are clear. Hepatobiliary: Multiple gallstones fill the lumen of the gallbladder. Stones range in size from 3 to 10 mm and number approximately 50. No gallbladder wall thickening. No pericholecystic fluid. No gallbladder distension The common bile duct is normal caliber. Measuring 5 mm. There are no filling defects within the common bile duct. There is no intrahepatic biliary duct dilatation. Normal hepatic parenchyma.  No evidence of hepatic steatosis There are 2 lesions in the hepatic parenchyma which are hyperintense on T2 weighted imaging (image 17 and 7 of series 23. These lesions have questionable peripheral enhancement. Pancreas: Normal pancreatic parenchymal intensity. No ductal dilatation or inflammation. Spleen: Normal spleen. Adrenals/urinary tract: Adrenal glands and kidneys are normal. Stomach/Bowel: Stomach and limited of the small bowel is unremarkable Vascular/Lymphatic: Abdominal aortic normal caliber. No retroperitoneal periportal lymphadenopathy. Musculoskeletal: No aggressive osseous lesion IMPRESSION: 1. Cholelithiasis without evidence cholecystitis. 2. No biliary duct dilatation.  No choledocholithiasis. 3. Normal pancreas. 4. Benign hepatic cysts or hemangiomas. Electronically Signed   By: Suzy Bouchard M.D.   On: 12/15/2018 07:49   US Abdomen Limited Ruq  Result Date: 12/14/2018 CLINICAL DATA:  Upper  abdominal pain EXAM: ULTRASOUND ABDOMEN LIMITED RIGHT UPPER QUADRANT COMPARISON:  None. FINDINGS: Gallbladder: Within the gallbladder, there are multiple echogenic foci which move and shadow consistent with cholelithiasis. Largest gallstone measures 9 mm in length. The gallbladder wall is not appreciably thickened, and there is no pericholecystic fluid. No sonographic Murphy sign noted by sonographer. Common bile duct: Diameter: 9 mm which is enlarged. No biliary duct mass or calculus evident. No appreciable intrahepatic biliary duct dilatation. Liver: No focal lesion identified. Within normal limits in parenchymal echogenicity. Portal vein is patent on color Doppler imaging with normal direction of blood flow towards the liver. IMPRESSION: Cholelithiasis. No appreciable gallbladder wall thickening or pericholecystic fluid. Prominence of the common hepatic duct without demonstrable biliary duct mass or calculus. If further evaluation of the common bile duct is felt to be warranted, MRCP nonemergently would be the imaging study of choice for further assessment. Electronically Signed   By: Lowella Grip III M.D.   On: 12/14/2018 21:02    Time Spent in minutes  25   Marnell Mcdaniel M.D on 12/16/2018 at 3:44 PM  Between 7am to 7pm - Pager - [phone]  After 7pm go to [email or web address] - password [facility]  [facility] -  Office  [phone]

## 2018-12-16 NOTE — Progress Notes (Signed)
Subjective/Chief Complaint: Slept well. Having some low back pain, denies abdominal pain   Objective: Vital signs in last 24 hours: Temp:  [98.3 F (36.8 C)-98.7 F (37.1 C)] 98.3 F (36.8 C) (01/08 0500) Pulse Rate:  [62-71] 62 (01/08 0500) Resp:  [18] 18 (01/08 0500) BP: (113-136)/(69-83) 113/70 (01/08 0500) SpO2:  [100 %] 100 % (01/08 0500) Last BM Date: 12/11/18  Intake/Output from previous day: 01/07 0701 - 01/08 0700 In: 1490 [P.O.:240; I.V.:1250] Out: -  Intake/Output this shift: No intake/output data recorded.  General appearance: alert and cooperative Resp: clear to auscultation bilaterally GI: soft, non-tender; bowel sounds normal; no masses,  no organomegaly Skin: Skin color, texture, turgor normal. No rashes or lesions Neurologic: Grossly normal  Lab Results:  Recent Labs    12/14/18 1352 12/16/18 0400  WBC 6.9 5.0  HGB 11.1* 9.0*  HCT 31.2* 25.1*  PLT 113* 73*   BMET Recent Labs    12/15/18 0559 12/16/18 0400  NA 141 140  K 3.6 3.7  CL 111 111  CO2 24 24  GLUCOSE 106* 91  BUN <5* <5*  CREATININE 0.95 0.76  CALCIUM 8.4* 8.4*   PT/INR Recent Labs    12/14/18 2310  LABPROT 14.6  INR 1.15   ABG No results for input(s): PHART, HCO3 in the last 72 hours.  Invalid input(s): PCO2, PO2  Studies/Results: Mr 3d Recon At Scanner  Result Date: 12/15/2018 CLINICAL DATA:  Epigastric and upper abdominal pain. Cholelithiasis on ultrasound. Common bile duct dilatation. EXAM: MRI ABDOMEN WITHOUT AND WITH CONTRAST (INCLUDING MRCP) TECHNIQUE: Multiplanar multisequence MR imaging of the abdomen was performed both before and after the administration of intravenous contrast. Heavily T2-weighted images of the biliary and pancreatic ducts were obtained, and three-dimensional MRCP images were rendered by post processing. CONTRAST:  10 mL Gadavist COMPARISON:  Ultrasound 12/14/2018 FINDINGS: Lower chest:  Lung bases are clear. Hepatobiliary: Multiple  gallstones fill the lumen of the gallbladder. Stones range in size from 3 to 10 mm and number approximately 50. No gallbladder wall thickening. No pericholecystic fluid. No gallbladder distension The common bile duct is normal caliber. Measuring 5 mm. There are no filling defects within the common bile duct. There is no intrahepatic biliary duct dilatation. Normal hepatic parenchyma.  No evidence of hepatic steatosis There are 2 lesions in the hepatic parenchyma which are hyperintense on T2 weighted imaging (image 17 and 7 of series 23. These lesions have questionable peripheral enhancement. Pancreas: Normal pancreatic parenchymal intensity. No ductal dilatation or inflammation. Spleen: Normal spleen. Adrenals/urinary tract: Adrenal glands and kidneys are normal. Stomach/Bowel: Stomach and limited of the small bowel is unremarkable Vascular/Lymphatic: Abdominal aortic normal caliber. No retroperitoneal periportal lymphadenopathy. Musculoskeletal: No aggressive osseous lesion IMPRESSION: 1. Cholelithiasis without evidence cholecystitis. 2. No biliary duct dilatation.  No choledocholithiasis. 3. Normal pancreas. 4. Benign hepatic cysts or hemangiomas. Electronically Signed   By: Suzy Bouchard M.D.   On: 12/15/2018 07:49   Dg Abd Acute W/chest  Result Date: 12/14/2018 CLINICAL DATA:  Abdominal pain, nausea EXAM: DG ABDOMEN ACUTE W/ 1V CHEST COMPARISON:  06/02/2018 FINDINGS: Peribronchial thickening compatible with bronchitis. Heart is normal size. No confluent opacities or effusions. Numerous gallstones noted in the right upper quadrant, likely filling the gallbladder. No evidence of bowel obstruction or free air. No organomegaly. No acute bony abnormality. IMPRESSION: Peribronchial thickening compatible with bronchitis. Gallstones fill the gallbladder. No evidence of bowel obstruction or free air. Electronically Signed   By: Rolm Baptise  M.D.   On: 12/14/2018 12:17   Mr Abdomen Mrcp Moise Boring Contast  Result  Date: 12/15/2018 CLINICAL DATA:  Epigastric and upper abdominal pain. Cholelithiasis on ultrasound. Common bile duct dilatation. EXAM: MRI ABDOMEN WITHOUT AND WITH CONTRAST (INCLUDING MRCP) TECHNIQUE: Multiplanar multisequence MR imaging of the abdomen was performed both before and after the administration of intravenous contrast. Heavily T2-weighted images of the biliary and pancreatic ducts were obtained, and three-dimensional MRCP images were rendered by post processing. CONTRAST:  10 mL Gadavist COMPARISON:  Ultrasound 12/14/2018 FINDINGS: Lower chest:  Lung bases are clear. Hepatobiliary: Multiple gallstones fill the lumen of the gallbladder. Stones range in size from 3 to 10 mm and number approximately 50. No gallbladder wall thickening. No pericholecystic fluid. No gallbladder distension The common bile duct is normal caliber. Measuring 5 mm. There are no filling defects within the common bile duct. There is no intrahepatic biliary duct dilatation. Normal hepatic parenchyma.  No evidence of hepatic steatosis There are 2 lesions in the hepatic parenchyma which are hyperintense on T2 weighted imaging (image 17 and 7 of series 23. These lesions have questionable peripheral enhancement. Pancreas: Normal pancreatic parenchymal intensity. No ductal dilatation or inflammation. Spleen: Normal spleen. Adrenals/urinary tract: Adrenal glands and kidneys are normal. Stomach/Bowel: Stomach and limited of the small bowel is unremarkable Vascular/Lymphatic: Abdominal aortic normal caliber. No retroperitoneal periportal lymphadenopathy. Musculoskeletal: No aggressive osseous lesion IMPRESSION: 1. Cholelithiasis without evidence cholecystitis. 2. No biliary duct dilatation.  No choledocholithiasis. 3. Normal pancreas. 4. Benign hepatic cysts or hemangiomas. Electronically Signed   By: Suzy Bouchard M.D.   On: 12/15/2018 07:49   US Abdomen Limited Ruq  Result Date: 12/14/2018 CLINICAL DATA:  Upper abdominal pain EXAM:  ULTRASOUND ABDOMEN LIMITED RIGHT UPPER QUADRANT COMPARISON:  None. FINDINGS: Gallbladder: Within the gallbladder, there are multiple echogenic foci which move and shadow consistent with cholelithiasis. Largest gallstone measures 9 mm in length. The gallbladder wall is not appreciably thickened, and there is no pericholecystic fluid. No sonographic Murphy sign noted by sonographer. Common bile duct: Diameter: 9 mm which is enlarged. No biliary duct mass or calculus evident. No appreciable intrahepatic biliary duct dilatation. Liver: No focal lesion identified. Within normal limits in parenchymal echogenicity. Portal vein is patent on color Doppler imaging with normal direction of blood flow towards the liver. IMPRESSION: Cholelithiasis. No appreciable gallbladder wall thickening or pericholecystic fluid. Prominence of the common hepatic duct without demonstrable biliary duct mass or calculus. If further evaluation of the common bile duct is felt to be warranted, MRCP nonemergently would be the imaging study of choice for further assessment. Electronically Signed   By: Lowella Grip III M.D.   On: 12/14/2018 21:02    Anti-infectives: Anti-infectives (From admission, onward)   Start     Dose/Rate Route Frequency Ordered Stop   12/16/18 0800  vancomycin (VANCOCIN) 1,500 mg in sodium chloride 0.9 % 500 mL IVPB     1,500 mg 250 mL/hr over 120 Minutes Intravenous  Once 12/15/18 1526     12/15/18 0330  metroNIDAZOLE (FLAGYL) IVPB 500 mg  Status:  Discontinued     500 mg 100 mL/hr over 60 Minutes Intravenous Every 8 hours 12/15/18 0317 12/15/18 1117      Assessment/Plan: s/p Procedure(s): LAPAROSCOPIC CHOLECYSTECTOMY WITH INTRAOPERATIVE CHOLANGIOGRAM (N/A) OR today. We discussed risks of surgery yesterday. Questions again invited and answered to her satisfaction.   LOS: 1 day    Clovis Riley 12/16/2018

## 2018-12-16 NOTE — Op Note (Signed)
Operative Note  Tina Mayo 48 y.o. female [id number]  12/16/2018  Surgeon: Clovis Riley MD  Assistant: none  Procedure performed: Laparoscopic Cholecystectomy  Preop diagnosis: history of choledocholithiasis, negative MRCP Post-op diagnosis/intraop findings: same, chronic cholecystitis  Specimens: gallbladder  EBL: minimal  Complications: none  Description of procedure: After obtaining informed consent the patient was brought to the operating room. Prophylactic antibiotics were administered. SCD's were applied. General endotracheal anesthesia was initiated and a formal time-out was performed. The abdomen was prepped and draped in the usual sterile fashion and the abdomen was entered using an infraumbilical veress needle after instilling the site with local. Insufflation to 54mmHg was obtained and gross inspection revealed no evidence of injury from our entry or other intraabdominal abnormalities. Two 67mm trocars were introduced in the right midclavicular and right anterior axillary lines under direct visualization and following infiltration with local. An 28mm trocar was placed in the epigastrium. Omental adhesions to the gallbladder were taken down with cautery and blunt dissection, taking care to avoid injury to the nearby duodenum. The gallbladder was retracted cephalad and the infundibulum was retracted laterally. A combination of hook electrocautery and blunt dissection was utilized to clear the peritoneum from the neck and cystic duct, circumferentially isolating the cystic artery and cystic duct and lifting the gallbladder from the cystic plate. The critical view of safety was achieved with the cystic artery, cystic duct, and liver bed visualized between them with no other structures. The artery was clipped with a single clip proximally and distally and divided as was the cystic duct with three clips on the proximal end. The gallbladder was dissected from the liver plate using  electrocautery. Once freed the gallbladder was placed in an endocatch bag and removed intact through the epigastric trocar site. A small amount of bleeding on the liver bed was controlled with cautery. The right upper quadrant was irrigated and aspirated; the effluent was clear. Hemostasis was once again confirmed, and reinspection of the abdomen revealed no injuries. The clips were well opposed without any bile leak from the duct or the liver bed. A very small fibrous structure running parallel to the gallbladder on the mid-liver bed was additionally clipped although there was no bile leak to suggest an accessory duct, this was fairly high up on the liver bed and remote from any critical structures.  The 29mm trocar site in the epigastrium was closed with two 0 vicryls in the fascia under direct visualization using a PMI device. The abdomen was desufflated and all trocars removed. The skin incisions were closed with running subcuticular monocryl and Dermabond. The patient was awakened, extubated and transported to the recovery room in stable condition.   All counts were correct at the completion of the case.

## 2018-12-16 NOTE — Transfer of Care (Signed)
Immediate Anesthesia Transfer of Care Note  Patient: Tina Mayo  Procedure(s) Performed: LAPAROSCOPIC CHOLECYSTECTOMY (N/A Abdomen)  Patient Location: PACU  Anesthesia Type:General  Level of Consciousness: awake  Airway & Oxygen Therapy: Patient Spontanous Breathing and Patient connected to face mask oxygen  Post-op Assessment: Report given to RN and Post -op Vital signs reviewed and stable  Post vital signs: Reviewed and stable  Last Vitals:  Vitals Value Taken Time  BP 115/69 12/16/2018 11:32 AM  Temp    Pulse 90 12/16/2018 11:33 AM  Resp 20 12/16/2018 11:33 AM  SpO2 100 % 12/16/2018 11:33 AM  Vitals shown include unvalidated device data.  Last Pain:  Vitals:   12/16/18 0500  TempSrc: Oral  PainSc:          Complications: No apparent anesthesia complications

## 2018-12-16 NOTE — Anesthesia Postprocedure Evaluation (Signed)
Anesthesia Post Note  Patient: Tina Mayo  Procedure(s) Performed: LAPAROSCOPIC CHOLECYSTECTOMY (N/A Abdomen)     Patient location during evaluation: PACU Anesthesia Type: General Level of consciousness: awake and alert, patient cooperative and oriented Pain management: pain level controlled Vital Signs Assessment: post-procedure vital signs reviewed and stable Respiratory status: spontaneous breathing, nonlabored ventilation and respiratory function stable Cardiovascular status: blood pressure returned to baseline and stable Postop Assessment: no apparent nausea or vomiting Anesthetic complications: no    Last Vitals:  Vitals:   12/16/18 1132 12/16/18 1145  BP: 115/69 116/68  Pulse: 87 78  Resp: 15 (!) 22  Temp: 36.7 C   SpO2: 100% 99%    Last Pain:  Vitals:   12/16/18 1132  TempSrc:   PainSc: 0-No pain                 Taylia Berber,E. Dontavian Marchi

## 2018-12-16 NOTE — Anesthesia Preprocedure Evaluation (Addendum)
Anesthesia Evaluation  Patient identified by MRN, date of birth, ID band Patient awake    Reviewed: Allergy & Precautions, NPO status , Patient's Chart, lab work & pertinent test results  History of Anesthesia Complications Negative for: history of anesthetic complications  Airway Mallampati: I  TM Distance: >3 FB Neck ROM: Full    Dental  (+) Edentulous Upper, Edentulous Lower   Pulmonary asthma , Current Smoker,    breath sounds clear to auscultation       Cardiovascular (-) hypertension Rhythm:Regular Rate:Normal  '14 ECHO: EF 60-65%, valves OK   Neuro/Psych Anxiety Depression Bell's palsy    GI/Hepatic GERD  ,Elevated LFTs N/v with present cholelithiasis   Endo/Other  Morbid obesity  Renal/GU negative Renal ROS     Musculoskeletal negative musculoskeletal ROS (+)   Abdominal (+) + obese,   Peds  Hematology  (+) Blood dyscrasia (Hb 9.0, plt 73k), Sickle cell trait and anemia ,   Anesthesia Other Findings   Reproductive/Obstetrics Post menopausal                            Anesthesia Physical Anesthesia Plan  ASA: III  Anesthesia Plan: General   Post-op Pain Management:    Induction: Intravenous, Rapid sequence and Cricoid pressure planned  PONV Risk Score and Plan: 3 and Ondansetron, Dexamethasone and Scopolamine patch - Pre-op  Airway Management Planned: Oral ETT  Additional Equipment:   Intra-op Plan:   Post-operative Plan: Extubation in OR  Informed Consent: I have reviewed the patients History and Physical, chart, labs and discussed the procedure including the risks, benefits and alternatives for the proposed anesthesia with the patient or authorized representative who has indicated his/her understanding and acceptance.     Plan Discussed with: CRNA and Surgeon  Anesthesia Plan Comments: (Routine monitors, GETA)       Anesthesia Quick Evaluation

## 2018-12-16 NOTE — Discharge Instructions (Signed)
[facility] ° °Please arrive at least 30 min before your appointment to complete your check in paperwork.  If you are unable to arrive 30 min prior to your appointment time we may have to cancel or reschedule you. °LAPAROSCOPIC SURGERY: POST OP INSTRUCTIONS °Always review your discharge instruction sheet given to you by the facility where your surgery was performed. °IF YOU HAVE DISABILITY OR FAMILY LEAVE FORMS, YOU MUST BRING THEM TO THE OFFICE FOR PROCESSING.   °DO NOT GIVE THEM TO YOUR DOCTOR. ° °PAIN CONTROL ° °1. First take acetaminophen (Tylenol) AND/or ibuprofen (Advil) to control your pain after surgery.  Follow directions on package.  Taking acetaminophen (Tylenol) and/or ibuprofen (Advil) regularly after surgery will help to control your pain and lower the amount of prescription pain medication you may need.  You should not take more than 4,000 mg (4 grams) of acetaminophen (Tylenol) in 24 hours.  You should not take ibuprofen (Advil), aleve, motrin, naprosyn or other NSAIDS if you have a history of stomach ulcers or chronic kidney disease.  °2. A prescription for pain medication may be given to you upon discharge.  Take your pain medication as prescribed, if you still have uncontrolled pain after taking acetaminophen (Tylenol) or ibuprofen (Advil). °3. Use ice packs to help control pain. °4. If you need a refill on your pain medication, please contact your pharmacy.  They will contact our office to request authorization. Prescriptions will not be filled after 5pm or on week-ends. ° °HOME MEDICATIONS °5. Take your usually prescribed medications unless otherwise directed. ° °DIET °6. You should follow a light diet the first few days after arrival home.  Be sure to include lots of fluids daily. Avoid fatty, fried foods.  ° °CONSTIPATION °7. It is common to experience some constipation after surgery and if you are taking pain medication.  Increasing fluid intake and taking a stool  softener (such as Colace) will usually help or prevent this problem from occurring.  A mild laxative (Milk of Magnesia or Miralax) should be taken according to package instructions if there are no bowel movements after 48 hours. ° °WOUND/INCISION CARE °8. Most patients will experience some swelling and bruising in the area of the incisions.  Ice packs will help.  Swelling and bruising can take several days to resolve.  °9. Unless discharge instructions indicate otherwise, follow guidelines below  °a. STERI-STRIPS - you may remove your outer bandages 48 hours after surgery, and you may shower at that time.  You have steri-strips (small skin tapes) in place directly over the incision.  These strips should be left on the skin for 7-10 days.   °b. DERMABOND/SKIN GLUE - you may shower in 24 hours.  The glue will flake off over the next 2-3 weeks. °10. Any sutures or staples will be removed at the office during your follow-up visit. ° °ACTIVITIES °11. You may resume regular (light) daily activities beginning the next day--such as daily self-care, walking, climbing stairs--gradually increasing activities as tolerated.  You may have sexual intercourse when it is comfortable.  Refrain from any heavy lifting or straining until approved by your doctor. °a. You may drive when you are no longer taking prescription pain medication, you can comfortably wear a seatbelt, and you can safely maneuver your car and apply brakes. ° °FOLLOW-UP °12. You should see your doctor in the office for a follow-up appointment approximately 2-3 weeks after your surgery.  You should have been given your post-op/follow-up appointment when   your surgery was scheduled.  If you did not receive a post-op/follow-up appointment, make sure that you call for this appointment within a day or two after you arrive home to insure a convenient appointment time. ° °OTHER INSTRUCTIONS ° °WHEN TO CALL YOUR DOCTOR: °1. Fever over 101.0 °2. Inability to  urinate °3. Continued bleeding from incision. °4. Increased pain, redness, or drainage from the incision. °5. Increasing abdominal pain ° °The clinic staff is available to answer your questions during regular business hours.  Please don’t hesitate to call and ask to speak to one of the nurses for clinical concerns.  If you have a medical emergency, go to the nearest emergency room or call 911.  A surgeon from [facility] is always on call at the hospital. °[address], [address], [place], [place]  [address] ? [address] [address], [place], [place]   [address] °[phone] ? [phone] ? FAX [phone] ° ° ° °

## 2018-12-16 NOTE — Progress Notes (Signed)
Pharmacy consulted earlier for pre-op dose of Vancomycin for LAPAROSCOPIC CHOLECYSTECTOMY WITH INTRAOPERATIVE CHOLANGIOGRAM. Pre-op dose of Vancomycin 1500mg  IV given at 1638 this AM. Duplicate pre-op order for vancomycin 1000mg  IV scheduled for 0900 d/c'd per protocol.   [place] Route A. Levada Dy, PharmD, Trail Pager: [phone] Please utilize Amion for appropriate phone number to reach the unit pharmacist ([place])

## 2018-12-16 NOTE — Anesthesia Procedure Notes (Signed)
Procedure Name: Intubation Date/Time: 12/16/2018 10:26 AM Performed by: Lieutenant Diego, CRNA Pre-anesthesia Checklist: Patient identified, Emergency Drugs available, Suction available and Patient being monitored Patient Re-evaluated:Patient Re-evaluated prior to induction Oxygen Delivery Method: Circle system utilized Preoxygenation: Pre-oxygenation with 100% oxygen Induction Type: IV induction Ventilation: Mask ventilation without difficulty Laryngoscope Size: Miller and 3 Grade View: Grade I Tube type: Oral Tube size: 7.0 mm Number of attempts: 1 Airway Equipment and Method: Stylet and Oral airway Placement Confirmation: ETT inserted through vocal cords under direct vision,  positive ETCO2 and breath sounds checked- equal and bilateral Secured at: 22 cm Tube secured with: Tape Dental Injury: Teeth and Oropharynx as per pre-operative assessment

## 2018-12-17 ENCOUNTER — Encounter (HOSPITAL_COMMUNITY): Payer: Self-pay | Admitting: Surgery

## 2018-12-17 DIAGNOSIS — R740 Nonspecific elevation of levels of transaminase and lactic acid dehydrogenase [LDH]: Secondary | ICD-10-CM

## 2018-12-17 LAB — CBC
HCT: 22.6 % — ABNORMAL LOW (ref 36.0–46.0)
HEMOGLOBIN: 8.2 g/dL — AB (ref 12.0–15.0)
MCH: 30 pg (ref 26.0–34.0)
MCHC: 36.3 g/dL — ABNORMAL HIGH (ref 30.0–36.0)
MCV: 82.8 fL (ref 80.0–100.0)
Platelets: 56 10*3/uL — ABNORMAL LOW (ref 150–400)
RBC: 2.73 MIL/uL — ABNORMAL LOW (ref 3.87–5.11)
RDW: 14.5 % (ref 11.5–15.5)
WBC: 12.8 10*3/uL — ABNORMAL HIGH (ref 4.0–10.5)
nRBC: 0.2 % (ref 0.0–0.2)

## 2018-12-17 LAB — COMPREHENSIVE METABOLIC PANEL
ALT: 105 U/L — ABNORMAL HIGH (ref 0–44)
AST: 56 U/L — AB (ref 15–41)
Albumin: 3.2 g/dL — ABNORMAL LOW (ref 3.5–5.0)
Alkaline Phosphatase: 98 U/L (ref 38–126)
Anion gap: 8 (ref 5–15)
BUN: <5 mg/dL — ABNORMAL LOW (ref 6–20)
CHLORIDE: 108 mmol/L (ref 98–111)
CO2: 23 mmol/L (ref 22–32)
Calcium: 8.4 mg/dL — ABNORMAL LOW (ref 8.9–10.3)
Creatinine, Ser: 0.83 mg/dL (ref 0.44–1.00)
GFR calc Af Amer: >60 mL/min (ref >60)
GFR calc non Af Amer: >60 mL/min (ref >60)
Glucose, Bld: 92 mg/dL (ref 70–99)
Potassium: 3.6 mmol/L (ref 3.5–5.1)
Sodium: 139 mmol/L (ref 135–145)
Total Bilirubin: 3.2 mg/dL — ABNORMAL HIGH (ref 0.3–1.2)
Total Protein: 6.3 g/dL — ABNORMAL LOW (ref 6.5–8.1)

## 2018-12-17 MED ORDER — SENNOSIDES-DOCUSATE SODIUM 8.6-50 MG PO TABS
2.0000 | ORAL_TABLET | Freq: Two times a day (BID) | ORAL | Status: DC
  Administered 2018-12-17 – 2018-12-18 (×2): 2 via ORAL
  Filled 2018-12-17 (×2): qty 2

## 2018-12-17 MED ORDER — POLYETHYLENE GLYCOL 3350 17 G PO PACK: 17.0000 g | PACK | Freq: Every day | ORAL | 0 refills | Status: AC

## 2018-12-17 MED ORDER — TRAMADOL HCL 50 MG PO TABS: 50.0000 mg | ORAL_TABLET | Freq: Four times a day (QID) | ORAL | Status: DC | PRN

## 2018-12-17 MED ORDER — ACETAMINOPHEN 500 MG PO TABS: 1000.0000 mg | ORAL_TABLET | Freq: Three times a day (TID) | ORAL | Status: DC

## 2018-12-17 MED ORDER — BISACODYL 10 MG RE SUPP
10.0000 mg | Freq: Once | RECTAL | Status: AC
  Administered 2018-12-17: 10 mg via RECTAL
  Filled 2018-12-17: qty 1

## 2018-12-17 MED ORDER — SENNOSIDES-DOCUSATE SODIUM 8.6-50 MG PO TABS: 1.0000 | ORAL_TABLET | Freq: Two times a day (BID) | ORAL | 0 refills | Status: DC | PRN

## 2018-12-17 MED ORDER — HYDROCODONE-ACETAMINOPHEN 5-325 MG PO TABS: 2.0000 | ORAL_TABLET | Freq: Four times a day (QID) | ORAL | 0 refills | Status: DC | PRN

## 2018-12-17 MED ORDER — POLYETHYLENE GLYCOL 3350 17 G PO PACK
17.0000 g | PACK | Freq: Two times a day (BID) | ORAL | Status: DC
  Filled 2018-12-17 (×2): qty 1

## 2018-12-17 MED ORDER — HYDROCODONE-ACETAMINOPHEN 5-325 MG PO TABS
2.0000 | ORAL_TABLET | ORAL | Status: DC | PRN
  Administered 2018-12-17 – 2018-12-18 (×4): 2 via ORAL
  Filled 2018-12-17 (×4): qty 2

## 2018-12-17 MED ORDER — MORPHINE SULFATE (PF) 2 MG/ML IV SOLN: 2.0000 mg | INTRAVENOUS | Status: DC | PRN

## 2018-12-17 MED ORDER — PSYLLIUM 95 % PO PACK
1.0000 | PACK | Freq: Every day | ORAL | Status: DC
  Filled 2018-12-17: qty 1

## 2018-12-17 NOTE — Plan of Care (Signed)
  Problem: Clinical Measurements: Goal: Postoperative complications will be avoided or minimized Outcome: Progressing   

## 2018-12-17 NOTE — Progress Notes (Signed)
PROGRESS NOTE                                                                                                                                                                                                             Patient Demographics:    Tina Mayo, is a 48 y.o. female, DOB - 04-21-1971, DPO:[id number]  Admit date - 12/14/2018   Admitting Physician Ivor Costa, MD  Outpatient Primary MD for the patient is Pa, [place]  LOS - 2  Outpatient Specialists:   Chief Complaint  Patient presents with  . Abdominal Pain       Brief Narrative   48 year old female with history of asthma, sickle cell trait, tobacco abuse, depression, chronic thrombocytopenia presented with abdominal pain, nausea and vomiting with elevated LFTs.  MRCP showed stones in CBD.  General surgery consulted and patient underwent laparoscopic cholecystectomy on 1/8.   Subjective:   Complaining of a lot of pain in her abdomen.Marland Kitchen  Has been passing gas but reports being constipated for past 4 days.  Tolerating diet.   Assessment  & Plan :    Principal Problem:   Choledocholithiasis MRCP negative for CBD stone and findings of chronic cholecystitis.  Underwent laparoscopic cholecystectomy.  Tolerated well.  Tolerating advance diet but still having a lot of abdominal pain.  LFTs trending down very slowly. Discontinued fluids. Encouraged to ambulate.  Continue p.o. pain medications.  Surgery recommends to monitor another day to make sure her total bilirubin is trending down as IOC could not be done.  Active Problems:   Thrombocytopenia, unspecified ([place]) Platelets normal normal.  Follow as outpatient.    Depression Continue home dose Abilify    Tobacco abuse Continue nicotine patch.  Chronic constipation Continue twice daily MiraLAX.  Added Dulcolax suppository.  If no improvement will order enema.    Code Status : Full code  Family  Communication  : None at bedside  Disposition Plan  : Home in a.m if abdominal pain improved and bilirubin trending down.  Barriers For Discharge : Active symptoms, postop  Consults  : GI, [facility]  Procedures  : MRCP, laparoscopic cholecystectomy  DVT Prophylaxis  : SCDs  Lab Results  Component Value Date   PLT 56 (L) 12/17/2018    Antibiotics  :    Anti-infectives (From admission, onward)   Start  Dose/Rate Route Frequency Ordered Stop   12/16/18 0900  vancomycin (VANCOCIN) IVPB 1000 mg/200 mL premix  Status:  Discontinued     1,000 mg 200 mL/hr over 60 Minutes Intravenous On call to O.R. 12/16/18 0846 12/16/18 0855   12/16/18 0800  vancomycin (VANCOCIN) 1,500 mg in sodium chloride 0.9 % 500 mL IVPB     1,500 mg 250 mL/hr over 120 Minutes Intravenous  Once 12/15/18 1526 12/16/18 1045   12/15/18 0330  metroNIDAZOLE (FLAGYL) IVPB 500 mg  Status:  Discontinued     500 mg 100 mL/hr over 60 Minutes Intravenous Every 8 hours 12/15/18 0317 12/15/18 1117        Objective:   Vitals:   12/16/18 2057 12/17/18 0116 12/17/18 0525 12/17/18 1331  BP: 117/71 110/68 114/71 123/79  Pulse: 70 80 76 92  Resp: 18 18 16    Temp: 98.7 F (37.1 C) 99.8 F (37.7 C) 99.4 F (37.4 C) 98.1 F (36.7 C)  TempSrc: Oral Oral Oral Oral  SpO2: 98% 98% 97% 97%  Weight:      Height:        Wt Readings from Last 3 Encounters:  12/16/18 90.7 kg  12/16/17 93.4 kg  02/05/17 91.6 kg     Intake/Output Summary (Last 24 hours) at 12/17/2018 1416 Last data filed at 12/17/2018 0900 Gross per 24 hour  Intake 720 ml  Output -  Net 720 ml    Physical exam Fatigued, in distress with pain HEENT: Pallor present, icterus, moist mucosa Chest: Clear bilaterally CVs: Normal S1 and S2 GI: Soft, nondistended, mild diffuse tenderness, laparoscopic site appears clean, bowel sounds present Musculoskeletal: Warm, no edema      Data Review:    CBC Recent Labs  Lab 12/14/18 1352  12/16/18 0400 12/17/18 0242  WBC 6.9 5.0 12.8*  HGB 11.1* 9.0* 8.2*  HCT 31.2* 25.1* 22.6*  PLT 113* 73* 56*  MCV 81.0 80.4 82.8  MCH 28.8 28.8 30.0  MCHC 35.6 35.9 36.3*  RDW 14.6 14.3 14.5    Chemistries  Recent Labs  Lab 12/14/18 1352 12/15/18 0559 12/16/18 0400 12/17/18 0242  NA 140 141 140 139  K 4.2 3.6 3.7 3.6  CL 105 111 111 108  CO2 26 24 24 23   GLUCOSE 87 106* 91 92  BUN 5* <5* <5* <5*  CREATININE 0.83 0.95 0.76 0.83  CALCIUM 9.1 8.4* 8.4* 8.4*  AST 150* 97* 60* 56*  ALT 200* 152* 119* 105*  ALKPHOS 131* 115 100 98  BILITOT 8.5* 3.8* 2.4* 3.2*   ------------------------------------------------------------------------------------------------------------------ No results for input(s): CHOL, HDL, LDLCALC, TRIG, CHOLHDL, LDLDIRECT in the last 72 hours.  No results found for: HGBA1C ------------------------------------------------------------------------------------------------------------------ No results for input(s): TSH, T4TOTAL, T3FREE, THYROIDAB in the last 72 hours.  Invalid input(s): FREET3 ------------------------------------------------------------------------------------------------------------------ No results for input(s): VITAMINB12, FOLATE, FERRITIN, TIBC, IRON, RETICCTPCT in the last 72 hours.  Coagulation profile Recent Labs  Lab 12/14/18 2310  INR 1.15    No results for input(s): DDIMER in the last 72 hours.  Cardiac Enzymes No results for input(s): CKMB, TROPONINI, MYOGLOBIN in the last 168 hours.  Invalid input(s): CK ------------------------------------------------------------------------------------------------------------------ No results found for: BNP  Inpatient Medications  Scheduled Meds: . acetaminophen  1,000 mg Oral Q8H  . ARIPiprazole  15 mg Oral QHS  . docusate sodium  100 mg Oral BID  . nicotine  21 mg Transdermal Daily  . psyllium  1 packet Oral QHS  . scopolamine  1 patch Transdermal Q72H  Continuous  Infusions: . sodium chloride Stopped (12/17/18 0811)  . lactated ringers     PRN Meds:.albuterol, ibuprofen, morphine injection, ondansetron (ZOFRAN) IV, polyethylene glycol, traMADol, zolpidem  Micro Results Recent Results (from the past 240 hour(s))  Culture, blood (Routine X 2) w Reflex to ID Panel     Status: None (Preliminary result)   Collection Time: 12/15/18  5:46 AM  Result Value Ref Range Status   Specimen Description BLOOD LEFT ARM  Final   Special Requests   Final    BOTTLES DRAWN AEROBIC AND ANAEROBIC Blood Culture adequate volume   Culture   Final    NO GROWTH 2 DAYS Performed at [facility], [place] [address], [place], [place] [address]    Report Status PENDING  Incomplete  Culture, blood (Routine X 2) w Reflex to ID Panel     Status: None (Preliminary result)   Collection Time: 12/15/18  5:53 AM  Result Value Ref Range Status   Specimen Description BLOOD LEFT ARM  Final   Special Requests   Final    BOTTLES DRAWN AEROBIC AND ANAEROBIC Blood Culture results may not be optimal due to an excessive volume of blood received in culture bottles   Culture   Final    NO GROWTH 2 DAYS Performed at [facility], [place] [address], [place], [place] [address]    Report Status PENDING  Incomplete  Surgical pcr screen     Status: None   Collection Time: 12/16/18  8:45 AM  Result Value Ref Range Status   MRSA, PCR NEGATIVE NEGATIVE Final   Staphylococcus aureus NEGATIVE NEGATIVE Final    Comment: (NOTE) The Xpert SA Assay (FDA approved for NASAL specimens in patients 83 years of age and older), is one component of a comprehensive surveillance program. It is not intended to diagnose infection nor to guide or monitor treatment. Performed at [facility], [place] [address], [place], [place] [address]     Radiology Reports Mr 3d Recon At Scanner  Result Date: 12/15/2018 CLINICAL DATA:  Epigastric and upper abdominal pain. Cholelithiasis on ultrasound. Common bile  duct dilatation. EXAM: MRI ABDOMEN WITHOUT AND WITH CONTRAST (INCLUDING MRCP) TECHNIQUE: Multiplanar multisequence MR imaging of the abdomen was performed both before and after the administration of intravenous contrast. Heavily T2-weighted images of the biliary and pancreatic ducts were obtained, and three-dimensional MRCP images were rendered by post processing. CONTRAST:  10 mL Gadavist COMPARISON:  Ultrasound 12/14/2018 FINDINGS: Lower chest:  Lung bases are clear. Hepatobiliary: Multiple gallstones fill the lumen of the gallbladder. Stones range in size from 3 to 10 mm and number approximately 50. No gallbladder wall thickening. No pericholecystic fluid. No gallbladder distension The common bile duct is normal caliber. Measuring 5 mm. There are no filling defects within the common bile duct. There is no intrahepatic biliary duct dilatation. Normal hepatic parenchyma.  No evidence of hepatic steatosis There are 2 lesions in the hepatic parenchyma which are hyperintense on T2 weighted imaging (image 17 and 7 of series 23. These lesions have questionable peripheral enhancement. Pancreas: Normal pancreatic parenchymal intensity. No ductal dilatation or inflammation. Spleen: Normal spleen. Adrenals/urinary tract: Adrenal glands and kidneys are normal. Stomach/Bowel: Stomach and limited of the small bowel is unremarkable Vascular/Lymphatic: Abdominal aortic normal caliber. No retroperitoneal periportal lymphadenopathy. Musculoskeletal: No aggressive osseous lesion IMPRESSION: 1. Cholelithiasis without evidence cholecystitis. 2. No biliary duct dilatation.  No choledocholithiasis. 3. Normal pancreas. 4. Benign hepatic cysts or hemangiomas. Electronically Signed  By: Suzy Bouchard M.D.   On: 12/15/2018 07:49   Dg Abd Acute W/chest  Result Date: 12/14/2018 CLINICAL DATA:  Abdominal pain, nausea EXAM: DG ABDOMEN ACUTE W/ 1V CHEST COMPARISON:  06/02/2018 FINDINGS: Peribronchial thickening compatible with  bronchitis. Heart is normal size. No confluent opacities or effusions. Numerous gallstones noted in the right upper quadrant, likely filling the gallbladder. No evidence of bowel obstruction or free air. No organomegaly. No acute bony abnormality. IMPRESSION: Peribronchial thickening compatible with bronchitis. Gallstones fill the gallbladder. No evidence of bowel obstruction or free air. Electronically Signed   By: Rolm Baptise M.D.   On: 12/14/2018 12:17   Mr Abdomen Mrcp Moise Boring Contast  Result Date: 12/15/2018 CLINICAL DATA:  Epigastric and upper abdominal pain. Cholelithiasis on ultrasound. Common bile duct dilatation. EXAM: MRI ABDOMEN WITHOUT AND WITH CONTRAST (INCLUDING MRCP) TECHNIQUE: Multiplanar multisequence MR imaging of the abdomen was performed both before and after the administration of intravenous contrast. Heavily T2-weighted images of the biliary and pancreatic ducts were obtained, and three-dimensional MRCP images were rendered by post processing. CONTRAST:  10 mL Gadavist COMPARISON:  Ultrasound 12/14/2018 FINDINGS: Lower chest:  Lung bases are clear. Hepatobiliary: Multiple gallstones fill the lumen of the gallbladder. Stones range in size from 3 to 10 mm and number approximately 50. No gallbladder wall thickening. No pericholecystic fluid. No gallbladder distension The common bile duct is normal caliber. Measuring 5 mm. There are no filling defects within the common bile duct. There is no intrahepatic biliary duct dilatation. Normal hepatic parenchyma.  No evidence of hepatic steatosis There are 2 lesions in the hepatic parenchyma which are hyperintense on T2 weighted imaging (image 17 and 7 of series 23. These lesions have questionable peripheral enhancement. Pancreas: Normal pancreatic parenchymal intensity. No ductal dilatation or inflammation. Spleen: Normal spleen. Adrenals/urinary tract: Adrenal glands and kidneys are normal. Stomach/Bowel: Stomach and limited of the small bowel is  unremarkable Vascular/Lymphatic: Abdominal aortic normal caliber. No retroperitoneal periportal lymphadenopathy. Musculoskeletal: No aggressive osseous lesion IMPRESSION: 1. Cholelithiasis without evidence cholecystitis. 2. No biliary duct dilatation.  No choledocholithiasis. 3. Normal pancreas. 4. Benign hepatic cysts or hemangiomas. Electronically Signed   By: Suzy Bouchard M.D.   On: 12/15/2018 07:49   US Abdomen Limited Ruq  Result Date: 12/14/2018 CLINICAL DATA:  Upper abdominal pain EXAM: ULTRASOUND ABDOMEN LIMITED RIGHT UPPER QUADRANT COMPARISON:  None. FINDINGS: Gallbladder: Within the gallbladder, there are multiple echogenic foci which move and shadow consistent with cholelithiasis. Largest gallstone measures 9 mm in length. The gallbladder wall is not appreciably thickened, and there is no pericholecystic fluid. No sonographic Murphy sign noted by sonographer. Common bile duct: Diameter: 9 mm which is enlarged. No biliary duct mass or calculus evident. No appreciable intrahepatic biliary duct dilatation. Liver: No focal lesion identified. Within normal limits in parenchymal echogenicity. Portal vein is patent on color Doppler imaging with normal direction of blood flow towards the liver. IMPRESSION: Cholelithiasis. No appreciable gallbladder wall thickening or pericholecystic fluid. Prominence of the common hepatic duct without demonstrable biliary duct mass or calculus. If further evaluation of the common bile duct is felt to be warranted, MRCP nonemergently would be the imaging study of choice for further assessment. Electronically Signed   By: Lowella Grip III M.D.   On: 12/14/2018 21:02    Time Spent in minutes  25   Breiana Stratmann M.D on 12/17/2018 at 2:16 PM  Between 7am to 7pm - Pager - [phone]  After 7pm go to [email or web address] - password  [facility] -  Office  [phone]

## 2018-12-17 NOTE — Progress Notes (Signed)
Pt given suppository today , no bm yet. Pt reports passing large amounts of gas. Does not want enema at this time. Requesting to stay tonight. Notified md. Awaiting orders. Patient walking hall

## 2018-12-17 NOTE — Plan of Care (Signed)
  Problem: Education: Goal: Knowledge of General Education information will improve Description Including pain rating scale, medication(s)/side effects and non-pharmacologic comfort measures Outcome: Progressing   Problem: Health Behavior/Discharge Planning: Goal: Ability to manage health-related needs will improve Outcome: Progressing   Problem: Clinical Measurements: Goal: Ability to maintain clinical measurements within normal limits will improve Outcome: Progressing Goal: Will remain free from infection Outcome: Progressing Goal: Diagnostic test results will improve Outcome: Progressing Goal: Respiratory complications will improve Outcome: Progressing Goal: Cardiovascular complication will be avoided Outcome: Progressing   Problem: Safety: Goal: Ability to remain free from injury will improve Outcome: Progressing   Problem: Skin Integrity: Goal: Risk for impaired skin integrity will decrease Outcome: Progressing   Problem: Education: Goal: Required Educational Video(s) Outcome: Progressing   Problem: Clinical Measurements: Goal: Postoperative complications will be avoided or minimized Outcome: Progressing   Problem: Skin Integrity: Goal: Demonstration of wound healing without infection will improve Outcome: Progressing

## 2018-12-17 NOTE — Progress Notes (Signed)
1 Day Post-Op    CC: Abdominal pain  Subjective: She is having a lot of discomfort but her sites look good and she is tolerating diet.  She is on chronic Tylenol for her shoulder and does not think it works very well.  I have increased her dose, she also has ibuprofen and tramadol for pain control.  She is still constipated.  Objective: Vital signs in last 24 hours: Temp:  [97.2 F (36.2 C)-99.8 F (37.7 C)] 99.4 F (37.4 C) (01/09 0525) Pulse Rate:  [66-87] 76 (01/09 0525) Resp:  [15-23] 16 (01/09 0525) BP: (110-126)/(68-83) 114/71 (01/09 0525) SpO2:  [95 %-100 %] 97 % (01/09 0525) Last BM Date: 12/14/18 480 p.o. 700 IV 400 urine recorded No BM T-max 99.8.  Vital signs are stable LFTs are improving, bilirubin still 3.2 WBC 12.8 H&H is 8.2 and 22.6.  Intake/Output from previous day: 01/08 0701 - 01/09 0700 In: 1180 [P.O.:480; I.V.:700] Out: 410 [Urine:400; Blood:10] Intake/Output this shift: Total I/O In: 240 [P.O.:240] Out: -   General appearance: alert, cooperative and no distress Resp: clear to auscultation bilaterally GI: Soft, pretty sore, sites all look fine.  Lab Results:  Recent Labs    12/16/18 0400 12/17/18 0242  WBC 5.0 12.8*  HGB 9.0* 8.2*  HCT 25.1* 22.6*  PLT 73* 56*    BMET Recent Labs    12/16/18 0400 12/17/18 0242  NA 140 139  K 3.7 3.6  CL 111 108  CO2 24 23  GLUCOSE 91 92  BUN <5* <5*  CREATININE 0.76 0.83  CALCIUM 8.4* 8.4*   PT/INR Recent Labs    12/14/18 2310  LABPROT 14.6  INR 1.15    Recent Labs  Lab 12/14/18 1352 12/15/18 0559 12/16/18 0400 12/17/18 0242  AST 150* 97* 60* 56*  ALT 200* 152* 119* 105*  ALKPHOS 131* 115 100 98  BILITOT 8.5* 3.8* 2.4* 3.2*  PROT 7.6 6.5 5.9* 6.3*  ALBUMIN 4.0 3.3* 3.0* 3.2*     Lipase     Component Value Date/Time   LIPASE 24 12/14/2018 1352     Medications: . acetaminophen  650 mg Oral Q6H  . ARIPiprazole  15 mg Oral QHS  . bisacodyl  10 mg Rectal Once  . docusate  sodium  100 mg Oral BID  . nicotine  21 mg Transdermal Daily  . scopolamine  1 patch Transdermal Q72H   . sodium chloride Stopped (12/17/18 0811)  . lactated ringers     Anti-infectives (From admission, onward)   Start     Dose/Rate Route Frequency Ordered Stop   12/16/18 0900  vancomycin (VANCOCIN) IVPB 1000 mg/200 mL premix  Status:  Discontinued     1,000 mg 200 mL/hr over 60 Minutes Intravenous On call to O.R. 12/16/18 0846 12/16/18 0855   12/16/18 0800  vancomycin (VANCOCIN) 1,500 mg in sodium chloride 0.9 % 500 mL IVPB     1,500 mg 250 mL/hr over 120 Minutes Intravenous  Once 12/15/18 1526 12/16/18 1045   12/15/18 0330  metroNIDAZOLE (FLAGYL) IVPB 500 mg  Status:  Discontinued     500 mg 100 mL/hr over 60 Minutes Intravenous Every 8 hours 12/15/18 0317 12/15/18 1117     Assessment/Plan History of anxiety and depression Hx Bell's palsy Hx sickle cell trait Hx constipation   Hx choledocholithiasis with negative MRCP Laparoscopic cholecystectomy 12/16/2018, Tina Mayo  FEN: IV fluids/regular diet ID: Vancomycin preop DVT: SCDs. Follow-up: [facility], 12/29/2018 at 11:45 AM  Plan: Mobilize and advance  her diet.  P.o. pain medications as much as possible.  Recheck her labs in the a.m. to make sure her bilirubin and white count are improving.  We were unable to do a intraoperative cholangiogram during her procedure.  She is getting treated for her constipation.    LOS: 2 days    Tina Mayo 12/17/2018 [phone]

## 2018-12-18 ENCOUNTER — Ambulatory Visit: Payer: Self-pay

## 2018-12-18 DIAGNOSIS — K5901 Slow transit constipation: Secondary | ICD-10-CM | POA: Diagnosis present

## 2018-12-18 DIAGNOSIS — K5900 Constipation, unspecified: Secondary | ICD-10-CM

## 2018-12-18 LAB — COMPREHENSIVE METABOLIC PANEL
ALT: 78 U/L — ABNORMAL HIGH (ref 0–44)
AST: 45 U/L — ABNORMAL HIGH (ref 15–41)
Albumin: 3.1 g/dL — ABNORMAL LOW (ref 3.5–5.0)
Alkaline Phosphatase: 93 U/L (ref 38–126)
Anion gap: 7 (ref 5–15)
BUN: <5 mg/dL — ABNORMAL LOW (ref 6–20)
CO2: 25 mmol/L (ref 22–32)
CREATININE: 0.85 mg/dL (ref 0.44–1.00)
Calcium: 8.4 mg/dL — ABNORMAL LOW (ref 8.9–10.3)
Chloride: 106 mmol/L (ref 98–111)
GFR calc Af Amer: >60 mL/min (ref >60)
GFR calc non Af Amer: >60 mL/min (ref >60)
Glucose, Bld: 103 mg/dL — ABNORMAL HIGH (ref 70–99)
Potassium: 3.2 mmol/L — ABNORMAL LOW (ref 3.5–5.1)
SODIUM: 138 mmol/L (ref 135–145)
Total Bilirubin: 2.8 mg/dL — ABNORMAL HIGH (ref 0.3–1.2)
Total Protein: 5.9 g/dL — ABNORMAL LOW (ref 6.5–8.1)

## 2018-12-18 LAB — CBC
HCT: 22.1 % — ABNORMAL LOW (ref 36.0–46.0)
Hemoglobin: 7.7 g/dL — ABNORMAL LOW (ref 12.0–15.0)
MCH: 28.8 pg (ref 26.0–34.0)
MCHC: 34.8 g/dL (ref 30.0–36.0)
MCV: 82.8 fL (ref 80.0–100.0)
Platelets: 63 10*3/uL — ABNORMAL LOW (ref 150–400)
RBC: 2.67 MIL/uL — ABNORMAL LOW (ref 3.87–5.11)
RDW: 14.9 % (ref 11.5–15.5)
WBC: 8.8 10*3/uL (ref 4.0–10.5)
nRBC: 0.3 % — ABNORMAL HIGH (ref 0.0–0.2)

## 2018-12-18 LAB — IRON AND TIBC
Iron: 78 ug/dL (ref 28–170)
SATURATION RATIOS: 26 % (ref 10.4–31.8)
TIBC: 297 ug/dL (ref 250–450)
UIBC: 219 ug/dL

## 2018-12-18 MED ORDER — POTASSIUM CHLORIDE CRYS ER 20 MEQ PO TBCR
40.0000 meq | EXTENDED_RELEASE_TABLET | Freq: Once | ORAL | Status: AC
  Administered 2018-12-18: 40 meq via ORAL
  Filled 2018-12-18: qty 2

## 2018-12-18 NOTE — Discharge Summary (Signed)
Physician Discharge Summary  Tina Mayo FFM:[id number] DOB: [birth year] DOA: 12/14/2018  [facility]: [facility]  Admit date: 12/14/2018 Discharge date: 12/18/2018  Admitted From: Home Disposition: Home  Recommendations for Outpatient Follow-up:  Follow up with PCP in 1 week.  Monitor H&H and LFTs during follow-up Follow-up with surgery in 2 weeks as scheduled  Home Health: None Equipment/Devices: None  Discharge Condition: Fair CODE STATUS: Full code Diet recommendation: Regular    Discharge Diagnoses:  Principal Problem:   Choledocholithiasis with acute cholecystitis   Active Problems:   Thrombocytopenia, unspecified (HCC)   Depression   Anemia, unspecified   Tobacco abuse   Constipation due to slow transit Transaminitis  Brief narrative/HPI 48 year old female with history of asthma, sickle cell trait, tobacco abuse, depression, chronic thrombocytopenia presented with abdominal pain, nausea and vomiting with elevated LFTs.  MRCP showed stones in CBD.  General surgery consulted and patient underwent laparoscopic cholecystectomy on 1/8.  Hospital course  Principal Problem:   Choledocholithiasis with acute cholecystitis  MRCP negative for CBD stone and findings of chronic cholecystitis.  Underwent laparoscopic cholecystectomy, IOC not done.    LFTs slowly trending down.  Tolerating advance diet and pain improved. Follow-up with surgery as outpatient Prescribed pain medications and stool softeners  Active Problems:   Thrombocytopenia, unspecified ([place]) Platelets around baseline.  Follow as outpatient.    Depression Continue home dose Abilify    Tobacco abuse Counseled strongly on cessation.  Plans to quit.  Chronic constipation Improved with twice daily MiraLAX, senna and Colace.  Prescribe stool softeners.  Anemia Possibly of chronic disease.  Iron panel negative for iron deficiency.  Mild drop in H&H postop.  Follow as outpatient.  Patient should  discontinue NSAIDs (takes both ibuprofen and meloxicam).  Family Communication  : None at bedside  Disposition Plan  : Home   Consults  : GI, [facility]  Procedures  : MRCP, laparoscopic cholecystectomy   Discharge Instructions   Allergies as of 12/18/2018      Reactions   Levaquin [levofloxacin In D5w] Anaphylaxis   Penicillins Swelling   DID THE REACTION INVOLVE: Swelling of the face/tongue/throat, SOB, or low BP? Yes Sudden or severe rash/hives, skin peeling, or the inside of the mouth or nose? No Did it require medical treatment? Yes When did it last happen?8 years ago If all above answers are "NO", may proceed with cephalosporin use.      Medication List    STOP taking these medications   acetaminophen 650 MG CR tablet Commonly known as:  TYLENOL  ibuprofen 200 MG tablet Commonly known as:  ADVIL,MOTRIN   meloxicam 15 MG tablet Commonly known as:  MOBIC         TAKE these medications   ARIPiprazole 15 MG tablet Commonly known as:  ABILIFY Take 15 mg by mouth at bedtime.   HYDROcodone-acetaminophen 5-325 MG tablet Commonly known as:  NORCO/VICODIN Take 2 tablets by mouth every 6 (six) hours as needed for moderate pain.       ondansetron 4 MG disintegrating tablet Commonly known as:  ZOFRAN-ODT Take 1 tablet (4 mg total) by mouth every 8 (eight) hours as needed for nausea or vomiting.   polyethylene glycol packet Commonly known as:  MIRALAX / GLYCOLAX Take 17 g by mouth daily for 10 days.   senna-docusate 8.6-50 MG tablet Commonly known as:  Senokot-S Take 1 tablet by mouth 2 (two) times daily as needed for mild constipation.   sertraline 25 MG tablet Commonly known as:  ZOLOFT Take 25 mg by mouth at bedtime.   traMADol 50 MG tablet Commonly known as:  ULTRAM Take 1 tablet (50 mg total) by mouth every 6 (six) hours as needed.      Follow-up Information    [facility].   Specialty:  Internal Medicine Contact  information: [place] [address] [facility], [place]. Go on 12/29/2018.   Specialty:  General Surgery Why:  Your appointment is 01/21 at 11:45 AM Please arrive 30 minutes prior to your appointment to check in and fill out paperwork.  Bring photo ID and insurance information. Contact information: [place] [phone]         Allergies  Allergen Reactions  . Levaquin [Levofloxacin In D5w] Anaphylaxis  . Penicillins Swelling    DID THE REACTION INVOLVE: Swelling of the face/tongue/throat, SOB, or low BP? Yes Sudden or severe rash/hives, skin peeling, or the inside of the mouth or nose? No Did it require medical treatment? Yes When did it last happen?8 years ago If all above answers are "NO", may proceed with cephalosporin use.      Procedures/Studies: Mr 3d Recon At Scanner  Result Date: 12/15/2018 CLINICAL DATA:  Epigastric and upper abdominal pain. Cholelithiasis on ultrasound. Common bile duct dilatation. EXAM: MRI ABDOMEN WITHOUT AND WITH CONTRAST (INCLUDING MRCP) TECHNIQUE: Multiplanar multisequence MR imaging of the abdomen was performed both before and after the administration of intravenous contrast. Heavily T2-weighted images of the biliary and pancreatic ducts were obtained, and three-dimensional MRCP images were rendered by post processing. CONTRAST:  10 mL Gadavist COMPARISON:  Ultrasound 12/14/2018 FINDINGS: Lower chest:  Lung bases are clear. Hepatobiliary: Multiple gallstones fill the lumen of the gallbladder. Stones range in size from 3 to 10 mm and number approximately 50. No gallbladder wall thickening. No pericholecystic fluid. No gallbladder distension The common bile duct is normal caliber. Measuring 5 mm. There are no filling defects within the common bile duct. There is no intrahepatic biliary duct dilatation. Normal hepatic parenchyma.  No  evidence of hepatic steatosis There are 2 lesions in the hepatic parenchyma which are hyperintense on T2 weighted imaging (image 17 and 7 of series 23. These lesions have questionable peripheral enhancement. Pancreas: Normal pancreatic parenchymal intensity. No ductal dilatation or inflammation. Spleen: Normal spleen. Adrenals/urinary tract: Adrenal glands and kidneys are normal. Stomach/Bowel: Stomach and limited of the small bowel is unremarkable Vascular/Lymphatic: Abdominal aortic normal caliber. No retroperitoneal periportal lymphadenopathy. Musculoskeletal: No aggressive osseous lesion IMPRESSION: 1. Cholelithiasis without evidence cholecystitis. 2. No biliary duct dilatation.  No choledocholithiasis. 3. Normal pancreas. 4. Benign hepatic cysts or hemangiomas. Electronically Signed   By: Suzy Bouchard M.D.   On: 12/15/2018 07:49   Dg Abd Acute W/chest  Result Date: 12/14/2018 CLINICAL DATA:  Abdominal pain, nausea EXAM: DG ABDOMEN ACUTE W/ 1V CHEST COMPARISON:  06/02/2018 FINDINGS: Peribronchial thickening compatible with bronchitis. Heart is normal size. No confluent opacities or effusions. Numerous gallstones noted in the right upper quadrant, likely filling the gallbladder. No evidence of bowel obstruction or free air. No organomegaly. No acute bony abnormality. IMPRESSION: Peribronchial thickening compatible with bronchitis. Gallstones fill the gallbladder. No evidence of bowel obstruction or free air. Electronically Signed   By: Rolm Baptise M.D.   On: 12/14/2018 12:17   Mr Abdomen Mrcp Moise Boring Contast  Result Date: 12/15/2018 CLINICAL DATA:  Epigastric and upper abdominal pain. Cholelithiasis on ultrasound.  Common bile duct dilatation. EXAM: MRI ABDOMEN WITHOUT AND WITH CONTRAST (INCLUDING MRCP) TECHNIQUE: Multiplanar multisequence MR imaging of the abdomen was performed both before and after the administration of intravenous contrast. Heavily T2-weighted images of the biliary and pancreatic ducts  were obtained, and three-dimensional MRCP images were rendered by post processing. CONTRAST:  10 mL Gadavist COMPARISON:  Ultrasound 12/14/2018 FINDINGS: Lower chest:  Lung bases are clear. Hepatobiliary: Multiple gallstones fill the lumen of the gallbladder. Stones range in size from 3 to 10 mm and number approximately 50. No gallbladder wall thickening. No pericholecystic fluid. No gallbladder distension The common bile duct is normal caliber. Measuring 5 mm. There are no filling defects within the common bile duct. There is no intrahepatic biliary duct dilatation. Normal hepatic parenchyma.  No evidence of hepatic steatosis There are 2 lesions in the hepatic parenchyma which are hyperintense on T2 weighted imaging (image 17 and 7 of series 23. These lesions have questionable peripheral enhancement. Pancreas: Normal pancreatic parenchymal intensity. No ductal dilatation or inflammation. Spleen: Normal spleen. Adrenals/urinary tract: Adrenal glands and kidneys are normal. Stomach/Bowel: Stomach and limited of the small bowel is unremarkable Vascular/Lymphatic: Abdominal aortic normal caliber. No retroperitoneal periportal lymphadenopathy. Musculoskeletal: No aggressive osseous lesion IMPRESSION: 1. Cholelithiasis without evidence cholecystitis. 2. No biliary duct dilatation.  No choledocholithiasis. 3. Normal pancreas. 4. Benign hepatic cysts or hemangiomas. Electronically Signed   By: Suzy Bouchard M.D.   On: 12/15/2018 07:49   US Abdomen Limited Ruq  Result Date: 12/14/2018 CLINICAL DATA:  Upper abdominal pain EXAM: ULTRASOUND ABDOMEN LIMITED RIGHT UPPER QUADRANT COMPARISON:  None. FINDINGS: Gallbladder: Within the gallbladder, there are multiple echogenic foci which move and shadow consistent with cholelithiasis. Largest gallstone measures 9 mm in length. The gallbladder wall is not appreciably thickened, and there is no pericholecystic fluid. No sonographic Murphy sign noted by sonographer. Common bile  duct: Diameter: 9 mm which is enlarged. No biliary duct mass or calculus evident. No appreciable intrahepatic biliary duct dilatation. Liver: No focal lesion identified. Within normal limits in parenchymal echogenicity. Portal vein is patent on color Doppler imaging with normal direction of blood flow towards the liver. IMPRESSION: Cholelithiasis. No appreciable gallbladder wall thickening or pericholecystic fluid. Prominence of the common hepatic duct without demonstrable biliary duct mass or calculus. If further evaluation of the common bile duct is felt to be warranted, MRCP nonemergently would be the imaging study of choice for further assessment. Electronically Signed   By: Lowella Grip III M.D.   On: 12/14/2018 21:02    (Echo, Carotid, EGD, Colonoscopy, ERCP)    Subjective:   Discharge Exam: Vitals:   12/17/18 2136 12/18/18 0516  BP: 113/80 99/63  Pulse: 87 89  Resp: 20   Temp: 99.6 F (37.6 C) 99.2 F (37.3 C)  SpO2: 98% 95%   Vitals:   12/17/18 0525 12/17/18 1331 12/17/18 2136 12/18/18 0516  BP: 114/71 123/79 113/80 99/63  Pulse: 76 92 87 89  Resp: 16  20   Temp: 99.4 F (37.4 C) 98.1 F (36.7 C) 99.6 F (37.6 C) 99.2 F (37.3 C)  TempSrc: Oral Oral Oral Oral  SpO2: 97% 97% 98% 95%  Weight:      Height:        General: Not in distress HEENT: Pallor present, moist mucosa, supple neck Chest: Clear bilaterally CVS: Normal S1-S2, no murmurs rub or gallop GI: Soft, nondistended, minimal tenderness, laparoscopic site appears clean, bowel sounds present Musculoskeletal: Warm, no edema    The results  of significant diagnostics from this hospitalization (including imaging, microbiology, ancillary and laboratory) are listed below for reference.     Microbiology: Recent Results (from the past 240 hour(s))  Culture, blood (Routine X 2) w Reflex to ID Panel     Status: None (Preliminary result)   Collection Time: 12/15/18  5:46 AM  Result Value Ref Range Status    Specimen Description BLOOD LEFT ARM  Final   Special Requests   Final    BOTTLES DRAWN AEROBIC AND ANAEROBIC Blood Culture adequate volume   Culture   Final    NO GROWTH 3 DAYS Performed at [facility], [address] [place], [place] [address]    Report Status PENDING  Incomplete  Culture, blood (Routine X 2) w Reflex to ID Panel     Status: None (Preliminary result)   Collection Time: 12/15/18  5:53 AM  Result Value Ref Range Status   Specimen Description BLOOD LEFT ARM  Final   Special Requests   Final    BOTTLES DRAWN AEROBIC AND ANAEROBIC Blood Culture results may not be optimal due to an excessive volume of blood received in culture bottles   Culture   Final    NO GROWTH 3 DAYS Performed at [facility], [place] [address], [place], [place] [address]    Report Status PENDING  Incomplete  Surgical pcr screen     Status: None   Collection Time: 12/16/18  8:45 AM  Result Value Ref Range Status   MRSA, PCR NEGATIVE NEGATIVE Final   Staphylococcus aureus NEGATIVE NEGATIVE Final    Comment: (NOTE) The Xpert SA Assay (FDA approved for NASAL specimens in patients 55 years of age and older), is one component of a comprehensive surveillance program. It is not intended to diagnose infection nor to guide or monitor treatment. Performed at [facility], [place] [address] [place], [place] [address]      Labs: BNP (last 3 results) No results for input(s): BNP in the last 8760 hours. Basic Metabolic Panel: Recent Labs  Lab 12/14/18 1352 12/15/18 0559 12/16/18 0400 12/17/18 0242 12/18/18 0602  NA 140 141 140 139 138  K 4.2 3.6 3.7 3.6 3.2*  CL 105 111 111 108 106  CO2 26 24 24 23 25   GLUCOSE 87 106* 91 92 103*  BUN 5* <5* <5* <5* <5*  CREATININE 0.83 0.95 0.76 0.83 0.85  CALCIUM 9.1 8.4* 8.4* 8.4* 8.4*   Liver Function Tests: Recent Labs  Lab 12/14/18 1352 12/15/18 0559 12/16/18 0400 12/17/18 0242 12/18/18 0602  AST 150* 97* 60* 56* 45*  ALT 200* 152*  119* 105* 78*  ALKPHOS 131* 115 100 98 93  BILITOT 8.5* 3.8* 2.4* 3.2* 2.8*  PROT 7.6 6.5 5.9* 6.3* 5.9*  ALBUMIN 4.0 3.3* 3.0* 3.2* 3.1*   Recent Labs  Lab 12/14/18 1352  LIPASE 24   No results for input(s): AMMONIA in the last 168 hours. CBC: Recent Labs  Lab 12/14/18 1352 12/16/18 0400 12/17/18 0242 12/18/18 0602  WBC 6.9 5.0 12.8* 8.8  HGB 11.1* 9.0* 8.2* 7.7*  HCT 31.2* 25.1* 22.6* 22.1*  MCV 81.0 80.4 82.8 82.8  PLT 113* 73* 56* 63*   Cardiac Enzymes: No results for input(s): CKTOTAL, CKMB, CKMBINDEX, TROPONINI in the last 168 hours. BNP: Invalid input(s): POCBNP CBG: No results for input(s): GLUCAP in the last 168 hours. D-Dimer No results for input(s): DDIMER in the last 72 hours. Hgb A1c No results for input(s): HGBA1C in  the last 72 hours. Lipid Profile No results for input(s): CHOL, HDL, LDLCALC, TRIG, CHOLHDL, LDLDIRECT in the last 72 hours. Thyroid function studies No results for input(s): TSH, T4TOTAL, T3FREE, THYROIDAB in the last 72 hours.  Invalid input(s): FREET3 Anemia work up Recent Labs    12/18/18 0926  TIBC 297  IRON 78   Urinalysis    Component Value Date/Time   COLORURINE AMBER (A) 12/14/2018 1755   APPEARANCEUR HAZY (A) 12/14/2018 1755   LABSPEC 1.018 12/14/2018 1755   PHURINE 6.0 12/14/2018 1755   GLUCOSEU NEGATIVE 12/14/2018 1755   HGBUR NEGATIVE 12/14/2018 1755   BILIRUBINUR MODERATE (A) 12/14/2018 1755   KETONESUR 5 (A) 12/14/2018 1755   PROTEINUR 30 (A) 12/14/2018 1755   UROBILINOGEN 1.0 01/30/2015 1110   NITRITE NEGATIVE 12/14/2018 1755   LEUKOCYTESUR NEGATIVE 12/14/2018 1755   Sepsis Labs Invalid input(s): PROCALCITONIN,  WBC,  LACTICIDVEN Microbiology Recent Results (from the past 240 hour(s))  Culture, blood (Routine X 2) w Reflex to ID Panel     Status: None (Preliminary result)   Collection Time: 12/15/18  5:46 AM  Result Value Ref Range Status   Specimen Description BLOOD LEFT ARM  Final   Special Requests    Final    BOTTLES DRAWN AEROBIC AND ANAEROBIC Blood Culture adequate volume   Culture   Final    NO GROWTH 3 DAYS Performed at [facility], [place] [address] [place], [place] [address]    Report Status PENDING  Incomplete  Culture, blood (Routine X 2) w Reflex to ID Panel     Status: None (Preliminary result)   Collection Time: 12/15/18  5:53 AM  Result Value Ref Range Status   Specimen Description BLOOD LEFT ARM  Final   Special Requests   Final    BOTTLES DRAWN AEROBIC AND ANAEROBIC Blood Culture results may not be optimal due to an excessive volume of blood received in culture bottles   Culture   Final    NO GROWTH 3 DAYS Performed at [facility], [place] [address], [place], [place] [address]    Report Status PENDING  Incomplete  Surgical pcr screen     Status: None   Collection Time: 12/16/18  8:45 AM  Result Value Ref Range Status   MRSA, PCR NEGATIVE NEGATIVE Final   Staphylococcus aureus NEGATIVE NEGATIVE Final    Comment: (NOTE) The Xpert SA Assay (FDA approved for NASAL specimens in patients 29 years of age and older), is one component of a comprehensive surveillance program. It is not intended to diagnose infection nor to guide or monitor treatment. Performed at [facility], [place] [address], [place], [place] [address]      Time coordinating discharge: Over 30 minutes  SIGNED:   Louellen Molder, MD  [facility] 12/18/2018, 12:20 PM Pager   If 7PM-7AM, please contact night-coverage [email or web address] Password [email or web address]

## 2018-12-18 NOTE — Progress Notes (Signed)
2 Days Post-Op    CC: Abdominal pain  Subjective: She is doing well with her surgery, up and moving better.  Asking about her facial droop she was told was from Bell's   Objective: Vital signs in last 24 hours: Temp:  [98.1 F (36.7 C)-99.6 F (37.6 C)] 99.2 F (37.3 C) (01/10 0516) Pulse Rate:  [87-92] 89 (01/10 0516) Resp:  [20] 20 (01/09 2136) BP: (99-123)/(63-80) 99/63 (01/10 0516) SpO2:  [95 %-98 %] 95 % (01/10 0516) Last BM Date: 12/17/18 240 p.o. recorded Stool x1 urine x1 recorded Afebrile vital signs are stable Hemoglobin hematocrit are down 7.7/22 Hemoglobin:11.1>>9.0>>8.2>>7.7 Bilirubin 3.8>>2.4>>3.2>>2.8 Intake/Output from previous day: 01/09 0701 - 01/10 0700 In: 240 [P.O.:240] Out: -  Intake/Output this shift: No intake/output data recorded.  General appearance: alert, cooperative and no distress Resp: clear to auscultation bilaterally GI: Soft, sore, port sites all look fine.  Top port sites the most uncomfortable.  Lab Results:  Recent Labs    12/17/18 0242 12/18/18 0602  WBC 12.8* 8.8  HGB 8.2* 7.7*  HCT 22.6* 22.1*  PLT 56* 63*    BMET Recent Labs    12/16/18 0400 12/17/18 0242  NA 140 139  K 3.7 3.6  CL 111 108  CO2 24 23  GLUCOSE 91 92  BUN <5* <5*  CREATININE 0.76 0.83  CALCIUM 8.4* 8.4*   PT/INR No results for input(s): LABPROT, INR in the last 72 hours.  Recent Labs  Lab 12/14/18 1352 12/15/18 0559 12/16/18 0400 12/17/18 0242  AST 150* 97* 60* 56*  ALT 200* 152* 119* 105*  ALKPHOS 131* 115 100 98  BILITOT 8.5* 3.8* 2.4* 3.2*  PROT 7.6 6.5 5.9* 6.3*  ALBUMIN 4.0 3.3* 3.0* 3.2*     Lipase     Component Value Date/Time   LIPASE 24 12/14/2018 1352     Medications: . ARIPiprazole  15 mg Oral QHS  . nicotine  21 mg Transdermal Daily  . polyethylene glycol  17 g Oral BID  . psyllium  1 packet Oral QHS  . scopolamine  1 patch Transdermal Q72H  . senna-docusate  2 tablet Oral BID    Assessment/Plan  History  of anxiety and depression Hx Bell's palsy Hx sickle cell trait Hx constipation Anemia HBG 11.1>>9.0>>8.2>>7.7    Hx choledocholithiasis with negative MRCP  - Bilirubin 3.8>>2.4>>3.2>>2.8  Laparoscopic cholecystectomy 12/16/2018, [name] POD #2  FEN: IV fluids/regular diet ID: Vancomycin preop DVT: SCDs. Follow-up: [facility], 12/29/2018 at 11:45 AM  Plan: LFTs are back bilirubin is down hemoglobin/ hematocrit are also down.  From our standpoint she can be discharged home.  Would recommend follow-up with her PCP to follow her LFTs and anemia.  : LOS: 3 days    Tina Mayo 12/18/2018 [phone]

## 2018-12-18 NOTE — Progress Notes (Signed)
Patient discharged to home with instructions and prescriptions. 

## 2018-12-18 NOTE — Social Work (Signed)
CSW received call from Sherlynn Carbon, [place].  Pt husband had left to go to work, pt appropriate for discharge.  Was able to provide cab voucher for pt to discharge to home address confirmed as: [place], [place], [address].   CSW signing off. Please consult if any additional needs arise.  Alexander [place], [place] [facility] [phone]

## 2018-12-20 LAB — CULTURE, BLOOD (ROUTINE X 2)
Culture: NO GROWTH
Culture: NO GROWTH
Special Requests: ADEQUATE

## 2018-12-28 ENCOUNTER — Ambulatory Visit
Admission: EM | Admit: 2018-12-28 | Discharge: 2018-12-28 | Disposition: A | Discharge status: Home / Self Care | Payer: Medicaid Other | Attending: Physician Assistant | Admitting: Physician Assistant

## 2018-12-28 DIAGNOSIS — R21 Rash and other nonspecific skin eruption: Secondary | ICD-10-CM | POA: Diagnosis not present

## 2018-12-28 MED ORDER — PREDNISONE 10 MG PO TABS: ORAL_TABLET | ORAL | 0 refills | Status: DC

## 2018-12-28 NOTE — ED Provider Notes (Signed)
[name]    CSN: [id number] Arrival date & time: 12/28/18  1002     History   Chief Complaint Chief Complaint  Patient presents with  . Rash    HPI Tina Mayo is a 48 y.o. female.   The history is provided by the patient. No language interpreter was used.  Rash  Location:  Face Facial rash location:  Face Quality: blistering and dryness   Severity:  Moderate Onset quality:  Gradual Duration:  2 days Timing:  Constant Progression:  Worsening Chronicity:  New Relieved by:  Nothing Worsened by:  Nothing Associated symptoms: no nausea   Pt had a cholecystectomy earlier this month.  Pt reports she started back on zoloft and ability on Friday. Pt reports her face began breaking out on Saturday.  Pt complains of itching and rash on her face.   Past Medical History:  Diagnosis Date  . Anxiety   . Asthma   . Bell's palsy   . Depression   . Sickle cell trait [facility])     Patient Active Problem List   Diagnosis Date Noted  . Constipation due to slow transit 12/18/2018  . Constipation   . Choledocholithiasis 12/14/2018  . RUQ abdominal pain   . Trichimoniasis 05/03/2015  . Pap smear for cervical cancer screening 05/02/2015  . Perimenopausal 05/02/2015  . Vaginal discharge 05/02/2015  . Bell's palsy 02/28/2015  . Insomnia 12/26/2013  . Cystic breast 12/26/2013  . Tobacco abuse 09/21/2013  . Chest pain 08/26/2013  . Thrombocytopenia, unspecified ([place]) 08/26/2013  . Cardiomegaly 08/26/2013  . Sickle cell trait ([place]) 08/26/2013  . Abnormal CT of the chest 08/26/2013  . Depression 08/26/2013  . Anemia, unspecified 08/26/2013    Past Surgical History:  Procedure Laterality Date  . benign breast tumor removed     2006  . BREAST BIOPSY Right 2014   benign  . BREAST EXCISIONAL BIOPSY Right 2007   benign   . CHOLECYSTECTOMY N/A 12/16/2018   Procedure: LAPAROSCOPIC CHOLECYSTECTOMY;  Surgeon: Clovis Riley, MD;  Location: [facility];  Service:  General;  Laterality: N/A;  . right ankle surgery     2010  . TUBAL LIGATION      OB History   No obstetric history on file.      Home Medications    Prior to Admission medications   Medication Sig Start Date End Date Taking? Authorizing Provider  sertraline (ZOLOFT) 25 MG tablet Take 25 mg by mouth at bedtime.  01/23/17  Yes [provider]  ARIPiprazole (ABILIFY) 15 MG tablet Take 15 mg by mouth at bedtime. 01/23/17   [provider]  HYDROcodone-acetaminophen (NORCO/VICODIN) 5-325 MG tablet Take 2 tablets by mouth every 6 (six) hours as needed for moderate pain. 12/17/18   Dhungel, Nishant, MD  ondansetron (ZOFRAN-ODT) 4 MG disintegrating tablet Take 1 tablet (4 mg total) by mouth every 8 (eight) hours as needed for nausea or vomiting. 12/14/18   Vanessa Kick, MD  predniSONE (DELTASONE) 10 MG tablet 6,5,4,3,2,1 12/28/18   Fransico Meadow, PA-C  senna-docusate (SENOKOT-S) 8.6-50 MG tablet Take 1 tablet by mouth 2 (two) times daily as needed for mild constipation. 12/17/18 12/17/19  Dhungel, Flonnie Overman, MD  traMADol (ULTRAM) 50 MG tablet Take 1 tablet (50 mg total) by mouth every 6 (six) hours as needed. 12/07/18   Orvan July, NP    Family History Family History  Problem Relation Age of Onset  . Breast cancer Mother   . Cancer Mother   .  Diabetes Mellitus II Father   . Diabetes Father   . Breast cancer Maternal Grandmother     Social History Social History   Tobacco Use  . Smoking status: Current Every Day Smoker    Packs/day: 0.10    Types: Cigarettes  . Smokeless tobacco: Current User  Substance Use Topics  . Alcohol use: No  . Drug use: No     Allergies   Levaquin [levofloxacin in d5w] and Penicillins   Review of Systems Review of Systems  Gastrointestinal: Negative for nausea.  Skin: Positive for rash.  All other systems reviewed and are negative.    Physical Exam Triage Vital Signs ED Triage Vitals  Enc Vitals Group     BP 12/28/18 1015  111/77     Pulse Rate 12/28/18 1015 90     Resp 12/28/18 1015 18     Temp 12/28/18 1015 97.8 F (36.6 C)     Temp src --      SpO2 12/28/18 1015 100 %     Weight --      Height --      Head Circumference --      Peak Flow --      Pain Score 12/28/18 1016 8     Pain Loc --      Pain Edu? --      Excl. in [place]? --    No data found.  Updated Vital Signs BP 111/77 (BP Location: Left Arm)   Pulse 90   Temp 97.8 F (36.6 C)   Resp 18   LMP 01/12/2017   SpO2 100%   Visual Acuity Right Eye Distance:   Left Eye Distance:   Bilateral Distance:    Right Eye Near:   Left Eye Near:    Bilateral Near:     Physical Exam Vitals signs reviewed.  HENT:     Head: Normocephalic.     Right Ear: Tympanic membrane normal.     Left Ear: Tympanic membrane normal.     Nose: Nose normal.     Mouth/Throat:     Mouth: Mucous membranes are moist.  Eyes:     Pupils: Pupils are equal, round, and reactive to light.  Neck:     Musculoskeletal: Normal range of motion.  Cardiovascular:     Rate and Rhythm: Normal rate.  Pulmonary:     Effort: Pulmonary effort is normal.  Abdominal:     General: Abdomen is flat.  Musculoskeletal: Normal range of motion.  Skin:    General: Skin is warm.     Capillary Refill: Capillary refill takes less than 2 seconds.  Neurological:     General: No focal deficit present.     Mental Status: She is alert.  Psychiatric:        Mood and Affect: Mood normal.      UC Treatments / Results  Labs (all labs ordered are listed, but only abnormal results are displayed) Labs Reviewed - No data to display  EKG None  Radiology No results found.  Procedures Procedures (including critical care time)  Medications Ordered in UC Medications - No data to display  Initial Impression / Assessment and Plan / UC Course  I have reviewed the triage vital signs and the nursing notes.  Pertinent labs & imaging results that were available during my care of the  patient were reviewed by me and considered in my medical decision making (see chart for details).     MDM   I  am unsure what pt may have caused rash.  I will treat pt with prednisone and benadryl  Final Clinical Impressions(s) / UC Diagnoses   Final diagnoses:  Facial rash     Discharge Instructions     Benadryl 2 tablets every 4 hours.  Prednisone as directed.  See the Surgeon as scheduled    ED Prescriptions    Medication Sig Dispense Auth. Provider   predniSONE (DELTASONE) 10 MG tablet 6,5,4,3,2,1 21 tablet Fransico Meadow, [place]     Controlled Substance Prescriptions [place] Controlled Substance Registry consulted? Not Applicable   Fransico Meadow, [place] 12/28/18 1226

## 2018-12-28 NOTE — ED Triage Notes (Signed)
Pt also c/o pain to surgery site on abdomen. Has appt with surgeon tomorrow.

## 2018-12-28 NOTE — ED Triage Notes (Signed)
Pt states started her Abilify and Zoloft on Friday and woke up Saturday morning with a burning rash to face. States baby oil is the only relief. Took benadryl with no relief

## 2018-12-28 NOTE — Discharge Instructions (Signed)
Benadryl 2 tablets every 4 hours.  Prednisone as directed.  See the Surgeon as scheduled

## 2018-12-30 ENCOUNTER — Ambulatory Visit
Admission: RE | Admit: 2018-12-30 | Discharge: 2018-12-30 | Disposition: A | Discharge status: Home / Self Care | Payer: Medicaid Other | Source: Ambulatory Visit | Attending: Internal Medicine | Admitting: Internal Medicine

## 2018-12-30 DIAGNOSIS — Z1231 Encounter for screening mammogram for malignant neoplasm of breast: Secondary | ICD-10-CM

## 2019-02-08 ENCOUNTER — Other Ambulatory Visit: Payer: Self-pay

## 2019-02-08 ENCOUNTER — Encounter (HOSPITAL_COMMUNITY): Payer: Self-pay | Admitting: Emergency Medicine

## 2019-02-08 ENCOUNTER — Ambulatory Visit (HOSPITAL_COMMUNITY)
Admission: EM | Admit: 2019-02-08 | Discharge: 2019-02-08 | Disposition: A | Discharge status: Home / Self Care | Payer: Medicaid Other | Attending: Family Medicine | Admitting: Family Medicine

## 2019-02-08 DIAGNOSIS — H1032 Unspecified acute conjunctivitis, left eye: Secondary | ICD-10-CM

## 2019-02-08 MED ORDER — POLYMYXIN B-TRIMETHOPRIM 10000-0.1 UNIT/ML-% OP SOLN: 1.0000 [drp] | OPHTHALMIC | 0 refills | Status: AC

## 2019-02-08 NOTE — ED Provider Notes (Signed)
[place]    CSN: [id number] Arrival date & time: 02/08/19  3818     History   Chief Complaint Chief Complaint  Patient presents with  . Eye Problem    HPI Tina Mayo is a 48 y.o. female.   Tina Mayo presents with complaints of left eye drainage and burning which started this morning. States [place] and feels she may have contracted illness there. No injury to the eye. Mattering this am. Still with discharge. No fevers. No other URI symptoms. Wears glasses for reading but no contacts. No vision change or loss. No dizziness. No lid pain or swelling. Hx of anxiety, asthma, bell's palsy, depression.     ROS per HPI.      Past Medical History:  Diagnosis Date  . Anxiety   . Asthma   . Bell's palsy   . Depression   . Sickle cell trait [facility])     Patient Active Problem List   Diagnosis Date Noted  . Constipation due to slow transit 12/18/2018  . Constipation   . Choledocholithiasis 12/14/2018  . RUQ abdominal pain   . Trichimoniasis 05/03/2015  . Pap smear for cervical cancer screening 05/02/2015  . Perimenopausal 05/02/2015  . Vaginal discharge 05/02/2015  . Bell's palsy 02/28/2015  . Insomnia 12/26/2013  . Cystic breast 12/26/2013  . Tobacco abuse 09/21/2013  . Chest pain 08/26/2013  . Thrombocytopenia, unspecified ([place]) 08/26/2013  . Cardiomegaly 08/26/2013  . Sickle cell trait ([place]) 08/26/2013  . Abnormal CT of the chest 08/26/2013  . Depression 08/26/2013  . Anemia, unspecified 08/26/2013    Past Surgical History:  Procedure Laterality Date  . benign breast tumor removed     2006  . BREAST BIOPSY Right 2014   benign  . BREAST EXCISIONAL BIOPSY Right 2007   benign   . CHOLECYSTECTOMY N/A 12/16/2018   Procedure: LAPAROSCOPIC CHOLECYSTECTOMY;  Surgeon: Clovis Riley, MD;  Location: [facility];  Service: General;  Laterality: N/A;  . right ankle surgery     2010  . TUBAL LIGATION      OB History     No obstetric history on file.      Home Medications    Prior to Admission medications   Medication Sig Start Date End Date Taking? Authorizing Provider  ARIPiprazole (ABILIFY) 15 MG tablet Take 15 mg by mouth at bedtime. 01/23/17  Yes [provider]  sertraline (ZOLOFT) 25 MG tablet Take 25 mg by mouth at bedtime.  01/23/17  Yes [provider]  ondansetron (ZOFRAN-ODT) 4 MG disintegrating tablet Take 1 tablet (4 mg total) by mouth every 8 (eight) hours as needed for nausea or vomiting. 12/14/18   Vanessa Kick, MD  senna-docusate (SENOKOT-S) 8.6-50 MG tablet Take 1 tablet by mouth 2 (two) times daily as needed for mild constipation. 12/17/18 12/17/19  Dhungel, Flonnie Overman, MD  trimethoprim-polymyxin b (POLYTRIM) ophthalmic solution Place 1 drop into the left eye every 4 (four) hours for 5 days. 02/08/19 02/13/19  Zigmund Gottron, NP    Family History Family History  Problem Relation Age of Onset  . Breast cancer Mother   . Cancer Mother   . Diabetes Mellitus II Father   . Diabetes Father   . Breast cancer Maternal Grandmother     Social History Social History   Tobacco Use  . Smoking status: Current Every Day Smoker    Packs/day: 0.10    Types: Cigarettes  . Smokeless tobacco: Current  User  Substance Use Topics  . Alcohol use: No  . Drug use: No     Allergies   Levaquin [levofloxacin in d5w] and Penicillins   Review of Systems Review of Systems   Physical Exam Triage Vital Signs ED Triage Vitals  Enc Vitals Group     BP 02/08/19 0842 108/73     Pulse Rate 02/08/19 0842 80     Resp 02/08/19 0842 16     Temp 02/08/19 0842 98.5 F (36.9 C)     Temp Source 02/08/19 0842 Oral     SpO2 02/08/19 0842 99 %     Weight --      Height --      Head Circumference --      Peak Flow --      Pain Score 02/08/19 0853 9     Pain Loc --      Pain Edu? --      Excl. in [place]? --    No data found.  Updated Vital Signs BP 108/73 (BP Location: Left Arm)   Pulse 80    Temp 98.5 F (36.9 C) (Oral)   Resp 16   LMP 01/12/2017   SpO2 99%   Visual Acuity Right Eye Distance:   Left Eye Distance:   Bilateral Distance:    Right Eye Near: R Near: 20/25 Left Eye Near:  L Near: 20/25 Bilateral Near:  20/25  Physical Exam Constitutional:      General: She is not in acute distress.    Appearance: She is well-developed.  HENT:     Head: Normocephalic and atraumatic.     Right Ear: Tympanic membrane, ear canal and external ear normal.     Left Ear: Tympanic membrane, ear canal and external ear normal.     Nose: Nose normal.     Mouth/Throat:     Pharynx: Uvula midline.     Tonsils: No tonsillar exudate.  Eyes:     General: Lids are normal.        Left eye: Discharge present.No hordeolum.     Extraocular Movements: Extraocular movements intact.     Pupils: Pupils are equal, round, and reactive to light.     Comments: Very mild left eye injection noted with purulent discharge visible   Cardiovascular:     Rate and Rhythm: Normal rate and regular rhythm.     Heart sounds: Normal heart sounds.  Pulmonary:     Effort: Pulmonary effort is normal.     Breath sounds: Normal breath sounds.  Skin:    General: Skin is warm and dry.  Neurological:     Mental Status: She is alert and oriented to person, place, and time.      UC Treatments / Results  Labs (all labs ordered are listed, but only abnormal results are displayed) Labs Reviewed - No data to display  EKG None  Radiology No results found.  Procedures Procedures (including critical care time)  Medications Ordered in UC Medications - No data to display  Initial Impression / Assessment and Plan / UC Course  I have reviewed the triage vital signs and the nursing notes.  Pertinent labs & imaging results that were available during my care of the patient were reviewed by me and considered in my medical decision making (see chart for details).     Discharge from left eye with burning  and mattering. Visual acuity wnl. Covering with polytrim. Return precautions provided. If symptoms worsen or do not improve  in the next week to return to be seen or to follow up with PCP and/or ophthalmology.  Patient verbalized understanding and agreeable to plan.   Final Clinical Impressions(s) / UC Diagnoses   Final diagnoses:  Acute conjunctivitis of left eye, unspecified acute conjunctivitis type     Discharge Instructions     Complete drops as prescribed.  Wash hands regularly.  If develop any worsening of symptoms, fevers, vision change, or otherwise worsening please return or follow up with eye doctor.     ED Prescriptions    Medication Sig Dispense Auth. Provider   trimethoprim-polymyxin b (POLYTRIM) ophthalmic solution Place 1 drop into the left eye every 4 (four) hours for 5 days. 10 mL Zigmund Gottron, NP     Controlled Substance Prescriptions [place] Controlled Substance Registry consulted? Not Applicable   Zigmund Gottron, NP 02/08/19 [phone]

## 2019-02-08 NOTE — Discharge Instructions (Signed)
Complete drops as prescribed.  Wash hands regularly.  If develop any worsening of symptoms, fevers, vision change, or otherwise worsening please return or follow up with eye doctor.

## 2019-02-08 NOTE — ED Triage Notes (Addendum)
Left eye crusty and burning this morning when waking.  Patient sent home from work  Patient needs a work note

## 2019-02-24 ENCOUNTER — Other Ambulatory Visit: Payer: Self-pay

## 2019-02-24 ENCOUNTER — Encounter: Payer: Self-pay | Admitting: Emergency Medicine

## 2019-02-24 ENCOUNTER — Ambulatory Visit
Admission: EM | Admit: 2019-02-24 | Discharge: 2019-02-24 | Disposition: A | Discharge status: Home / Self Care | Payer: Medicaid Other | Attending: Emergency Medicine | Admitting: Emergency Medicine

## 2019-02-24 DIAGNOSIS — L989 Disorder of the skin and subcutaneous tissue, unspecified: Secondary | ICD-10-CM

## 2019-02-24 MED ORDER — TRIAMCINOLONE ACETONIDE 0.1 % EX CREA: 1.0000 "application " | TOPICAL_CREAM | Freq: Two times a day (BID) | CUTANEOUS | 0 refills | Status: DC

## 2019-02-24 MED ORDER — MUPIROCIN 2 % EX OINT: 1.0000 "application " | TOPICAL_OINTMENT | Freq: Three times a day (TID) | CUTANEOUS | 0 refills | Status: DC

## 2019-02-24 NOTE — ED Provider Notes (Signed)
HPI  SUBJECTIVE:  Tina Mayo is a 48 y.o. female who presents with a a possible "spider bite" on the dorsum of her left hand that she noticed last night.  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  She does not recall a specific bite.  She reports constant burning, itching pain along the hand.  Questionable drainage.  No erythema, swelling, crusting.  No fevers, body aches.  No contacts with MRSA.  She tried Neosporin last night with improvement in her symptoms.  Symptoms are worse with wearing gloves.  She is a smoker.  No history of HIV, diabetes, hypertension, MRSA, artificial heart valves or joints LMP: Post menopausal.  PMD: [facility].    Past Medical History:  Diagnosis Date  . Anxiety   . Asthma   . Bell's palsy   . Depression   . Sickle cell trait [facility])     Past Surgical History:  Procedure Laterality Date  . benign breast tumor removed     2006  . BREAST BIOPSY Right 2014   benign  . BREAST EXCISIONAL BIOPSY Right 2007   benign   . CHOLECYSTECTOMY N/A 12/16/2018   Procedure: LAPAROSCOPIC CHOLECYSTECTOMY;  Surgeon: Clovis Riley, MD;  Location: [facility];  Service: General;  Laterality: N/A;  . right ankle surgery     2010  . TUBAL LIGATION      Family History  Problem Relation Age of Onset  . Breast cancer Mother   . Cancer Mother   . Diabetes Mellitus II Father   . Diabetes Father   . Breast cancer Maternal Grandmother     Social History   Tobacco Use  . Smoking status: Current Every Day Smoker    Packs/day: 0.10    Types: Cigarettes  . Smokeless tobacco: Current User  Substance Use Topics  . Alcohol use: No  . Drug use: No    No current facility-administered medications for this encounter.   Current Outpatient Medications:  .  ARIPiprazole (ABILIFY) 15 MG tablet, Take 15 mg by mouth at bedtime., Disp: , Rfl: 1 .  mupirocin ointment (BACTROBAN) 2 %, Apply 1 application topically 3 (three) times daily., Disp: 22 g,  Rfl: 0 .  senna-docusate (SENOKOT-S) 8.6-50 MG tablet, Take 1 tablet by mouth 2 (two) times daily as needed for mild constipation., Disp: 30 tablet, Rfl: 0 .  sertraline (ZOLOFT) 25 MG tablet, Take 25 mg by mouth at bedtime. , Disp: , Rfl: 1 .  triamcinolone cream (KENALOG) 0.1 %, Apply 1 application topically 2 (two) times daily. Apply for 2 weeks as needed for itching, Disp: 30 g, Rfl: 0  Allergies  Allergen Reactions  . Levaquin [Levofloxacin In D5w] Anaphylaxis  . Penicillins Swelling    DID THE REACTION INVOLVE: Swelling of the face/tongue/throat, SOB, or low BP? Yes Sudden or severe rash/hives, skin peeling, or the inside of the mouth or nose? No Did it require medical treatment? Yes When did it last happen?8 years ago If all above answers are "NO", may proceed with cephalosporin use.      ROS  As noted in HPI.   Physical Exam  BP 122/83 (BP Location: Left Arm)   Pulse 81   Temp 98.2 F (36.8 C) (Oral)   Resp 16   LMP 01/12/2017   SpO2 99%   Constitutional: Well developed, well nourished, no acute distress Eyes:  EOMI, conjunctiva normal bilaterally HENT: Normocephalic, atraumatic,mucus membranes moist Respiratory: Normal inspiratory effort Cardiovascular: Normal rate GI:  nondistended skin: Small nonerythematous, nontender lesion without induration or swelling dorsum of left hand.  Positive scab.  See picture.     Musculoskeletal: no deformities Neurologic: Alert & oriented x 3, no focal neuro deficits Psychiatric: Speech and behavior appropriate   ED Course   Medications - No data to display  No orders of the defined types were placed in this encounter.   No results found for this or any previous visit (from the past 24 hour(s)). No results found.  ED Clinical Impression  Skin lesion of hand   ED Assessment/Plan  Presentation appears to be consistent with a small scratch of the hand.  Could be a spider bite but it does not appear to be  infected at this point in time in the absence of erythema, tenderness, edema or induration.  Plan to send home with Bactroban to help prevent secondary infection, triamcinolone ointment and Claritin or Zyrtec for the itching.  Follow-up with PMD or may return here if not getting better we can consider oral antibiotics at that time.  No clear indications for these today.  Discussed MDM, treatment plan, and plan for follow-up with patient.patient agrees with plan.   Meds ordered this encounter  Medications  . mupirocin ointment (BACTROBAN) 2 %    Sig: Apply 1 application topically 3 (three) times daily.    Dispense:  22 g    Refill:  0  . triamcinolone cream (KENALOG) 0.1 %    Sig: Apply 1 application topically 2 (two) times daily. Apply for 2 weeks as needed for itching    Dispense:  30 g    Refill:  0    *This clinic note was created using [employer]. Therefore, there may be occasional mistakes despite careful proofreading.   ?   Melynda Ripple, MD 02/24/19 [phone]

## 2019-02-24 NOTE — ED Triage Notes (Signed)
Pt presents to [facility] for assessment to area to left hand that has been burning her since yesterday, with a small raised area.  States it is itchy as well.

## 2019-02-24 NOTE — ED Notes (Signed)
Patient able to ambulate independently  

## 2019-02-24 NOTE — Discharge Instructions (Addendum)
Try the Bactroban to help prevent infection, triamcinolone for the itching.  Claritin or Zyrtec will also help with itching.  Keep this covered until this heals.  Do allow some dry air time.

## 2019-04-05 ENCOUNTER — Ambulatory Visit: Payer: Medicaid Other | Admitting: Obstetrics & Gynecology

## 2019-04-08 ENCOUNTER — Encounter (HOSPITAL_COMMUNITY): Payer: Self-pay | Admitting: Surgery

## 2019-06-17 NOTE — Patient Instructions (Addendum)
YOU NEED TO HAVE A COVID 19 TEST ON 06-25-19 @ 10:05 AM, THIS TEST MUST BE DONE BEFORE SURGERY, COME TO [facility] ENTRANCE. ONCE YOUR COVID TEST IS COMPLETED, PLEASE BEGIN THE QUARANTINE INSTRUCTIONS AS OUTLINED IN YOUR HANDOUT.                Tina Mayo    Your procedure is scheduled on: 06-29-2019    Report to [facility] Main  Entrance    Report to Admitting at 10:30 AM     Call this number if you have problems the morning of surgery [phone]    Remember: NO SOLID FOOD AFTER MIDNIGHT THE NIGHT PRIOR TO SURGERY. NOTHING BY MOUTH EXCEPT CLEAR LIQUIDS UNTIL 10:00 AM. PLEASE FINISH ENSURE DRINK PER SURGEON ORDER 3 HOURS PRIOR TO SCHEDULED SURGERY TIME WHICH NEEDS TO BE COMPLETED AT 10:00 AM.   CLEAR LIQUID DIET   Foods Allowed                                                                     Foods Excluded  Coffee and tea, regular and decaf                             liquids that you cannot  Plain Jell-O in any flavor                                             see through such as: Fruit ices (not with fruit pulp)                                     milk, soups, orange juice  Iced Popsicles                                    All solid food Carbonated beverages, regular and diet                                    Cranberry, grape and apple juices Sports drinks like Gatorade Lightly seasoned clear broth or consume(fat free) Sugar, honey syrup  Sample Menu Breakfast                                Lunch                                     Supper Cranberry juice                    Beef broth                            Chicken broth Jell-O  Grape juice                           Apple juice Coffee or tea                        Jell-O                                      Popsicle                                                Coffee or tea                        Coffee or  tea  _____________________________________________________________________     Take these medicines the morning of surgery with A SIP OF WATER: None   BRUSH YOUR TEETH MORNING OF SURGERY AND RINSE YOUR MOUTH OUT, NO CHEWING GUM CANDY OR MINTS.                              You may not have any metal on your body including hair pins and              piercings      Do not wear jewelry, make-up, lotions, powders or perfumes, deodorant               Do not wear nail polish.  Do not shave  48 hours prior to surgery.         Do not bring valuables to the hospital. [facility].  Contacts, dentures or bridgework may not be worn into surgery.    Special Instructions: N/A              Please read over the following fact sheets you were given: _____________________________________________________________________             [facility] - Preparing for Surgery Before surgery, you can play an important role.  Because skin is not sterile, your skin needs to be as free of germs as possible.  You can reduce the number of germs on your skin by washing with CHG (chlorahexidine gluconate) soap before surgery.  CHG is an antiseptic cleaner which kills germs and bonds with the skin to continue killing germs even after washing. Please DO NOT use if you have an allergy to CHG or antibacterial soaps.  If your skin becomes reddened/irritated stop using the CHG and inform your nurse when you arrive at Short Stay. Do not shave (including legs and underarms) for at least 48 hours prior to the first CHG shower.  You may shave your face/neck. Please follow these instructions carefully:  1.  Shower with CHG Soap the night before surgery and the  morning of Surgery.  2.  If you choose to wash your hair, wash your hair first as usual with your  normal  shampoo.  3.  After you shampoo, rinse your hair and body thoroughly to remove the  shampoo.  4.  Use CHG as you would any other liquid soap.  You can apply chg directly  to the skin and wash                       Gently with a scrungie or clean washcloth.  5.  Apply the CHG Soap to your body ONLY FROM THE NECK DOWN.   Do not use on face/ open                           Wound or open sores. Avoid contact with eyes, ears mouth and genitals (private parts).                       Wash face,  Genitals (private parts) with your normal soap.             6.  Wash thoroughly, paying special attention to the area where your surgery  will be performed.  7.  Thoroughly rinse your body with warm water from the neck down.  8.  DO NOT shower/wash with your normal soap after using and rinsing off  the CHG Soap.                9.  Pat yourself dry with a clean towel.            10.  Wear clean pajamas.            11.  Place clean sheets on your bed the night of your first shower and do not  sleep with pets. Day of Surgery : Do not apply any lotions/deodorants the morning of surgery.  Please wear clean clothes to the hospital/surgery center.  FAILURE TO FOLLOW THESE INSTRUCTIONS MAY RESULT IN THE CANCELLATION OF YOUR SURGERY PATIENT SIGNATURE_________________________________  NURSE SIGNATURE__________________________________  ________________________________________________________________________   Tina Mayo  An incentive spirometer is a tool that can help keep your lungs clear and active. This tool measures how well you are filling your lungs with each breath. Taking long deep breaths may help reverse or decrease the chance of developing breathing (pulmonary) problems (especially infection) following:  A long period of time when you are unable to move or be active. BEFORE THE PROCEDURE   If the spirometer includes an indicator to show your best effort, your nurse or respiratory therapist will set it to a desired goal.  If possible, Mayo up straight or lean slightly forward. Try  not to slouch.  Hold the incentive spirometer in an upright position. INSTRUCTIONS FOR USE  1. Mayo on the edge of your bed if possible, or Mayo up as far as you can in bed or on a chair. 2. Hold the incentive spirometer in an upright position. 3. Breathe out normally. 4. Place the mouthpiece in your mouth and seal your lips tightly around it. 5. Breathe in slowly and as deeply as possible, raising the piston or the ball toward the top of the column. 6. Hold your breath for 3-5 seconds or for as long as possible. Allow the piston or ball to fall to the bottom of the column. 7. Remove the mouthpiece from your mouth and breathe out normally. 8. Rest for a few seconds and repeat Steps 1 through 7 at least 10 times every 1-2 hours when you are awake. Take your time and take a few normal breaths between deep breaths. 9. The spirometer may include an indicator to show  your best effort. Use the indicator as a goal to work toward during each repetition. 10. After each set of 10 deep breaths, practice coughing to be sure your lungs are clear. If you have an incision (the cut made at the time of surgery), support your incision when coughing by placing a pillow or rolled up towels firmly against it. Once you are able to get out of bed, walk around indoors and cough well. You may stop using the incentive spirometer when instructed by your caregiver.  RISKS AND COMPLICATIONS  Take your time so you do not get dizzy or light-headed.  If you are in pain, you may need to take or ask for pain medication before doing incentive spirometry. It is harder to take a deep breath if you are having pain. AFTER USE  Rest and breathe slowly and easily.  It can be helpful to keep track of a log of your progress. Your caregiver can provide you with a simple table to help with this. If you are using the spirometer at home, follow these instructions: [place] IF:   You are having difficultly using the  spirometer.  You have trouble using the spirometer as often as instructed.  Your pain medication is not giving enough relief while using the spirometer.  You develop fever of 100.5 F (38.1 C) or higher. SEEK IMMEDIATE MEDICAL CARE IF:   You cough up bloody sputum that had not been present before.  You develop fever of 102 F (38.9 C) or greater.  You develop worsening pain at or near the incision site. MAKE SURE YOU:   Understand these instructions.  Will watch your condition.  Will get help right away if you are not doing well or get worse. Document Released: 04/07/2007 Document Revised: 02/17/2012 Document Reviewed: 06/08/2007 [facility] Patient Information 2014 [place], [place].   ________________________________________________________________________

## 2019-06-21 ENCOUNTER — Encounter (HOSPITAL_COMMUNITY): Payer: Self-pay

## 2019-06-21 ENCOUNTER — Other Ambulatory Visit: Payer: Self-pay

## 2019-06-21 ENCOUNTER — Encounter (HOSPITAL_COMMUNITY)
Admission: RE | Admit: 2019-06-21 | Discharge: 2019-06-21 | Disposition: A | Discharge status: Home / Self Care | Payer: Medicaid Other | Source: Ambulatory Visit | Attending: Orthopedic Surgery | Admitting: Orthopedic Surgery

## 2019-06-21 DIAGNOSIS — M87011 Idiopathic aseptic necrosis of right shoulder: Secondary | ICD-10-CM | POA: Insufficient documentation

## 2019-06-21 DIAGNOSIS — Z01818 Encounter for other preprocedural examination: Secondary | ICD-10-CM | POA: Diagnosis not present

## 2019-06-21 LAB — CBC
HCT: 29.6 % — ABNORMAL LOW (ref 36.0–46.0)
Hemoglobin: 10.8 g/dL — ABNORMAL LOW (ref 12.0–15.0)
MCH: 30.5 pg (ref 26.0–34.0)
MCHC: 36.5 g/dL — ABNORMAL HIGH (ref 30.0–36.0)
MCV: 83.6 fL (ref 80.0–100.0)
Platelets: 87 10*3/uL — ABNORMAL LOW (ref 150–400)
RBC: 3.54 MIL/uL — ABNORMAL LOW (ref 3.87–5.11)
RDW: 13.6 % (ref 11.5–15.5)
WBC: 8.8 10*3/uL (ref 4.0–10.5)
nRBC: 0 % (ref 0.0–0.2)

## 2019-06-21 LAB — SURGICAL PCR SCREEN
MRSA, PCR: NEGATIVE
Staphylococcus aureus: NEGATIVE

## 2019-06-22 NOTE — Anesthesia Preprocedure Evaluation (Addendum)
Anesthesia Evaluation  Patient identified by MRN, date of birth, ID band Patient awake    Reviewed: Allergy & Precautions, NPO status , Patient's Chart, lab work & pertinent test results  Airway Mallampati: II  TM Distance: >3 FB Neck ROM: Full    Dental no notable dental hx.    Pulmonary asthma , Current Smoker,    Pulmonary exam normal breath sounds clear to auscultation       Cardiovascular negative cardio ROS Normal cardiovascular exam Rhythm:Regular Rate:Normal     Neuro/Psych Anxiety Depression negative neurological ROS  negative psych ROS   GI/Hepatic negative GI ROS, Neg liver ROS,   Endo/Other  negative endocrine ROS  Renal/GU negative Renal ROS  negative genitourinary   Musculoskeletal negative musculoskeletal ROS (+)   Abdominal   Peds negative pediatric ROS (+)  Hematology negative hematology ROS (+)   Anesthesia Other Findings   Reproductive/Obstetrics negative OB ROS                            Anesthesia Physical Anesthesia Plan  ASA: II  Anesthesia Plan: General   Post-op Pain Management:  Regional for Post-op pain   Induction: Intravenous  PONV Risk Score and Plan: 2 and Ondansetron, Midazolam and Treatment may vary due to age or medical condition  Airway Management Planned: Oral ETT  Additional Equipment:   Intra-op Plan:   Post-operative Plan: Extubation in OR  Informed Consent: I have reviewed the patients History and Physical, chart, labs and discussed the procedure including the risks, benefits and alternatives for the proposed anesthesia with the patient or authorized representative who has indicated his/her understanding and acceptance.     Dental advisory given  Plan Discussed with: CRNA  Anesthesia Plan Comments: (See APP note by Willeen Cass, FNP)       Anesthesia Quick Evaluation

## 2019-06-22 NOTE — Progress Notes (Signed)
Anesthesia Chart Review:   Case: [id number] Date/Time: 06/29/19 1251   Procedure: TOTAL SHOULDER ARTHROPLASTY (Right )   Anesthesia type: Choice   Pre-op diagnosis: AVN right shoulder, djd right shoulder   Location: [facility] ROOM 07 / [facility]   Surgeon: Marchia Bond, MD      DISCUSSION:  Pt is a 48 year old female with hx chronic thrombocytopenia, sickle cell trait, asthma. Current smoker.   Saw hematologist Concha Norway, MD in 2014 (last visit 12/24/13) for asymptomatic thrombocytopenia of unclear etiology.  Since then, platelets have trended around 80 - 113.  (post-op cholecystectomy in 12/2018, platelets were as low as 56, but baseline appears to be around 90-100).    Platelets at presurgical testing were 87, consistent with patient's baseline.    VS: BP 105/78   Pulse 87   Temp 37.6 C (Oral)   Resp 16   Ht 5\' 7"  (1.702 m)   Wt 86.6 kg   LMP 01/12/2017   SpO2 99%   BMI 29.90 kg/m    PROVIDERS: [facility] care at [facility]   LABS: - Platelets 87. This is consistent with pt's baseline.   (all labs ordered are listed, but only abnormal results are displayed)  Labs Reviewed  CBC - Abnormal; Notable for the following components:      Result Value   RBC 3.54 (*)    Hemoglobin 10.8 (*)    HCT 29.6 (*)    MCHC 36.5 (*)    Platelets 87 (*)    All other components within normal limits  SURGICAL PCR SCREEN    EKG 06/21/19: NSR. Cannot rule out Anterior infarct, age undetermined. Appears stable when compared to EKG 06/02/18.    CV:  Echo 08/26/13:  - I cannot locate report in Epic. Per discharge summary 08/27/13 (for hospitalization when echo was done), echo was "normal"   Past Medical History:  Diagnosis Date  . Anxiety   . Asthma   . Bell's palsy   . Depression   . Sickle cell trait [facility])     Past Surgical History:  Procedure Laterality Date  . benign breast tumor removed     2006  . BREAST BIOPSY Right 2014   benign  . BREAST EXCISIONAL BIOPSY Right 2007    benign   . CHOLECYSTECTOMY N/A 12/16/2018   Procedure: LAPAROSCOPIC CHOLECYSTECTOMY;  Surgeon: Clovis Riley, MD;  Location: [facility];  Service: General;  Laterality: N/A;  . right ankle surgery     2010  . TUBAL LIGATION      MEDICATIONS: . ARIPiprazole (ABILIFY) 10 MG tablet  . diclofenac sodium (VOLTAREN) 1 % GEL  . ibuprofen (ADVIL) 800 MG tablet  . Lidocaine 4 % PTCH  . meloxicam (MOBIC) 15 MG tablet  . Menthol-Methyl Salicylate (MUSCLE RUB) 10-15 % CREA  . mupirocin ointment (BACTROBAN) 2 %  . OVER THE COUNTER MEDICATION  . senna-docusate (SENOKOT-S) 8.6-50 MG tablet  . sertraline (ZOLOFT) 50 MG tablet  . triamcinolone cream (KENALOG) 0.1 %   No current facility-administered medications for this encounter.     If no changes, I anticipate pt can proceed with surgery as scheduled.   Willeen Cass, FNP-BC [facility] Short Stay Surgical Center/Anesthesiology Phone: [phone] 06/22/2019 1:09 PM

## 2019-06-25 ENCOUNTER — Other Ambulatory Visit (HOSPITAL_COMMUNITY)
Admission: RE | Admit: 2019-06-25 | Discharge: 2019-06-25 | Disposition: A | Discharge status: Home / Self Care | Payer: Medicaid Other | Source: Ambulatory Visit | Attending: Orthopedic Surgery | Admitting: Orthopedic Surgery

## 2019-06-25 DIAGNOSIS — Z1159 Encounter for screening for other viral diseases: Secondary | ICD-10-CM | POA: Insufficient documentation

## 2019-06-25 LAB — SARS CORONAVIRUS 2 (TAT 6-24 HRS): SARS Coronavirus 2: NEGATIVE

## 2019-06-29 ENCOUNTER — Encounter (HOSPITAL_COMMUNITY)
Admission: RE | Disposition: A | Discharge status: Home / Self Care | Payer: Self-pay | Source: Home / Self Care | Attending: Orthopedic Surgery

## 2019-06-29 ENCOUNTER — Inpatient Hospital Stay (HOSPITAL_COMMUNITY): Payer: Medicaid Other

## 2019-06-29 ENCOUNTER — Inpatient Hospital Stay (HOSPITAL_COMMUNITY)
Admission: RE | Admit: 2019-06-29 | Discharge: 2019-07-04 | DRG: 483 | Disposition: A | Discharge status: Home / Self Care | Payer: Medicaid Other | Attending: Orthopedic Surgery | Admitting: Orthopedic Surgery

## 2019-06-29 ENCOUNTER — Encounter (HOSPITAL_COMMUNITY): Payer: Self-pay | Admitting: Anesthesiology

## 2019-06-29 ENCOUNTER — Inpatient Hospital Stay (HOSPITAL_COMMUNITY): Payer: Medicaid Other | Admitting: Emergency Medicine

## 2019-06-29 ENCOUNTER — Other Ambulatory Visit: Payer: Self-pay

## 2019-06-29 ENCOUNTER — Inpatient Hospital Stay (HOSPITAL_COMMUNITY): Payer: Medicaid Other | Admitting: Anesthesiology

## 2019-06-29 DIAGNOSIS — Z880 Allergy status to penicillin: Secondary | ICD-10-CM

## 2019-06-29 DIAGNOSIS — Z803 Family history of malignant neoplasm of breast: Secondary | ICD-10-CM

## 2019-06-29 DIAGNOSIS — Z79899 Other long term (current) drug therapy: Secondary | ICD-10-CM | POA: Diagnosis not present

## 2019-06-29 DIAGNOSIS — F17210 Nicotine dependence, cigarettes, uncomplicated: Secondary | ICD-10-CM | POA: Diagnosis present

## 2019-06-29 DIAGNOSIS — M87811 Other osteonecrosis, right shoulder: Secondary | ICD-10-CM | POA: Diagnosis present

## 2019-06-29 DIAGNOSIS — J449 Chronic obstructive pulmonary disease, unspecified: Secondary | ICD-10-CM | POA: Diagnosis present

## 2019-06-29 DIAGNOSIS — R6520 Severe sepsis without septic shock: Secondary | ICD-10-CM | POA: Diagnosis not present

## 2019-06-29 DIAGNOSIS — Z791 Long term (current) use of non-steroidal anti-inflammatories (NSAID): Secondary | ICD-10-CM | POA: Diagnosis not present

## 2019-06-29 DIAGNOSIS — M87021 Idiopathic aseptic necrosis of right humerus: Secondary | ICD-10-CM | POA: Diagnosis not present

## 2019-06-29 DIAGNOSIS — I959 Hypotension, unspecified: Secondary | ICD-10-CM | POA: Diagnosis present

## 2019-06-29 DIAGNOSIS — R5082 Postprocedural fever: Secondary | ICD-10-CM | POA: Diagnosis not present

## 2019-06-29 DIAGNOSIS — I2510 Atherosclerotic heart disease of native coronary artery without angina pectoris: Secondary | ICD-10-CM | POA: Diagnosis present

## 2019-06-29 DIAGNOSIS — D6959 Other secondary thrombocytopenia: Secondary | ICD-10-CM | POA: Diagnosis present

## 2019-06-29 DIAGNOSIS — A419 Sepsis, unspecified organism: Secondary | ICD-10-CM | POA: Diagnosis not present

## 2019-06-29 DIAGNOSIS — Y95 Nosocomial condition: Secondary | ICD-10-CM | POA: Diagnosis not present

## 2019-06-29 DIAGNOSIS — I509 Heart failure, unspecified: Secondary | ICD-10-CM | POA: Diagnosis present

## 2019-06-29 DIAGNOSIS — G8929 Other chronic pain: Secondary | ICD-10-CM | POA: Diagnosis present

## 2019-06-29 DIAGNOSIS — N179 Acute kidney failure, unspecified: Secondary | ICD-10-CM | POA: Diagnosis present

## 2019-06-29 DIAGNOSIS — Z20828 Contact with and (suspected) exposure to other viral communicable diseases: Secondary | ICD-10-CM | POA: Diagnosis present

## 2019-06-29 DIAGNOSIS — D696 Thrombocytopenia, unspecified: Secondary | ICD-10-CM | POA: Diagnosis present

## 2019-06-29 DIAGNOSIS — M87029 Idiopathic aseptic necrosis of unspecified humerus: Secondary | ICD-10-CM | POA: Diagnosis present

## 2019-06-29 DIAGNOSIS — R0602 Shortness of breath: Secondary | ICD-10-CM

## 2019-06-29 DIAGNOSIS — G510 Bell's palsy: Secondary | ICD-10-CM | POA: Diagnosis present

## 2019-06-29 DIAGNOSIS — D573 Sickle-cell trait: Secondary | ICD-10-CM | POA: Diagnosis present

## 2019-06-29 DIAGNOSIS — J9601 Acute respiratory failure with hypoxia: Secondary | ICD-10-CM | POA: Diagnosis present

## 2019-06-29 DIAGNOSIS — J189 Pneumonia, unspecified organism: Secondary | ICD-10-CM | POA: Diagnosis not present

## 2019-06-29 DIAGNOSIS — N390 Urinary tract infection, site not specified: Secondary | ICD-10-CM | POA: Diagnosis present

## 2019-06-29 DIAGNOSIS — R000 Tachycardia, unspecified: Secondary | ICD-10-CM | POA: Diagnosis present

## 2019-06-29 DIAGNOSIS — M879 Osteonecrosis, unspecified: Secondary | ICD-10-CM | POA: Diagnosis present

## 2019-06-29 DIAGNOSIS — Z833 Family history of diabetes mellitus: Secondary | ICD-10-CM

## 2019-06-29 DIAGNOSIS — Z96619 Presence of unspecified artificial shoulder joint: Secondary | ICD-10-CM

## 2019-06-29 DIAGNOSIS — D62 Acute posthemorrhagic anemia: Secondary | ICD-10-CM | POA: Diagnosis present

## 2019-06-29 DIAGNOSIS — J690 Pneumonitis due to inhalation of food and vomit: Secondary | ICD-10-CM | POA: Diagnosis not present

## 2019-06-29 HISTORY — DX: Idiopathic aseptic necrosis of unspecified humerus: M87.029

## 2019-06-29 HISTORY — PX: TOTAL SHOULDER ARTHROPLASTY: SHX126

## 2019-06-29 SURGERY — ARTHROPLASTY, SHOULDER, TOTAL
Anesthesia: General | Laterality: Right

## 2019-06-29 MED ORDER — MEPERIDINE HCL 50 MG/ML IJ SOLN: 6.2500 mg | INTRAMUSCULAR | Status: DC | PRN

## 2019-06-29 MED ORDER — OXYCODONE HCL 5 MG PO TABS: 5.0000 mg | ORAL_TABLET | Freq: Once | ORAL | Status: DC | PRN

## 2019-06-29 MED ORDER — GLYCOPYRROLATE 0.2 MG/ML IJ SOLN
INTRAMUSCULAR | Status: DC | PRN
  Administered 2019-06-29: 0.2 mg via INTRAVENOUS

## 2019-06-29 MED ORDER — ZOLPIDEM TARTRATE 5 MG PO TABS
5.0000 mg | ORAL_TABLET | Freq: Every evening | ORAL | Status: DC | PRN
  Administered 2019-06-29: 5 mg via ORAL
  Filled 2019-06-29: qty 1

## 2019-06-29 MED ORDER — OXYCODONE HCL 5 MG/5ML PO SOLN: 5.0000 mg | Freq: Once | ORAL | Status: DC | PRN

## 2019-06-29 MED ORDER — DOCUSATE SODIUM 100 MG PO CAPS
100.0000 mg | ORAL_CAPSULE | Freq: Two times a day (BID) | ORAL | Status: DC
  Administered 2019-06-29 – 2019-07-03 (×9): 100 mg via ORAL
  Filled 2019-06-29 (×10): qty 1

## 2019-06-29 MED ORDER — PROPOFOL 10 MG/ML IV BOLUS
INTRAVENOUS | Status: AC
  Filled 2019-06-29: qty 20

## 2019-06-29 MED ORDER — LIDOCAINE 2% (20 MG/ML) 5 ML SYRINGE
INTRAMUSCULAR | Status: AC
  Filled 2019-06-29: qty 5

## 2019-06-29 MED ORDER — MAGNESIUM CITRATE PO SOLN
1.0000 | Freq: Once | ORAL | Status: DC | PRN
  Filled 2019-06-29: qty 296

## 2019-06-29 MED ORDER — POLYETHYLENE GLYCOL 3350 17 G PO PACK: 17.0000 g | PACK | Freq: Every day | ORAL | Status: DC | PRN

## 2019-06-29 MED ORDER — VANCOMYCIN HCL IN DEXTROSE 1-5 GM/200ML-% IV SOLN
INTRAVENOUS | Status: AC
  Administered 2019-06-29: 1000 mg via INTRAVENOUS
  Filled 2019-06-29: qty 200

## 2019-06-29 MED ORDER — SODIUM CHLORIDE 0.9 % IR SOLN
Status: DC | PRN
  Administered 2019-06-29: 1000 mL

## 2019-06-29 MED ORDER — ROCURONIUM BROMIDE 100 MG/10ML IV SOLN
INTRAVENOUS | Status: DC | PRN
  Administered 2019-06-29: 60 mg via INTRAVENOUS

## 2019-06-29 MED ORDER — ACETAMINOPHEN 500 MG PO TABS
1000.0000 mg | ORAL_TABLET | Freq: Once | ORAL | Status: AC
  Administered 2019-06-29: 11:00:00 1000 mg via ORAL
  Filled 2019-06-29: qty 2

## 2019-06-29 MED ORDER — BISACODYL 10 MG RE SUPP: 10.0000 mg | Freq: Every day | RECTAL | Status: DC | PRN

## 2019-06-29 MED ORDER — SENNA-DOCUSATE SODIUM 8.6-50 MG PO TABS: 2.0000 | ORAL_TABLET | Freq: Every day | ORAL | 1 refills | Status: DC

## 2019-06-29 MED ORDER — OXYCODONE HCL 5 MG PO TABS
10.0000 mg | ORAL_TABLET | ORAL | Status: DC | PRN
  Administered 2019-07-02 – 2019-07-03 (×4): 10 mg via ORAL
  Filled 2019-06-29 (×3): qty 2

## 2019-06-29 MED ORDER — KETOROLAC TROMETHAMINE 15 MG/ML IJ SOLN
7.5000 mg | Freq: Four times a day (QID) | INTRAMUSCULAR | Status: AC
  Administered 2019-06-29 – 2019-06-30 (×3): 7.5 mg via INTRAVENOUS
  Filled 2019-06-29 (×3): qty 1

## 2019-06-29 MED ORDER — PHENOL 1.4 % MT LIQD: 1.0000 | OROMUCOSAL | Status: DC | PRN

## 2019-06-29 MED ORDER — SODIUM CHLORIDE 0.9 % IV SOLN
INTRAVENOUS | Status: DC | PRN
  Administered 2019-06-29: 15:00:00 50 ug/min via INTRAVENOUS

## 2019-06-29 MED ORDER — CHLORHEXIDINE GLUCONATE 4 % EX LIQD: 60.0000 mL | Freq: Once | CUTANEOUS | Status: DC

## 2019-06-29 MED ORDER — PHENYLEPHRINE HCL (PRESSORS) 10 MG/ML IV SOLN
INTRAVENOUS | Status: DC | PRN
  Administered 2019-06-29: 80 ug via INTRAVENOUS
  Administered 2019-06-29 (×2): 40 ug via INTRAVENOUS
  Administered 2019-06-29: 80 ug via INTRAVENOUS

## 2019-06-29 MED ORDER — CEFAZOLIN SODIUM-DEXTROSE 2-4 GM/100ML-% IV SOLN: 2.0000 g | INTRAVENOUS | Status: DC

## 2019-06-29 MED ORDER — GLYCOPYRROLATE PF 0.2 MG/ML IJ SOSY
PREFILLED_SYRINGE | INTRAMUSCULAR | Status: AC
  Filled 2019-06-29: qty 1

## 2019-06-29 MED ORDER — OXYCODONE HCL 5 MG PO TABS
5.0000 mg | ORAL_TABLET | ORAL | Status: DC | PRN
  Administered 2019-06-30 – 2019-07-02 (×5): 5 mg via ORAL
  Administered 2019-07-03 – 2019-07-04 (×2): 10 mg via ORAL
  Filled 2019-06-29: qty 2
  Filled 2019-06-29 (×3): qty 1
  Filled 2019-06-29: qty 2
  Filled 2019-06-29: qty 1
  Filled 2019-06-29: qty 2
  Filled 2019-06-29: qty 1

## 2019-06-29 MED ORDER — PROPOFOL 10 MG/ML IV BOLUS
INTRAVENOUS | Status: DC | PRN
  Administered 2019-06-29: 160 mg via INTRAVENOUS

## 2019-06-29 MED ORDER — BUPIVACAINE HCL (PF) 0.5 % IJ SOLN
INTRAMUSCULAR | Status: DC | PRN
  Administered 2019-06-29: 20 mL via PERINEURAL

## 2019-06-29 MED ORDER — ARIPIPRAZOLE 10 MG PO TABS
10.0000 mg | ORAL_TABLET | Freq: Every day | ORAL | Status: DC
  Administered 2019-06-29 – 2019-07-03 (×5): 10 mg via ORAL
  Filled 2019-06-29 (×6): qty 1

## 2019-06-29 MED ORDER — ONDANSETRON HCL 4 MG/2ML IJ SOLN: 4.0000 mg | Freq: Four times a day (QID) | INTRAMUSCULAR | Status: DC | PRN

## 2019-06-29 MED ORDER — VANCOMYCIN HCL IN DEXTROSE 1-5 GM/200ML-% IV SOLN
1000.0000 mg | Freq: Two times a day (BID) | INTRAVENOUS | Status: AC
  Administered 2019-06-30: 1000 mg via INTRAVENOUS
  Filled 2019-06-29: qty 200

## 2019-06-29 MED ORDER — MIDAZOLAM HCL 2 MG/2ML IJ SOLN
INTRAMUSCULAR | Status: AC
  Filled 2019-06-29: qty 2

## 2019-06-29 MED ORDER — FENTANYL CITRATE (PF) 100 MCG/2ML IJ SOLN
INTRAMUSCULAR | Status: DC | PRN
  Administered 2019-06-29 (×2): 25 ug via INTRAVENOUS
  Administered 2019-06-29: 50 ug via INTRAVENOUS

## 2019-06-29 MED ORDER — MIDAZOLAM HCL 2 MG/2ML IJ SOLN
1.0000 mg | INTRAMUSCULAR | Status: DC
  Administered 2019-06-29 (×2): 1 mg via INTRAVENOUS
  Administered 2019-06-29: 2 mg via INTRAVENOUS
  Filled 2019-06-29: qty 2

## 2019-06-29 MED ORDER — OXYCODONE HCL 5 MG PO TABS: 5.0000 mg | ORAL_TABLET | ORAL | 0 refills | Status: DC | PRN

## 2019-06-29 MED ORDER — DEXAMETHASONE SODIUM PHOSPHATE 10 MG/ML IJ SOLN
INTRAMUSCULAR | Status: AC
  Filled 2019-06-29: qty 1

## 2019-06-29 MED ORDER — LACTATED RINGERS IV SOLN
INTRAVENOUS | Status: DC
  Administered 2019-06-29: 11:00:00 via INTRAVENOUS

## 2019-06-29 MED ORDER — ACETAMINOPHEN 500 MG PO TABS
1000.0000 mg | ORAL_TABLET | Freq: Four times a day (QID) | ORAL | Status: AC
  Administered 2019-06-30 (×2): 1000 mg via ORAL
  Filled 2019-06-29 (×2): qty 2

## 2019-06-29 MED ORDER — ACETAMINOPHEN 325 MG PO TABS
325.0000 mg | ORAL_TABLET | Freq: Four times a day (QID) | ORAL | Status: DC | PRN
  Administered 2019-06-30 – 2019-07-03 (×7): 650 mg via ORAL
  Filled 2019-06-29 (×7): qty 2

## 2019-06-29 MED ORDER — ONDANSETRON HCL 4 MG PO TABS
4.0000 mg | ORAL_TABLET | Freq: Four times a day (QID) | ORAL | Status: DC | PRN
  Administered 2019-06-29: 4 mg via ORAL
  Filled 2019-06-29: qty 1

## 2019-06-29 MED ORDER — HYDROMORPHONE HCL 1 MG/ML IJ SOLN
0.2500 mg | INTRAMUSCULAR | Status: DC | PRN
  Administered 2019-06-29 (×2): 0.5 mg via INTRAVENOUS

## 2019-06-29 MED ORDER — LIDOCAINE HCL (CARDIAC) PF 100 MG/5ML IV SOSY
PREFILLED_SYRINGE | INTRAVENOUS | Status: DC | PRN
  Administered 2019-06-29: 40 mg via INTRAVENOUS

## 2019-06-29 MED ORDER — METHOCARBAMOL 500 MG PO TABS
500.0000 mg | ORAL_TABLET | Freq: Four times a day (QID) | ORAL | Status: DC | PRN
  Administered 2019-06-30 – 2019-07-04 (×3): 500 mg via ORAL
  Filled 2019-06-29 (×3): qty 1

## 2019-06-29 MED ORDER — ONDANSETRON HCL 4 MG PO TABS: 4.0000 mg | ORAL_TABLET | Freq: Three times a day (TID) | ORAL | 0 refills | Status: DC | PRN

## 2019-06-29 MED ORDER — ROCURONIUM BROMIDE 10 MG/ML (PF) SYRINGE
PREFILLED_SYRINGE | INTRAVENOUS | Status: AC
  Filled 2019-06-29: qty 10

## 2019-06-29 MED ORDER — EPHEDRINE 5 MG/ML INJ
INTRAVENOUS | Status: AC
  Filled 2019-06-29: qty 10

## 2019-06-29 MED ORDER — MUPIROCIN 2 % EX OINT
1.0000 "application " | TOPICAL_OINTMENT | Freq: Three times a day (TID) | CUTANEOUS | Status: DC
  Administered 2019-06-29 – 2019-07-03 (×8): 1 via TOPICAL
  Filled 2019-06-29 (×2): qty 22

## 2019-06-29 MED ORDER — FENTANYL CITRATE (PF) 100 MCG/2ML IJ SOLN
INTRAMUSCULAR | Status: AC
  Filled 2019-06-29: qty 2

## 2019-06-29 MED ORDER — BACLOFEN 10 MG PO TABS: 10.0000 mg | ORAL_TABLET | Freq: Three times a day (TID) | ORAL | 0 refills | Status: DC

## 2019-06-29 MED ORDER — ALUM & MAG HYDROXIDE-SIMETH 200-200-20 MG/5ML PO SUSP: 30.0000 mL | ORAL | Status: DC | PRN

## 2019-06-29 MED ORDER — SUGAMMADEX SODIUM 200 MG/2ML IV SOLN
INTRAVENOUS | Status: DC | PRN
  Administered 2019-06-29: 175 mg via INTRAVENOUS

## 2019-06-29 MED ORDER — DEXAMETHASONE SODIUM PHOSPHATE 10 MG/ML IJ SOLN
INTRAMUSCULAR | Status: DC | PRN
  Administered 2019-06-29: 10 mg via INTRAVENOUS

## 2019-06-29 MED ORDER — METOCLOPRAMIDE HCL 5 MG PO TABS: 5.0000 mg | ORAL_TABLET | Freq: Three times a day (TID) | ORAL | Status: DC | PRN

## 2019-06-29 MED ORDER — POTASSIUM CHLORIDE IN NACL 20-0.45 MEQ/L-% IV SOLN
INTRAVENOUS | Status: DC
  Administered 2019-06-29: 19:00:00 via INTRAVENOUS
  Filled 2019-06-29 (×2): qty 1000

## 2019-06-29 MED ORDER — TRANEXAMIC ACID-NACL 1000-0.7 MG/100ML-% IV SOLN
1000.0000 mg | INTRAVENOUS | Status: AC
  Administered 2019-06-29: 14:00:00 1000 mg via INTRAVENOUS
  Filled 2019-06-29: qty 100

## 2019-06-29 MED ORDER — EPHEDRINE SULFATE 50 MG/ML IJ SOLN
INTRAMUSCULAR | Status: DC | PRN
  Administered 2019-06-29: 5 mg via INTRAVENOUS

## 2019-06-29 MED ORDER — METHOCARBAMOL 500 MG IVPB - SIMPLE MED
INTRAVENOUS | Status: AC
  Administered 2019-06-29: 18:00:00 500 mg via INTRAVENOUS
  Filled 2019-06-29: qty 50

## 2019-06-29 MED ORDER — FENTANYL CITRATE (PF) 100 MCG/2ML IJ SOLN
50.0000 ug | INTRAMUSCULAR | Status: DC
  Administered 2019-06-29: 100 ug via INTRAVENOUS
  Filled 2019-06-29: qty 2

## 2019-06-29 MED ORDER — DIPHENHYDRAMINE HCL 12.5 MG/5ML PO ELIX: 12.5000 mg | ORAL_SOLUTION | ORAL | Status: DC | PRN

## 2019-06-29 MED ORDER — HYDROMORPHONE HCL 1 MG/ML IJ SOLN
INTRAMUSCULAR | Status: AC
  Administered 2019-06-29: 0.5 mg via INTRAVENOUS
  Filled 2019-06-29: qty 1

## 2019-06-29 MED ORDER — VANCOMYCIN HCL IN DEXTROSE 1-5 GM/200ML-% IV SOLN
1000.0000 mg | Freq: Once | INTRAVENOUS | Status: AC
  Administered 2019-06-29: 11:00:00 1000 mg via INTRAVENOUS

## 2019-06-29 MED ORDER — MENTHOL 3 MG MT LOZG
1.0000 | LOZENGE | OROMUCOSAL | Status: DC | PRN
  Administered 2019-06-30: 3 mg via ORAL
  Filled 2019-06-29: qty 9

## 2019-06-29 MED ORDER — BUPIVACAINE LIPOSOME 1.3 % IJ SUSP
INTRAMUSCULAR | Status: DC | PRN
  Administered 2019-06-29: 10 mL via PERINEURAL

## 2019-06-29 MED ORDER — PROMETHAZINE HCL 25 MG/ML IJ SOLN: 6.2500 mg | INTRAMUSCULAR | Status: DC | PRN

## 2019-06-29 MED ORDER — HYDROMORPHONE HCL 1 MG/ML IJ SOLN
0.5000 mg | INTRAMUSCULAR | Status: DC | PRN
  Administered 2019-07-03 (×2): 1 mg via INTRAVENOUS
  Filled 2019-06-29 (×2): qty 1

## 2019-06-29 MED ORDER — ARTIFICIAL TEARS OPHTHALMIC OINT
TOPICAL_OINTMENT | OPHTHALMIC | Status: AC
  Filled 2019-06-29: qty 3.5

## 2019-06-29 MED ORDER — METOCLOPRAMIDE HCL 5 MG/ML IJ SOLN
5.0000 mg | Freq: Three times a day (TID) | INTRAMUSCULAR | Status: DC | PRN
  Administered 2019-06-29: 19:00:00 10 mg via INTRAVENOUS
  Filled 2019-06-29: qty 2

## 2019-06-29 MED ORDER — METHOCARBAMOL 500 MG IVPB - SIMPLE MED
500.0000 mg | Freq: Four times a day (QID) | INTRAVENOUS | Status: DC | PRN
  Administered 2019-06-29: 18:00:00 500 mg via INTRAVENOUS
  Filled 2019-06-29: qty 50

## 2019-06-29 MED ORDER — ONDANSETRON HCL 4 MG/2ML IJ SOLN
INTRAMUSCULAR | Status: DC | PRN
  Administered 2019-06-29: 4 mg via INTRAVENOUS

## 2019-06-29 MED ORDER — ONDANSETRON HCL 4 MG/2ML IJ SOLN
INTRAMUSCULAR | Status: AC
  Filled 2019-06-29: qty 2

## 2019-06-29 SURGICAL SUPPLY — 58 items
BAG ZIPLOCK 12X15 (MISCELLANEOUS) ×3 IMPLANT
BLADE SAW SGTL 18X1.27X75 (BLADE) ×2 IMPLANT
BLADE SAW SGTL 18X1.27X75MM (BLADE) ×1
CLOSURE STERI-STRIP 1/2X4 (GAUZE/BANDAGES/DRESSINGS) ×1
CLOSURE WOUND 1/2 X4 (GAUZE/BANDAGES/DRESSINGS) ×1
CLSR STERI-STRIP ANTIMIC 1/2X4 (GAUZE/BANDAGES/DRESSINGS) ×2 IMPLANT
COVER BACK TABLE 60X90IN (DRAPES) ×3 IMPLANT
COVER MAYO STAND STRL (DRAPES) ×3 IMPLANT
COVER SURGICAL LIGHT HANDLE (MISCELLANEOUS) ×3 IMPLANT
COVER WAND RF STERILE (DRAPES) IMPLANT
DECANTER SPIKE VIAL GLASS SM (MISCELLANEOUS) IMPLANT
DRAPE POUCH INSTRU U-SHP 10X18 (DRAPES) ×3 IMPLANT
DRAPE SHEET LG 3/4 BI-LAMINATE (DRAPES) ×3 IMPLANT
DRAPE SURG 17X11 SM STRL (DRAPES) ×3 IMPLANT
DRAPE U-SHAPE 47X51 STRL (DRAPES) ×3 IMPLANT
DRSG MEPILEX BORDER 4X8 (GAUZE/BANDAGES/DRESSINGS) ×3 IMPLANT
DURAPREP 26ML APPLICATOR (WOUND CARE) ×3 IMPLANT
ELECT REM PT RETURN 15FT ADLT (MISCELLANEOUS) ×3 IMPLANT
GLOVE BIO SURGEON STRL SZ7.5 (GLOVE) ×3 IMPLANT
GLOVE BIO SURGEON STRL SZ8 (GLOVE) ×3 IMPLANT
GLOVE BIOGEL PI IND STRL 8 (GLOVE) ×2 IMPLANT
GLOVE BIOGEL PI INDICATOR 8 (GLOVE) ×4
GOWN STRL REUS W/TWL 2XL LVL3 (GOWN DISPOSABLE) ×3 IMPLANT
GOWN STRL REUS W/TWL LRG LVL3 (GOWN DISPOSABLE) ×3 IMPLANT
HANDPIECE INTERPULSE COAX TIP (DISPOSABLE) ×2
HEAD HUMERAL BIPOLAR 38X19X39 (Miscellaneous) IMPLANT
HEAD HUMERAL COMP STD (Orthopedic Implant) IMPLANT
HOOD PEEL AWAY FLYTE STAYCOOL (MISCELLANEOUS) ×11 IMPLANT
HUMERAL HEAD BIPOLAR 38X19X39 (Miscellaneous) ×3 IMPLANT
HUMERAL HEAD COMP STD (Orthopedic Implant) ×3 IMPLANT
KIT BASIN OR (CUSTOM PROCEDURE TRAY) ×3 IMPLANT
KIT TURNOVER KIT A (KITS) IMPLANT
NS IRRIG 1000ML POUR BTL (IV SOLUTION) ×3 IMPLANT
PACK SHOULDER (CUSTOM PROCEDURE TRAY) ×3 IMPLANT
PAD COLD SHLDR WRAP-ON (PAD) ×2 IMPLANT
PIN STEINMANN THREADED TIP (PIN) ×2 IMPLANT
PROTECTOR NERVE ULNAR (MISCELLANEOUS) ×3 IMPLANT
RESTRAINT HEAD UNIVERSAL NS (MISCELLANEOUS) IMPLANT
SET HNDPC FAN SPRY TIP SCT (DISPOSABLE) ×1 IMPLANT
SLING ARM IMMOBILIZER LRG (SOFTGOODS) ×3 IMPLANT
SLING ARM IMMOBILIZER MED (SOFTGOODS) ×2 IMPLANT
SMARTMIX MINI TOWER (MISCELLANEOUS)
STEM HUMERAL STRL 8MMX55MM (Stem) ×2 IMPLANT
STRIP CLOSURE SKIN 1/2X4 (GAUZE/BANDAGES/DRESSINGS) ×1 IMPLANT
SUCTION FRAZIER HANDLE 12FR (TUBING) ×2
SUCTION TUBE FRAZIER 12FR DISP (TUBING) ×1 IMPLANT
SUPPORT WRAP ARM LG (MISCELLANEOUS) ×3 IMPLANT
SUT FIBERWIRE #2 38 REV NDL BL (SUTURE) ×12
SUT VIC AB 1 CT1 36 (SUTURE) ×3 IMPLANT
SUT VIC AB 2-0 CT1 27 (SUTURE) ×2
SUT VIC AB 2-0 CT1 TAPERPNT 27 (SUTURE) ×1 IMPLANT
SUT VIC AB 3-0 SH 8-18 (SUTURE) ×3 IMPLANT
SUTURE FIBERWR#2 38 REV NDL BL (SUTURE) ×4 IMPLANT
TOWEL OR 17X26 10 PK STRL BLUE (TOWEL DISPOSABLE) ×3 IMPLANT
TOWEL OR NON WOVEN STRL DISP B (DISPOSABLE) ×3 IMPLANT
TOWER SMARTMIX MINI (MISCELLANEOUS) IMPLANT
WATER STERILE IRR 1000ML POUR (IV SOLUTION) ×6 IMPLANT
YANKAUER SUCT BULB TIP 10FT TU (MISCELLANEOUS) ×3 IMPLANT

## 2019-06-29 NOTE — Progress Notes (Signed)
Assisted Dr. Miller with left, ultrasound guided, interscalene  block. Side rails up, monitors on throughout procedure. See vital signs in flow sheet. Tolerated Procedure well.  

## 2019-06-29 NOTE — Anesthesia Postprocedure Evaluation (Signed)
Anesthesia Post Note  Patient: Tina Mayo  Procedure(s) Performed: TOTAL SHOULDER ARTHROPLASTY (Right )     Patient location during evaluation: PACU Anesthesia Type: General Level of consciousness: awake and alert Pain management: pain level controlled Vital Signs Assessment: post-procedure vital signs reviewed and stable Respiratory status: spontaneous breathing, nonlabored ventilation, respiratory function stable and patient connected to nasal cannula oxygen Cardiovascular status: blood pressure returned to baseline and stable Postop Assessment: no apparent nausea or vomiting Anesthetic complications: no    Last Vitals:  Vitals:   06/29/19 1645 06/29/19 1700  BP: 101/63 (!) 99/58  Pulse: 95 80  Resp: 18 19  Temp:    SpO2: 97% 98%    Last Pain:  Vitals:   06/29/19 1700  TempSrc:   PainSc: 0-No pain                 Tekeisha Hakim S

## 2019-06-29 NOTE — Anesthesia Procedure Notes (Signed)
Anesthesia Regional Block: Interscalene brachial plexus block   Pre-Anesthetic Checklist: ,, timeout performed, Correct Patient, Correct Site, Correct Laterality, Correct Procedure, Correct Position, site marked, Risks and benefits discussed,  Surgical consent,  Pre-op evaluation,  At surgeon's request and post-op pain management  Laterality: Right  Prep: chloraprep       Needles:  Injection technique: Single-shot  Needle Type: Stimiplex     Needle Length: 9cm  Needle Gauge: 21     Additional Needles:   Procedures:,,,, ultrasound used (permanent image in chart),,,,  Narrative:  Start time: 06/29/2019 11:15 AM End time: 06/29/2019 11:20 AM Injection made incrementally with aspirations every 5 mL.  Performed by: Personally  Anesthesiologist: Lynda Rainwater, MD

## 2019-06-29 NOTE — Anesthesia Procedure Notes (Signed)
Procedure Name: Intubation Date/Time: 06/29/2019 2:04 PM Performed by: Garrel Ridgel, CRNA Pre-anesthesia Checklist: Patient identified, Emergency Drugs available, Patient being monitored, Suction available and Timeout performed Patient Re-evaluated:Patient Re-evaluated prior to induction Oxygen Delivery Method: Circle system utilized Preoxygenation: Pre-oxygenation with 100% oxygen Induction Type: IV induction Ventilation: Mask ventilation without difficulty Laryngoscope Size: Mac and 3 Grade View: Grade I Tube type: Oral Tube size: 7.0 mm Number of attempts: 1 Airway Equipment and Method: Stylet Placement Confirmation: ETT inserted through vocal cords under direct vision,  positive ETCO2 and breath sounds checked- equal and bilateral Secured at: 22 cm Tube secured with: Tape Dental Injury: Teeth and Oropharynx as per pre-operative assessment

## 2019-06-29 NOTE — Transfer of Care (Signed)
Immediate Anesthesia Transfer of Care Note  Patient: Tina Mayo  Procedure(s) Performed: Procedure(s): TOTAL SHOULDER ARTHROPLASTY (Right)  Patient Location: PACU  Anesthesia Type:General and Regional  Level of Consciousness:  sedated, patient cooperative and responds to stimulation  Airway & Oxygen Therapy:Patient Spontanous Breathing and Patient connected to face mask oxgen  Post-op Assessment:  Report given to PACU RN and Post -op Vital signs reviewed and stable  Post vital signs:  Reviewed and stable  Last Vitals:  Vitals:   06/29/19 1321 06/29/19 1322  BP:    Pulse: 76 73  Resp: (!) 21 20  Temp:    SpO2: 903% 014%    Complications: No apparent anesthesia complications

## 2019-06-29 NOTE — Discharge Instructions (Signed)

## 2019-06-29 NOTE — H&P (Signed)
PREOPERATIVE H&P  Chief Complaint: Right shoulder pain  HPI: Tina Mayo is a 48 y.o. female who presents for preoperative history and physical with a diagnosis of right shoulder avascular necrosis. Symptoms are rated as moderate to severe, and have been worsening.  This is significantly impairing activities of daily living.  She has elected for surgical management.  She has sickle cell trait as well as asthma.  Pain is rated as 10/10 interfering with sleep, worse with movement, diffuse around the shoulder.  She has failed injections, activity modification, anti-inflammatories, and assistive devices.  Preoperative MRI demonstrates avascular necrosis of the humeral head   Past Medical History:  Diagnosis Date  . Anxiety   . Asthma   . Bell's palsy   . Depression   . Sickle cell trait [facility])    Past Surgical History:  Procedure Laterality Date  . benign breast tumor removed     2006  . BREAST BIOPSY Right 2014   benign  . BREAST EXCISIONAL BIOPSY Right 2007   benign   . CHOLECYSTECTOMY N/A 12/16/2018   Procedure: LAPAROSCOPIC CHOLECYSTECTOMY;  Surgeon: Clovis Riley, MD;  Location: [place];  Service: General;  Laterality: N/A;  . right ankle surgery     2010  . TUBAL LIGATION     Social History   Socioeconomic History  . Marital status: Single    Spouse name: Not on file  . Number of children: Not on file  . Years of education: Not on file  . Highest education level: Not on file  Occupational History  . Not on file  Social Needs  . Financial resource strain: Not on file  . Food insecurity    Worry: Not on file    Inability: Not on file  . Transportation needs    Medical: Not on file    Non-medical: Not on file  Tobacco Use  . Smoking status: Current Every Day Smoker    Packs/day: 0.10    Types: Cigarettes  . Smokeless tobacco: Current User  Substance and Sexual Activity  . Alcohol use: No  . Drug use: No  . Sexual activity: Not on file  Lifestyle   . Physical activity    Days per week: Not on file    Minutes per session: Not on file  . Stress: Not on file  Relationships  . Social Herbalist on phone: Not on file    Gets together: Not on file    Attends religious service: Not on file    Active member of club or organization: Not on file    Attends meetings of clubs or organizations: Not on file    Relationship status: Not on file  Other Topics Concern  . Not on file  Social History Narrative  . Not on file   Family History  Problem Relation Age of Onset  . Breast cancer Mother   . Cancer Mother   . Diabetes Mellitus II Father   . Diabetes Father   . Breast cancer Maternal Grandmother    Allergies  Allergen Reactions  . Levaquin [Levofloxacin In D5w] Anaphylaxis  . Penicillins Swelling    DID THE REACTION INVOLVE: Swelling of the face/tongue/throat, SOB, or low BP? Yes Sudden or severe rash/hives, skin peeling, or the inside of the mouth or nose? No Did it require medical treatment? Yes When did it last happen?8 years ago If all above answers are "NO", may proceed with cephalosporin use.    Prior to  Admission medications   Medication Sig Start Date End Date Taking? Authorizing Provider  ARIPiprazole (ABILIFY) 10 MG tablet Take 10 mg by mouth at bedtime.  01/23/17  Yes [provider]  diclofenac sodium (VOLTAREN) 1 % GEL Apply 2 g topically 4 (four) times daily as needed (pain).   Yes [provider]  ibuprofen (ADVIL) 800 MG tablet Take 800 mg by mouth every 8 (eight) hours as needed for moderate pain.   Yes [provider]  Lidocaine 4 % PTCH Place 1 patch onto the skin daily as needed (pain).   Yes [provider]  meloxicam (MOBIC) 15 MG tablet Take 15 mg by mouth daily.   Yes [provider]  Menthol-Methyl Salicylate (MUSCLE RUB) 10-15 % CREA Apply 1 application topically as needed for muscle pain.   Yes [provider]  OVER THE COUNTER  MEDICATION Take 700 mg by mouth 3 (three) times daily as needed (pain). CBD Gummies 350 mg per gummy   Yes [provider]  sertraline (ZOLOFT) 50 MG tablet Take 50 mg by mouth at bedtime.  01/23/17  Yes [provider]  mupirocin ointment (BACTROBAN) 2 % Apply 1 application topically 3 (three) times daily. Patient not taking: Reported on 06/11/2019 02/24/19   Melynda Ripple, MD  senna-docusate (SENOKOT-S) 8.6-50 MG tablet Take 1 tablet by mouth 2 (two) times daily as needed for mild constipation. Patient not taking: Reported on 06/11/2019 12/17/18 12/17/19  Dhungel, Flonnie Overman, MD  triamcinolone cream (KENALOG) 0.1 % Apply 1 application topically 2 (two) times daily. Apply for 2 weeks as needed for itching Patient not taking: Reported on 06/11/2019 02/24/19   Melynda Ripple, MD     Positive ROS: All other systems have been reviewed and were otherwise negative with the exception of those mentioned in the HPI and as above.  Physical Exam: General: Alert, no acute distress Cardiovascular: No pedal edema Respiratory: No cyanosis, no use of accessory musculature GI: No organomegaly, abdomen is soft and non-tender Skin: No lesions in the area of chief complaint Neurologic: Sensation intact distally Psychiatric: Patient is competent for consent with normal mood and affect Lymphatic: No axillary or cervical lymphadenopathy  MUSCULOSKELETAL: Right shoulder active motion 0 to 165 degrees with external rotation to 20 degrees with a painful arc of motion, very slight crepitance.  Assessment: Right shoulder avascular necrosis, coexisting sickle cell trait   Plan: Plan for Procedure(s): TOTAL SHOULDER ARTHROPLASTY versus hemiarthroplasty depending on the findings of the glenoid  The risks benefits and alternatives were discussed with the patient including but not limited to the risks of nonoperative treatment, versus surgical intervention including infection, bleeding, nerve injury,  subscapularis rupture, the future need for revision arthroplasty, blood clots, cardiopulmonary complications, morbidity, mortality, among others, and they were willing to proceed.    Patient's anticipated LOS is less than 2 midnights, meeting these requirements: - Younger than 73 - Lives within 1 hour of care - Has a competent adult at home to recover with post-op recover - NO history of  - Chronic pain requiring opiods  - Diabetes  - Coronary Artery Disease  - Heart failure  - Heart attack  - Stroke  - DVT/VTE  - Cardiac arrhythmia  - Respiratory Failure/COPD  - Renal failure  - Anemia  - Advanced Liver disease        Johnny Bridge, MD Cell [phone]   06/29/2019 10:13 AM

## 2019-06-29 NOTE — Op Note (Signed)
06/29/2019  4:18 PM  PATIENT:  Tina Mayo    PRE-OPERATIVE DIAGNOSIS: Right shoulder avascular necrosis  POST-OPERATIVE DIAGNOSIS:  Same  PROCEDURE: Right shoulder hemiarthroplasty  SURGEON:  Johnny Bridge, MD  PHYSICIAN ASSISTANT: Joya Gaskins, OPA-C, present and scrubbed throughout the case, critical for completion in a timely fashion, and for retraction, instrumentation, and closure.  ANESTHESIA:   General with an interscalene block  ESTIMATED BLOOD LOSS: 200 mL  UNIQUE ASPECTS OF THE CASE: The head was crumbling, with a mixed sclerotic appearance that was extremely dense within the bone.  The articular cartilage itself was delaminating.  I had extreme difficulty getting the real prosthesis down, in fact I removed it twice, and sequentially broached up to a size 10 in order to achieve full seating of the size 8 prosthesis.  I ended up using the 8, even though I was able to ream to a size 10, but the metaphyseal dense cortical bone had excellent fixation, even too much fixation.  PREOPERATIVE INDICATIONS:  Tina Mayo is a  48 y.o. female who failed conservative measures and elected for surgical management.    The risks benefits and alternatives were discussed with the patient preoperatively including but not limited to the risks of infection, bleeding, nerve injury, cardiopulmonary complications, the need for revision surgery, dislocation, loosening, incomplete relief of pain, among others, and the patient was willing to proceed.   OPERATIVE IMPLANTS: Biomet size 8 micro press-fit humeral stem, size 38+19 versa-dial humeral head, set in the E position with increased coverage posteriorly.  OPERATIVE FINDINGS: Advanced avascular necrosis on the humeral head.  The glenoid appeared intact.       OPERATIVE PROCEDURE: The patient was brought to the operating room and placed in the supine position. General anesthesia was administered. IV antibiotics were given.   The upper extremity was prepped and draped in usual sterile fashion. The patient was in a beachchair position with all bony prominences padded.   Time out was performed and a deltopectoral approach was carried out. The biceps tendon was tenodesed to the pectoralis tendon. The subscapularis was released, tagging it with a #2 FiberWire, leaving a cuff of tendon for repair.   The inferior osteophyte was removed, and release of the capsule off of the humeral side was completed. The head was dislocated, and I reamed sequentially. I placed the humeral cutting guide at 30 of retroversion, and then pinned this into place, and made my humeral neck cut. This was at the appropriate level.   I then placed deep retractors and exposed the glenoid.  I completed the biceps resection removing it at the supraglenoid tubercle.  I inspected the glenoid, and it was found to be intact, so I went back to the humerus.  I sequentially broached, up to the selected size, initially I stopped at the size 8 because it was so tight going down, with the broach set at 30 of retroversion. I placed 3 #2 Fiberwire through the bone for subsequent repair.  I then placed the real stem. I trialed with multiple heads, and the above-named component was selected. Increased posterior coverage improved the coverage. The soft tissue tension was appropriate.  Initially however, the real prosthesis did not fully seat to my satisfaction, so I removed it, and then broached with a 9, attempted to replace the prosthesis, it was still too tall, and then I went and broached with a 10, and then I was able to get it fully seated, with satisfactory restoration of  soft tissue tension.  I then impacted the real humeral head into place, reduced the head, and irrigated copiously. Excellent stability and range of motion was achieved. I repaired the subscapularis with a total of 5 #2 FiberWire; one for the interval, one for the corner, and then the remaining three  from the lesser tuberosity which had already been passed.  Excellent repair achieved and I irrigated copiously once more. The subcutaneous tissue was closed with Vicryl including the deltopectoral fascia.   The skin was closed with Steri-Strips and sterile gauze was applied. She had a preoperative nerve block. She tolerated the procedure well and there were no complications.

## 2019-06-30 ENCOUNTER — Encounter (HOSPITAL_COMMUNITY): Payer: Self-pay

## 2019-06-30 LAB — BASIC METABOLIC PANEL
Anion gap: 8 (ref 5–15)
BUN: 9 mg/dL (ref 6–20)
CO2: 25 mmol/L (ref 22–32)
Calcium: 9.1 mg/dL (ref 8.9–10.3)
Chloride: 106 mmol/L (ref 98–111)
Creatinine, Ser: 0.69 mg/dL (ref 0.44–1.00)
GFR calc Af Amer: >60 mL/min (ref >60)
GFR calc non Af Amer: >60 mL/min (ref >60)
Glucose, Bld: 136 mg/dL — ABNORMAL HIGH (ref 70–99)
Potassium: 4.3 mmol/L (ref 3.5–5.1)
Sodium: 139 mmol/L (ref 135–145)

## 2019-06-30 LAB — CBC
HCT: 26 % — ABNORMAL LOW (ref 36.0–46.0)
Hemoglobin: 9.4 g/dL — ABNORMAL LOW (ref 12.0–15.0)
MCH: 30.7 pg (ref 26.0–34.0)
MCHC: 36.2 g/dL — ABNORMAL HIGH (ref 30.0–36.0)
MCV: 85 fL (ref 80.0–100.0)
Platelets: 68 10*3/uL — ABNORMAL LOW (ref 150–400)
RBC: 3.06 MIL/uL — ABNORMAL LOW (ref 3.87–5.11)
RDW: 13.6 % (ref 11.5–15.5)
WBC: 15.5 10*3/uL — ABNORMAL HIGH (ref 4.0–10.5)
nRBC: 0 % (ref 0.0–0.2)

## 2019-06-30 NOTE — Plan of Care (Signed)
  Problem: Education: Goal: Knowledge of General Education information will improve Description: Including pain rating scale, medication(s)/side effects and non-pharmacologic comfort measures Outcome: Progressing   Problem: Clinical Measurements: Goal: Will remain free from infection Outcome: Progressing Goal: Respiratory complications will improve Outcome: Progressing Goal: Cardiovascular complication will be avoided Outcome: Progressing   Problem: Coping: Goal: Level of anxiety will decrease Outcome: Progressing   Problem: Elimination: Goal: Will not experience complications related to urinary retention Outcome: Progressing   Problem: Pain Managment: Goal: General experience of comfort will improve Outcome: Progressing   Problem: Safety: Goal: Ability to remain free from injury will improve Outcome: Progressing   

## 2019-06-30 NOTE — Evaluation (Signed)
Occupational Therapy Evaluation Patient Details Name: Tina Mayo MRN: [id number] DOB: [birth year] Today's Date: 06/30/2019    History of Present Illness Pt s/p Right shoulder hemiarthroplasty   Clinical Impression   Patient is s/p R shoulder surgery resulting in functional limitations due to the deficits listed below (see OT Problem List).  Patient will benefit from skilled OT to increase their safety and independence with ADL and functional mobility for ADL to allow facilitate discharge to venue listed below.   Pt very fatigued upon OT eval stating she just wanted to sleep.  Encouraged pt to get OOB. Pt agreed. OT education provided regarding sling management as well as elbow, wrist and hand ROM    Follow Up Recommendations  Follow surgeon's recommendation for DC plan and follow-up therapies    Equipment Recommendations  None recommended by OT       Precautions / Restrictions Precautions Precautions: Shoulder Type of Shoulder Precautions: No shoulder movement allowed. Elbow, wrist and hand ROM ok Shoulder Interventions: Shoulder sling/immobilizer Required Braces or Orthoses: Sling Restrictions Weight Bearing Restrictions: Yes RUE Weight Bearing: Non weight bearing      Mobility Bed Mobility Overal bed mobility: Modified Independent                Transfers Overall transfer level: Modified independent                                Vision Patient Visual Report: No change from baseline              Pertinent Vitals/Pain Pain Assessment: 0-10 Pain Score: 2  Pain Descriptors / Indicators: Sore Pain Intervention(s): Limited activity within patient's tolerance;Monitored during session;Repositioned     Hand Dominance     Extremity/Trunk Assessment Upper Extremity Assessment Upper Extremity Assessment: RUE deficits/detail RUE Deficits / Details: No shoulder movement allowed           Communication Communication Communication: No  difficulties   Cognition Arousal/Alertness: Awake/alert Behavior During Therapy: WFL for tasks assessed/performed Overall Cognitive Status: Within Functional Limits for tasks assessed                                           Shoulder Instructions Shoulder Instructions Donning/doffing shirt without moving shoulder: Moderate assistance Method for sponge bathing under operated UE: Minimal assistance Donning/doffing sling/immobilizer: Moderate assistance Correct positioning of sling/immobilizer: Minimal assistance ROM for elbow, wrist and digits of operated UE: Moderate assistance Sling wearing schedule (on at all times/off for ADL's): Minimal assistance Proper positioning of operated UE when showering: Minimal assistance Positioning of UE while sleeping: Minimal assistance    Home Living Family/patient expects to be discharged to:: Private residence   Available Help at Discharge: Family                                    Prior Functioning/Environment Level of Independence: Independent                 OT Problem List: Decreased strength;Decreased knowledge of precautions      OT Treatment/Interventions: Self-care/ADL training;Patient/family education    OT Goals(Current goals can be found in the care plan section) Acute Rehab OT Goals Patient Stated Goal: sleep OT Goal Formulation: With patient Time For Goal Achievement:  07/07/19 Potential to Achieve Goals: Good  OT Frequency: Min 2X/week   Barriers to D/C:               AM-PAC OT "6 Clicks" Daily Activity     Outcome Measure Help from another person eating meals?: None Help from another person taking care of personal grooming?: A Little Help from another person toileting, which includes using toliet, bedpan, or urinal?: A Little Help from another person bathing (including washing, rinsing, drying)?: A Little Help from another person to put on and taking off regular upper body  clothing?: A Little Help from another person to put on and taking off regular lower body clothing?: A Little 6 Click Score: 19   End of Session Equipment Utilized During Treatment: Other (comment)(sling) Nurse Communication: Mobility status  Activity Tolerance: Patient limited by fatigue Patient left: in chair;with call bell/phone within reach  OT Visit Diagnosis: Muscle weakness (generalized) (M62.81)                Time: 8242-3536 OT Time Calculation (min): 21 min Charges:  OT General Charges $OT Visit: 1 Visit OT Evaluation $OT Eval Low Complexity: 1 Low  Kari Baars, OT Acute Rehabilitation Services [phone] Office- [phone]     Kinneth Fujiwara, Edwena Felty D 06/30/2019, 12:17 PM

## 2019-06-30 NOTE — Progress Notes (Signed)
Dr. Mardelle Matte notified of MEWS score, Temp, Pulse, O2 saturation (see flowsheet), and interventions (see MAR). RN will encourage incentive spirometer use and will continue to monitor based on MEWS guidelines.

## 2019-06-30 NOTE — Progress Notes (Signed)
     Subjective:  Patient reports pain as mild.  Complains of severe nausea ever since surgery.  Objective:   VITALS:   Vitals:   06/29/19 2045 06/29/19 2206 06/30/19 0046 06/30/19 0528  BP: 104/73 101/68 122/68 102/68  Pulse: 82 (!) 54 65 77  Resp: 16 16 16 16   Temp: 97.7 F (36.5 C) 97.6 F (36.4 C) 98.5 F (36.9 C) 98.7 F (37.1 C)  TempSrc:      SpO2: 96% 96% 97% 97%  Weight:      Height:        She still has a fairly dense block, she cannot move her fingers, she has a little bit of a facial droop on the right side with lid lag.  Lab Results  Component Value Date   WBC 15.5 (H) 06/30/2019   HGB 9.4 (L) 06/30/2019   HCT 26.0 (L) 06/30/2019   MCV 85.0 06/30/2019   PLT 68 (L) 06/30/2019   BMET    Component Value Date/Time   NA 139 06/30/2019 0327   NA 140 12/24/2013 1421   K 4.3 06/30/2019 0327   K 3.8 12/24/2013 1421   CL 106 06/30/2019 0327   CO2 25 06/30/2019 0327   CO2 25 12/24/2013 1421   GLUCOSE 136 (H) 06/30/2019 0327   GLUCOSE 88 12/24/2013 1421   BUN 9 06/30/2019 0327   BUN 5.8 (L) 12/24/2013 1421   CREATININE 0.69 06/30/2019 0327   CREATININE 0.78 04/25/2015 1108   CREATININE 0.8 12/24/2013 1421   CALCIUM 9.1 06/30/2019 0327   CALCIUM 8.8 12/24/2013 1421   GFRNONAA >60 06/30/2019 0327   GFRNONAA >89 04/25/2015 1108   GFRAA >60 06/30/2019 0327   GFRAA >89 04/25/2015 1108     Assessment/Plan: 1 Day Post-Op   Principal Problem:   Avascular necrosis of head of humerus ([place])   Advance diet Up with therapy Plan for discharge tomorrow   Anticipated LOS equal to or greater than 2 midnights due to - Age 49 and older with one or more of the following:  - Obesity  - Expected need for hospital services (PT, OT, Nursing) required for safe  discharge  - Anticipated need for postoperative skilled nursing care or inpatient rehab  - Active co-morbidities: Anemia     Tina Mayo 06/30/2019, 8:47 AM   Marchia Bond, MD Cell [phone]

## 2019-06-30 NOTE — Progress Notes (Signed)
   06/30/19 2255  Vitals  Temp (!) 101.1 F (38.4 C)  BP 107/71  MAP (mmHg) 80  BP Location Left Arm  BP Method Automatic  Patient Position (if appropriate) Lying  Pulse Rate (!) 120  Resp 20  Oxygen Therapy  SpO2 90 %  O2 Device Nasal Cannula   RN gave pt cepacol lozenges, 650mg  Tylenol, 2L/min O2. Will continue to monitor.

## 2019-07-01 ENCOUNTER — Inpatient Hospital Stay (HOSPITAL_COMMUNITY): Payer: Medicaid Other

## 2019-07-01 DIAGNOSIS — M87021 Idiopathic aseptic necrosis of right humerus: Secondary | ICD-10-CM

## 2019-07-01 DIAGNOSIS — R5082 Postprocedural fever: Secondary | ICD-10-CM

## 2019-07-01 DIAGNOSIS — D696 Thrombocytopenia, unspecified: Secondary | ICD-10-CM

## 2019-07-01 DIAGNOSIS — D573 Sickle-cell trait: Secondary | ICD-10-CM

## 2019-07-01 LAB — CBC WITH DIFFERENTIAL/PLATELET
Abs Immature Granulocytes: 0.51 10*3/uL — ABNORMAL HIGH (ref 0.00–0.07)
Basophils Absolute: 0.1 10*3/uL (ref 0.0–0.1)
Basophils Relative: 0 %
Eosinophils Absolute: 0 10*3/uL (ref 0.0–0.5)
Eosinophils Relative: 0 %
HCT: 19.9 % — ABNORMAL LOW (ref 36.0–46.0)
Hemoglobin: 7.1 g/dL — ABNORMAL LOW (ref 12.0–15.0)
Immature Granulocytes: 1 %
Lymphocytes Relative: 12 %
Lymphs Abs: 4.5 10*3/uL — ABNORMAL HIGH (ref 0.7–4.0)
MCH: 30.6 pg (ref 26.0–34.0)
MCHC: 35.7 g/dL (ref 30.0–36.0)
MCV: 85.8 fL (ref 80.0–100.0)
Monocytes Absolute: 3.8 10*3/uL — ABNORMAL HIGH (ref 0.1–1.0)
Monocytes Relative: 10 %
Neutro Abs: 27.5 10*3/uL — ABNORMAL HIGH (ref 1.7–7.7)
Neutrophils Relative %: 77 %
Platelets: 63 10*3/uL — ABNORMAL LOW (ref 150–400)
RBC: 2.32 MIL/uL — ABNORMAL LOW (ref 3.87–5.11)
RDW: 14 % (ref 11.5–15.5)
WBC: 36.4 10*3/uL — ABNORMAL HIGH (ref 4.0–10.5)
nRBC: 0.3 % — ABNORMAL HIGH (ref 0.0–0.2)

## 2019-07-01 LAB — COMPREHENSIVE METABOLIC PANEL
ALT: 54 U/L — ABNORMAL HIGH (ref 0–44)
AST: 82 U/L — ABNORMAL HIGH (ref 15–41)
Albumin: 3.3 g/dL — ABNORMAL LOW (ref 3.5–5.0)
Alkaline Phosphatase: 88 U/L (ref 38–126)
Anion gap: 8 (ref 5–15)
BUN: 9 mg/dL (ref 6–20)
CO2: 26 mmol/L (ref 22–32)
Calcium: 8.3 mg/dL — ABNORMAL LOW (ref 8.9–10.3)
Chloride: 105 mmol/L (ref 98–111)
Creatinine, Ser: 0.69 mg/dL (ref 0.44–1.00)
GFR calc Af Amer: >60 mL/min (ref >60)
GFR calc non Af Amer: >60 mL/min (ref >60)
Glucose, Bld: 122 mg/dL — ABNORMAL HIGH (ref 70–99)
Potassium: 3.8 mmol/L (ref 3.5–5.1)
Sodium: 139 mmol/L (ref 135–145)
Total Bilirubin: 2.1 mg/dL — ABNORMAL HIGH (ref 0.3–1.2)
Total Protein: 6.1 g/dL — ABNORMAL LOW (ref 6.5–8.1)

## 2019-07-01 LAB — LACTIC ACID, PLASMA
Lactic Acid, Venous: 0.9 mmol/L (ref 0.5–1.9)
Lactic Acid, Venous: 0.8 mmol/L (ref 0.5–1.9)

## 2019-07-01 LAB — APTT: aPTT: 37 seconds — ABNORMAL HIGH (ref 24–36)

## 2019-07-01 LAB — FIBRINOGEN: Fibrinogen: 490 mg/dL — ABNORMAL HIGH (ref 210–475)

## 2019-07-01 LAB — D-DIMER, QUANTITATIVE: D-Dimer, Quant: 1.34 ug/mL-FEU — ABNORMAL HIGH (ref 0.00–0.50)

## 2019-07-01 LAB — LACTATE DEHYDROGENASE: LDH: 368 U/L — ABNORMAL HIGH (ref 98–192)

## 2019-07-01 MED ORDER — VANCOMYCIN HCL 10 G IV SOLR
1750.0000 mg | Freq: Once | INTRAVENOUS | Status: AC
  Administered 2019-07-01: 1750 mg via INTRAVENOUS
  Filled 2019-07-01: qty 1750

## 2019-07-01 MED ORDER — GUAIFENESIN 100 MG/5ML PO SOLN: 10.0000 mL | Freq: Four times a day (QID) | ORAL | Status: DC | PRN

## 2019-07-01 MED ORDER — IOHEXOL 350 MG/ML SOLN
100.0000 mL | Freq: Once | INTRAVENOUS | Status: AC | PRN
  Administered 2019-07-01: 14:00:00 54 mL via INTRAVENOUS

## 2019-07-01 MED ORDER — SODIUM CHLORIDE (PF) 0.9 % IJ SOLN
INTRAMUSCULAR | Status: AC
  Filled 2019-07-01: qty 50

## 2019-07-01 MED ORDER — SODIUM CHLORIDE 0.9 % IV BOLUS
500.0000 mL | Freq: Once | INTRAVENOUS | Status: AC
  Administered 2019-07-01: 04:00:00 500 mL via INTRAVENOUS

## 2019-07-01 MED ORDER — ENOXAPARIN SODIUM 30 MG/0.3ML ~~LOC~~ SOLN
30.0000 mg | Freq: Two times a day (BID) | SUBCUTANEOUS | Status: DC
  Administered 2019-07-01 – 2019-07-03 (×6): 30 mg via SUBCUTANEOUS
  Filled 2019-07-01 (×7): qty 0.3

## 2019-07-01 MED ORDER — SODIUM CHLORIDE 0.9 % IV SOLN
2.0000 g | Freq: Three times a day (TID) | INTRAVENOUS | Status: DC
  Administered 2019-07-01 – 2019-07-04 (×9): 2 g via INTRAVENOUS
  Filled 2019-07-01 (×11): qty 2

## 2019-07-01 MED ORDER — VANCOMYCIN HCL IN DEXTROSE 750-5 MG/150ML-% IV SOLN
750.0000 mg | Freq: Three times a day (TID) | INTRAVENOUS | Status: DC
  Administered 2019-07-01 – 2019-07-02 (×2): 750 mg via INTRAVENOUS
  Filled 2019-07-01 (×3): qty 150

## 2019-07-01 NOTE — Progress Notes (Signed)
OT Cancellation Note  Patient Details Name: Tina Mayo MRN: [id number] DOB: [birth year]   Cancelled Treatment:    Reason Eval/Treat Not Completed: Medical issues which prohibited therapy  Pt has fever and not ready for OT at this time. If pt will be ready for OT later today, please page me, otherwise, I will check back tomorrow.  [place] 07/01/2019, 8:18 AM  Lesle Chris, OTR/L Acute Rehabilitation Services [phone] [facility] pager [phone] office 07/01/2019

## 2019-07-01 NOTE — Consult Note (Signed)
[facility] Medical Consultation  Tina Mayo OZH:[id number] DOB: November 29, 1971 DOA: 06/29/2019 PCP: [name]   Requesting physician: Mardelle Matte MD,  Vonna Kotyk Date of consultation: 07/01/2019 Reason for consultation: Postop fever and shortness of breath  Impression/Recommendations Principal Problem:   Avascular necrosis of head of humerus [facility]) Active Problems:   Thrombocytopenia, unspecified (HCC)   Sickle cell trait (HCC)   Fever postop    1. Postop fever and shortness of breath: Suspect bilateral atelectasis. Chest physiotherapy, incentive spirometry, deep breathing exercises, sputum induction, mucolytic's and bronchodilators.  Blood cultures. Supplemental oxygen to keep saturations more than 90%. Chest CTA with history of bilateral lower lobe fibrosis, questionable history of blood clots. -Reviewed her CBC with differential, she has white blood cell count of 36,000 with neutrophils of 27,000.  Patient probably has developed severe infection with atelectasis, will treat as healthcare acquired pneumonia. Vancomycin, pharmacy to dose Cefepime, patient has tolerated cephalosporins in the past with history of penicillin allergies. Blood cultures were drawn.  2.  Anemia: Probably acute on chronic.  Hemoglobin 7.1, currently no indication for transfusion.  Will recheck in the morning. Platelets are at her usual levels since last 4 years, do not suspect any acute thrombocytopenia. She has slightly elevated bilirubin and transaminases that is mostly chronic. With acute infection and drop in hemoglobin with history of sickle cell trait, will check for any hemolysis with a PTT, d-dimer and LDH levels.  No evidence of bleeding anywhere.  2.  Avascular necrosis of head of the humerus: Status post arthroplasty and replacement.  Surgically stable as per surgery.  I will followup again tomorrow. Please contact me if I can be of assistance in the meanwhile. Thank you for this  consultation.  Chief Complaint: Fever, dry cough and shortness of breath.  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  First postop day was fairly stable.  She had received a scalene block and general anesthesia.  Since yesterday evening, patient is started having low-grade temperature, overnight she had some shortness of breath, needed 2 L of oxygen and had low-grade temperature, maximum 101.5.  Patient herself complains of being sleepy, still has effects of the anesthesia as per patient.  She states she walked with a therapist and has no problem.  Her pain is controlled. Patient states dry cough, unable to get any sputum out.  Also doing incentive spirometry, somehow limited with pleuritic pain on the right lower chest.  Denies any episodes of choking or aspiration. Chest x-ray shows bilateral lower lobe consolidation which is probably atelectasis.  Labs including CBC with differential, CMP and CTA of the chest ordered by me.  Review of Systems:  All systems reviewed.  Positive as per HPI rest are negative.  Past Medical History:  Diagnosis Date  . Anxiety   . Asthma   . Avascular necrosis of head of humerus ([place]) 06/29/2019  . Bell's palsy   . Depression   . Sickle cell trait [facility])    Past Surgical History:  Procedure Laterality Date  . benign breast tumor removed     2006  . BREAST BIOPSY Right 2014   benign  . BREAST EXCISIONAL BIOPSY Right 2007   benign   . CHOLECYSTECTOMY N/A 12/16/2018   Procedure: LAPAROSCOPIC CHOLECYSTECTOMY;  Surgeon:  Clovis Riley, MD;  Location: [facility];  Service: General;  Laterality: N/A;  . right ankle surgery     2010  . TOTAL SHOULDER ARTHROPLASTY Right 06/29/2019   Procedure: TOTAL SHOULDER  ARTHROPLASTY;  Surgeon: Marchia Bond, MD;  Location: [facility];  Service: Orthopedics;  Laterality: Right;  . TUBAL LIGATION     Social History:  reports that she has been smoking cigarettes. She has been smoking about 0.10 packs per day. She uses smokeless tobacco. She reports that she does not drink alcohol or use drugs.  Allergies  Allergen Reactions  . Levaquin [Levofloxacin In D5w] Anaphylaxis  . Penicillins Swelling    DID THE REACTION INVOLVE: Swelling of the face/tongue/throat, SOB, or low BP? Yes Sudden or severe rash/hives, skin peeling, or the inside of the mouth or nose? No Did it require medical treatment? Yes When did it last happen?8 years ago If all above answers are "NO", may proceed with cephalosporin use.    Family History  Problem Relation Age of Onset  . Breast cancer Mother   . Cancer Mother   . Diabetes Mellitus II Father   . Diabetes Father   . Breast cancer Maternal Grandmother     Prior to Admission medications   Medication Sig Start Date End Date Taking? Authorizing Provider  diclofenac sodium (VOLTAREN) 1 % GEL Apply 2 g topically 4 (four) times daily as needed (pain).   Yes [provider]  ibuprofen (ADVIL) 800 MG tablet Take 800 mg by mouth every 8 (eight) hours as needed for moderate pain.   Yes [provider]  ARIPiprazole (ABILIFY) 10 MG tablet Take 10 mg by mouth at bedtime.  01/23/17   [provider]  baclofen (LIORESAL) 10 MG tablet Take 1 tablet (10 mg total) by mouth 3 (three) times daily. As needed for muscle spasm 06/29/19   Marchia Bond, MD  Lidocaine 4 % PTCH Place 1 patch onto the skin daily as needed (pain).    [provider]  meloxicam (MOBIC) 15 MG tablet Take 15 mg by mouth daily.    [provider]  Menthol-Methyl Salicylate (MUSCLE RUB) 10-15 % CREA Apply 1 application topically as needed for muscle pain.    [provider]  mupirocin ointment (BACTROBAN) 2 % Apply 1  application topically 3 (three) times daily. Patient not taking: Reported on 06/11/2019 02/24/19   Melynda Ripple, MD  ondansetron (ZOFRAN) 4 MG tablet Take 1 tablet (4 mg total) by mouth every 8 (eight) hours as needed for nausea or vomiting. 06/29/19   Marchia Bond, MD  OVER THE COUNTER MEDICATION Take 700 mg by mouth 3 (three) times daily as needed (pain). CBD Gummies 350 mg per gummy    [provider]  oxyCODONE (ROXICODONE) 5 MG immediate release tablet Take 1 tablet (5 mg total) by mouth every 4 (four) hours as needed for severe pain. 06/29/19   Marchia Bond, MD  senna-docusate (SENOKOT-S) 8.6-50 MG tablet Take 1 tablet by mouth 2 (two) times daily as needed for mild constipation. Patient not taking: Reported on 06/11/2019 12/17/18 12/17/19  Dhungel, Flonnie Overman, MD  sennosides-docusate sodium (SENOKOT-S) 8.6-50 MG tablet Take 2 tablets by mouth daily. 06/29/19   Marchia Bond, MD  sertraline (ZOLOFT) 50 MG tablet Take 50 mg by mouth at bedtime.  01/23/17   [provider]  triamcinolone cream (KENALOG) 0.1 % Apply 1 application topically 2 (two) times daily. Apply for 2 weeks as needed for itching Patient not taking: Reported  on 06/11/2019 02/24/19   Melynda Ripple, MD   Physical Exam: Blood pressure 107/67, pulse (!) 117, temperature 98.7 F (37.1 C), temperature source Oral, resp. rate (!) 22, height 5\' 7"  (1.702 m), weight 86.6 kg, last menstrual period 01/12/2017, SpO2 97 %. Vitals:   07/01/19 0828 07/01/19 0937  BP: 109/63 107/67  Pulse: (!) 117 (!) 117  Resp: (!) 24 (!) 22  Temp: 99.2 F (37.3 C) 98.7 F (37.1 C)  SpO2: 98% 97%     General: Looks fairly comfortable.  Currently on 1 L of oxygen.  She is saturating adequate.  No distress at rest.  Slightly anxious.  Eyes: PERRLA, no drainage.  ENT: Oropharynx normal.  Nares normal.  Neck: Supple, no lymphadenopathy  Cardiovascular: S1-S2 normal.  Tachycardia.  No murmurs.  Respiratory: Fairly comfortably  breathing.  No audible added sounds.  Abdomen: Soft nontender, bowel sounds present  Skin: Intact.  Right shoulder on sling with surgical dressing intact.  Musculoskeletal: Joints are normal.  Range of motion is normal.  Right shoulder postop.  Psychiatric: Alert oriented x3.  Normal mood and affect.  Neurologic: Cranial nerves are normal.  Motor and sensory examination normal.  Labs on Admission:  Basic Metabolic Panel: Recent Labs  Lab 06/30/19 0327 07/01/19 0931  NA 139 139  K 4.3 3.8  CL 106 105  CO2 25 26  GLUCOSE 136* 122*  BUN 9 9  CREATININE 0.69 0.69  CALCIUM 9.1 8.3*   Liver Function Tests: Recent Labs  Lab 07/01/19 0931  AST 82*  ALT 54*  ALKPHOS 88  BILITOT 2.1*  PROT 6.1*  ALBUMIN 3.3*   No results for input(s): LIPASE, AMYLASE in the last 168 hours. No results for input(s): AMMONIA in the last 168 hours. CBC: Recent Labs  Lab 06/30/19 0327 07/01/19 0931  WBC 15.5* 36.4*  NEUTROABS  --  27.5*  HGB 9.4* 7.1*  HCT 26.0* 19.9*  MCV 85.0 85.8  PLT 68* 63*   Cardiac Enzymes: No results for input(s): CKTOTAL, CKMB, CKMBINDEX, TROPONINI in the last 168 hours. BNP: Invalid input(s): POCBNP CBG: No results for input(s): GLUCAP in the last 168 hours.  Radiological Exams on Admission: Dg Chest 2 View  Result Date: 07/01/2019 CLINICAL DATA:  Shortness of breath.  Recent surgery EXAM: CHEST - 2 VIEW COMPARISON:  06/03/2018 chest CT FINDINGS: Extensive opacification in the lower chest which could be aspiration or atelectasis. Normal heart size. No effusion or pneumothorax. Interval right glenohumeral arthroplasty. Avascular necrosis of the left humeral head IMPRESSION: Extensive lower chest opacification with postoperative history suggesting atelectasis or aspiration/pneumonia. Electronically Signed   By: Monte Fantasia M.D.   On: 07/01/2019 05:50   Dg Shoulder Right Port  Result Date: 06/29/2019 CLINICAL DATA:  Status post right shoulder replacement  EXAM: PORTABLE RIGHT SHOULDER COMPARISON:  None. FINDINGS: Right shoulder prosthesis is noted in satisfactory position. No acute bony abnormality is seen. IMPRESSION: Status post right shoulder replacement Electronically Signed   By: Inez Catalina M.D.   On: 06/29/2019 18:58    EKG: Independently reviewed.  Preop EKG done on 06/21/2019 shows normal sinus rhythm with no acute changes.  Time spent: 45 minutes  [facility] Pager [phone], please page through [facility] website .  If 7PM-7AM, please contact night-coverage [email or web address] Password [facility] 07/01/2019, 11:25 AM

## 2019-07-01 NOTE — Significant Event (Addendum)
Rapid Response Event Note  Overview: Time Called: 0310 Arrival Time: 0320 Event Type: MEWS  Initial Focused Assessment: Patient resting in bed. Patient feels warm, dry to touch. Patient HR reading 117 bpm on the dinamap, HR auscultated and palpated at 104 bpm. Patient has been febrile overnight. Current temperature 100.80F. BP 106/65, respiratory rate 22. Patient does not appear distressed. Patient lung sounds are clear in the upper lobes. No adventitious heart sounds and rhythm is regular upon palpation. Bandage not removed, but shoulder temperature feels the same as other parts of patient's arm and dressing is clean and intact.  Interventions: Primary RN notified attending on-call. Orders received for a chest xray and 500 cc bolus. Order placed for lactic acid blood draw per rapid response protocol. RN to utilize ice packs to help bring down patient's fever in addition to pharmacological means, if needed.   Plan of Care (if not transferred): Patient encourage to drink plenty of fluids and encouraged to use incentive spirometer. RN to follow-up with MD in regards to lactic acid result. RN to call rapid response for further needs.    Event Summary:  Tina Mayo

## 2019-07-01 NOTE — Progress Notes (Addendum)
     Subjective:  Patient has been slightly febrile overnight, persistently tachycardic to 120s, hypoxic down to the low 90s and 88%, she reports being very short of breath, difficulty taking a full breath.  She also has right-sided pleuritic chest pain.  Her shoulder feels fine.  Overnight she had rapid response called, I ordered a chest x-ray which demonstrated consolidation versus atelectasis on both lower lobes of the lungs.  Upon review of her chart I do not find any evidence for history of DVT/pulmonary embolus, and she denies family history of DVT, although she says that she gets blood clots in her legs "all the time", and has been told to keep her legs elevated when this happens, she points to bruises on her calfs.  Upon review of her chart I do not see that we have documented evidence of actual DVT.  Objective:   VITALS:   Vitals:   07/01/19 0530 07/01/19 0601 07/01/19 0656 07/01/19 0734  BP: 104/62 104/62  114/71  Pulse: (!) 116 (!) 114 (!) 112 (!) 125  Resp: (!) 28  (!) 26 (!) 24  Temp: 100.3 F (37.9 C)  (!) 100.9 F (38.3 C) (!) 100.5 F (38.1 C)  TempSrc: Oral  Oral Oral  SpO2: 100% 100% 99% 98%  Weight:      Height:        Her nerve block is worn off, all fingers flex extend and abduct, her dressings are clean.  No significant swelling or tenderness in her calfs, the left one may have a small bruise.    Lab Results  Component Value Date   WBC 15.5 (H) 06/30/2019   HGB 9.4 (L) 06/30/2019   HCT 26.0 (L) 06/30/2019   MCV 85.0 06/30/2019   PLT 68 (L) 06/30/2019   BMET    Component Value Date/Time   NA 139 06/30/2019 0327   NA 140 12/24/2013 1421   K 4.3 06/30/2019 0327   K 3.8 12/24/2013 1421   CL 106 06/30/2019 0327   CO2 25 06/30/2019 0327   CO2 25 12/24/2013 1421   GLUCOSE 136 (H) 06/30/2019 0327   GLUCOSE 88 12/24/2013 1421   BUN 9 06/30/2019 0327   BUN 5.8 (L) 12/24/2013 1421   CREATININE 0.69 06/30/2019 0327   CREATININE 0.78 04/25/2015 1108   CREATININE 0.8 12/24/2013 1421   CALCIUM 9.1 06/30/2019 0327   CALCIUM 8.8 12/24/2013 1421   GFRNONAA >60 06/30/2019 0327   GFRNONAA >89 04/25/2015 1108   GFRAA >60 06/30/2019 0327   GFRAA >89 04/25/2015 1108     Assessment/Plan: 2 Days Post-Op   Principal Problem:   Avascular necrosis of head of humerus ([place])  Acute respiratory failure, differential diagnosis includes atelectasis, pneumonia, versus pulmonary embolus.  I am going to plan for CT angiogram of the lung and chest, I will also get medical consultation for assistance, she may need to start antibiotics, I will start Lovenox, she is not ready for discharge.   Johnny Bridge 07/01/2019, 8:07 AM   Marchia Bond, MD Cell [phone]

## 2019-07-01 NOTE — Progress Notes (Addendum)
Pharmacy Antibiotic Note  Tina Mayo is a 48 y.o. female admitted on 06/29/2019 for shoulder arthroplasty.  Pharmacy has been consulted for Vancomycin & Cefepime dosing, post-op fever, ShOB  Plan: Cefepime 2gm q8 Vanc 1750mg  x1, then 750mg  q8 AUC 493, using SCr 0.8 (0.69 measured)  Height: 5\' 7"  (170.2 cm) Weight: 190 lb 14.4 oz (86.6 kg) IBW/kg (Calculated) : 61.6  Temp (24hrs), Avg:100.5 F (38.1 C), Min:98.7 F (37.1 C), Max:101.5 F (38.6 C)  Recent Labs  Lab 06/30/19 0327 07/01/19 0333 07/01/19 0628 07/01/19 0931  WBC 15.5*  --   --  36.4*  CREATININE 0.69  --   --  0.69  LATICACIDVEN  --  0.9 0.8  --     Estimated Creatinine Clearance: 96.2 mL/min (by C-G formula based on SCr of 0.69 mg/dL).    Allergies  Allergen Reactions  . Levaquin [Levofloxacin In D5w] Anaphylaxis  . Penicillins Swelling    DID THE REACTION INVOLVE: Swelling of the face/tongue/throat, SOB, or low BP? Yes Sudden or severe rash/hives, skin peeling, or the inside of the mouth or nose? No Did it require medical treatment? Yes When did it last happen?8 years ago If all above answers are "NO", may proceed with cephalosporin use.     Antimicrobials this admission: 7/23 Cefepime >>  7/23 Vanc >>   Dose adjustments this admission:  Microbiology results: 7/23 BCx: sent  7/13 pre-op Surg screen: MRSA PCR neg, Staph aureus neg  Thank you for allowing pharmacy to be a part of this patient's care.  Minda Ditto 07/01/2019 11:26 AM

## 2019-07-01 NOTE — Progress Notes (Signed)
   07/01/19 0057  Vitals  Temp (!) 101.4 F (38.6 C)   RN applied a cool washcloth to pt's neck, encouraged incentive spirometer, removed all blankets and left the top sheet on the pt. Will recheck and continue to monitor.

## 2019-07-01 NOTE — Progress Notes (Signed)
   07/01/19 0656  Vitals  Temp (!) 100.9 F (38.3 C)  Temp Source Oral  Pulse Rate (!) 112  Pulse Rate Source Monitor  Resp (!) 26  Oxygen Therapy  SpO2 99 %  MEWS Score  MEWS RR 2  MEWS Pulse 2  MEWS Systolic 0  MEWS LOC 0  MEWS Temp 1  MEWS Score 5  MEWS Score Color Red    Got report from the night shift RN. Dr. Mardelle Matte aware of what's going on. Will continue to monitor the patient closely.

## 2019-07-01 NOTE — Plan of Care (Signed)
  Problem: Education: Goal: Knowledge of General Education information will improve Description: Including pain rating scale, medication(s)/side effects and non-pharmacologic comfort measures Outcome: Progressing   Problem: Clinical Measurements: Goal: Respiratory complications will improve Outcome: Progressing Goal: Cardiovascular complication will be avoided Outcome: Progressing   Problem: Activity: Goal: Risk for activity intolerance will decrease Outcome: Progressing   Problem: Coping: Goal: Level of anxiety will decrease Outcome: Progressing   Problem: Elimination: Goal: Will not experience complications related to urinary retention Outcome: Progressing   Problem: Safety: Goal: Ability to remain free from injury will improve Outcome: Progressing

## 2019-07-02 DIAGNOSIS — R6520 Severe sepsis without septic shock: Secondary | ICD-10-CM

## 2019-07-02 DIAGNOSIS — A419 Sepsis, unspecified organism: Secondary | ICD-10-CM

## 2019-07-02 DIAGNOSIS — J189 Pneumonia, unspecified organism: Secondary | ICD-10-CM

## 2019-07-02 LAB — CBC WITH DIFFERENTIAL/PLATELET
Abs Immature Granulocytes: 0.83 10*3/uL — ABNORMAL HIGH (ref 0.00–0.07)
Basophils Absolute: 0.1 10*3/uL (ref 0.0–0.1)
Basophils Relative: 0 %
Eosinophils Absolute: 0.1 10*3/uL (ref 0.0–0.5)
Eosinophils Relative: 0 %
HCT: 18.3 % — ABNORMAL LOW (ref 36.0–46.0)
Hemoglobin: 6.3 g/dL — CL (ref 12.0–15.0)
Immature Granulocytes: 2 %
Lymphocytes Relative: 13 %
Lymphs Abs: 4.8 10*3/uL — ABNORMAL HIGH (ref 0.7–4.0)
MCH: 29.7 pg (ref 26.0–34.0)
MCHC: 34.4 g/dL (ref 30.0–36.0)
MCV: 86.3 fL (ref 80.0–100.0)
Monocytes Absolute: 3.8 10*3/uL — ABNORMAL HIGH (ref 0.1–1.0)
Monocytes Relative: 11 %
Neutro Abs: 26.4 10*3/uL — ABNORMAL HIGH (ref 1.7–7.7)
Neutrophils Relative %: 74 %
Platelets: 63 10*3/uL — ABNORMAL LOW (ref 150–400)
RBC: 2.12 MIL/uL — ABNORMAL LOW (ref 3.87–5.11)
RDW: 14.4 % (ref 11.5–15.5)
WBC: 35.9 10*3/uL — ABNORMAL HIGH (ref 4.0–10.5)
nRBC: 0.4 % — ABNORMAL HIGH (ref 0.0–0.2)

## 2019-07-02 LAB — HEMOGLOBIN AND HEMATOCRIT, BLOOD
HCT: 22.4 % — ABNORMAL LOW (ref 36.0–46.0)
Hemoglobin: 7.7 g/dL — ABNORMAL LOW (ref 12.0–15.0)

## 2019-07-02 LAB — PREPARE RBC (CROSSMATCH)

## 2019-07-02 LAB — ABO/RH: ABO/RH(D): O POS

## 2019-07-02 MED ORDER — VANCOMYCIN HCL IN DEXTROSE 1-5 GM/200ML-% IV SOLN
1000.0000 mg | Freq: Two times a day (BID) | INTRAVENOUS | Status: DC
  Administered 2019-07-02 – 2019-07-04 (×4): 1000 mg via INTRAVENOUS
  Filled 2019-07-02 (×4): qty 200

## 2019-07-02 MED ORDER — SODIUM CHLORIDE 0.9% IV SOLUTION
Freq: Once | INTRAVENOUS | Status: AC
  Administered 2019-07-02: 10:00:00 via INTRAVENOUS

## 2019-07-02 MED ORDER — METOPROLOL TARTRATE 5 MG/5ML IV SOLN
5.0000 mg | Freq: Once | INTRAVENOUS | Status: AC
  Administered 2019-07-02: 5 mg via INTRAVENOUS
  Filled 2019-07-02: qty 5

## 2019-07-02 NOTE — Progress Notes (Signed)
Pharmacy Antibiotic Note  Tina Mayo is a 48 y.o. female admitted on 06/29/2019 for shoulder arthroplasty. Developed fevers and SOB post-op; Pharmacy has been consulted for Vancomycin & Cefepime dosing to r/o HAP.  Today, 07/02/2019:  WBC remain elevated (question possibility of CDI)  Still with fevers despite starting Vanc/Cefepime  SCr stable at baseline  Plan:  Continue cefepime 2g IV q8 hr as ordered  Will adjust vanc to 1g IV q12 hr using intermediate Vd for borderline BMI (AUC 485 w/ SCr 0.8; Vd 0.65)  If still with fevers after 24 hrs abx will need to consider alternative sources (postop, fungal/viral, Cdiff)   Height: 5\' 7"  (170.2 cm) Weight: 191 lb 12.8 oz (87 kg) IBW/kg (Calculated) : 61.6  Temp (24hrs), Avg:99.9 F (37.7 C), Min:98.2 F (36.8 C), Max:102.7 F (39.3 C)  Recent Labs  Lab 06/30/19 0327 07/01/19 0333 07/01/19 0628 07/01/19 0931 07/02/19 0248  WBC 15.5*  --   --  36.4* 35.9*  CREATININE 0.69  --   --  0.69  --   LATICACIDVEN  --  0.9 0.8  --   --     Estimated Creatinine Clearance: 96.4 mL/min (by C-G formula based on SCr of 0.69 mg/dL).    Allergies  Allergen Reactions  . Levaquin [Levofloxacin In D5w] Anaphylaxis  . Penicillins Swelling    DID THE REACTION INVOLVE: Swelling of the face/tongue/throat, SOB, or low BP? Yes Sudden or severe rash/hives, skin peeling, or the inside of the mouth or nose? No Did it require medical treatment? Yes When did it last happen?8 years ago If all above answers are "NO", may proceed with cephalosporin use.     Antimicrobials this admission: 7/23 Cefepime>> 7/23 Vanc>>  Dose adjustments this admission: 7/24 adj vanc to 1g q12 (using modified Vd)  Microbiology results: 7/23BCx: ngtd 7/13 pre-op Surg screen: MRSA PCR neg, Staph aureus neg   Thank you for allowing pharmacy to be a part of this patient's care.  Reuel Boom, PharmD, BCPS [phone] 07/02/2019, 11:18  AM

## 2019-07-02 NOTE — Significant Event (Signed)
Rapid Response Event Note  Overview: Time Called: 0605 Arrival Time: 0610 Event Type: MEWS  Initial Focused Assessment: Patient awake, lying in bed. HR 125-135 bpm, RR 25-35, SpO2 86% on 2LNC, BP 114/71. Patient febrile at 102.9F orally. Patient Hgb 6.3 on morning labs.   Interventions: Tylenol given orally by primary RN. Alger Memos, NP made aware of vital signs and Hgb. Decision was made to transfer patient to stepdown and order received to transfuse 1U PRBC.   Plan of Care (if not transferred): Patient transported to stepdown unit by rapid response RN and primary RN. Bedside report provided to oncoming ICU/SD RN.   Event Summary:  Tina Mayo

## 2019-07-02 NOTE — Progress Notes (Signed)
     Subjective:  Patient reports pain as mild.  Patient transferred to ICU overnight.  She is upset because she is not going to be able to go to [facility] to pick up an order of crabs on Saturday.  She feels ok.  Mild cough.  Objective:   VITALS:   Vitals:   07/02/19 0558 07/02/19 0643 07/02/19 0706 07/02/19 0800  BP: 117/67     Pulse: (!) 119 (!) 132  (!) 108  Resp: (!) 22 (!) 36  (!) 22  Temp: (!) 101.1 F (38.4 C) (!) 102.7 F (39.3 C)    TempSrc: Oral Oral    SpO2: 100% 97%  95%  Weight:   87 kg   Height:   5\' 7"  (1.702 m)     She is somewhat diaphoretic, sweat on her brow.  She says its from menopause.   Dressings clean.  Mild hematoma.  CT with lung consolidation.  No PE.  Lab Results  Component Value Date   WBC 35.9 (H) 07/02/2019   HGB 6.3 (LL) 07/02/2019   HCT 18.3 (L) 07/02/2019   MCV 86.3 07/02/2019   PLT 63 (L) 07/02/2019   BMET    Component Value Date/Time   NA 139 07/01/2019 0931   NA 140 12/24/2013 1421   K 3.8 07/01/2019 0931   K 3.8 12/24/2013 1421   CL 105 07/01/2019 0931   CO2 26 07/01/2019 0931   CO2 25 12/24/2013 1421   GLUCOSE 122 (H) 07/01/2019 0931   GLUCOSE 88 12/24/2013 1421   BUN 9 07/01/2019 0931   BUN 5.8 (L) 12/24/2013 1421   CREATININE 0.69 07/01/2019 0931   CREATININE 0.78 04/25/2015 1108   CREATININE 0.8 12/24/2013 1421   CALCIUM 8.3 (L) 07/01/2019 0931   CALCIUM 8.8 12/24/2013 1421   GFRNONAA >60 07/01/2019 0931   GFRNONAA >89 04/25/2015 1108   GFRAA >60 07/01/2019 0931   GFRAA >89 04/25/2015 1108     Assessment/Plan: 3 Days Post-Op   Principal Problem:   Avascular necrosis of head of humerus (HCC) Active Problems:   Thrombocytopenia, unspecified ([place])   Sickle cell trait (HCC)   Fever postop  Aspiration pneumonia.  ICU care. ABLA 1 unit prbcs. Antibiotics per [facility], appreciate their help.    Anticipated LOS equal to or greater than 2 midnights due to - Age 25 and older with one or more of the  following:  - Obesity  - Expected need for hospital services (PT, OT, Nursing) required for safe  discharge  - Anticipated need for postoperative skilled nursing care or inpatient rehab  - Active co-morbidities: Respiratory Failure/COPD and Anemia OR   - Unanticipated findings during/Post Surgery: HAI (UTI, SSI, PN, VTE) and Lab abnormalities  -      Tina Mayo 07/02/2019, 8:26 AM   Marchia Bond, MD Cell [phone]

## 2019-07-02 NOTE — Progress Notes (Signed)
OT Cancellation Note  Patient Details Name: Tina Mayo MRN: [id number] DOB: 1971-11-17   Cancelled Treatment:     Pt moved to SDU.  Continues to have fever, tachy, and for PRBC today.  Will either check back this afternoon or tomorrow.  Braxson Hollingsworth 07/02/2019, 7:24 AM  Lesle Chris, OTR/L Acute Rehabilitation Services [phone] [facility] pager [phone] office 07/02/2019

## 2019-07-02 NOTE — Progress Notes (Signed)
OT Cancellation Note  Patient Details Name: Tina Mayo MRN: [id number] DOB: 04/15/1971   Cancelled Treatment:    Reason Eval/Treat Not Completed: Other (comment). Pt wants to sleep. Will try to check on her over the weekend.  [place] 07/02/2019, 1:02 PM  Lesle Chris, OTR/L Acute Rehabilitation Services [phone] [facility] pager [phone] office 07/02/2019

## 2019-07-02 NOTE — Progress Notes (Addendum)
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  First postop day was fairly stable.  She had received a scalene block and general anesthesia.  Since yesterday evening, patient is started having low-grade temperature, overnight she had some shortness of breath, needed 2 L of oxygen and had low-grade temperature, maximum 101.5.  Patient herself complains of being sleepy, still has effects of the anesthesia as per patient.  She states she walked with a therapist and has no problem.  Her pain is controlled. Patient states dry cough, unable to get any sputum out.  Also doing incentive spirometry, somehow limited with pleuritic pain on the right lower chest.  Denies any episodes of choking or aspiration. Chest x-ray shows bilateral lower lobe consolidation which is probably atelectasis.  Labs including CBC with differential, CMP and CTA of the chest.  As the morning went on, the patient became septic, hypoxic, and hypotensive. She is found to [facility] with a WBC of 35.9 and a hemoglobin of 6.3. She has been transfused with 1 unit of blood. She was transferred to the ICU following a rapid response call this morning.   Consultants  . Internal medicine.  Procedures  . Right shoulder hemiarthroplasty.  Antibiotics   Anti-infectives (From admission, onward)   Start     Dose/Rate Route Frequency Ordered Stop   07/02/19 1800  vancomycin (VANCOCIN) IVPB 1000 mg/200 mL premix     1,000 mg 200 mL/hr over 60 Minutes Intravenous Every 12 hours 07/02/19 1121     07/01/19 2100  vancomycin (VANCOCIN) IVPB 750 mg/150 ml premix  Status:   Discontinued     750 mg 150 mL/hr over 60 Minutes Intravenous Every 8 hours 07/01/19 1147 07/02/19 1121   07/01/19 1300  vancomycin (VANCOCIN) 1,750 mg in sodium chloride 0.9 % 500 mL IVPB     1,750 mg 250 mL/hr over 120 Minutes Intravenous  Once 07/01/19 1140 07/02/19 0700   07/01/19 1200  ceFEPIme (MAXIPIME) 2 g in sodium chloride 0.9 % 100 mL IVPB     2 g 200 mL/hr over 30 Minutes Intravenous Every 8 hours 07/01/19 1129     06/30/19 0000  vancomycin (VANCOCIN) IVPB 1000 mg/200 mL premix     1,000 mg 200 mL/hr over 60 Minutes Intravenous Every 12 hours 06/29/19 1830 06/30/19 0157   06/29/19 1100  vancomycin (VANCOCIN) IVPB 1000 mg/200 mL premix     1,000 mg 200 mL/hr over 60 Minutes Intravenous  Once 06/29/19 1045 06/29/19 1351   06/29/19 1015  ceFAZolin (ANCEF) IVPB 2g/100 mL premix  Status:  Discontinued     2 g 200 mL/hr over 30 Minutes Intravenous On call to O.R. 06/29/19 1013 06/29/19 1046    .   Subjective  The patient is resting comfortably. No new complaints.  Objective   Vitals:  Vitals:   07/02/19 1500 07/02/19 1516  BP: (!) 119/59   Pulse: (!) 119   Resp: (!) 32   Temp:  (!) 101.2 F (38.4 C)  SpO2: 97%     Exam:  Constitutional:  . The patient is awake, alert, and oriented x 3. No acute distress. Respiratory:  . No increased work of  breathing. . No wheezes, positive for rales, no rhonchi. . No tactile fremitus. Cardiovascular:  . Regular rate and rhythm. . No murmurs, ectopy, or gallups . No lateral PMI. No thrills. Abdomen:  . Abdomen is soft, non-tender, non-distended. . No hernias, masses, or organomegaly. . Normoactive bowel sounds. Musculoskeletal:  . Right shoulder is in a sling and supported on towels. . No cyanosis, clubbing, or edema Skin:  . No rashes, lesions, ulcers . palpation of skin: no induration or nodules Neurologic:  . CN 2-12 intact . Sensation all 4 extremities intact Psychiatric:  . Mental status o Mood, affect  appropriate o Orientation to person, place, time  . judgment and insight appear intact    I have personally reviewed the following:   Today's Data  . Vitals, CBC, bmp  Micro Data  . Blood Culture x 2  Imaging  . CTA of chest: right greater than left dependent bibasilar heterogeneous airspace disease and consolidation with near total consolidation of the right lower love and extensive involvement of the right middle lobe and left lung base. These findings are consistent with extensive multifocal infection and/or aspiration.   Scheduled Meds: . ARIPiprazole  10 mg Oral QHS  . docusate sodium  100 mg Oral BID  . enoxaparin (LOVENOX) injection  30 mg Subcutaneous Q12H  . mupirocin ointment  1 application Topical TID   Continuous Infusions: . 0.45 % NaCl with KCl 20 mEq / L Stopped (06/30/19 0850)  . ceFEPime (MAXIPIME) IV Stopped (07/02/19 1230)  . methocarbamol (ROBAXIN) IV Stopped (06/29/19 1902)  . vancomycin      Principal Problem:   Avascular necrosis of head of humerus (HCC) Active Problems:   Thrombocytopenia, unspecified (HCC)   Sickle cell trait ([place])   Fever postop   LOS: 3 days   A & P  Severe Sepsis: Fever, tachycardia, hypotension, leukocytosis with evidence of multilobar pneumonia on CTA Chest and hypoxia. Blood cultures pending. The patient has been moved to the ICU following a rapid response. She is receiving IV Cefepime and vancomycin and IV fluids. Blood cultures have been drawn and are pending.   HCAP: Concern for aspiration pneumonia/healthcare acquired pneumonia. She is receiving IV cefepime and vancomycin. Blood cultures are pending.   Acute hypoxic respiratory failure: Patient is currently saturating in the mid nineties on 2L O2 by nasal cannula  Postop fever and shortness of breath: Suspect bilateral atelectasis. Chest physiotherapy, incentive spirometry, deep breathing exercises, sputum induction, mucolytic's and bronchodilators.  Blood cultures.  Supplemental oxygen to keep saturations more than 90%.   Anemia: Probably acute on chronic.  Hemoglobin down to 6.3 this morning down from 7.1. , She has been transfused with 1 unit PRBC's. Platelets are 63. She is chronically thrombocytopenia in the range of 50-70. Monitor PTT, d-dimer, and LDH for evidenceof hemolysis given her history of sickle cell trait. There is no evidence of acute bleed. Transfuse platelets for acute bleed.   Avascular necrosis of head of the humerus: Status post arthroplasty and replacement.  Surgically stable as per surgery.  I have seen and examined this patient myself. I have spent 38 minutes in her evaluation and care.  DVT prophylaxis: Lovenox Code Status: Full Code Family Communication: None available Disposition Plan: tbd.   Rian Koon, DO [facility] Direct contact: see [email or web address]  7PM-7AM contact night coverage as above 07/02/2019, 3:41 PM  LOS: 3 days

## 2019-07-03 LAB — CBC WITH DIFFERENTIAL/PLATELET
Abs Immature Granulocytes: 0.72 10*3/uL — ABNORMAL HIGH (ref 0.00–0.07)
Basophils Absolute: 0.1 10*3/uL (ref 0.0–0.1)
Basophils Relative: 0 %
Eosinophils Absolute: 0.1 10*3/uL (ref 0.0–0.5)
Eosinophils Relative: 0 %
HCT: 21.3 % — ABNORMAL LOW (ref 36.0–46.0)
Hemoglobin: 7.7 g/dL — ABNORMAL LOW (ref 12.0–15.0)
Immature Granulocytes: 2 %
Lymphocytes Relative: 12 %
Lymphs Abs: 3.8 10*3/uL (ref 0.7–4.0)
MCH: 30.7 pg (ref 26.0–34.0)
MCHC: 36.2 g/dL — ABNORMAL HIGH (ref 30.0–36.0)
MCV: 84.9 fL (ref 80.0–100.0)
Monocytes Absolute: 2.8 10*3/uL — ABNORMAL HIGH (ref 0.1–1.0)
Monocytes Relative: 9 %
Neutro Abs: 25 10*3/uL — ABNORMAL HIGH (ref 1.7–7.7)
Neutrophils Relative %: 77 %
Platelets: 68 10*3/uL — ABNORMAL LOW (ref 150–400)
RBC: 2.51 MIL/uL — ABNORMAL LOW (ref 3.87–5.11)
RDW: 14.1 % (ref 11.5–15.5)
WBC: 32.5 10*3/uL — ABNORMAL HIGH (ref 4.0–10.5)
nRBC: 1.2 % — ABNORMAL HIGH (ref 0.0–0.2)

## 2019-07-03 LAB — BPAM RBC
Blood Product Expiration Date: 202008192359
ISSUE DATE / TIME: 202007240920
Unit Type and Rh: 5100

## 2019-07-03 LAB — COMPREHENSIVE METABOLIC PANEL
ALT: 72 U/L — ABNORMAL HIGH (ref 0–44)
AST: 68 U/L — ABNORMAL HIGH (ref 15–41)
Albumin: 2.8 g/dL — ABNORMAL LOW (ref 3.5–5.0)
Alkaline Phosphatase: 106 U/L (ref 38–126)
Anion gap: 10 (ref 5–15)
BUN: 9 mg/dL (ref 6–20)
CO2: 23 mmol/L (ref 22–32)
Calcium: 8.2 mg/dL — ABNORMAL LOW (ref 8.9–10.3)
Chloride: 104 mmol/L (ref 98–111)
Creatinine, Ser: 0.54 mg/dL (ref 0.44–1.00)
GFR calc Af Amer: >60 mL/min (ref >60)
GFR calc non Af Amer: >60 mL/min (ref >60)
Glucose, Bld: 106 mg/dL — ABNORMAL HIGH (ref 70–99)
Potassium: 3.5 mmol/L (ref 3.5–5.1)
Sodium: 137 mmol/L (ref 135–145)
Total Bilirubin: 5.1 mg/dL — ABNORMAL HIGH (ref 0.3–1.2)
Total Protein: 6 g/dL — ABNORMAL LOW (ref 6.5–8.1)

## 2019-07-03 LAB — TYPE AND SCREEN
ABO/RH(D): O POS
Antibody Screen: NEGATIVE
Unit division: 0

## 2019-07-03 MED ORDER — CHLORHEXIDINE GLUCONATE CLOTH 2 % EX PADS
6.0000 | MEDICATED_PAD | Freq: Every day | CUTANEOUS | Status: DC
  Administered 2019-07-03: 6 via TOPICAL

## 2019-07-03 MED ORDER — SODIUM CHLORIDE 0.9 % IV SOLN
INTRAVENOUS | Status: DC
  Administered 2019-07-03 (×3): via INTRAVENOUS

## 2019-07-03 NOTE — Progress Notes (Signed)
Occupational Therapy Treatment Patient Details Name: Tina Mayo MRN: [id number] DOB: May 03, 1971 Today's Date: 07/03/2019    History of present illness Pt s/p Right shoulder hemiarthroplasty. She was recovering well but became septic and was ultimately transferred to ICU on 07/02/2019.   OT comments  Pt has been transferred to ICU since previous assessment. She is now demonstrating decreased processing skills and ability to follow commands but was willing and eager to participate especially to walk to the bathroom. She was able to complete ambulating toilet transfer with min guard assist. She currently requires maximum assistance for UB ADL. Continued education concerning precautions and elbow/wrist/hand AROM. Will continue to follow while admitted.    Follow Up Recommendations  Follow surgeon's recommendation for DC plan and follow-up therapies    Equipment Recommendations  None recommended by OT    Recommendations for Other Services      Precautions / Restrictions Precautions Precautions: Shoulder Type of Shoulder Precautions: No shoulder movement allowed. Elbow, wrist and hand ROM ok Shoulder Interventions: Shoulder sling/immobilizer Precaution Booklet Issued: No Required Braces or Orthoses: Sling Restrictions Weight Bearing Restrictions: Yes RUE Weight Bearing: Non weight bearing       Mobility Bed Mobility Overal bed mobility: Modified Independent                Transfers Overall transfer level: Needs assistance   Transfers: Sit to/from Stand Sit to Stand: Min guard              Balance Overall balance assessment: Needs assistance Sitting-balance support: No upper extremity supported;Feet supported Sitting balance-Leahy Scale: Good     Standing balance support: No upper extremity supported;During functional activity Standing balance-Leahy Scale: Fair                             ADL either performed or assessed with clinical  judgement   ADL Overall ADL's : Needs assistance/impaired                 Upper Body Dressing : Maximal assistance;Sitting Upper Body Dressing Details (indicate cue type and reason): for sling Lower Body Dressing: Total assistance;Bed level Lower Body Dressing Details (indicate cue type and reason): for socks Toilet Transfer: Min guard;Ambulation Toilet Transfer Details (indicate cue type and reason): Guarding and assistance for line management Toileting- Clothing Manipulation and Hygiene: Supervision/safety;Sitting/lateral lean       Functional mobility during ADLs: Min guard General ADL Comments: Pt demonstrating decreased activity tolerance. Some confusion     Vision Patient Visual Report: No change from baseline     Perception     Praxis      Cognition Arousal/Alertness: Lethargic Behavior During Therapy: Agitated Overall Cognitive Status: Impaired/Different from baseline Area of Impairment: Attention;Following commands;Safety/judgement;Awareness                   Current Attention Level: Selective   Following Commands: Follows one step commands with increased time Safety/Judgement: Decreased awareness of safety;Decreased awareness of deficits Awareness: Intellectual;Emergent   General Comments: Pt with need for increased time for processing.         Exercises Exercises: Shoulder Shoulder Exercises Elbow Flexion: AROM;AAROM;Right;5 reps;Seated Elbow Extension: AROM;AAROM;Right;5 reps;Seated Wrist Flexion: AROM;Right;10 reps;Supine Wrist Extension: AROM;Right;10 reps;Supine Digit Composite Flexion: AROM;Right;10 reps;Supine Composite Extension: AROM;Right;10 reps;Supine   Shoulder Instructions       General Comments      Pertinent Vitals/ Pain       Pain Assessment: Faces Faces  Pain Scale: Hurts even more Pain Location: R shoulder Pain Descriptors / Indicators: Sore;Discomfort;Grimacing Pain Intervention(s): Limited activity within  patient's tolerance;Monitored during session;Repositioned  Home Living Family/patient expects to be discharged to:: Private residence Living Arrangements: Alone;Other relatives(nephew) Available Help at Discharge: Family                             Additional Comments: will need to further assess home set-up with family or when pt more coherent      Prior Functioning/Environment Level of Independence: Independent        Comments: 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   Frequency  Min 2X/week        Progress Toward Goals  OT Goals(current goals can now be found in the care plan section)     Acute Rehab OT Goals Patient Stated Goal: go home OT Goal Formulation: With patient Time For Goal Achievement: 07/07/19 Potential to Achieve Goals: Good  Plan      Co-evaluation                 AM-PAC OT "6 Clicks" Daily Activity     Outcome Measure   Help from another person eating meals?: None Help from another person taking care of personal grooming?: A Little Help from another person toileting, which includes using toliet, bedpan, or urinal?: A Little Help from another person bathing (including washing, rinsing, drying)?: A Lot Help from another person to put on and taking off regular upper body clothing?: A Lot Help from another person to put on and taking off regular lower body clothing?: A Lot 6 Click Score: 16    End of Session Equipment Utilized During Treatment: Other (comment);Oxygen(sling)  OT Visit Diagnosis: Muscle weakness (generalized) (M62.81);Pain;Other symptoms and signs involving cognitive function Pain - Right/Left: Right Pain - part of body: Shoulder   Activity Tolerance Patient limited by fatigue   Patient Left in chair;with call bell/phone within reach   Nurse Communication Mobility status        Time: 7867-6720 OT Time Calculation (min): 44 min  Charges: OT General Charges $OT Visit: 1 Visit OT Treatments $Self  Care/Home Management : 38-52 mins  [place] A Ramona Slinger 07/03/2019, 11:32 AM

## 2019-07-03 NOTE — Progress Notes (Signed)
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  First postop day was fairly stable.  She had received a scalene block and general anesthesia.  Since yesterday evening, patient is started having low-grade temperature, overnight she had some shortness of breath, needed 2 L of oxygen and had low-grade temperature, maximum 101.5.  Patient herself complains of being sleepy, still has effects of the anesthesia as per patient.  She states she walked with a therapist and has no problem.  Her pain is controlled. Patient states dry cough, unable to get any sputum out.  Also doing incentive spirometry, somehow limited with pleuritic pain on the right lower chest.  Denies any episodes of choking or aspiration. Chest x-ray shows bilateral lower lobe consolidation which is probably atelectasis.  Labs including CBC with differential, CMP and CTA of the chest.  As the morning went on, the patient became septic, hypoxic, and hypotensive. She is found to have HCAP with a WBC of 35.9 and a hemoglobin of 6.3. She has been transfused with 1 unit of blood. She was transferred to the ICU following a rapid response call this morning.   Consultants  . Internal medicine.  Procedures  . Right shoulder hemiarthroplasty.  Antibiotics   Anti-infectives (From admission, onward)   Start     Dose/Rate Route Frequency Ordered Stop   07/02/19 1800  vancomycin (VANCOCIN) IVPB 1000 mg/200 mL premix     1,000 mg 200 mL/hr over 60 Minutes Intravenous Every 12 hours 07/02/19 1121     07/01/19 2100  vancomycin (VANCOCIN) IVPB 750 mg/150 ml premix  Status:   Discontinued     750 mg 150 mL/hr over 60 Minutes Intravenous Every 8 hours 07/01/19 1147 07/02/19 1121   07/01/19 1300  vancomycin (VANCOCIN) 1,750 mg in sodium chloride 0.9 % 500 mL IVPB     1,750 mg 250 mL/hr over 120 Minutes Intravenous  Once 07/01/19 1140 07/02/19 0700   07/01/19 1200  ceFEPIme (MAXIPIME) 2 g in sodium chloride 0.9 % 100 mL IVPB     2 g 200 mL/hr over 30 Minutes Intravenous Every 8 hours 07/01/19 1129     06/30/19 0000  vancomycin (VANCOCIN) IVPB 1000 mg/200 mL premix     1,000 mg 200 mL/hr over 60 Minutes Intravenous Every 12 hours 06/29/19 1830 06/30/19 0157   06/29/19 1100  vancomycin (VANCOCIN) IVPB 1000 mg/200 mL premix     1,000 mg 200 mL/hr over 60 Minutes Intravenous  Once 06/29/19 1045 06/29/19 1351   06/29/19 1015  ceFAZolin (ANCEF) IVPB 2g/100 mL premix  Status:  Discontinued     2 g 200 mL/hr over 30 Minutes Intravenous On call to O.R. 06/29/19 1013 06/29/19 1046      Subjective  The patient is resting comfortably. No new complaints.  Objective   Vitals:  Vitals:   07/03/19 0900 07/03/19 1200  BP: (!) 99/56   Pulse: (!) 112   Resp: (!) 21   Temp:  (!) 101 F (38.3 C)  SpO2: 96%     Exam:  Constitutional:  . The patient is awake, alert, and oriented x 3. No acute distress. Respiratory:  . No increased work of  breathing. . No wheezes, positive for rales, no rhonchi. . No tactile fremitus. Cardiovascular:  . Regular rate and rhythm. . No murmurs, ectopy, or gallups . No lateral PMI. No thrills. Abdomen:  . Abdomen is soft, non-tender, non-distended. . No hernias, masses, or organomegaly. . Normoactive bowel sounds. Musculoskeletal:  . Right shoulder is in a sling and supported on towels. . No cyanosis, clubbing, or edema Skin:  . No rashes, lesions, ulcers . palpation of skin: no induration or nodules Neurologic:  . CN 2-12 intact . Sensation all 4 extremities intact Psychiatric:  . Mental status o Mood, affect appropriate  o Orientation to person, place, time  . judgment and insight appear intact    I have personally reviewed the following:   Today's Data  . Vitals, CBC, bmp  Micro Data  . Blood Culture x 2: No growth x 2.  Imaging  . CTA of chest: right greater than left dependent bibasilar heterogeneous airspace disease and consolidation with near total consolidation of the right lower love and extensive involvement of the right middle lobe and left lung base. These findings are consistent with extensive multifocal infection and/or aspiration.   Scheduled Meds: . ARIPiprazole  10 mg Oral QHS  . Chlorhexidine Gluconate Cloth  6 each Topical Daily  . docusate sodium  100 mg Oral BID  . enoxaparin (LOVENOX) injection  30 mg Subcutaneous Q12H  . mupirocin ointment  1 application Topical TID   Continuous Infusions: . sodium chloride 100 mL/hr at 07/03/19 1217  . ceFEPime (MAXIPIME) IV 2 g (07/03/19 1219)  . methocarbamol (ROBAXIN) IV Stopped (06/29/19 1902)  . vancomycin Stopped (07/03/19 0600)    Principal Problem:   Avascular necrosis of head of humerus (HCC) Active Problems:   Thrombocytopenia, unspecified ([place])   Sickle cell trait ([place])   Fever postop   LOS: 4 days   A & P  Severe Sepsis: Resolved. Fever, tachycardia, hypotension, leukocytosis with evidence of multilobar pneumonia on CTA Chest and hypoxia. Blood cultures have had no growth x 2 days.. The patient has been moved to the ICU following a rapid response. She is receiving IV Cefepime and vancomycin and IV fluids. Blood cultures have been drawn and are pending. I believe that she is safe to transfer out of the ICU.  HCAP: Concern for aspiration pneumonia/healthcare acquired pneumonia. She is receiving IV cefepime and vancomycin. Blood cultures are pending. Patient states that she was feeling better.  Acute hypoxic respiratory failure: Patient is currently saturating in the mid-high nineties on 2L O2 by nasal cannula  Postop fever  and shortness of breath: Suspect bilateral atelectasis. No further fevers. Chest physiotherapy, incentive spirometry, deep breathing exercises, sputum induction, mucolytic's and bronchodilators.  Blood cultures. Supplemental oxygen to keep saturations more than 90%.  Anemia: Probably acute on chronic.  Hemoglobin down to 6.3 this morning down from 7.1. , She has been transfused with 1 unit PRBC's. Hemoglobin is up to 7.7. Platelets are 63. She is chronically thrombocytopenia in the range of 50-70. Monitor PTT, d-dimer, and LDH for evidenceof hemolysis given her history of sickle cell trait. There is no evidence of acute bleed. Transfuse platelets for acute bleed with low platelets.   Avascular necrosis of head of the humerus: Status post arthroplasty and replacement.  Surgically stable as per surgery.  I have seen and examined this patient myself. I have spent 35 minutes in her evaluation and care. The patient is appropriate for transfer out of ICU.  DVT prophylaxis: Lovenox Code  Status: Full Code Family Communication: None available Disposition Plan: tbd.   Kinte Trim, DO [facility] Direct contact: see [email or web address]  7PM-7AM contact night coverage as above 07/02/2019, 3:41 PM  LOS: 3 days

## 2019-07-03 NOTE — Progress Notes (Signed)
Pt removed nasal canula and oxygen saturations were maintaining 87-89%. Pt is refusing to reapply oxygen. I educated the pt on why the oxygen was important and that her oxygen saturations were dropping. She stated that she "didn't care and wasn't putting it back in". Pt encouraged to take deep breathes and to place oxygen back on. Will continue to monitor and educate again

## 2019-07-03 NOTE — Progress Notes (Signed)
Patient ID: Tina Mayo, female   DOB: 10/19/1971, 48 y.o.   MRN: [id number]   LOS: 4 days   Subjective: Having some shoulder pain, denies N/T.   Objective: Vital signs in last 24 hours: Temp:  [98.9 F (37.2 C)-101.2 F (38.4 C)] 101 F (38.3 C) (07/25 1200) Pulse Rate:  [101-119] 115 (07/25 1300) Resp:  [20-32] 26 (07/25 1300) BP: (95-137)/(56-74) 137/59 (07/25 1300) SpO2:  [88 %-100 %] 98 % (07/25 1300) Last BM Date: 06/28/19   Laboratory  CBC Recent Labs    07/02/19 0248 07/02/19 1436 07/03/19 0202  WBC 35.9*  --  32.5*  HGB 6.3* 7.7* 7.7*  HCT 18.3* 22.4* 21.3*  PLT 63*  --  68*   BMET Recent Labs    07/01/19 0931 07/03/19 0202  NA 139 137  K 3.8 3.5  CL 105 104  CO2 26 23  GLUCOSE 122* 106*  BUN 9 9  CREATININE 0.69 0.54  CALCIUM 8.3* 8.2*     Physical Exam General appearance: alert and no distress  RUE: Incision C/D/I, no erythema   Assessment/Plan: S/p right shoulder arthroplasty  Multiple medical problems including sepsis, HCAP, ARF, anemia -- per medical service, appreciate their help. Will transfer to floor per their recommendations.    Lisette Abu, PA-C Orthopedic Surgery [phone] 07/03/2019

## 2019-07-04 LAB — CBC WITH DIFFERENTIAL/PLATELET
Abs Immature Granulocytes: 1.08 10*3/uL — ABNORMAL HIGH (ref 0.00–0.07)
Basophils Absolute: 0.1 10*3/uL (ref 0.0–0.1)
Basophils Relative: 0 %
Eosinophils Absolute: 0.3 10*3/uL (ref 0.0–0.5)
Eosinophils Relative: 1 %
HCT: 21.9 % — ABNORMAL LOW (ref 36.0–46.0)
Hemoglobin: 7.5 g/dL — ABNORMAL LOW (ref 12.0–15.0)
Immature Granulocytes: 4 %
Lymphocytes Relative: 15 %
Lymphs Abs: 4.3 10*3/uL — ABNORMAL HIGH (ref 0.7–4.0)
MCH: 30.2 pg (ref 26.0–34.0)
MCHC: 34.2 g/dL (ref 30.0–36.0)
MCV: 88.3 fL (ref 80.0–100.0)
Monocytes Absolute: 2.7 10*3/uL — ABNORMAL HIGH (ref 0.1–1.0)
Monocytes Relative: 9 %
Neutro Abs: 20.9 10*3/uL — ABNORMAL HIGH (ref 1.7–7.7)
Neutrophils Relative %: 71 %
Platelets: 106 10*3/uL — ABNORMAL LOW (ref 150–400)
RBC: 2.48 MIL/uL — ABNORMAL LOW (ref 3.87–5.11)
RDW: 14.6 % (ref 11.5–15.5)
WBC: 29.4 10*3/uL — ABNORMAL HIGH (ref 4.0–10.5)
nRBC: 8.6 % — ABNORMAL HIGH (ref 0.0–0.2)

## 2019-07-04 LAB — BASIC METABOLIC PANEL
Anion gap: 10 (ref 5–15)
BUN: 7 mg/dL (ref 6–20)
CO2: 23 mmol/L (ref 22–32)
Calcium: 8 mg/dL — ABNORMAL LOW (ref 8.9–10.3)
Chloride: 106 mmol/L (ref 98–111)
Creatinine, Ser: 0.51 mg/dL (ref 0.44–1.00)
GFR calc Af Amer: >60 mL/min (ref >60)
GFR calc non Af Amer: >60 mL/min (ref >60)
Glucose, Bld: 110 mg/dL — ABNORMAL HIGH (ref 70–99)
Potassium: 3.8 mmol/L (ref 3.5–5.1)
Sodium: 139 mmol/L (ref 135–145)

## 2019-07-04 MED ORDER — MENTHOL 3 MG MT LOZG: 1.0000 | LOZENGE | OROMUCOSAL | 12 refills | Status: DC | PRN

## 2019-07-04 MED ORDER — GUAIFENESIN 100 MG/5ML PO SOLN: 10.0000 mL | Freq: Four times a day (QID) | ORAL | 0 refills | Status: DC | PRN

## 2019-07-04 MED ORDER — OXYCODONE-ACETAMINOPHEN 5-325 MG PO TABS: 1.0000 | ORAL_TABLET | ORAL | 0 refills | Status: AC | PRN

## 2019-07-04 MED ORDER — DOXYCYCLINE HYCLATE 100 MG PO TABS: 100.0000 mg | ORAL_TABLET | Freq: Two times a day (BID) | ORAL | 0 refills | Status: DC

## 2019-07-04 MED ORDER — DOXYCYCLINE HYCLATE 100 MG PO TABS: 100.0000 mg | ORAL_TABLET | Freq: Two times a day (BID) | ORAL | Status: DC

## 2019-07-04 MED ORDER — SERTRALINE HCL 50 MG PO TABS: 50.0000 mg | ORAL_TABLET | Freq: Every day | ORAL | Status: DC

## 2019-07-04 MED ORDER — PHENOL 1.4 % MT LIQD: 1.0000 | OROMUCOSAL | 0 refills | Status: DC | PRN

## 2019-07-04 NOTE — Progress Notes (Signed)
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  First postop day was fairly stable.  She had received a scalene block and general anesthesia.  Since yesterday evening, patient is started having low-grade temperature, overnight she had some shortness of breath, needed 2 L of oxygen and had low-grade temperature, maximum 101.5.  Patient herself complains of being sleepy, still has effects of the anesthesia as per patient.  She states she walked with a therapist and has no problem.  Her pain is controlled. Patient states dry cough, unable to get any sputum out.  Also doing incentive spirometry, somehow limited with pleuritic pain on the right lower chest.  Denies any episodes of choking or aspiration. Chest x-ray shows bilateral lower lobe consolidation which is probably atelectasis.  Labs including CBC with differential, CMP and CTA of the chest.  As the morning went on, the patient became septic, hypoxic, and hypotensive. She is found to have HCAP with a WBC of 35.9 and a hemoglobin of 6.3. She has been transfused with 1 unit of blood. She was transferred to the ICU following a rapid response call this morning.   Consultants  . Internal medicine.  Procedures  . Right shoulder hemiarthroplasty.  Antibiotics   Anti-infectives (From admission, onward)   Start     Dose/Rate Route Frequency Ordered Stop   07/02/19 1800  vancomycin (VANCOCIN) IVPB 1000 mg/200 mL premix     1,000 mg 200 mL/hr over 60 Minutes Intravenous Every 12 hours 07/02/19 1121     07/01/19 2100  vancomycin (VANCOCIN) IVPB 750 mg/150 ml premix  Status:   Discontinued     750 mg 150 mL/hr over 60 Minutes Intravenous Every 8 hours 07/01/19 1147 07/02/19 1121   07/01/19 1300  vancomycin (VANCOCIN) 1,750 mg in sodium chloride 0.9 % 500 mL IVPB     1,750 mg 250 mL/hr over 120 Minutes Intravenous  Once 07/01/19 1140 07/02/19 0700   07/01/19 1200  ceFEPIme (MAXIPIME) 2 g in sodium chloride 0.9 % 100 mL IVPB     2 g 200 mL/hr over 30 Minutes Intravenous Every 8 hours 07/01/19 1129     06/30/19 0000  vancomycin (VANCOCIN) IVPB 1000 mg/200 mL premix     1,000 mg 200 mL/hr over 60 Minutes Intravenous Every 12 hours 06/29/19 1830 06/30/19 0157   06/29/19 1100  vancomycin (VANCOCIN) IVPB 1000 mg/200 mL premix     1,000 mg 200 mL/hr over 60 Minutes Intravenous  Once 06/29/19 1045 06/29/19 1351   06/29/19 1015  ceFAZolin (ANCEF) IVPB 2g/100 mL premix  Status:  Discontinued     2 g 200 mL/hr over 30 Minutes Intravenous On call to O.R. 06/29/19 1013 06/29/19 1046      Subjective  Nursing states that the patient has been oppositional, agitated, and aggressive for the last day. As I approach the room she is heard loudly in the hall yelling at someone on the phone. She has been abusive to staff for the last day. She has removed her oxygen, but is saturating in the nineties and is not short of air while yelling into the phone.  Objective   Vitals:  Vitals:   07/04/19  0200 07/04/19 0535  BP: 130/69 (!) 139/56  Pulse: (!) 112 (!) 119  Resp: (!) 22 20  Temp:  100.1 F (37.8 C)  SpO2: 94% 91%    Exam:  Constitutional:  . The patient is awake, alert, and oriented x 3. She is agitated, angry, and wanting to go home. Respiratory:  . No increased work of breathing. . No wheezes, positive for rales, no rhonchi. . No tactile fremitus. Cardiovascular:  . Regular rate and rhythm. . No murmurs, ectopy, or gallups . No lateral PMI. No thrills. Abdomen:  . Abdomen is soft, non-tender, non-distended. . No hernias, masses, or organomegaly. .  Normoactive bowel sounds. Musculoskeletal:  . Right shoulder is in a sling and supported on towels. . No cyanosis, clubbing, or edema Skin:  . No rashes, lesions, ulcers . palpation of skin: no induration or nodules Neurologic:  . CN 2-12 intact . Sensation all 4 extremities intact Psychiatric:  . Mental status o Mood: oppositional Affect: Agitated o Orientation to person, place, time  . judgment and insight unable to evaluate.  I have personally reviewed the following:   Today's Data  . Vitals, CBC, bmp  Micro Data  . Blood Culture x 2: No growth x 2.  Imaging  . CTA of chest: right greater than left dependent bibasilar heterogeneous airspace disease and consolidation with near total consolidation of the right lower love and extensive involvement of the right middle lobe and left lung base. These findings are consistent with extensive multifocal infection and/or aspiration.   Scheduled Meds: . ARIPiprazole  10 mg Oral QHS  . Chlorhexidine Gluconate Cloth  6 each Topical Daily  . docusate sodium  100 mg Oral BID  . enoxaparin (LOVENOX) injection  30 mg Subcutaneous Q12H  . mupirocin ointment  1 application Topical TID   Continuous Infusions: . sodium chloride 100 mL/hr at 07/04/19 0600  . ceFEPime (MAXIPIME) IV 2 g (07/04/19 0423)  . methocarbamol (ROBAXIN) IV Stopped (06/29/19 1902)  . vancomycin 1,000 mg (07/04/19 0545)    Principal Problem:   Avascular necrosis of head of humerus (HCC) Active Problems:   Thrombocytopenia, unspecified ([place])   Sickle cell trait ([place])   Fever postop   LOS: 5 days   A & P  Severe Sepsis: Resolved. Fever, tachycardia, hypotension, leukocytosis with evidence of multilobar pneumonia on CTA Chest and hypoxia. Blood cultures have had no growth x 3 days.. The patient has been moved to the ICU following a rapid response. She is receiving IV Cefepime and vancomycin and IV fluids. I will convert this to oral antibiotics. Blood cultures have  been drawn and have had no growth.   HCAP: Concern for aspiration pneumonia/healthcare acquired pneumonia. She is receiving IV cefepime and vancomycin. I will convert these to oral antibiotics.  Blood cultures have had no growth.  Acute hypoxic respiratory failure: Resolved. The patient is saturating in the 90's on room air.   Postop fever and shortness of breath: Suspect bilateral atelectasis. No further fevers. Chest physiotherapy, incentive spirometry, deep breathing exercises, sputum induction, mucolytic's and bronchodilators.  Blood cultures have had no growth.   Anemia: Probably acute on chronic.  Hemoglobin down to 6.3 morning of 07/02/2019 down from 7.1. , She has been transfused with 1 unit PRBC's. Hemoglobin is up to 7.5. Platelets are up to 106. She is chronically thrombocytopenia in the range of 50-70. Monitor PTT, d-dimer, and LDH for evidenceof hemolysis given her history of sickle cell trait. There is no evidence of  acute bleed. Transfuse platelets for acute bleed with low platelets.   Avascular necrosis of head of the humerus: Status post arthroplasty and replacement.  Surgically stable as per surgery.  I have seen and examined this patient myself. I have spent 35 minutes in her evaluation and care. The patient is appropriate for discharge from a medical standpoint.  DVT prophylaxis: Lovenox Code Status: Full Code Family Communication: None available Disposition Plan: tbd.   Franco Duley, DO [facility] Direct contact: see [email or web address]  7PM-7AM contact night coverage as above 07/04/2019, 9:57 PM  LOS: 3 days

## 2019-07-04 NOTE — Progress Notes (Signed)
Occupational Therapy Treatment Patient Details Name: Tina Mayo MRN: [id number] DOB: 27-Jan-1971 Today's Date: 07/04/2019    History of present illness Pt s/p Right shoulder hemiarthroplasty. She was recovering well but became septic and was ultimately transferred to ICU on 07/02/2019.   OT comments  Pt progressing toward stated goals, continues to present with some cognitive/possible psychosocial complications at time of treatment. She is very tangential and a bit labile, needing increased time to process multi step cues. Focused session on dressing for d/c. Pt recalled how to correctly doff sling. Needed many safety and VC's for adaptive dressing. Pt needing min A to pull sports bra down in back- she says at home she will not wear a bra or get her nephew to help if she is leaving the house. Pt stood to don underwear and shorts at supervision level, donned tank top at supervision level with VC's. . Pt needing min A and VC's to don sling, at first attempting to don in wrong order. Reinforced the importance of wrist, elbow, and hand exercises. Also reinforced precautions of not moving shoulder. Pt is to d/c this date.   Follow Up Recommendations  Follow surgeon's recommendation for DC plan and follow-up therapies    Equipment Recommendations  None recommended by OT    Recommendations for Other Services      Precautions / Restrictions Precautions Precautions: Shoulder Type of Shoulder Precautions: No shoulder movement allowed. Elbow, wrist and hand ROM ok Shoulder Interventions: Shoulder sling/immobilizer Required Braces or Orthoses: Sling Restrictions Weight Bearing Restrictions: Yes RUE Weight Bearing: Non weight bearing       Mobility Bed Mobility Overal bed mobility: Modified Independent                Transfers Overall transfer level: Needs assistance   Transfers: Sit to/from Stand Sit to Stand: Supervision         General transfer comment: supervision  for safety    Balance Overall balance assessment: Needs assistance Sitting-balance support: No upper extremity supported;Feet supported Sitting balance-Leahy Scale: Good     Standing balance support: No upper extremity supported;During functional activity Standing balance-Leahy Scale: Fair                             ADL either performed or assessed with clinical judgement   ADL Overall ADL's : Needs assistance/impaired                 Upper Body Dressing : Minimal assistance;Sitting;Standing Upper Body Dressing Details (indicate cue type and reason): to pull bra down back; VC's needed for sling sequencing Lower Body Dressing: Supervision/safety;Sit to/from stand Lower Body Dressing Details (indicate cue type and reason): standing to put on pajama shorts and underwear             Functional mobility during ADLs: Supervision/safety General ADL Comments: mild cognitive/psychosocial deficits noted     Vision Patient Visual Report: No change from baseline     Perception     Praxis      Cognition Arousal/Alertness: Awake/alert Behavior During Therapy: WFL for tasks assessed/performed Overall Cognitive Status: Impaired/Different from baseline Area of Impairment: Attention;Following commands;Safety/judgement;Awareness                   Current Attention Level: Selective   Following Commands: Follows multi-step commands with increased time Safety/Judgement: Decreased awareness of safety;Decreased awareness of deficits Awareness: Intellectual;Emergent   General Comments: increased time for processing; tangential and needing  cues, shows difficulty with problem solving        Exercises     Shoulder Instructions       General Comments      Pertinent Vitals/ Pain       Pain Assessment: Faces Faces Pain Scale: Hurts a little bit Pain Location: R shoulder Pain Descriptors / Indicators: Sore;Discomfort;Grimacing Pain Intervention(s):  Limited activity within patient's tolerance;Monitored during session  Home Living                                          Prior Functioning/Environment              Frequency  Min 2X/week        Progress Toward Goals  OT Goals(current goals can now be found in the care plan section)  Progress towards OT goals: Progressing toward goals  Acute Rehab OT Goals Patient Stated Goal: go home OT Goal Formulation: With patient Time For Goal Achievement: 07/07/19 Potential to Achieve Goals: Good  Plan Discharge plan remains appropriate    Co-evaluation                 AM-PAC OT "6 Clicks" Daily Activity     Outcome Measure   Help from another person eating meals?: None Help from another person taking care of personal grooming?: A Little Help from another person toileting, which includes using toliet, bedpan, or urinal?: A Little Help from another person bathing (including washing, rinsing, drying)?: A Little Help from another person to put on and taking off regular upper body clothing?: A Little Help from another person to put on and taking off regular lower body clothing?: None 6 Click Score: 20    End of Session Equipment Utilized During Treatment: Other (comment)(sling)  OT Visit Diagnosis: Muscle weakness (generalized) (M62.81);Pain;Other symptoms and signs involving cognitive function Pain - Right/Left: Right Pain - part of body: Shoulder   Activity Tolerance Patient tolerated treatment well   Patient Left in bed;with call bell/phone within reach   Nurse Communication Mobility status        Time: 9678-9381 OT Time Calculation (min): 19 min  Charges: OT General Charges $OT Visit: 1 Visit OT Treatments $Self Care/Home Management : 8-22 mins  Zenovia Jarred, MSOT, OTR/L [place] Office: [phone]   Zenovia Jarred 07/04/2019, 12:05 PM

## 2019-07-04 NOTE — Progress Notes (Signed)
Two PIV removed from LUE without difficulty. Discharge instructions reviewed with pt and a printed copy of instructions and paper Rx for pain meds provided for pt. Pt denies any questions or concerns and verablizes understanding to all instructions given. Pt discharged to home with family

## 2019-07-05 NOTE — Discharge Summary (Signed)
Physician Discharge Summary  Patient ID: Tina Mayo MRN: [id number] DOB/AGE: 49-Jul-1972 49 y.o.  Admit date: 06/29/2019 Discharge date: 07/04/2019  Admission Diagnoses:  Avascular necrosis of head of humerus [facility])  Discharge Diagnoses:  Principal Problem:   Avascular necrosis of head of humerus ([place]) Active Problems:   Thrombocytopenia, unspecified (HCC)   Sickle cell trait (HCC)   Fever postop acute respiratory failure with atelectasis ABLA on chronic, transfused 1 unit prbcs  Past Medical History:  Diagnosis Date  . Anxiety   . Asthma   . Avascular necrosis of head of humerus ([place]) 06/29/2019  . Bell's palsy   . Depression   . Sickle cell trait ([place])     Surgeries: Procedure(s): TOTAL SHOULDER ARTHROPLASTY on 06/29/2019   Consultants (if any):   Discharged Condition: Improved  Hospital Course: Tina Mayo is an 48 y.o. female who was admitted 06/29/2019 with a diagnosis of Avascular necrosis of head of humerus ([place]) and went to the operating room on 06/29/2019 and underwent the above named procedures.    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 First postop day was fairly stable. She had received a scalene block and general anesthesia. Since yesterday evening, patient is started having low-grade temperature, overnight she had some shortness of breath, needed 2 L of oxygen and had low-grade temperature, maximum 101.5. Patient herself complains of being sleepy, still has effects of the anesthesia as per patient. She states she walked with a therapist and has no problem. Her pain is controlled. Patient states dry cough, unable to get any sputum out. Also doing incentive spirometry, somehow limited with  pleuritic pain on the right lower chest. Denies any episodes of choking or aspiration. Chest x-ray shows bilateral lower lobe consolidation which is probably atelectasis.  As the morning went on, the patient became septic, hypoxic, and hypotensive. She is found to have HCAP with a WBC of 35.9 and a hemoglobin of 6.3. She has been transfused with 1 unit of blood. She was transferred to the ICU following a rapid response  Severe Sepsis: Resolved. Fever, tachycardia, hypotension, leukocytosis with evidence of multilobar pneumonia on CTA Chest and hypoxia. Blood cultures have had no growth x 3 days.. The patient has been moved to the ICU following a rapid response. She is receiving IV Cefepime and vancomycin and IV fluids. I will convert this to oral antibiotics. Blood cultures have been drawn and have had no growth.   HCAP: Concern for aspiration pneumonia/healthcare acquired pneumonia. She is receiving IV cefepime and vancomycin. I will convert these to oral antibiotics.  Blood cultures have had no growth.  Acute hypoxic respiratory failure: Resolved. The patient is saturating in the 90's on room air.   Postop fever and shortness of breath: Suspect bilateral atelectasis. No further fevers. Chest physiotherapy, incentive spirometry, deep breathing exercises, sputum induction, mucolytic's and bronchodilators. Blood cultures have had no growth.   Anemia: Probably acute on chronic. Hemoglobin down to 6.3 morning of 07/02/2019 down from 7.1. , She has been transfused with 1 unit PRBC's. Hemoglobin is up to 7.5. Platelets are up to 106. She is chronically thrombocytopenia in the range of 50-70.   She was given perioperative antibiotics:  Anti-infectives (From admission, onward)   Start     Dose/Rate Route Frequency  Ordered Stop   07/04/19 1200  doxycycline (VIBRA-TABS) tablet 100 mg  Status:  Discontinued     100 mg Oral Every 12 hours 07/04/19 0959 07/04/19 1441   07/04/19 0000  doxycycline  (VIBRA-TABS) 100 MG tablet     100 mg Oral Every 12 hours 07/04/19 1001     07/02/19 1800  vancomycin (VANCOCIN) IVPB 1000 mg/200 mL premix  Status:  Discontinued     1,000 mg 200 mL/hr over 60 Minutes Intravenous Every 12 hours 07/02/19 1121 07/04/19 1441   07/01/19 2100  vancomycin (VANCOCIN) IVPB 750 mg/150 ml premix  Status:  Discontinued     750 mg 150 mL/hr over 60 Minutes Intravenous Every 8 hours 07/01/19 1147 07/02/19 1121   07/01/19 1300  vancomycin (VANCOCIN) 1,750 mg in sodium chloride 0.9 % 500 mL IVPB     1,750 mg 250 mL/hr over 120 Minutes Intravenous  Once 07/01/19 1140 07/02/19 0700   07/01/19 1200  ceFEPIme (MAXIPIME) 2 g in sodium chloride 0.9 % 100 mL IVPB  Status:  Discontinued     2 g 200 mL/hr over 30 Minutes Intravenous Every 8 hours 07/01/19 1129 07/04/19 1441   06/30/19 0000  vancomycin (VANCOCIN) IVPB 1000 mg/200 mL premix     1,000 mg 200 mL/hr over 60 Minutes Intravenous Every 12 hours 06/29/19 1830 06/30/19 0157   06/29/19 1100  vancomycin (VANCOCIN) IVPB 1000 mg/200 mL premix     1,000 mg 200 mL/hr over 60 Minutes Intravenous  Once 06/29/19 1045 06/29/19 1351   06/29/19 1015  ceFAZolin (ANCEF) IVPB 2g/100 mL premix  Status:  Discontinued     2 g 200 mL/hr over 30 Minutes Intravenous On call to O.R. 06/29/19 1013 06/29/19 1046    .  She was given sequential compression devices, early ambulation, and lovenox for DVT prophylaxis.  She benefited maximally from the hospital stay and there were no complications.    Recent vital signs:  Vitals:   07/04/19 0200 07/04/19 0535  BP: 130/69 (!) 139/56  Pulse: (!) 112 (!) 119  Resp: (!) 22 20  Temp:  100.1 F (37.8 C)  SpO2: 94% 91%    Recent laboratory studies:  Lab Results  Component Value Date   HGB 7.5 (L) 07/04/2019   HGB 7.7 (L) 07/03/2019   HGB 7.7 (L) 07/02/2019   Lab Results  Component Value Date   WBC 29.4 (H) 07/04/2019   PLT 106 (L) 07/04/2019   Lab Results  Component Value Date    INR 1.15 12/14/2018   Lab Results  Component Value Date   NA 139 07/04/2019   K 3.8 07/04/2019   CL 106 07/04/2019   CO2 23 07/04/2019   BUN 7 07/04/2019   CREATININE 0.51 07/04/2019   GLUCOSE 110 (H) 07/04/2019    Discharge Medications:   Allergies as of 07/04/2019      Reactions   Levaquin [levofloxacin In D5w] Anaphylaxis   Penicillins Swelling   DID THE REACTION INVOLVE: Swelling of the face/tongue/throat, SOB, or low BP? Yes Sudden or severe rash/hives, skin peeling, or the inside of the mouth or nose? No Did it require medical treatment? Yes When did it last happen?8 years ago If all above answers are "NO", may proceed with cephalosporin use.      Medication List    STOP taking these medications   diclofenac sodium 1 % Gel Commonly known as: VOLTAREN   ibuprofen 800 MG tablet Commonly known as: ADVIL   meloxicam 15 MG tablet  Commonly known as: MOBIC     TAKE these medications   ARIPiprazole 10 MG tablet Commonly known as: ABILIFY Take 10 mg by mouth at bedtime.   baclofen 10 MG tablet Commonly known as: LIORESAL Take 1 tablet (10 mg total) by mouth 3 (three) times daily. As needed for muscle spasm   doxycycline 100 MG tablet Commonly known as: VIBRA-TABS Take 1 tablet (100 mg total) by mouth every 12 (twelve) hours.   guaiFENesin 100 MG/5ML Soln Commonly known as: ROBITUSSIN Take 10 mLs (200 mg total) by mouth every 6 (six) hours as needed for cough or to loosen phlegm.   Lidocaine 4 % Ptch Place 1 patch onto the skin daily as needed (pain).   menthol-cetylpyridinium 3 MG lozenge Commonly known as: CEPACOL Take 1 lozenge (3 mg total) by mouth as needed for sore throat (sore throat).   mupirocin ointment 2 % Commonly known as: Bactroban Apply 1 application topically 3 (three) times daily.   Muscle Rub 10-15 % Crea Apply 1 application topically as needed for muscle pain.   ondansetron 4 MG tablet Commonly known as: Zofran Take 1 tablet  (4 mg total) by mouth every 8 (eight) hours as needed for nausea or vomiting.   OVER THE COUNTER MEDICATION Take 700 mg by mouth 3 (three) times daily as needed (pain). CBD Gummies 350 mg per gummy   oxyCODONE-acetaminophen 5-325 MG tablet Commonly known as: Percocet Take 1-2 tablets by mouth every 4 (four) hours as needed for up to 5 days for moderate pain or severe pain.   phenol 1.4 % Liqd Commonly known as: CHLORASEPTIC Use as directed 1 spray in the mouth or throat as needed for throat irritation / pain.   sennosides-docusate sodium 8.6-50 MG tablet Commonly known as: SENOKOT-S Take 2 tablets by mouth daily. What changed:   how much to take  when to take this  reasons to take this   sertraline 50 MG tablet Commonly known as: ZOLOFT Take 50 mg by mouth at bedtime.   triamcinolone cream 0.1 % Commonly known as: KENALOG Apply 1 application topically 2 (two) times daily. Apply for 2 weeks as needed for itching       Diagnostic Studies: Dg Chest 2 View  Result Date: 07/01/2019 CLINICAL DATA:  Shortness of breath.  Recent surgery EXAM: CHEST - 2 VIEW COMPARISON:  06/03/2018 chest CT FINDINGS: Extensive opacification in the lower chest which could be aspiration or atelectasis. Normal heart size. No effusion or pneumothorax. Interval right glenohumeral arthroplasty. Avascular necrosis of the left humeral head IMPRESSION: Extensive lower chest opacification with postoperative history suggesting atelectasis or aspiration/pneumonia. Electronically Signed   By: Monte Fantasia M.D.   On: 07/01/2019 05:50   Ct Angio Chest Pe W Or Wo Contrast  Result Date: 07/01/2019 CLINICAL DATA:  PE suspected, recent surgery EXAM: CT ANGIOGRAPHY CHEST WITH CONTRAST TECHNIQUE: Multidetector CT imaging of the chest was performed using the standard protocol during bolus administration of intravenous contrast. Multiplanar CT image reconstructions and MIPs were obtained to evaluate the vascular anatomy.  CONTRAST:  2mL OMNIPAQUE IOHEXOL 350 MG/ML SOLN COMPARISON:  None. FINDINGS: Cardiovascular: Satisfactory opacification of the pulmonary arteries to the segmental level. No evidence of pulmonary embolism. Normal heart size. No pericardial effusion. Mediastinum/Nodes: No enlarged mediastinal, hilar, or axillary lymph nodes. Thyroid gland, trachea, and esophagus demonstrate no significant findings. Lungs/Pleura: There is extensive, right greater than left dependent bibasilar heterogeneous airspace disease and consolidation, with near-total consolidation of the right lower lobe and  extensive involvement of the right middle lobe and left lung base. Upper Abdomen: No acute abnormality. Musculoskeletal: Postoperative findings of recent right shoulder arthroplasty. Review of the MIP images confirms the above findings. IMPRESSION: 1.  Negative examination for pulmonary embolism. 2. There is extensive, right greater than left dependent bibasilar heterogeneous airspace disease and consolidation, with near-total consolidation of the right lower lobe and extensive involvement of the right middle lobe and left lung base. Findings are consistent with extensive multifocal infection and/or aspiration. Electronically Signed   By: Eddie Candle M.D.   On: 07/01/2019 14:50   Dg Shoulder Right Port  Result Date: 06/29/2019 CLINICAL DATA:  Status post right shoulder replacement EXAM: PORTABLE RIGHT SHOULDER COMPARISON:  None. FINDINGS: Right shoulder prosthesis is noted in satisfactory position. No acute bony abnormality is seen. IMPRESSION: Status post right shoulder replacement Electronically Signed   By: Inez Catalina M.D.   On: 06/29/2019 18:58    Disposition:     Follow-up Information    Marchia Bond, MD. Schedule an appointment as soon as possible for a visit in 2 weeks.   Specialty: Orthopedic Surgery Contact information: [address] [place] [address] [phone]             Signed: Johnny Bridge 07/05/2019, 8:39 AM

## 2019-07-06 LAB — CULTURE, BLOOD (ROUTINE X 2)
Culture: NO GROWTH
Culture: NO GROWTH
Special Requests: ADEQUATE
Special Requests: ADEQUATE

## 2019-08-09 ENCOUNTER — Encounter

## 2019-08-12 ENCOUNTER — Other Ambulatory Visit: Payer: Self-pay

## 2019-08-12 ENCOUNTER — Ambulatory Visit: Payer: Medicaid Other | Attending: Family Medicine | Admitting: Physical Therapy

## 2019-08-12 ENCOUNTER — Encounter: Payer: Self-pay | Admitting: Physical Therapy

## 2019-08-12 DIAGNOSIS — Z96611 Presence of right artificial shoulder joint: Secondary | ICD-10-CM | POA: Insufficient documentation

## 2019-08-12 DIAGNOSIS — G8929 Other chronic pain: Secondary | ICD-10-CM | POA: Diagnosis present

## 2019-08-12 DIAGNOSIS — M25511 Pain in right shoulder: Secondary | ICD-10-CM | POA: Insufficient documentation

## 2019-08-12 DIAGNOSIS — R600 Localized edema: Secondary | ICD-10-CM | POA: Insufficient documentation

## 2019-08-12 DIAGNOSIS — M6281 Muscle weakness (generalized): Secondary | ICD-10-CM | POA: Insufficient documentation

## 2019-08-12 NOTE — Therapy (Signed)
[place], [place], [address] Phone: [phone]   Fax:  [phone]  Physical Therapy Evaluation  Patient Details  Name: Tina Mayo MRN: [id number] Date of Birth: 06-30-1971 Referring Provider (PT): Marchia Bond MD   Encounter Date: 08/12/2019  PT End of Session - 08/12/19 1426    Visit Number  1    Number of Visits  17    Date for PT Re-Evaluation  10/07/19    Authorization Type  MCD: resubmit at 4th visit.    PT Start Time  1410    PT Stop Time  1453    PT Time Calculation (min)  43 min    Activity Tolerance  Patient tolerated treatment well    Behavior During Therapy  WFL for tasks assessed/performed       Past Medical History:  Diagnosis Date  . Anxiety   . Asthma   . Avascular necrosis of head of humerus ([place]) 06/29/2019  . Bell's palsy   . Depression   . Sickle cell trait [facility])     Past Surgical History:  Procedure Laterality Date  . benign breast tumor removed     2006  . BREAST BIOPSY Right 2014   benign  . BREAST EXCISIONAL BIOPSY Right 2007   benign   . CHOLECYSTECTOMY N/A 12/16/2018   Procedure: LAPAROSCOPIC CHOLECYSTECTOMY;  Surgeon: Clovis Riley, MD;  Location: [place];  Service: General;  Laterality: N/A;  . right ankle surgery     2010  . TOTAL SHOULDER ARTHROPLASTY Right 06/29/2019   Procedure: TOTAL SHOULDER ARTHROPLASTY;  Surgeon: Marchia Bond, MD;  Location[facility];  Service: Orthopedics;  Laterality: Right;  . TUBAL LIGATION      There were no vitals filed for this visit.   Subjective Assessment - 08/12/19 1417    Subjective  pt is a 48 s/p R total shoulder on 06/29/2019. Since the surgery she reports she feels she is doing pretty good aside from having increased appetite. She does report having pain burning in the shoulder which she has been using neosporin on the R shoulder.    Limitations  Lifting    How long can you sit comfortably?  unlimited     How long can you stand comfortably?  unlmited    How long can you walk comfortably?  unlimited    Diagnostic tests  x-ray before surgery    Patient Stated Goals  to get more function in the R shoulder,    Currently in Pain?  Yes    Pain Score  9    she reports due it to be cold   Pain Location  Shoulder    Pain Orientation  Right;Lateral    Pain Descriptors / Indicators  Aching;Burning    Pain Type  Surgical pain    Pain Onset  More than a month ago    Pain Frequency  Constant    Aggravating Factors   cold, general movement    Pain Relieving Factors  medication,    Effect of Pain on Daily Activities  limited use of the RUE         [facility] PT Assessment - 08/12/19 0001      Assessment   Medical Diagnosis  S/p R total shoulder    Referring Provider (PT)  Marchia Bond MD    Onset Date/Surgical Date  06/29/19    Hand Dominance  Right    Next MD Visit  unsure  Prior Therapy  no      Precautions   Precautions  Shoulder      Restrictions   Weight Bearing Restrictions  No      Balance Screen   Has the patient fallen in the past 6 months  No      [place] residence      Observation/Other Assessments   Quick DASH   79.55      Posture/Postural Control   Posture/Postural Control  Postural limitations    Postural Limitations  Rounded Shoulders;Forward head      ROM / Strength   AROM / PROM / Strength  AROM;PROM;Strength      AROM   AROM Assessment Site  Shoulder    Right/Left Shoulder  Right;Left    Right Shoulder Extension  45 Degrees    Right Shoulder Flexion  78 Degrees   with significant shoulder hiking   Right Shoulder ABduction  55 Degrees   with significant shoulder hiking   Right Shoulder Internal Rotation  --   R SIJ   Right Shoulder External Rotation  --   C3    Left Shoulder Extension  55 Degrees    Left Shoulder Flexion  160 Degrees    Left Shoulder ABduction  144 Degrees    Left Shoulder Internal Rotation  --    T7   Left Shoulder External Rotation  --   T5     PROM   PROM Assessment Site  Shoulder    Right/Left Shoulder  Right    Right Shoulder Flexion  88 Degrees    Right Shoulder ABduction  78 Degrees      Strength   Strength Assessment Site  Shoulder;Hand    Right/Left Shoulder  Right;Left    Right Shoulder Flexion  2+/5    Right Shoulder Extension  2+/5    Right Shoulder ABduction  2+/5    Right Shoulder Internal Rotation  2+/5    Right Shoulder External Rotation  2+/5    Left Shoulder Flexion  4+/5    Left Shoulder Extension  4+/5    Left Shoulder ABduction  4+/5    Left Shoulder Internal Rotation  4+/5    Left Shoulder External Rotation  4-/5    Right Hand Grip (lbs)  58   60,60,54   Left Hand Grip (lbs)  53   59,55,45     Palpation   Palpation comment  TTP along the proximal bicep/ tricep, along the pec major/ minor, and mulitple trigger points in the upper trap              Quick Dash - 08/12/19 0001    Open a tight or new jar  Severe difficulty    Do heavy household chores (wash walls, wash floors)  Severe difficulty    Carry a shopping bag or briefcase  Severe difficulty    Wash your back  Severe difficulty    Use a knife to cut food  Severe difficulty    Recreational activities in which you take some force or impact through your arm, shoulder, or hand (golf, hammering, tennis)  Severe difficulty    During the past week, to what extent has your arm, shoulder or hand problem interfered with your normal social activities with family, friends, neighbors, or groups?  Quite a bit    During the past week, to what extent has your arm, shoulder or hand problem limited your work or other regular daily  activities  Quite a bit    Arm, shoulder, or hand pain.  Severe    Tingling (pins and needles) in your arm, shoulder, or hand  Extreme    Difficulty Sleeping  So much difficuSo much difficulty, I can't sleep    DASH Score  79.55 %        Objective measurements  completed on examination: See above findings.      [place] Adult PT Treatment/Exercise - 08/12/19 0001      Exercises   Exercises  Shoulder      Shoulder Exercises: ROM/Strengthening   Other ROM/Strengthening Exercises  wand flexion/ abduction 1 x 10 ea.    verbal cues for form     Shoulder Exercises: Isometric Strengthening   Flexion  5X10"    Extension  5X10"    External Rotation  5X10"    Internal Rotation  5X10"             PT Education - 08/12/19 1445    Education Details  evaluatoin findings, POC, goals, HEP with form/ rationale    Person(s) Educated  Patient    Methods  Explanation;Verbal cues;Handout    Comprehension  Verbalized understanding;Verbal cues required       PT Short Term Goals - 08/12/19 1500      PT SHORT TERM GOAL #1   Title  pt to be I with inital HEP    Baseline  only doing bicep curl per MD instructions    Time  3    Period  Weeks    Status  New    Target Date  09/02/19      PT SHORT TERM GOAL #2   Title  reduce pain and inflammation in the R shoulder via RICE and HEP    Baseline  uses medication to modulte pain    Time  3    Period  Weeks    Status  New    Target Date  09/02/19        PT Long Term Goals - 08/12/19 1501      PT LONG TERM GOAL #1   Title  increase R shoulder flexion/ abduction to >/= 120 degrees to maximize shoulder function work Sport and exercise psychologist and potential work related activities with </= 2/10 pain    Baseline  R shoulder flexion 78 degrees abduction 55 AROM    Time  8    Period  Weeks    Status  New    Target Date  10/07/19      PT LONG TERM GOAL #2   Title  increaes shoulder IR/ ER to [facility] compared bil with </= 2/10 pain for donning/ doffing clothing, personal hygiene and ADLs    Baseline  IR R SIJ and ER to C3 with 8/10 pain    Time  8    Period  Weeks    Status  New    Target Date  10/07/19      PT LONG TERM GOAL #3   Title  pt to be able to lift and lower >/= 10# to and from an elevated shoulder and push/  pull >/= 20# with </=2/10 pain for functional strength    Baseline  unable to lift any weight in the shoulder    Time  8    Period  Weeks    Status  New    Target Date  10/07/19      PT LONG TERM GOAL #4   Title  increaes QuickDash score to </=  40 to demo improvement in shoulder function    Baseline  inital score 79.55    Time  8    Period  Weeks    Status  New    Target Date  10/07/19      PT LONG TERM GOAL #5   Title  pt to be I with all HEP given as of last visit to maintain and progress current level of function    Baseline  only doing MD prescribed bicep curl    Time  8    Period  Weeks    Status  New    Target Date  10/07/19             Plan - 08/12/19 1455    Clinical Impression Statement  pt presents to OPPT s/p R shoulder arthroplasty on 06/29/2019. She demonstrates limited AROM with significant shoulder hikin gnoted and limtied PROM secondary to pain and guarding in the L shoulder, General shoulder soreness noted with increased TTP along the proximal bicep/ tricep and pec major. She would benefit from physical therapy to decrease R shoulder pain, increase shoulder ROM and strength and maximize her function by addressing the deficits listed.    Personal Factors and Comorbidities  Comorbidity 3+    Comorbidities  hx or Anxiety/ depression and sickle cell trait    Examination-Activity Limitations  Lift;Reach Overhead;Self Feeding    Examination-Participation Restrictions  Meal Prep;Laundry    Stability/Clinical Decision Making  Evolving/Moderate complexity    Clinical Decision Making  Moderate    Rehab Potential  Good    PT Frequency  2x / week    PT Duration  8 weeks   initial MCD auth 3 visits in authorization period   PT Treatment/Interventions  ADLs/Self Care Home Management;Electrical Stimulation;Cryotherapy;Iontophoresis 4mg /ml Dexamethasone;Moist Heat;Ultrasound;Therapeutic activities;Therapeutic exercise;Neuromuscular re-education;Patient/family  education;Manual techniques;Passive range of motion;Taping;Vasopneumatic Device    PT Next Visit Plan  review/ update HEP, shoulder mobs/ PROM working into [place], progress to isotonics as able. prefers heat vs cold    PT Home Exercise Plan  isometrics IR/ER flexion/ extension, upper trap stretch, wand flexion/ abduction    Consulted and Agree with Plan of Care  Patient       Patient will benefit from skilled therapeutic intervention in order to improve the following deficits and impairments:  Improper body mechanics, Increased muscle spasms, Decreased strength, Pain, Impaired UE functional use, Decreased endurance, Decreased scar mobility, Decreased activity tolerance, Postural dysfunction, Decreased range of motion, Increased edema  Visit Diagnosis: H/O total shoulder replacement, right  Chronic right shoulder pain  Muscle weakness (generalized)  Localized edema     Problem List Patient Active Problem List   Diagnosis Date Noted  . Fever postop 07/01/2019  . Avascular necrosis of head of humerus ([place]) 06/29/2019  . Constipation due to slow transit 12/18/2018  . Constipation   . Choledocholithiasis 12/14/2018  . RUQ abdominal pain   . Trichimoniasis 05/03/2015  . Pap smear for cervical cancer screening 05/02/2015  . Perimenopausal 05/02/2015  . Vaginal discharge 05/02/2015  . Bell's palsy 02/28/2015  . Insomnia 12/26/2013  . Cystic breast 12/26/2013  . Tobacco abuse 09/21/2013  . Chest pain 08/26/2013  . Thrombocytopenia, unspecified ([place]) 08/26/2013  . Cardiomegaly 08/26/2013  . Sickle cell trait ([place]) 08/26/2013  . Abnormal CT of the chest 08/26/2013  . Depression 08/26/2013  . Anemia, unspecified 08/26/2013   Starr Lake PT, DPT, LAT, ATC  08/12/19  3:13 PM      [facility]  [place], [place], [address] Phone: [phone]   Fax:  [phone]  Name: Tina Mayo MRN:  [id number] Date of Birth: [birth year]

## 2019-08-19 ENCOUNTER — Ambulatory Visit: Payer: Medicaid Other | Admitting: Physical Therapy

## 2019-08-23 ENCOUNTER — Encounter: Payer: Self-pay | Admitting: Physical Therapy

## 2019-08-23 ENCOUNTER — Other Ambulatory Visit: Payer: Self-pay

## 2019-08-23 ENCOUNTER — Ambulatory Visit: Payer: Medicaid Other | Admitting: Physical Therapy

## 2019-08-23 DIAGNOSIS — R600 Localized edema: Secondary | ICD-10-CM

## 2019-08-23 DIAGNOSIS — Z96611 Presence of right artificial shoulder joint: Secondary | ICD-10-CM

## 2019-08-23 DIAGNOSIS — M6281 Muscle weakness (generalized): Secondary | ICD-10-CM

## 2019-08-23 DIAGNOSIS — G8929 Other chronic pain: Secondary | ICD-10-CM

## 2019-08-23 NOTE — Therapy (Signed)
[place], [place], [address] Phone: [phone]   Fax:  [phone]  Physical Therapy Treatment  Patient Details  Name: Tina Mayo MRN: [id number] Date of Birth: 08-13-1971 Referring Provider (PT): Marchia Bond MD   Encounter Date: 08/23/2019  PT End of Session - 08/23/19 1313    Visit Number  2    Number of Visits  17    Date for PT Re-Evaluation  10/07/19    Authorization Type  MCD: resubmit at 4th visit.    Authorization Time Period  9/9 to 9/22    Authorization - Visit Number  1    Authorization - Number of Visits  3    PT Start Time  1229    PT Stop Time  [id number]    PT Time Calculation (min)  48 min    Activity Tolerance  Patient tolerated treatment well    Behavior During Therapy  [facility] for tasks assessed/performed       Past Medical History:  Diagnosis Date  . Anxiety   . Asthma   . Avascular necrosis of head of humerus ([place]) 06/29/2019  . Bell's palsy   . Depression   . Sickle cell trait [facility])     Past Surgical History:  Procedure Laterality Date  . benign breast tumor removed     2006  . BREAST BIOPSY Right 2014   benign  . BREAST EXCISIONAL BIOPSY Right 2007   benign   . CHOLECYSTECTOMY N/A 12/16/2018   Procedure: LAPAROSCOPIC CHOLECYSTECTOMY;  Surgeon: Clovis Riley, MD;  Location: [place];  Service: General;  Laterality: N/A;  . right ankle surgery     2010  . TOTAL SHOULDER ARTHROPLASTY Right 06/29/2019   Procedure: TOTAL SHOULDER ARTHROPLASTY;  Surgeon: Marchia Bond, MD;  [facility];  Service: Orthopedics;  Laterality: Right;  . TUBAL LIGATION      There were no vitals filed for this visit.  Subjective Assessment - 08/23/19 1234    Subjective  my arm is itching bad feel like my shoulder is tight.  I like the one with the broom.  took meds. no pain right    Currently in Pain?  Yes    Pain Score  7     Pain Location  Shoulder    Pain Orientation  Right    Pain Descriptors / Indicators  Aching    Pain Type  Surgical pain    Pain Onset  More than a month ago    Pain Frequency  Constant    Aggravating Factors   general mvmt    Pain Relieving Factors  meds, heat               [facility] Adult PT Treatment/Exercise - 08/23/19 0001      Self-Care   Self-Care  Other Self-Care Comments    Other Self-Care Comments   protocol, HEp, safety      Shoulder Exercises: Standing   Flexion  AAROM;Right;15 reps    Shoulder Flexion Weight (lbs)  cane     ABduction  AAROM;Right;15 reps    ABduction Limitations  cane       Shoulder Exercises: Pulleys   Flexion  3 minutes      Shoulder Exercises: ROM/Strengthening   Pendulum  A/P, M/L about 2 min       Shoulder Exercises: Isometric Strengthening   Flexion  5X5"    Extension  5X5"    External Rotation  5X5"  Internal Rotation  5X5"      Modalities   Modalities  Moist Heat      Moist Heat Therapy   Number Minutes Moist Heat  10 Minutes    Moist Heat Location  Shoulder      Manual Therapy   Manual Therapy  Joint mobilization;Soft tissue mobilization;Passive ROM    Joint Mobilization  Light inferior glide     Passive ROM  all planes              PT Education - 08/23/19 1312    Education Details  protocol, guidance with HEP, positioning    Person(s) Educated  Patient    Methods  Explanation;Handout    Comprehension  Verbalized understanding;Returned demonstration       PT Short Term Goals - 08/23/19 1313      PT SHORT TERM GOAL #1   Title  pt to be I with inital HEP    Status  On-going      PT SHORT TERM GOAL #2   Title  reduce pain and inflammation in the R shoulder via RICE and HEP    Status  On-going        PT Long Term Goals - 08/12/19 1501      PT LONG TERM GOAL #1   Title  increase R shoulder flexion/ abduction to >/= 120 degrees to maximize shoulder function work Sport and exercise psychologist and potential work related activities with </= 2/10 pain    Baseline  R shoulder flexion  78 degrees abduction 55 AROM    Time  8    Period  Weeks    Status  New    Target Date  10/07/19      PT LONG TERM GOAL #2   Title  increaes shoulder IR/ ER to [facility] compared bil with </= 2/10 pain for donning/ doffing clothing, personal hygiene and ADLs    Baseline  IR R SIJ and ER to C3 with 8/10 pain    Time  8    Period  Weeks    Status  New    Target Date  10/07/19      PT LONG TERM GOAL #3   Title  pt to be able to lift and lower >/= 10# to and from an elevated shoulder and push/ pull >/= 20# with </=2/10 pain for functional strength    Baseline  unable to lift any weight in the shoulder    Time  8    Period  Weeks    Status  New    Target Date  10/07/19      PT LONG TERM GOAL #4   Title  increaes QuickDash score to </= 40 to demo improvement in shoulder function    Baseline  inital score 79.55    Time  8    Period  Weeks    Status  New    Target Date  10/07/19      PT LONG TERM GOAL #5   Title  pt to be I with all HEP given as of last visit to maintain and progress current level of function    Baseline  only doing MD prescribed bicep curl    Time  8    Period  Weeks    Status  New    Target Date  10/07/19            Plan - 08/23/19 1313    Clinical Impression Statement  Pt with lack of knowledge regarding protocol,  ability to only perform AAROM vs AROM or weights.  Needed max cues for HEP.  Given alternatives for better form, less guarding in Rt UE.  PROM in flex, elevation 90 deg, IR 70 and ER 45 deg in scap plane.    PT Treatment/Interventions  ADLs/Self Care Home Management;Electrical Stimulation;Cryotherapy;Iontophoresis 4mg /ml Dexamethasone;Moist Heat;Ultrasound;Therapeutic activities;Therapeutic exercise;Neuromuscular re-education;Patient/family education;Manual techniques;Passive range of motion;Taping;Vasopneumatic Device    PT Next Visit Plan  manual shoulder mobs, AAROM, modalities for pain    PT Home Exercise Plan  isometrics IR/ER flexion/ extension,  upper trap stretch, wand flexion/ abduction, pendulum, supine flexion and ER    Consulted and Agree with Plan of Care  Patient       Patient will benefit from skilled therapeutic intervention in order to improve the following deficits and impairments:  Improper body mechanics, Increased muscle spasms, Decreased strength, Pain, Impaired UE functional use, Decreased endurance, Decreased scar mobility, Decreased activity tolerance, Postural dysfunction, Decreased range of motion, Increased edema  Visit Diagnosis: H/O total shoulder replacement, right  Chronic right shoulder pain  Muscle weakness (generalized)  Localized edema     Problem List Patient Active Problem List   Diagnosis Date Noted  . Fever postop 07/01/2019  . Avascular necrosis of head of humerus ([place]) 06/29/2019  . Constipation due to slow transit 12/18/2018  . Constipation   . Choledocholithiasis 12/14/2018  . RUQ abdominal pain   . Trichimoniasis 05/03/2015  . Pap smear for cervical cancer screening 05/02/2015  . Perimenopausal 05/02/2015  . Vaginal discharge 05/02/2015  . Bell's palsy 02/28/2015  . Insomnia 12/26/2013  . Cystic breast 12/26/2013  . Tobacco abuse 09/21/2013  . Chest pain 08/26/2013  . Thrombocytopenia, unspecified ([place]) 08/26/2013  . Cardiomegaly 08/26/2013  . Sickle cell trait ([place]) 08/26/2013  . Abnormal CT of the chest 08/26/2013  . Depression 08/26/2013  . Anemia, unspecified 08/26/2013    Tina Mayo 08/23/2019, 2:47 PM  [facility] [address] [place], [place], [address] Phone: [phone]   Fax:  [phone]  Name: Tina Mayo MRN: [id number] Date of Birth: 1971/05/24  Raeford Razor, PT 08/23/19 2:47 PM Phone: [phone] Fax: [phone]

## 2019-08-26 ENCOUNTER — Other Ambulatory Visit: Payer: Self-pay

## 2019-08-26 ENCOUNTER — Ambulatory Visit: Payer: Medicaid Other | Admitting: Physical Therapy

## 2019-08-26 DIAGNOSIS — M6281 Muscle weakness (generalized): Secondary | ICD-10-CM

## 2019-08-26 DIAGNOSIS — Z96611 Presence of right artificial shoulder joint: Secondary | ICD-10-CM

## 2019-08-26 DIAGNOSIS — R600 Localized edema: Secondary | ICD-10-CM

## 2019-08-26 DIAGNOSIS — G8929 Other chronic pain: Secondary | ICD-10-CM

## 2019-08-26 NOTE — Therapy (Signed)
[place], [place], [address] Phone: [phone]   Fax:  [phone]  Physical Therapy Treatment  Patient Details  Name: Tina Mayo MRN: [id number] Date of Birth: 11-01-1971 Referring Provider (PT): Marchia Bond MD   Encounter Date: 08/26/2019  PT End of Session - 08/26/19 1715    Visit Number  3    Number of Visits  17    Date for PT Re-Evaluation  10/07/19    Authorization Type  MCD: resubmit at 4th visit.    Authorization Time Period  9/9 to 9/22    Authorization - Visit Number  2    Authorization - Number of Visits  3    PT Start Time  1707    PT Stop Time  1755    PT Time Calculation (min)  48 min    Activity Tolerance  Patient tolerated treatment well    Behavior During Therapy  [facility] for tasks assessed/performed       Past Medical History:  Diagnosis Date  . Anxiety   . Asthma   . Avascular necrosis of head of humerus ([place]) 06/29/2019  . Bell's palsy   . Depression   . Sickle cell trait [facility])     Past Surgical History:  Procedure Laterality Date  . benign breast tumor removed     2006  . BREAST BIOPSY Right 2014   benign  . BREAST EXCISIONAL BIOPSY Right 2007   benign   . CHOLECYSTECTOMY N/A 12/16/2018   Procedure: LAPAROSCOPIC CHOLECYSTECTOMY;  Surgeon: Clovis Riley, MD;  Location: [place];  Service: General;  Laterality: N/A;  . right ankle surgery     2010  . TOTAL SHOULDER ARTHROPLASTY Right 06/29/2019   Procedure: TOTAL SHOULDER ARTHROPLASTY;  Surgeon: Marchia Bond, MD;  Location: [facility];  Service: Orthopedics;  Laterality: Right;  . TUBAL LIGATION      There were no vitals filed for this visit.  Subjective Assessment - 08/26/19 1710    Subjective  Pt. comes in rating right shoulder pain 9/10 with more soreness due to rainy weather. She reports wand AAROM exercise from HEP helped her raise her arm enough to help fix her hair.    Currently in Pain?  Yes    Pain  Score  9     Pain Location  Shoulder    Pain Orientation  Right    Pain Descriptors / Indicators  Aching    Pain Type  Surgical pain    Pain Onset  More than a month ago    Pain Frequency  Constant    Aggravating Factors   movement    Pain Relieving Factors  medication and heat    Effect of Pain on Daily Activities  limited use of RUE for ADLs                       [facility] Adult PT Treatment/Exercise - 08/26/19 0001      Elbow Exercises   Elbow Flexion  --   tricep ext red band x 15 reps, bicep curl 2 lbs. x 15 reps     Shoulder Exercises: Supine   Flexion  AROM;Right;20 reps    Other Supine Exercises  supine wand ER x 10 for HEP review, supine wand AAROM flexion 2x10      Shoulder Exercises: Standing   External Rotation  AROM;Strengthening;Right;15 reps    Theraband Level (Shoulder External Rotation)  Level 1 (Yellow)  Internal Rotation  AROM;Strengthening;Right;15 reps    Theraband Level (Shoulder Internal Rotation)  Level 2 (Red)    Flexion  --   performed in supine   ABduction  AROM;Right;20 reps    ABduction Limitations  scaption AAROM with wand    Extension  AROM;Strengthening;Both;15 reps    Theraband Level (Shoulder Extension)  Level 2 (Red)    Row  AROM;Strengthening;Both;15 reps      Shoulder Exercises: Pulleys   Flexion  2 minutes      Shoulder Exercises: Stretch   Other Shoulder Stretches  cross body horiz. adduction stretch 20 sec x 3,     Other Shoulder Stretches  IR stretch behind back with left UE assist using small towel (left UE reaching in IR behind back to support right) 20 sec x 3      Moist Heat Therapy   Number Minutes Moist Heat  5 Minutes   pre-tx. per pt. request   Moist Heat Location  Shoulder      Manual Therapy   Passive ROM  all planes             PT Education - 08/26/19 1759    Education Details  HEP/protocol guidance, POC, sleep positioning    Person(s) Educated  Patient    Methods  Explanation;Demonstration     Comprehension  Verbalized understanding;Returned demonstration       PT Short Term Goals - 08/23/19 1313      PT SHORT TERM GOAL #1   Title  pt to be I with inital HEP    Status  On-going      PT SHORT TERM GOAL #2   Title  reduce pain and inflammation in the R shoulder via RICE and HEP    Status  On-going        PT Long Term Goals - 08/12/19 1501      PT LONG TERM GOAL #1   Title  increase R shoulder flexion/ abduction to >/= 120 degrees to maximize shoulder function work Sport and exercise psychologist and potential work related activities with </= 2/10 pain    Baseline  R shoulder flexion 78 degrees abduction 55 AROM    Time  8    Period  Weeks    Status  New    Target Date  10/07/19      PT LONG TERM GOAL #2   Title  increaes shoulder IR/ ER to [facility] compared bil with </= 2/10 pain for donning/ doffing clothing, personal hygiene and ADLs    Baseline  IR R SIJ and ER to C3 with 8/10 pain    Time  8    Period  Weeks    Status  New    Target Date  10/07/19      PT LONG TERM GOAL #3   Title  pt to be able to lift and lower >/= 10# to and from an elevated shoulder and push/ pull >/= 20# with </=2/10 pain for functional strength    Baseline  unable to lift any weight in the shoulder    Time  8    Period  Weeks    Status  New    Target Date  10/07/19      PT LONG TERM GOAL #4   Title  increaes QuickDash score to </= 40 to demo improvement in shoulder function    Baseline  inital score 79.55    Time  8    Period  Weeks    Status  New  Target Date  10/07/19      PT LONG TERM GOAL #5   Title  pt to be I with all HEP given as of last visit to maintain and progress current level of function    Baseline  only doing MD prescribed bicep curl    Time  8    Period  Weeks    Status  New    Target Date  10/07/19            Plan - 08/26/19 1800    Clinical Impression Statement  Able to progress to light theraband exercises from isometrics as noted per flowsheet. Still too weak for standing  AROM for flexion and abduction due to weakness but tolerates/performs well in supine for AROM/AAROM. Pt. will need continued therapy progression to further assist return to RUE use for ADLs.    Personal Factors and Comorbidities  Comorbidity 3+    Comorbidities  hx or Anxiety/ depression and sickle cell trait    Examination-Activity Limitations  Lift;Reach Overhead;Self Feeding    Examination-Participation Restrictions  Meal Prep;Laundry    Stability/Clinical Decision Making  Evolving/Moderate complexity    Clinical Decision Making  Moderate    Rehab Potential  Good    PT Frequency  2x / week    PT Duration  8 weeks    PT Treatment/Interventions  ADLs/Self Care Home Management;Electrical Stimulation;Cryotherapy;Iontophoresis 4mg /ml Dexamethasone;Moist Heat;Ultrasound;Therapeutic activities;Therapeutic exercise;Neuromuscular re-education;Patient/family education;Manual techniques;Passive range of motion;Taping;Vasopneumatic Device    PT Next Visit Plan  Continue shoulder AAROM/AROM and strengthening progression as tolerated, manual/PROM for tightness as needed, will need ERO next visit    PT Home Exercise Plan  isometrics IR/ER flexion/ extension, upper trap stretch, wand flexion/ abduction, pendulum, supine flexion and ER    Consulted and Agree with Plan of Care  Patient       Patient will benefit from skilled therapeutic intervention in order to improve the following deficits and impairments:  Improper body mechanics, Increased muscle spasms, Decreased strength, Pain, Impaired UE functional use, Decreased endurance, Decreased scar mobility, Decreased activity tolerance, Postural dysfunction, Decreased range of motion, Increased edema  Visit Diagnosis: H/O total shoulder replacement, right  Chronic right shoulder pain  Muscle weakness (generalized)  Localized edema     Problem List Patient Active Problem List   Diagnosis Date Noted  . Fever postop 07/01/2019  . Avascular necrosis  of head of humerus ([place]) 06/29/2019  . Constipation due to slow transit 12/18/2018  . Constipation   . Choledocholithiasis 12/14/2018  . RUQ abdominal pain   . Trichimoniasis 05/03/2015  . Pap smear for cervical cancer screening 05/02/2015  . Perimenopausal 05/02/2015  . Vaginal discharge 05/02/2015  . Bell's palsy 02/28/2015  . Insomnia 12/26/2013  . Cystic breast 12/26/2013  . Tobacco abuse 09/21/2013  . Chest pain 08/26/2013  . Thrombocytopenia, unspecified ([place]) 08/26/2013  . Cardiomegaly 08/26/2013  . Sickle cell trait ([place]) 08/26/2013  . Abnormal CT of the chest 08/26/2013  . Depression 08/26/2013  . Anemia, unspecified 08/26/2013   Beaulah Dinning, PT, DPT 08/26/19 6:04 PM  [facility] [address] [place], [place], [address] Phone: [phone]   Fax:  [phone]  Name: Madilee Gronau MRN: [id number] Date of Birth: 15-Nov-1971

## 2019-08-27 ENCOUNTER — Encounter

## 2019-09-07 ENCOUNTER — Telehealth: Payer: Self-pay | Admitting: Physical Therapy

## 2019-09-07 ENCOUNTER — Ambulatory Visit: Payer: Medicaid Other | Admitting: Physical Therapy

## 2019-09-07 NOTE — Telephone Encounter (Signed)
Spoke with patient about missing todays appointment.  She said she thought it was Thursday as she didn't receive a reminder call.  Patient was rescheduled for Thursday

## 2019-09-09 ENCOUNTER — Ambulatory Visit: Payer: Medicaid Other | Attending: Family Medicine

## 2019-09-09 ENCOUNTER — Other Ambulatory Visit: Payer: Self-pay

## 2019-09-09 DIAGNOSIS — M6281 Muscle weakness (generalized): Secondary | ICD-10-CM | POA: Insufficient documentation

## 2019-09-09 DIAGNOSIS — R600 Localized edema: Secondary | ICD-10-CM | POA: Insufficient documentation

## 2019-09-09 DIAGNOSIS — G8929 Other chronic pain: Secondary | ICD-10-CM | POA: Insufficient documentation

## 2019-09-09 DIAGNOSIS — Z96611 Presence of right artificial shoulder joint: Secondary | ICD-10-CM | POA: Insufficient documentation

## 2019-09-09 DIAGNOSIS — M25511 Pain in right shoulder: Secondary | ICD-10-CM | POA: Insufficient documentation

## 2019-09-09 NOTE — Therapy (Signed)
[place], [place], [address] Phone: [phone]   Fax:  [phone]  Physical Therapy Treatment/ERL  Patient Details  Name: Tina Mayo MRN: [id number] Date of Birth: 26-Jan-1971 Referring Provider (PT): Marchia Bond MD   Encounter Date: 09/09/2019  PT End of Session - 09/09/19 1508    Visit Number  4    Number of Visits  17    Date for PT Re-Evaluation  10/28/19    PT Start Time  1430    PT Stop Time  1500    PT Time Calculation (min)  30 min    Activity Tolerance  Patient tolerated treatment well    Behavior During Therapy  [facility] for tasks assessed/performed       Past Medical History:  Diagnosis Date  . Anxiety   . Asthma   . Avascular necrosis of head of humerus ([place]) 06/29/2019  . Bell's palsy   . Depression   . Sickle cell trait [facility])     Past Surgical History:  Procedure Laterality Date  . benign breast tumor removed     2006  . BREAST BIOPSY Right 2014   benign  . BREAST EXCISIONAL BIOPSY Right 2007   benign   . CHOLECYSTECTOMY N/A 12/16/2018   Procedure: LAPAROSCOPIC CHOLECYSTECTOMY;  Surgeon: Clovis Riley, MD;  Location: [place];  Service: General;  Laterality: N/A;  . right ankle surgery     2010  . TOTAL SHOULDER ARTHROPLASTY Right 06/29/2019   Procedure: TOTAL SHOULDER ARTHROPLASTY;  Surgeon: Marchia Bond, MD;  [facility];  Service: Orthopedics;  Laterality: Right;  . TUBAL LIGATION      There were no vitals filed for this visit.  Subjective Assessment - 09/09/19 1434    Subjective  Pt states that she needs to leave by 3 pm today. Pt is reporting 0/10 pain in her shoulder. She states that she saw the MD on Monday and he was pleased with her progress. She states she has not taken any medication today.    Pertinent History  R total shoulder arthroplasty    Limitations  Lifting    How long can you sit comfortably?  unlimited    How long can you stand  comfortably?  unlmited    How long can you walk comfortably?  unlimited    Currently in Pain?  No/denies    Pain Score  0-No pain    Pain Onset  More than a month ago    Pain Frequency  Constant               Quick Dash - 09/09/19 0001    Open a tight or new jar  Severe difficulty    Do heavy household chores (wash walls, wash floors)  No difficulty    Carry a shopping bag or briefcase  Severe difficulty    Wash your back  Severe difficulty    Use a knife to cut food  Mild difficulty    Recreational activities in which you take some force or impact through your arm, shoulder, or hand (golf, hammering, tennis)  Mild difficulty    During the past week, to what extent has your arm, shoulder or hand problem interfered with your normal social activities with family, friends, neighbors, or groups?  Quite a bit    During the past week, to what extent has your arm, shoulder or hand problem limited your work or other regular daily activities  Slightly  Arm, shoulder, or hand pain.  Severe    Tingling (pins and needles) in your arm, shoulder, or hand  Moderate    Difficulty Sleeping  So much difficuSo much difficulty, I can't sleep    DASH Score  54.55 %             [facility] Adult PT Treatment/Exercise - 09/09/19 0001      Shoulder Exercises: Standing   External Rotation  AROM;Strengthening;Right;15 reps    Theraband Level (Shoulder External Rotation)  Level 1 (Yellow)    External Rotation Limitations  VC for keeping shoulder tucked in order to prevent compensation w/Abd    Internal Rotation  AROM;Strengthening;Right;15 reps    Theraband Level (Shoulder Internal Rotation)  Level 1 (Yellow)    Internal Rotation Limitations  VC to prevent L trunk rotation and R shoulder flexion in order to decrease compensation    Extension  AROM;Strengthening;Both;15 reps    Theraband Level (Shoulder Extension)  Level 1 (Yellow)    Extension Limitations  VC for elbow straight and scapular squeeze     Row  Strengthening;Right;10 reps    Theraband Level (Shoulder Row)  Level 1 (Yellow)    Row Limitations  VC for scapular squeeze.                PT Short Term Goals - 09/09/19 1437      PT SHORT TERM GOAL #1   Title  pt to be I with inital HEP    Baseline  Pt is independent with her home HEP    Time  3    Period  Weeks    Status  Achieved      PT SHORT TERM GOAL #2   Title  reduce pain and inflammation in the R shoulder via RICE and HEP    Baseline  Pt pain is 0/10 this session    Time  3    Period  Weeks    Status  Achieved        PT Long Term Goals - 09/09/19 1439      PT LONG TERM GOAL #1   Title  increase R shoulder flexion/ abduction to >/= 120 degrees to maximize shoulder function work ADLS and potential work related activities with </= 2/10 pain    Baseline  R shoulder flexion 106 degrees, 107 degrees abduction, 0/10 pain. Pt reports tightness    Time  6    Period  Weeks    Status  On-going    Target Date  10/28/19      PT LONG TERM GOAL #2   Title  increaes shoulder IR/ ER to [facility] compared bil with </= 2/10 pain for donning/ doffing clothing, personal hygiene and ADLs    Baseline  IR R transverse L5, ER C3 with 7/10 pain    Time  6    Period  Weeks    Status  On-going    Target Date  10/28/19      PT LONG TERM GOAL #3   Title  pt to be able to lift and lower >/= 10# to and from an elevated shoulder and push/ pull >/= 20# with </=2/10 pain for functional strength    Baseline  Pt is progressing and able to use yellow and red bands for resistance for shoulder strengthening.    Time  6    Period  Weeks    Status  On-going    Target Date  10/28/19      PT LONG  TERM GOAL #4   Title  increaes QuickDash score to </= 40 to demo improvement in shoulder function    Baseline  inital score 79.55, 54.55 score 09/09/2019    Time  6    Period  Weeks    Status  On-going    Target Date  10/28/19      PT LONG TERM GOAL #5   Title  pt to be I with all HEP  given as of last visit to maintain and progress current level of function    Baseline  Ongoing pt continues to perform exercises provided.    Time  6    Period  Weeks    Status  On-going    Target Date  10/28/19            Plan - 09/09/19 1448    Clinical Impression Statement  Pt is improving since her initial evaluation as demonstrated by improved ROM (see goals), improved DASH (see goals) and improved pain score. Pt continues with less than functional ROM and is still experiencing difficulty performing activities of daily living such as dressing and grooming. She continues with significant pain with internal/external rotation. Pt will benefit from continued physical therapy 2x/week for 6 weeks in order to address the above limitations.    Personal Factors and Comorbidities  Comorbidity 3+    Comorbidities  hx or Anxiety/ depression and sickle cell trait    Examination-Activity Limitations  Lift;Reach Overhead    Examination-Participation Restrictions  Meal Prep;Laundry    Stability/Clinical Decision Making  Evolving/Moderate complexity    Rehab Potential  Good    PT Frequency  2x / week    PT Duration  6 weeks    PT Treatment/Interventions  ADLs/Self Care Home Management;Electrical Stimulation;Cryotherapy;Iontophoresis 4mg /ml Dexamethasone;Moist Heat;Ultrasound;Therapeutic activities;Therapeutic exercise;Neuromuscular re-education;Patient/family education;Manual techniques;Passive range of motion;Taping;Vasopneumatic Device    PT Next Visit Plan  Continue shoulder AAROM/AROM and strengthening progression as tolerated, manual/PROM for tightness as needed    PT Home Exercise Plan  Access Code: [email or web address] and continued with stretching pt was provided w/YTB    Consulted and Agree with Plan of Care  Patient       Patient will benefit from skilled therapeutic intervention in order to improve the following deficits and impairments:  Improper body mechanics, Increased muscle spasms, Decreased  strength, Pain, Impaired UE functional use, Decreased endurance, Decreased scar mobility, Decreased activity tolerance, Postural dysfunction, Decreased range of motion, Increased edema  Visit Diagnosis: H/O total shoulder replacement, right  Chronic right shoulder pain  Muscle weakness (generalized)  Localized edema     Problem List Patient Active Problem List   Diagnosis Date Noted  . Fever postop 07/01/2019  . Avascular necrosis of head of humerus ([place]) 06/29/2019  . Constipation due to slow transit 12/18/2018  . Constipation   . Choledocholithiasis 12/14/2018  . RUQ abdominal pain   . Trichimoniasis 05/03/2015  . Pap smear for cervical cancer screening 05/02/2015  . Perimenopausal 05/02/2015  . Vaginal discharge 05/02/2015  . Bell's palsy 02/28/2015  . Insomnia 12/26/2013  . Cystic breast 12/26/2013  . Tobacco abuse 09/21/2013  . Chest pain 08/26/2013  . Thrombocytopenia, unspecified ([place]) 08/26/2013  . Cardiomegaly 08/26/2013  . Sickle cell trait ([place]) 08/26/2013  . Abnormal CT of the chest 08/26/2013  . Depression 08/26/2013  . Anemia, unspecified 08/26/2013    Ander Purpura, PT 09/09/2019, 3:11 PM  [facility] [address] [place], [place], [address] Phone: [phone]   Fax:  [phone]  Name: Tina Mayo MRN: [id number] Date of Birth: 09-12-1971

## 2019-09-20 ENCOUNTER — Ambulatory Visit: Payer: Medicaid Other | Admitting: Physical Therapy

## 2019-09-20 ENCOUNTER — Encounter: Payer: Self-pay | Admitting: Physical Therapy

## 2019-09-20 ENCOUNTER — Other Ambulatory Visit: Payer: Self-pay

## 2019-09-20 DIAGNOSIS — G8929 Other chronic pain: Secondary | ICD-10-CM

## 2019-09-20 DIAGNOSIS — R600 Localized edema: Secondary | ICD-10-CM

## 2019-09-20 DIAGNOSIS — M6281 Muscle weakness (generalized): Secondary | ICD-10-CM

## 2019-09-20 DIAGNOSIS — Z96611 Presence of right artificial shoulder joint: Secondary | ICD-10-CM | POA: Diagnosis not present

## 2019-09-20 NOTE — Therapy (Signed)
[place], [place], [address] Phone: [phone]   Fax:  [phone]  Physical Therapy Treatment  Patient Details  Name: Tina Mayo MRN: [id number] Date of Birth: [birth year] Referring Provider (PT): Marchia Bond MD   Encounter Date: 09/20/2019  PT End of Session - 09/20/19 0842    Visit Number  5    Number of Visits  17    Date for PT Re-Evaluation  10/28/19    Authorization Type  MCD    Authorization Time Period  09/20/19-10/31/19    Authorization - Visit Number  1    Authorization - Number of Visits  12    PT Start Time  0801    PT Stop Time  0851    PT Time Calculation (min)  50 min    Activity Tolerance  Patient tolerated treatment well    Behavior During Therapy  [facility] for tasks assessed/performed       Past Medical History:  Diagnosis Date  . Anxiety   . Asthma   . Avascular necrosis of head of humerus ([place]) 06/29/2019  . Bell's palsy   . Depression   . Sickle cell trait [facility])     Past Surgical History:  Procedure Laterality Date  . benign breast tumor removed     2006  . BREAST BIOPSY Right 2014   benign  . BREAST EXCISIONAL BIOPSY Right 2007   benign   . CHOLECYSTECTOMY N/A 12/16/2018   Procedure: LAPAROSCOPIC CHOLECYSTECTOMY;  Surgeon: Clovis Riley, MD;  Location: [place];  Service: General;  Laterality: N/A;  . right ankle surgery     2010  . TOTAL SHOULDER ARTHROPLASTY Right 06/29/2019   Procedure: TOTAL SHOULDER ARTHROPLASTY;  Surgeon: Marchia Bond, MD;  Location[facility];  Service: Orthopedics;  Laterality: Right;  . TUBAL LIGATION      There were no vitals filed for this visit.  Subjective Assessment - 09/20/19 0802    Subjective  Pt. rates her right shoulder pain 10/10 this AM. Discussed pain scale but she still rates a 10/10 with no overt signs of physiological distress. She denies new trauma or injury, reports increased soreness with rainy weather.    Currently in Pain?  Yes    Pain Score  10-Worst pain ever    Pain Location  Shoulder    Pain Orientation  Right    Pain Descriptors / Indicators  Aching   "toothache 10"   Pain Type  Surgical pain    Pain Onset  More than a month ago    Pain Frequency  Constant    Aggravating Factors   activity, movement    Pain Relieving Factors  medication and heat    Effect of Pain on Daily Activities  limits ability reaching ADLs                       [facility] Adult PT Treatment/Exercise - 09/20/19 0001      Elbow Exercises   Elbow Flexion  --   2x10 right bicep curl with 2 lbs.     Shoulder Exercises: Supine   Flexion  AROM;Strengthening;Right;20 reps    Shoulder Flexion Weight (lbs)  2    Flexion Limitations  short arc press      Shoulder Exercises: Sidelying   External Rotation  AROM;Strengthening;Right;15 reps    External Rotation Weight (lbs)  1    ABduction  AROM;Strengthening;Right;15 reps    ABduction Weight (lbs)  1      Shoulder Exercises: Standing   External Rotation  AROM;Strengthening;Right;15 reps    Theraband Level (Shoulder External Rotation)  Level 1 (Yellow)    Internal Rotation  AROM;Strengthening;Right;15 reps    Theraband Level (Shoulder Internal Rotation)  Level 1 (Yellow)    Flexion Limitations  2x10 full can to 90 deg with 1 lb.    ABduction  AROM;Right;20 reps    ABduction Limitations  scaption AROM full can    Extension  AROM;Strengthening;Both;15 reps    Theraband Level (Shoulder Extension)  Level 1 (Yellow)    Row  Strengthening;Both;15 reps    Theraband Level (Shoulder Row)  Level 2 (Red)      Shoulder Exercises: Pulleys   Flexion  2 minutes      Moist Heat Therapy   Number Minutes Moist Heat  10 Minutes    Moist Heat Location  Shoulder      Manual Therapy   Manual Therapy  Soft tissue mobilization    Soft tissue mobilization  posterior scapular region and upper trapezius    Passive ROM  4-way PROM             PT Education -  09/20/19 0842    Education Details  POC, use of Theracane for soft tissue release    Person(s) Educated  Patient    Methods  Explanation;Demonstration;Verbal cues    Comprehension  Verbalized understanding       PT Short Term Goals - 09/09/19 1437      PT SHORT TERM GOAL #1   Title  pt to be I with inital HEP    Baseline  Pt is independent with her home HEP    Time  3    Period  Weeks    Status  Achieved      PT SHORT TERM GOAL #2   Title  reduce pain and inflammation in the R shoulder via RICE and HEP    Baseline  Pt pain is 0/10 this session    Time  3    Period  Weeks    Status  Achieved        PT Long Term Goals - 09/09/19 1439      PT LONG TERM GOAL #1   Title  increase R shoulder flexion/ abduction to >/= 120 degrees to maximize shoulder function work Sport and exercise psychologist and potential work related activities with </= 2/10 pain    Baseline  R shoulder flexion 106 degrees, 107 degrees abduction, 0/10 pain. Pt reports tightness    Time  6    Period  Weeks    Status  On-going    Target Date  10/28/19      PT LONG TERM GOAL #2   Title  increaes shoulder IR/ ER to [facility] compared bil with </= 2/10 pain for donning/ doffing clothing, personal hygiene and ADLs    Baseline  IR R transverse L5, ER C3 with 7/10 pain    Time  6    Period  Weeks    Status  On-going    Target Date  10/28/19      PT LONG TERM GOAL #3   Title  pt to be able to lift and lower >/= 10# to and from an elevated shoulder and push/ pull >/= 20# with </=2/10 pain for functional strength    Baseline  Pt is progressing and able to use yellow and red bands for resistance for shoulder strengthening.    Time  6  Period  Weeks    Status  On-going    Target Date  10/28/19      PT LONG TERM GOAL #4   Title  increaes QuickDash score to </= 40 to demo improvement in shoulder function    Baseline  inital score 79.55, 54.55 score 09/09/2019    Time  6    Period  Weeks    Status  On-going    Target Date  10/28/19       PT LONG TERM GOAL #5   Title  pt to be I with all HEP given as of last visit to maintain and progress current level of function    Baseline  Ongoing pt continues to perform exercises provided.    Time  6    Period  Weeks    Status  On-going    Target Date  10/28/19            Plan - 09/20/19 0843    Clinical Impression Statement  Initial high pain rating as noted in subjective but able to tolerate exercises as noted per flowsheet with some general soreness but no overt exacerbation of pain. Frequent cueing to avoid shoulder shrug with exercises. Tightness limiting end-ranges ROM all planes but most notable with IR/reaching behind back. Pt. would benefit from continued PT for further progress to address remaining functional limitations with RUE use for reaching and lifting activities.    Personal Factors and Comorbidities  Comorbidity 3+    Comorbidities  hx or Anxiety/ depression and sickle cell trait    Examination-Activity Limitations  Lift;Reach Overhead    Examination-Participation Restrictions  Meal Prep;Laundry    Stability/Clinical Decision Making  Evolving/Moderate complexity    Clinical Decision Making  Moderate    Rehab Potential  Good    PT Frequency  2x / week    PT Duration  6 weeks    PT Treatment/Interventions  ADLs/Self Care Home Management;Electrical Stimulation;Cryotherapy;Iontophoresis 4mg /ml Dexamethasone;Moist Heat;Ultrasound;Therapeutic activities;Therapeutic exercise;Neuromuscular re-education;Patient/family education;Manual techniques;Passive range of motion;Taping;Vasopneumatic Device    PT Next Visit Plan  Continue shoulder AAROM/AROM and strengthening progression as tolerated, manual/PROM for tightness as needed    PT Home Exercise Plan  Access Code: [email or web address] and continued with stretching pt was provided w/YTB    Consulted and Agree with Plan of Care  Patient       Patient will benefit from skilled therapeutic intervention in order to improve the following  deficits and impairments:  Improper body mechanics, Increased muscle spasms, Decreased strength, Pain, Impaired UE functional use, Decreased endurance, Decreased scar mobility, Decreased activity tolerance, Postural dysfunction, Decreased range of motion, Increased edema  Visit Diagnosis: H/O total shoulder replacement, right  Chronic right shoulder pain  Muscle weakness (generalized)  Localized edema     Problem List Patient Active Problem List   Diagnosis Date Noted  . Fever postop 07/01/2019  . Avascular necrosis of head of humerus ([place]) 06/29/2019  . Constipation due to slow transit 12/18/2018  . Constipation   . Choledocholithiasis 12/14/2018  . RUQ abdominal pain   . Trichimoniasis 05/03/2015  . Pap smear for cervical cancer screening 05/02/2015  . Perimenopausal 05/02/2015  . Vaginal discharge 05/02/2015  . Bell's palsy 02/28/2015  . Insomnia 12/26/2013  . Cystic breast 12/26/2013  . Tobacco abuse 09/21/2013  . Chest pain 08/26/2013  . Thrombocytopenia, unspecified ([place]) 08/26/2013  . Cardiomegaly 08/26/2013  . Sickle cell trait ([place]) 08/26/2013  . Abnormal CT of the chest 08/26/2013  . Depression 08/26/2013  . Anemia,  unspecified 08/26/2013    Beaulah Dinning, PT, DPT 09/20/19 8:46 AM  [facility] [address] [place], [place], [address] Phone: [phone]   Fax:  [phone]  Name: Tina Mayo MRN: [id number] Date of Birth: 10/21/1971

## 2019-09-23 ENCOUNTER — Other Ambulatory Visit: Payer: Self-pay

## 2019-09-23 ENCOUNTER — Ambulatory Visit: Payer: Medicaid Other | Admitting: Physical Therapy

## 2019-09-23 DIAGNOSIS — R600 Localized edema: Secondary | ICD-10-CM

## 2019-09-23 DIAGNOSIS — Z96611 Presence of right artificial shoulder joint: Secondary | ICD-10-CM

## 2019-09-23 DIAGNOSIS — M6281 Muscle weakness (generalized): Secondary | ICD-10-CM

## 2019-09-23 DIAGNOSIS — G8929 Other chronic pain: Secondary | ICD-10-CM

## 2019-09-23 NOTE — Therapy (Signed)
[place], [place], [address] Phone: [phone]   Fax:  [phone]  Physical Therapy Treatment  Patient Details  Name: Tina Mayo MRN: [id number] Date of Birth: 04-13-1971 Referring Provider (PT): Marchia Bond MD   Encounter Date: 09/23/2019  PT End of Session - 09/23/19 0927    Visit Number  6    Number of Visits  17    Date for PT Re-Evaluation  10/28/19    Authorization Type  MCD    Authorization Time Period  09/20/19-10/31/19    Authorization - Visit Number  2    Authorization - Number of Visits  12    PT Start Time  0846    PT Stop Time  0936    PT Time Calculation (min)  50 min    Activity Tolerance  Patient tolerated treatment well    Behavior During Therapy  [facility] for tasks assessed/performed       Past Medical History:  Diagnosis Date  . Anxiety   . Asthma   . Avascular necrosis of head of humerus ([place]) 06/29/2019  . Bell's palsy   . Depression   . Sickle cell trait [facility])     Past Surgical History:  Procedure Laterality Date  . benign breast tumor removed     2006  . BREAST BIOPSY Right 2014   benign  . BREAST EXCISIONAL BIOPSY Right 2007   benign   . CHOLECYSTECTOMY N/A 12/16/2018   Procedure: LAPAROSCOPIC CHOLECYSTECTOMY;  Surgeon: Clovis Riley, MD;  Location: [place];  Service: General;  Laterality: N/A;  . right ankle surgery     2010  . TOTAL SHOULDER ARTHROPLASTY Right 06/29/2019   Procedure: TOTAL SHOULDER ARTHROPLASTY;  Surgeon: Marchia Bond, MD;  [facility];  Service: Orthopedics;  Laterality: Right;  . TUBAL LIGATION      There were no vitals filed for this visit.      [facility] PT Assessment - 09/23/19 0001      AROM   Right Shoulder Flexion  130 Degrees    Right Shoulder ABduction  100 Degrees   with shoulder hike                  [facility] Adult PT Treatment/Exercise - 09/23/19 0001      Elbow Exercises   Elbow Flexion  --    bicep curl 3 lbs. 2x10     Shoulder Exercises: Sidelying   External Rotation  AROM;Strengthening;Right;15 reps    External Rotation Weight (lbs)  1    ABduction  AROM;Strengthening;Right;15 reps    ABduction Weight (lbs)  1      Shoulder Exercises: Standing   External Rotation  AROM;Strengthening;Right;20 reps    Theraband Level (Shoulder External Rotation)  Level 1 (Yellow)    Internal Rotation  AROM;Strengthening;Right;20 reps    Theraband Level (Shoulder Internal Rotation)  Level 2 (Red)    Flexion Limitations  2x10 full can to 90 deg with 1 lb.    ABduction  AROM;Right;20 reps    Shoulder ABduction Weight (lbs)  1    ABduction Limitations  scaption AROM full can    Extension  AROM;Strengthening;Both;20 reps    Theraband Level (Shoulder Extension)  Level 2 (Red)    Row  AROM;Strengthening;Both;20 reps    Theraband Level (Shoulder Row)  Level 3 (Green)    Other Standing Exercises  [place] flexion red band 2x10      Shoulder Exercises: Pulleys   Flexion  2 minutes      Shoulder Exercises: Stretch   Other Shoulder Stretches  towel stretch for right shoulder IR 5 x 10 sec    Other Shoulder Stretches  cross body horiz. adduction stretch 3x10 sec      Moist Heat Therapy   Number Minutes Moist Heat  10 Minutes    Moist Heat Location  Shoulder      Manual Therapy   Soft tissue mobilization  posterior scapular region and upper trapezius    Passive ROM  4-way PROM               PT Short Term Goals - 09/09/19 1437      PT SHORT TERM GOAL #1   Title  pt to be I with inital HEP    Baseline  Pt is independent with her home HEP    Time  3    Period  Weeks    Status  Achieved      PT SHORT TERM GOAL #2   Title  reduce pain and inflammation in the R shoulder via RICE and HEP    Baseline  Pt pain is 0/10 this session    Time  3    Period  Weeks    Status  Achieved        PT Long Term Goals - 09/09/19 1439      PT LONG TERM GOAL #1   Title  increase R shoulder  flexion/ abduction to >/= 120 degrees to maximize shoulder function work Sport and exercise psychologist and potential work related activities with </= 2/10 pain    Baseline  R shoulder flexion 106 degrees, 107 degrees abduction, 0/10 pain. Pt reports tightness    Time  6    Period  Weeks    Status  On-going    Target Date  10/28/19      PT LONG TERM GOAL #2   Title  increaes shoulder IR/ ER to [facility] compared bil with </= 2/10 pain for donning/ doffing clothing, personal hygiene and ADLs    Baseline  IR R transverse L5, ER C3 with 7/10 pain    Time  6    Period  Weeks    Status  On-going    Target Date  10/28/19      PT LONG TERM GOAL #3   Title  pt to be able to lift and lower >/= 10# to and from an elevated shoulder and push/ pull >/= 20# with </=2/10 pain for functional strength    Baseline  Pt is progressing and able to use yellow and red bands for resistance for shoulder strengthening.    Time  6    Period  Weeks    Status  On-going    Target Date  10/28/19      PT LONG TERM GOAL #4   Title  increaes QuickDash score to </= 40 to demo improvement in shoulder function    Baseline  inital score 79.55, 54.55 score 09/09/2019    Time  6    Period  Weeks    Status  On-going    Target Date  10/28/19      PT LONG TERM GOAL #5   Title  pt to be I with all HEP given as of last visit to maintain and progress current level of function    Baseline  Ongoing pt continues to perform exercises provided.    Time  6    Period  Weeks    Status  On-going    Target Date  10/28/19            Plan - 09/23/19 [id number]    Clinical Impression Statement  Pt. continues to require cues to avoid shoulder hike with ROM but progressing from previous status with improving reaching ability. Added towel stretch and cross body horizontal adduction stretch to assist reaching behind back for dressing with mild soreness but overall well-tolerated.    Personal Factors and Comorbidities  Comorbidity 3+    Comorbidities  hx or Anxiety/  depression and sickle cell trait    Examination-Activity Limitations  Lift;Reach Overhead    Examination-Participation Restrictions  Meal Prep;Laundry    Stability/Clinical Decision Making  Evolving/Moderate complexity    Clinical Decision Making  Moderate    Rehab Potential  Good    PT Frequency  2x / week    PT Duration  6 weeks    PT Treatment/Interventions  ADLs/Self Care Home Management;Electrical Stimulation;Cryotherapy;Iontophoresis 4mg /ml Dexamethasone;Moist Heat;Ultrasound;Therapeutic activities;Therapeutic exercise;Neuromuscular re-education;Patient/family education;Manual techniques;Passive range of motion;Taping;Vasopneumatic Device    PT Next Visit Plan  Continue shoulder AAROM/AROM and strengthening progression as tolerated, manual/PROM for tightness as needed    PT Home Exercise Plan  Access Code: [email or web address] and continued with stretching pt was provided w/YTB    Consulted and Agree with Plan of Care  Patient       Patient will benefit from skilled therapeutic intervention in order to improve the following deficits and impairments:  Improper body mechanics, Increased muscle spasms, Decreased strength, Pain, Impaired UE functional use, Decreased endurance, Decreased scar mobility, Decreased activity tolerance, Postural dysfunction, Decreased range of motion, Increased edema  Visit Diagnosis: H/O total shoulder replacement, right  Chronic right shoulder pain  Muscle weakness (generalized)  Localized edema     Problem List Patient Active Problem List   Diagnosis Date Noted  . Fever postop 07/01/2019  . Avascular necrosis of head of humerus ([place]) 06/29/2019  . Constipation due to slow transit 12/18/2018  . Constipation   . Choledocholithiasis 12/14/2018  . RUQ abdominal pain   . Trichimoniasis 05/03/2015  . Pap smear for cervical cancer screening 05/02/2015  . Perimenopausal 05/02/2015  . Vaginal discharge 05/02/2015  . Bell's palsy 02/28/2015  . Insomnia 12/26/2013   . Cystic breast 12/26/2013  . Tobacco abuse 09/21/2013  . Chest pain 08/26/2013  . Thrombocytopenia, unspecified ([place]) 08/26/2013  . Cardiomegaly 08/26/2013  . Sickle cell trait ([place]) 08/26/2013  . Abnormal CT of the chest 08/26/2013  . Depression 08/26/2013  . Anemia, unspecified 08/26/2013    Beaulah Dinning, PT, DPT 09/23/19 9:30 AM  [facility] [address] [place], [place], [address] Phone: [phone]   Fax:  [phone]  Name: Tina Mayo MRN: [id number] Date of Birth: 11-14-1971

## 2019-09-30 ENCOUNTER — Encounter: Payer: Self-pay | Admitting: Physical Therapy

## 2019-09-30 ENCOUNTER — Other Ambulatory Visit: Payer: Self-pay

## 2019-09-30 ENCOUNTER — Ambulatory Visit: Payer: Medicaid Other | Admitting: Physical Therapy

## 2019-09-30 DIAGNOSIS — R600 Localized edema: Secondary | ICD-10-CM

## 2019-09-30 DIAGNOSIS — G8929 Other chronic pain: Secondary | ICD-10-CM

## 2019-09-30 DIAGNOSIS — Z96611 Presence of right artificial shoulder joint: Secondary | ICD-10-CM

## 2019-09-30 DIAGNOSIS — M25511 Pain in right shoulder: Secondary | ICD-10-CM

## 2019-09-30 DIAGNOSIS — M6281 Muscle weakness (generalized): Secondary | ICD-10-CM

## 2019-09-30 NOTE — Therapy (Signed)
[place], [place], [address] Phone: [phone]   Fax:  [phone]  Physical Therapy Treatment  Patient Details  Name: Tina Mayo MRN: [id number] Date of Birth: 1971-03-08 Referring Provider (PT): Marchia Bond MD   Encounter Date: 09/30/2019  PT End of Session - 09/30/19 0952    Visit Number  7    Number of Visits  17    Date for PT Re-Evaluation  10/28/19    Authorization Type  MCD    Authorization Time Period  09/20/19-10/31/19    Authorization - Visit Number  3    Authorization - Number of Visits  12    PT Start Time  0905    PT Stop Time  0949    PT Time Calculation (min)  44 min    Activity Tolerance  Patient tolerated treatment well    Behavior During Therapy  [facility] for tasks assessed/performed       Past Medical History:  Diagnosis Date  . Anxiety   . Asthma   . Avascular necrosis of head of humerus ([place]) 06/29/2019  . Bell's palsy   . Depression   . Sickle cell trait [facility])     Past Surgical History:  Procedure Laterality Date  . benign breast tumor removed     2006  . BREAST BIOPSY Right 2014   benign  . BREAST EXCISIONAL BIOPSY Right 2007   benign   . CHOLECYSTECTOMY N/A 12/16/2018   Procedure: LAPAROSCOPIC CHOLECYSTECTOMY;  Surgeon: Clovis Riley, MD;  Location: [place];  Service: General;  Laterality: N/A;  . right ankle surgery     2010  . TOTAL SHOULDER ARTHROPLASTY Right 06/29/2019   Procedure: TOTAL SHOULDER ARTHROPLASTY;  Surgeon: Marchia Bond, MD;  Location: [facility];  Service: Orthopedics;  Laterality: Right;  . TUBAL LIGATION      There were no vitals filed for this visit.  Subjective Assessment - 09/30/19 0907    Subjective  Pt. reports left shoulder has been sore with overcompensation for right side otherwise no new complaints/concerns this AM.    Pertinent History  R total shoulder arthroplasty    Limitations  Lifting    Diagnostic tests  x-ray  before surgery    Patient Stated Goals  to get more function in the R shoulder,    Currently in Pain?  Yes    Pain Score  7     Pain Location  Shoulder    Pain Orientation  Right    Pain Descriptors / Indicators  Aching    Pain Type  Surgical pain    Pain Onset  More than a month ago    Pain Frequency  Constant    Aggravating Factors   activity, movement    Pain Relieving Factors  medication and heat    Effect of Pain on Daily Activities  limits ability reaching ADLs    Multiple Pain Sites  Yes    Pain Score  9    Pain Location  Shoulder    Pain Orientation  Left    Pain Descriptors / Indicators  Aching    Pain Type  Acute pain    Pain Onset  In the past 7 days    Pain Frequency  Constant    Aggravating Factors   reaching, activity    Pain Relieving Factors  rest  [facility] Adult PT Treatment/Exercise - 09/30/19 0001      Elbow Exercises   Elbow Flexion  --   bicep curl 3 lbs. 2x10     Shoulder Exercises: Supine   Protraction  AROM;Strengthening;Right;15 reps    Protraction Weight (lbs)  2    Flexion  AROM;Strengthening;Right;20 reps    Shoulder Flexion Weight (lbs)  2    Flexion Limitations  short arc press      Shoulder Exercises: Sidelying   External Rotation  AROM;Strengthening;Right;15 reps    External Rotation Weight (lbs)  1    ABduction  AROM;Strengthening;Right;15 reps    ABduction Weight (lbs)  1      Shoulder Exercises: Standing   External Rotation  AROM;Strengthening;Right;20 reps    Theraband Level (Shoulder External Rotation)  Level 2 (Red)    Internal Rotation  AROM;Strengthening;Right;20 reps    Theraband Level (Shoulder Internal Rotation)  Level 2 (Red)    Flexion Limitations  2x10 full can to 90 deg with 2 lbs.    ABduction  AROM;Right;20 reps   plane of scaption   Shoulder ABduction Weight (lbs)  1    Extension  AROM;Strengthening;Both;20 reps    Theraband Level (Shoulder Extension)  Level 2 (Red)    Row   AROM;Strengthening;Both;20 reps    Theraband Level (Shoulder Row)  Level 3 (Green)    Other Standing Exercises  [place] flexion red band 2x10      Shoulder Exercises: Pulleys   Flexion  2 minutes    Scaption  2 minutes      Shoulder Exercises: ROM/Strengthening   UBE (Upper Arm Bike)  L1 x 5 min    1/2 fw and rev     Moist Heat Therapy   Number Minutes Moist Heat  --   pt. declined modalities     Manual Therapy   Passive ROM  4-way PROM             PT Education - 09/30/19 0952    Education Details  POC, exercises    Person(s) Educated  Patient    Methods  Explanation;Demonstration;Verbal cues    Comprehension  Verbalized understanding;Returned demonstration       PT Short Term Goals - 09/09/19 1437      PT SHORT TERM GOAL #1   Title  pt to be I with inital HEP    Baseline  Pt is independent with her home HEP    Time  3    Period  Weeks    Status  Achieved      PT SHORT TERM GOAL #2   Title  reduce pain and inflammation in the R shoulder via RICE and HEP    Baseline  Pt pain is 0/10 this session    Time  3    Period  Weeks    Status  Achieved        PT Long Term Goals - 09/09/19 1439      PT LONG TERM GOAL #1   Title  increase R shoulder flexion/ abduction to >/= 120 degrees to maximize shoulder function work Sport and exercise psychologist and potential work related activities with </= 2/10 pain    Baseline  R shoulder flexion 106 degrees, 107 degrees abduction, 0/10 pain. Pt reports tightness    Time  6    Period  Weeks    Status  On-going    Target Date  10/28/19      PT LONG TERM GOAL #2   Title  increaes shoulder  IR/ ER to [facility] compared bil with </= 2/10 pain for donning/ doffing clothing, personal hygiene and ADLs    Baseline  IR R transverse L5, ER C3 with 7/10 pain    Time  6    Period  Weeks    Status  On-going    Target Date  10/28/19      PT LONG TERM GOAL #3   Title  pt to be able to lift and lower >/= 10# to and from an elevated shoulder and push/ pull >/=  20# with </=2/10 pain for functional strength    Baseline  Pt is progressing and able to use yellow and red bands for resistance for shoulder strengthening.    Time  6    Period  Weeks    Status  On-going    Target Date  10/28/19      PT LONG TERM GOAL #4   Title  increaes QuickDash score to </= 40 to demo improvement in shoulder function    Baseline  inital score 79.55, 54.55 score 09/09/2019    Time  6    Period  Weeks    Status  On-going    Target Date  10/28/19      PT LONG TERM GOAL #5   Title  pt to be I with all HEP given as of last visit to maintain and progress current level of function    Baseline  Ongoing pt continues to perform exercises provided.    Time  6    Period  Weeks    Status  On-going    Target Date  10/28/19            Plan - 09/30/19 0953    Clinical Impression Statement  Still with high pain level 3 months s/p surgery but pt. does continue to gradually progress with her ROM and strength with subsequent functional gains for reaching/RUE use for ADLs and chores. No note of injury to left side/in agreement with patient that this could be associated with overcompensation from right side s/p surgery. Will continue to monitor and advised pt. to have this side evaluated by MD if continuing to have pain and limitations.    Personal Factors and Comorbidities  Comorbidity 3+    Comorbidities  hx or Anxiety/ depression and sickle cell trait    Examination-Activity Limitations  Lift;Reach Overhead    Examination-Participation Restrictions  Meal Prep;Laundry    Stability/Clinical Decision Making  Evolving/Moderate complexity    Clinical Decision Making  Moderate    Rehab Potential  Good    PT Frequency  2x / week    PT Duration  6 weeks    PT Treatment/Interventions  ADLs/Self Care Home Management;Electrical Stimulation;Cryotherapy;Iontophoresis 4mg /ml Dexamethasone;Moist Heat;Ultrasound;Therapeutic activities;Therapeutic exercise;Neuromuscular  re-education;Patient/family education;Manual techniques;Passive range of motion;Taping;Vasopneumatic Device    PT Next Visit Plan  Continue shoulder AROM and strengthening progression as tolerated, manual/PROM for tightness as needed    PT Home Exercise Plan  Access Code: [email or web address] and continued with stretching pt was provided w/YTB    Consulted and Agree with Plan of Care  Patient       Patient will benefit from skilled therapeutic intervention in order to improve the following deficits and impairments:  Improper body mechanics, Increased muscle spasms, Decreased strength, Pain, Impaired UE functional use, Decreased endurance, Decreased scar mobility, Decreased activity tolerance, Postural dysfunction, Decreased range of motion, Increased edema  Visit Diagnosis: H/O total shoulder replacement, right  Chronic right shoulder pain  Muscle weakness (generalized)  Localized edema     Problem List Patient Active Problem List   Diagnosis Date Noted  . Fever postop 07/01/2019  . Avascular necrosis of head of humerus ([place]) 06/29/2019  . Constipation due to slow transit 12/18/2018  . Constipation   . Choledocholithiasis 12/14/2018  . RUQ abdominal pain   . Trichimoniasis 05/03/2015  . Pap smear for cervical cancer screening 05/02/2015  . Perimenopausal 05/02/2015  . Vaginal discharge 05/02/2015  . Bell's palsy 02/28/2015  . Insomnia 12/26/2013  . Cystic breast 12/26/2013  . Tobacco abuse 09/21/2013  . Chest pain 08/26/2013  . Thrombocytopenia, unspecified ([place]) 08/26/2013  . Cardiomegaly 08/26/2013  . Sickle cell trait ([place]) 08/26/2013  . Abnormal CT of the chest 08/26/2013  . Depression 08/26/2013  . Anemia, unspecified 08/26/2013    Beaulah Dinning, PT, DPT 09/30/19 9:56 AM  [facility] [address] [place], [place], [address] Phone: [phone]   Fax:  [phone]  Name: Tina Mayo MRN: [id number] Date of  Birth: Aug 06, 1971

## 2019-10-05 ENCOUNTER — Ambulatory Visit: Payer: Medicaid Other | Admitting: Physical Therapy

## 2019-10-07 ENCOUNTER — Ambulatory Visit: Payer: Medicaid Other | Admitting: Physical Therapy

## 2019-10-07 ENCOUNTER — Encounter: Payer: Self-pay | Admitting: Physical Therapy

## 2019-10-07 ENCOUNTER — Other Ambulatory Visit: Payer: Self-pay

## 2019-10-07 DIAGNOSIS — Z96611 Presence of right artificial shoulder joint: Secondary | ICD-10-CM | POA: Diagnosis not present

## 2019-10-07 DIAGNOSIS — M25511 Pain in right shoulder: Secondary | ICD-10-CM

## 2019-10-07 DIAGNOSIS — M6281 Muscle weakness (generalized): Secondary | ICD-10-CM

## 2019-10-07 DIAGNOSIS — G8929 Other chronic pain: Secondary | ICD-10-CM

## 2019-10-07 DIAGNOSIS — R600 Localized edema: Secondary | ICD-10-CM

## 2019-10-07 NOTE — Therapy (Signed)
[place], [place], [address] Phone: [phone]   Fax:  [phone]  Physical Therapy Treatment  Patient Details  Name: Tina Mayo MRN: [id number] Date of Birth: 1971-05-21 Referring Provider (PT): Marchia Bond MD   Encounter Date: 10/07/2019  PT End of Session - 10/07/19 1045    Visit Number  8    Number of Visits  17    Date for PT Re-Evaluation  10/28/19    Authorization Type  MCD    Authorization Time Period  09/20/19-10/31/19    PT Start Time  0905    PT Stop Time  0945    PT Time Calculation (min)  40 min    Activity Tolerance  Patient tolerated treatment well    Behavior During Therapy  [facility] for tasks assessed/performed       Past Medical History:  Diagnosis Date  . Anxiety   . Asthma   . Avascular necrosis of head of humerus ([place]) 06/29/2019  . Bell's palsy   . Depression   . Sickle cell trait [facility])     Past Surgical History:  Procedure Laterality Date  . benign breast tumor removed     2006  . BREAST BIOPSY Right 2014   benign  . BREAST EXCISIONAL BIOPSY Right 2007   benign   . CHOLECYSTECTOMY N/A 12/16/2018   Procedure: LAPAROSCOPIC CHOLECYSTECTOMY;  Surgeon: Clovis Riley, MD;  Location: [place];  Service: General;  Laterality: N/A;  . right ankle surgery     2010  . TOTAL SHOULDER ARTHROPLASTY Right 06/29/2019   Procedure: TOTAL SHOULDER ARTHROPLASTY;  Surgeon: Marchia Bond, MD;  Location[facility];  Service: Orthopedics;  Laterality: Right;  . TUBAL LIGATION      There were no vitals filed for this visit.  Subjective Assessment - 10/07/19 0908    Subjective  Pt. reports cancelled visit earlier this week due to her right shoulder and arm being "inflamed". She saw Dr. Mardelle Matte for assessment and reports had X-rays. No abnormal findings noted but pt. reports cautioned to not overwork shoulder and to back down her therapy intensity.    Pertinent History  R total  shoulder arthroplasty    Currently in Pain?  Yes    Pain Score  6     Pain Location  Shoulder    Pain Orientation  Right    Pain Descriptors / Indicators  --   "thump"   Pain Type  Surgical pain    Pain Onset  More than a month ago    Pain Frequency  Intermittent    Aggravating Factors   acttivity, movement    Pain Relieving Factors  medication and heat    Effect of Pain on Daily Activities  limits ability reeaching ADLS         [facility] PT Assessment - 10/07/19 0001      AROM   Right Shoulder Flexion  130 Degrees    Right Shoulder ABduction  100 Degrees    Right Shoulder Internal Rotation  --   reach to gluteals   Right Shoulder External Rotation  --   reach to C7                  [facility] Adult PT Treatment/Exercise - 10/07/19 0001      Shoulder Exercises: Sidelying   External Rotation  AROM;Strengthening;Right;20 reps    External Rotation Weight (lbs)  1    ABduction  AROM;Strengthening;Right;20 reps  ABduction Weight (lbs)  1      Shoulder Exercises: Standing   External Rotation  AROM;Strengthening;Right;20 reps    Theraband Level (Shoulder External Rotation)  Level 2 (Red)    Internal Rotation  AROM;Strengthening;Right;20 reps    Theraband Level (Shoulder Internal Rotation)  Level 2 (Red)    Flexion Limitations  2x10 full can to 90 deg with 1 lbs.    ABduction  AROM;Right;20 reps   plane of scaption   Shoulder ABduction Weight (lbs)  1    ABduction Limitations  mod cues for full can positioning for right UE    Extension  AROM;Strengthening;Both;20 reps    Theraband Level (Shoulder Extension)  Level 2 (Red)    Row  AROM;Strengthening;Both;20 reps    Theraband Level (Shoulder Row)  Level 3 (Green)      Shoulder Exercises: Pulleys   Flexion  2 minutes    Scaption  2 minutes      Manual Therapy   Soft tissue mobilization  STM right bicep, tricep and posterior scapular region             PT Education - 10/07/19 1044    Education Details  HEP,  POC, education etiology myofascial pain and inflammation    Person(s) Educated  Patient    Methods  Explanation;Demonstration;Verbal cues    Comprehension  Verbalized understanding;Returned demonstration       PT Short Term Goals - 09/09/19 1437      PT SHORT TERM GOAL #1   Title  pt to be I with inital HEP    Baseline  Pt is independent with her home HEP    Time  3    Period  Weeks    Status  Achieved      PT SHORT TERM GOAL #2   Title  reduce pain and inflammation in the R shoulder via RICE and HEP    Baseline  Pt pain is 0/10 this session    Time  3    Period  Weeks    Status  Achieved        PT Long Term Goals - 10/07/19 1049      PT LONG TERM GOAL #1   Title  increase R shoulder flexion/ abduction to >/= 120 degrees to maximize shoulder function work Sport and exercise psychologist and potential work related activities with </= 2/10 pain    Baseline  flexion 130 deg, abduction 100 deg    Time  6    Period  Weeks    Status  On-going      PT LONG TERM GOAL #2   Title  increaes shoulder IR/ ER to [facility] compared bil with </= 2/10 pain for donning/ doffing clothing, personal hygiene and ADLs    Baseline  IR reach to gluteals, ER reach to C7 with pain 6/10 today    Time  6    Period  Weeks    Status  On-going      PT LONG TERM GOAL #3   Title  pt to be able to lift and lower >/= 10# to and from an elevated shoulder and push/ pull >/= 20# with </=2/10 pain for functional strength    Baseline  Pt is progressing and able to use red and green bands for resistance for shoulder strengthening.    Time  6    Period  Weeks    Status  On-going      PT LONG TERM GOAL #4   Title  increaes QuickDash score  to </= 40 to demo improvement in shoulder function    Baseline  inital score 79.55, 54.55 score 09/09/2019    Time  6    Period  Weeks    Status  On-going      PT LONG TERM GOAL #5   Title  pt to be I with all HEP given as of last visit to maintain and progress current level of function    Baseline   Ongoing pt continues to perform exercises provided.    Time  6    Period  Weeks    Status  On-going            Plan - 10/07/19 1045    Clinical Impression Statement  Exercises during sessions have been overall well-tolerated but status for therapy progression impacted by pain/soreness as noted in subjective. Backed off exercise progression and added STM to help address myofascial pain. Updated HEP but limited to verbal progression as clinic power went out at end of session so unable to print handout-will provide at next visit.    Personal Factors and Comorbidities  Comorbidity 3+    Comorbidities  hx or Anxiety/ depression and sickle cell trait    Examination-Activity Limitations  Lift;Reach Overhead    Examination-Participation Restrictions  Meal Prep;Laundry    Stability/Clinical Decision Making  Evolving/Moderate complexity    Clinical Decision Making  Moderate    Rehab Potential  Good    PT Frequency  2x / week    PT Duration  6 weeks    PT Treatment/Interventions  ADLs/Self Care Home Management;Electrical Stimulation;Cryotherapy;Iontophoresis 4mg /ml Dexamethasone;Moist Heat;Ultrasound;Therapeutic activities;Therapeutic exercise;Neuromuscular re-education;Patient/family education;Manual techniques;Passive range of motion;Taping;Vasopneumatic Device    PT Next Visit Plan  Monitor pain, continue shoulder AROM and strengthening progression as tolerated, manual/PROM for tightness as needed    PT Home Exercise Plan  Shoulder flexion in standing, abduction/scaption in standing vs. sidelying, Theraband ER/IR, row-will further update next session as appropriate including stretches-unable at end of session today due to power outage    Consulted and Agree with Plan of Care  Patient       Patient will benefit from skilled therapeutic intervention in order to improve the following deficits and impairments:  Improper body mechanics, Increased muscle spasms, Decreased strength, Pain, Impaired UE  functional use, Decreased endurance, Decreased scar mobility, Decreased activity tolerance, Postural dysfunction, Decreased range of motion, Increased edema  Visit Diagnosis: H/O total shoulder replacement, right  Chronic right shoulder pain  Muscle weakness (generalized)  Localized edema     Problem List Patient Active Problem List   Diagnosis Date Noted  . Fever postop 07/01/2019  . Avascular necrosis of head of humerus ([place]) 06/29/2019  . Constipation due to slow transit 12/18/2018  . Constipation   . Choledocholithiasis 12/14/2018  . RUQ abdominal pain   . Trichimoniasis 05/03/2015  . Pap smear for cervical cancer screening 05/02/2015  . Perimenopausal 05/02/2015  . Vaginal discharge 05/02/2015  . Bell's palsy 02/28/2015  . Insomnia 12/26/2013  . Cystic breast 12/26/2013  . Tobacco abuse 09/21/2013  . Chest pain 08/26/2013  . Thrombocytopenia, unspecified ([place]) 08/26/2013  . Cardiomegaly 08/26/2013  . Sickle cell trait ([place]) 08/26/2013  . Abnormal CT of the chest 08/26/2013  . Depression 08/26/2013  . Anemia, unspecified 08/26/2013    Beaulah Dinning, PT, DPT 10/07/19 10:51 AM  [facility] [address] [place], [place], [address] Phone: [phone]   Fax:  [phone]  Name: Jeanie Marcoux MRN: [id number] Date  of Birth: 08-08-1971

## 2019-10-14 ENCOUNTER — Encounter: Payer: Self-pay | Admitting: Physical Therapy

## 2019-10-14 ENCOUNTER — Ambulatory Visit: Payer: Medicaid Other | Attending: Family Medicine | Admitting: Physical Therapy

## 2019-10-14 ENCOUNTER — Other Ambulatory Visit: Payer: Self-pay

## 2019-10-14 DIAGNOSIS — M6281 Muscle weakness (generalized): Secondary | ICD-10-CM | POA: Diagnosis present

## 2019-10-14 DIAGNOSIS — G8929 Other chronic pain: Secondary | ICD-10-CM | POA: Insufficient documentation

## 2019-10-14 DIAGNOSIS — Z96611 Presence of right artificial shoulder joint: Secondary | ICD-10-CM | POA: Diagnosis not present

## 2019-10-14 DIAGNOSIS — M25511 Pain in right shoulder: Secondary | ICD-10-CM | POA: Insufficient documentation

## 2019-10-14 DIAGNOSIS — R600 Localized edema: Secondary | ICD-10-CM | POA: Diagnosis present

## 2019-10-14 NOTE — Therapy (Signed)
[place], [place], [address] Phone: [phone]   Fax:  [phone]  Physical Therapy Treatment  Patient Details  Name: Tina Mayo MRN: [id number] Date of Birth: Aug 10, 1971 Referring Provider (PT): Marchia Bond MD   Encounter Date: 10/14/2019  PT End of Session - 10/14/19 0943    Visit Number  9    Number of Visits  17    Date for PT Re-Evaluation  10/28/19    Authorization Type  MCD    Authorization Time Period  09/20/19-10/31/19    Authorization - Visit Number  4    Authorization - Number of Visits  12    PT Start Time  0928    PT Stop Time  1020    PT Time Calculation (min)  52 min    Activity Tolerance  Patient tolerated treatment well    Behavior During Therapy  [facility] for tasks assessed/performed       Past Medical History:  Diagnosis Date  . Anxiety   . Asthma   . Avascular necrosis of head of humerus ([place]) 06/29/2019  . Bell's palsy   . Depression   . Sickle cell trait [facility])     Past Surgical History:  Procedure Laterality Date  . benign breast tumor removed     2006  . BREAST BIOPSY Right 2014   benign  . BREAST EXCISIONAL BIOPSY Right 2007   benign   . CHOLECYSTECTOMY N/A 12/16/2018   Procedure: LAPAROSCOPIC CHOLECYSTECTOMY;  Surgeon: Clovis Riley, MD;  Location: [place];  Service: General;  Laterality: N/A;  . right ankle surgery     2010  . TOTAL SHOULDER ARTHROPLASTY Right 06/29/2019   Procedure: TOTAL SHOULDER ARTHROPLASTY;  Surgeon: Marchia Bond, MD;  [facility];  Service: Orthopedics;  Laterality: Right;  . TUBAL LIGATION      There were no vitals filed for this visit.  Subjective Assessment - 10/14/19 0938    Subjective  No pain pre-tx. No new complaints or concerns this AM.    Currently in Pain?  No/denies                       [facility] Adult PT Treatment/Exercise - 10/14/19 0001      Shoulder Exercises: Sidelying   External  Rotation  AROM;Strengthening;Right;20 reps    External Rotation Weight (lbs)  1    ABduction  AROM;Strengthening;Right;20 reps    ABduction Weight (lbs)  1      Shoulder Exercises: Standing   External Rotation  AROM;Strengthening;Right;20 reps    Theraband Level (Shoulder External Rotation)  Level 2 (Red)    Internal Rotation  AROM;Strengthening;Right;20 reps    Theraband Level (Shoulder Internal Rotation)  Level 2 (Red)    Flexion Limitations  2x10 full can to 90 deg with 2 lbs.    ABduction  AROM;Right;20 reps   plane of scaption   Shoulder ABduction Weight (lbs)  1    Extension  AROM;Strengthening;Both;20 reps    Theraband Level (Shoulder Extension)  Level 3 (Green)    Row  AROM;Strengthening;Both;20 reps    Theraband Level (Shoulder Row)  Level 3 (Green)      Shoulder Exercises: ROM/Strengthening   UBE (Upper Arm Bike)  L1 x 5 min    1/2 fw and rev     Shoulder Exercises: Stretch   Other Shoulder Stretches  cross body horizontal adduction stretch 20 sec x 3    Other Shoulder Stretches  IR stretch behind back with left hand assist 10 sec x 5, sleeper stretch 20 sec x 3      Moist Heat Therapy   Number Minutes Moist Heat  10 Minutes    Moist Heat Location  Shoulder      Manual Therapy   Soft tissue mobilization  STM right posterior shoulder/scapular region, gentle STM right anterior deltoid and bicep    Passive ROM  4-way PROM   IR focus              PT Short Term Goals - 09/09/19 1437      PT SHORT TERM GOAL #1   Title  pt to be I with inital HEP    Baseline  Pt is independent with her home HEP    Time  3    Period  Weeks    Status  Achieved      PT SHORT TERM GOAL #2   Title  reduce pain and inflammation in the R shoulder via RICE and HEP    Baseline  Pt pain is 0/10 this session    Time  3    Period  Weeks    Status  Achieved        PT Long Term Goals - 10/07/19 1049      PT LONG TERM GOAL #1   Title  increase R shoulder flexion/ abduction to  >/= 120 degrees to maximize shoulder function work Sport and exercise psychologist and potential work related activities with </= 2/10 pain    Baseline  flexion 130 deg, abduction 100 deg    Time  6    Period  Weeks    Status  On-going      PT LONG TERM GOAL #2   Title  increaes shoulder IR/ ER to [facility] compared bil with </= 2/10 pain for donning/ doffing clothing, personal hygiene and ADLs    Baseline  IR reach to gluteals, ER reach to C7 with pain 6/10 today    Time  6    Period  Weeks    Status  On-going      PT LONG TERM GOAL #3   Title  pt to be able to lift and lower >/= 10# to and from an elevated shoulder and push/ pull >/= 20# with </=2/10 pain for functional strength    Baseline  Pt is progressing and able to use red and green bands for resistance for shoulder strengthening.    Time  6    Period  Weeks    Status  On-going      PT LONG TERM GOAL #4   Title  increaes QuickDash score to </= 40 to demo improvement in shoulder function    Baseline  inital score 79.55, 54.55 score 09/09/2019    Time  6    Period  Weeks    Status  On-going      PT LONG TERM GOAL #5   Title  pt to be I with all HEP given as of last visit to maintain and progress current level of function    Baseline  Ongoing pt continues to perform exercises provided.    Time  6    Period  Weeks    Status  On-going            Plan - 10/14/19 0948    Clinical Impression Statement  Fatigues easily with weakness in right shoulder scaption/abduction>flexion. Cotinued tendency for shoulder shrug with right shoulder AROM requiring min-mod cues to correct.  For ROM most limited with reaching behind back so added stretches to HEP to help address.    Personal Factors and Comorbidities  Comorbidity 3+    Comorbidities  hx or Anxiety/ depression and sickle cell trait    Examination-Activity Limitations  Lift;Reach Overhead    Examination-Participation Restrictions  Meal Prep;Laundry    Stability/Clinical Decision Making  Evolving/Moderate  complexity    Clinical Decision Making  Moderate    Rehab Potential  Good    PT Frequency  2x / week    PT Duration  6 weeks    PT Treatment/Interventions  ADLs/Self Care Home Management;Electrical Stimulation;Cryotherapy;Iontophoresis 4mg /ml Dexamethasone;Moist Heat;Ultrasound;Therapeutic activities;Therapeutic exercise;Neuromuscular re-education;Patient/family education;Manual techniques;Passive range of motion;Taping;Vasopneumatic Device    PT Next Visit Plan  Monitor pain, continue shoulder AROM and strengthening progression as tolerated, manual/PROM for tightness as needed    PT Home Exercise Plan  Shoulder flexion in standing, abduction/scaption in standing vs. sidelying, Theraband ER/IR, row, extension, IR stretch with right arm behind back vs. sleeper stretch, cross body horiz. adduction stretch    Consulted and Agree with Plan of Care  Patient       Patient will benefit from skilled therapeutic intervention in order to improve the following deficits and impairments:  Improper body mechanics, Increased muscle spasms, Decreased strength, Pain, Impaired UE functional use, Decreased endurance, Decreased scar mobility, Decreased activity tolerance, Postural dysfunction, Decreased range of motion, Increased edema  Visit Diagnosis: H/O total shoulder replacement, right  Chronic right shoulder pain  Muscle weakness (generalized)  Localized edema     Problem List Patient Active Problem List   Diagnosis Date Noted  . Fever postop 07/01/2019  . Avascular necrosis of head of humerus ([place]) 06/29/2019  . Constipation due to slow transit 12/18/2018  . Constipation   . Choledocholithiasis 12/14/2018  . RUQ abdominal pain   . Trichimoniasis 05/03/2015  . Pap smear for cervical cancer screening 05/02/2015  . Perimenopausal 05/02/2015  . Vaginal discharge 05/02/2015  . Bell's palsy 02/28/2015  . Insomnia 12/26/2013  . Cystic breast 12/26/2013  . Tobacco abuse 09/21/2013  . Chest pain  08/26/2013  . Thrombocytopenia, unspecified ([place]) 08/26/2013  . Cardiomegaly 08/26/2013  . Sickle cell trait ([place]) 08/26/2013  . Abnormal CT of the chest 08/26/2013  . Depression 08/26/2013  . Anemia, unspecified 08/26/2013    Beaulah Dinning, PT, DPT 10/14/19 10:12 AM  [place] [facility] [address] [place], [place], [address] Phone: [phone]   Fax:  [phone]  Name: Tina Mayo MRN: [id number] Date of Birth: July 20, 1971

## 2019-10-15 ENCOUNTER — Encounter: Payer: Self-pay | Admitting: Physical Therapy

## 2019-10-15 ENCOUNTER — Ambulatory Visit: Payer: Medicaid Other | Admitting: Physical Therapy

## 2019-10-15 DIAGNOSIS — G8929 Other chronic pain: Secondary | ICD-10-CM

## 2019-10-15 DIAGNOSIS — Z96611 Presence of right artificial shoulder joint: Secondary | ICD-10-CM

## 2019-10-15 DIAGNOSIS — M6281 Muscle weakness (generalized): Secondary | ICD-10-CM

## 2019-10-15 DIAGNOSIS — R600 Localized edema: Secondary | ICD-10-CM

## 2019-10-15 NOTE — Therapy (Signed)
[place], [place], [address] Phone: [phone]   Fax:  [phone]  Physical Therapy Treatment  Patient Details  Name: Tina Mayo MRN: [id number] Date of Birth: 02/26/1971 Referring Provider (PT): Marchia Bond MD   Encounter Date: 10/15/2019  PT End of Session - 10/15/19 1012    Visit Number  10    Number of Visits  17    Date for PT Re-Evaluation  10/28/19    Authorization Type  MCD    Authorization Time Period  09/20/19-10/31/19    Authorization - Visit Number  5    Authorization - Number of Visits  12    PT Start Time  0929    PT Stop Time  1020    PT Time Calculation (min)  51 min    Activity Tolerance  Patient tolerated treatment well    Behavior During Therapy  [facility] for tasks assessed/performed       Past Medical History:  Diagnosis Date  . Anxiety   . Asthma   . Avascular necrosis of head of humerus ([place]) 06/29/2019  . Bell's palsy   . Depression   . Sickle cell trait [facility])     Past Surgical History:  Procedure Laterality Date  . benign breast tumor removed     2006  . BREAST BIOPSY Right 2014   benign  . BREAST EXCISIONAL BIOPSY Right 2007   benign   . CHOLECYSTECTOMY N/A 12/16/2018   Procedure: LAPAROSCOPIC CHOLECYSTECTOMY;  Surgeon: Clovis Riley, MD;  Location: [place];  Service: General;  Laterality: N/A;  . right ankle surgery     2010  . TOTAL SHOULDER ARTHROPLASTY Right 06/29/2019   Procedure: TOTAL SHOULDER ARTHROPLASTY;  Surgeon: Marchia Bond, MD;  [facility];  Service: Orthopedics;  Laterality: Right;  . TUBAL LIGATION      There were no vitals filed for this visit.  Subjective Assessment - 10/15/19 1011    Subjective  "Same thing different day." No new complaints or concerns this AM.    Currently in Pain?  No/denies         [facility] PT Assessment - 10/15/19 0001      Strength   Right Shoulder Flexion  4/5    Right Shoulder ABduction  4/5    Right Shoulder Internal Rotation  5/5    Right Shoulder External Rotation  4/5                   [facility] Adult PT Treatment/Exercise - 10/15/19 0001      Shoulder Exercises: Supine   Flexion  AROM;Strengthening;Right;20 reps    Shoulder Flexion Weight (lbs)  2    Flexion Limitations  short arc    Other Supine Exercises  supine wand flexion elbows extended with 2 lb. weight on wand 2x10      Shoulder Exercises: Sidelying   External Rotation  AROM;Strengthening;Right;20 reps    External Rotation Weight (lbs)  1    ABduction  AROM;Strengthening;Right;20 reps    ABduction Weight (lbs)  1      Shoulder Exercises: Standing   Extension  AROM;Strengthening;Right;20 reps    Extension Weight (lbs)  2 lbs.    Row  AROM;Strengthening;Right;20 reps   standing DB row   Row Weight (lbs)  3 lbs.      Moist Heat Therapy   Number Minutes Moist Heat  10 Minutes    Moist Heat Location  Shoulder  Manual Therapy   Soft tissue mobilization  STM right posterior shoulder/scapular region, upper trapezius    Passive ROM  4-way PROM   IR focus              PT Short Term Goals - 09/09/19 1437      PT SHORT TERM GOAL #1   Title  pt to be I with inital HEP    Baseline  Pt is independent with her home HEP    Time  3    Period  Weeks    Status  Achieved      PT SHORT TERM GOAL #2   Title  reduce pain and inflammation in the R shoulder via RICE and HEP    Baseline  Pt pain is 0/10 this session    Time  3    Period  Weeks    Status  Achieved        PT Long Term Goals - 10/07/19 1049      PT LONG TERM GOAL #1   Title  increase R shoulder flexion/ abduction to >/= 120 degrees to maximize shoulder function work Sport and exercise psychologist and potential work related activities with </= 2/10 pain    Baseline  flexion 130 deg, abduction 100 deg    Time  6    Period  Weeks    Status  On-going      PT LONG TERM GOAL #2   Title  increaes shoulder IR/ ER to [facility] compared bil with </= 2/10 pain for  donning/ doffing clothing, personal hygiene and ADLs    Baseline  IR reach to gluteals, ER reach to C7 with pain 6/10 today    Time  6    Period  Weeks    Status  On-going      PT LONG TERM GOAL #3   Title  pt to be able to lift and lower >/= 10# to and from an elevated shoulder and push/ pull >/= 20# with </=2/10 pain for functional strength    Baseline  Pt is progressing and able to use red and green bands for resistance for shoulder strengthening.    Time  6    Period  Weeks    Status  On-going      PT LONG TERM GOAL #4   Title  increaes QuickDash score to </= 40 to demo improvement in shoulder function    Baseline  inital score 79.55, 54.55 score 09/09/2019    Time  6    Period  Weeks    Status  On-going      PT LONG TERM GOAL #5   Title  pt to be I with all HEP given as of last visit to maintain and progress current level of function    Baseline  Ongoing pt continues to perform exercises provided.    Time  6    Period  Weeks    Status  On-going            Plan - 10/15/19 1012    Clinical Impression Statement  More focus manual therapy/ROM today given exercise focus yesterday and previous issues with excessive soreness with treatment 2 days in a row. See flowsheet re: strength-pt. making gradual progress to improve with this and also decreased tightness in IR ROM noted compared with recent status. Ongoing PT needed for further progress to improve functional use of RUE.    Personal Factors and Comorbidities  Comorbidity 3+    Comorbidities  hx or Anxiety/  depression and sickle cell trait    Examination-Activity Limitations  Lift;Reach Overhead    Examination-Participation Restrictions  Meal Prep;Laundry    Stability/Clinical Decision Making  Evolving/Moderate complexity    Clinical Decision Making  Moderate    Rehab Potential  Good    PT Frequency  2x / week    PT Duration  6 weeks    PT Treatment/Interventions  ADLs/Self Care Home Management;Electrical  Stimulation;Cryotherapy;Iontophoresis 4mg /ml Dexamethasone;Moist Heat;Ultrasound;Therapeutic activities;Therapeutic exercise;Neuromuscular re-education;Patient/family education;Manual techniques;Passive range of motion;Taping;Vasopneumatic Device    PT Next Visit Plan  Monitor pain, continue shoulder AROM and strengthening progression as tolerated, manual/PROM for tightness as needed    PT Home Exercise Plan  Shoulder flexion in standing, abduction/scaption in standing vs. sidelying, Theraband ER/IR, row, extension, IR stretch with right arm behind back vs. sleeper stretch, cross body horiz. adduction stretch    Consulted and Agree with Plan of Care  Patient       Patient will benefit from skilled therapeutic intervention in order to improve the following deficits and impairments:  Improper body mechanics, Increased muscle spasms, Decreased strength, Pain, Impaired UE functional use, Decreased endurance, Decreased scar mobility, Decreased activity tolerance, Postural dysfunction, Decreased range of motion, Increased edema  Visit Diagnosis: H/O total shoulder replacement, right  Chronic right shoulder pain  Muscle weakness (generalized)  Localized edema     Problem List Patient Active Problem List   Diagnosis Date Noted  . Fever postop 07/01/2019  . Avascular necrosis of head of humerus ([place]) 06/29/2019  . Constipation due to slow transit 12/18/2018  . Constipation   . Choledocholithiasis 12/14/2018  . RUQ abdominal pain   . Trichimoniasis 05/03/2015  . Pap smear for cervical cancer screening 05/02/2015  . Perimenopausal 05/02/2015  . Vaginal discharge 05/02/2015  . Bell's palsy 02/28/2015  . Insomnia 12/26/2013  . Cystic breast 12/26/2013  . Tobacco abuse 09/21/2013  . Chest pain 08/26/2013  . Thrombocytopenia, unspecified ([place]) 08/26/2013  . Cardiomegaly 08/26/2013  . Sickle cell trait ([place]) 08/26/2013  . Abnormal CT of the chest 08/26/2013  . Depression 08/26/2013  .  Anemia, unspecified 08/26/2013    Beaulah Dinning, PT, DPT 10/15/19 10:15 AM  [place] [address] [place], [place], [address] Phone: [phone]   Fax:  [phone]  Name: Mawada Dewaard MRN: [id number] Date of Birth: [birth year]

## 2019-10-19 ENCOUNTER — Ambulatory Visit: Payer: Medicaid Other | Admitting: Physical Therapy

## 2019-10-19 ENCOUNTER — Encounter: Payer: Self-pay | Admitting: Physical Therapy

## 2019-10-19 ENCOUNTER — Other Ambulatory Visit: Payer: Self-pay

## 2019-10-19 DIAGNOSIS — Z96611 Presence of right artificial shoulder joint: Secondary | ICD-10-CM

## 2019-10-19 DIAGNOSIS — M25511 Pain in right shoulder: Secondary | ICD-10-CM

## 2019-10-19 DIAGNOSIS — G8929 Other chronic pain: Secondary | ICD-10-CM

## 2019-10-19 DIAGNOSIS — M6281 Muscle weakness (generalized): Secondary | ICD-10-CM

## 2019-10-19 DIAGNOSIS — R600 Localized edema: Secondary | ICD-10-CM

## 2019-10-19 NOTE — Therapy (Signed)
[place], [place], [address] Phone: [phone]   Fax:  [phone]  Physical Therapy Treatment  Patient Details  Name: Tina Mayo MRN: [id number] Date of Birth: 1971-02-26 Referring Provider (PT): Marchia Bond MD   Encounter Date: 10/19/2019  PT End of Session - 10/19/19 0845    Visit Number  11    Number of Visits  17    Date for PT Re-Evaluation  10/28/19    Authorization Type  MCD    Authorization Time Period  09/20/19-10/31/19    Authorization - Visit Number  6    Authorization - Number of Visits  12    PT Start Time  0848    PT Stop Time  0928    PT Time Calculation (min)  40 min    Activity Tolerance  Patient tolerated treatment well       Past Medical History:  Diagnosis Date  . Anxiety   . Asthma   . Avascular necrosis of head of humerus ([place]) 06/29/2019  . Bell's palsy   . Depression   . Sickle cell trait [facility])     Past Surgical History:  Procedure Laterality Date  . benign breast tumor removed     2006  . BREAST BIOPSY Right 2014   benign  . BREAST EXCISIONAL BIOPSY Right 2007   benign   . CHOLECYSTECTOMY N/A 12/16/2018   Procedure: LAPAROSCOPIC CHOLECYSTECTOMY;  Surgeon: Clovis Riley, MD;  Location: [place];  Service: General;  Laterality: N/A;  . right ankle surgery     2010  . TOTAL SHOULDER ARTHROPLASTY Right 06/29/2019   Procedure: TOTAL SHOULDER ARTHROPLASTY;  Surgeon: Marchia Bond, MD;  [facility];  Service: Orthopedics;  Laterality: Right;  . TUBAL LIGATION      There were no vitals filed for this visit.  Subjective Assessment - 10/19/19 0850    Subjective  "I am have been doing fine, I've been using heat and muscle relaxer to calm down"    Currently in Pain?  Yes    Pain Score  0-No pain   last took aspirin at 6:30   Pain Orientation  Right    Pain Type  Surgical pain;Chronic pain    Aggravating Factors   laying on the shoulder, at the end  of the day tightness    Pain Relieving Factors  medication, shower/ heat                       [facility] Adult PT Treatment/Exercise - 10/19/19 0001      Shoulder Exercises: Sidelying   Other Sidelying Exercises  shoulder abduction 2 x 10   combined with scapular upward assist     Shoulder Exercises: Standing   Internal Rotation  AROM;Strengthening;Right;20 reps   focus on eccentric loading    Theraband Level (Shoulder Internal Rotation)  Level 3 (Green)    Internal Rotation Limitations  verbal cues required to avoid alloing shoulder to roll forward    Other Standing Exercises  reaching into top cabinet shelf 1 x 10   cues for proper form     Shoulder Exercises: ROM/Strengthening   Other ROM/Strengthening Exercises  towel IR pulling across the low back       Shoulder Exercises: Stretch   Other Shoulder Stretches  3-way bicep stretch against the wall    Other Shoulder Stretches  IR stretch behind back with left hand assist 10 sec x 5, sleeper stretch  20 sec x 3      Manual Therapy   Manual Therapy  Scapular mobilization    Joint Mobilization  prone PA GHJ mobs grade III    Soft tissue mobilization  MTPR along bicep brachii, and middle deltoid x 2 ea.    Scapular Mobilization  scapular upward rotataiton grade III in L sidelying             PT Education - 10/19/19 0930    Education Details  updated HEP for shoulde towel IR, sleeper stretch and bicep stretch    Person(s) Educated  Patient    Methods  Explanation;Verbal cues;Handout    Comprehension  Verbalized understanding;Verbal cues required       PT Short Term Goals - 09/09/19 1437      PT SHORT TERM GOAL #1   Title  pt to be I with inital HEP    Baseline  Pt is independent with her home HEP    Time  3    Period  Weeks    Status  Achieved      PT SHORT TERM GOAL #2   Title  reduce pain and inflammation in the R shoulder via RICE and HEP    Baseline  Pt pain is 0/10 this session    Time  3     Period  Weeks    Status  Achieved        PT Long Term Goals - 10/07/19 1049      PT LONG TERM GOAL #1   Title  increase R shoulder flexion/ abduction to >/= 120 degrees to maximize shoulder function work Sport and exercise psychologist and potential work related activities with </= 2/10 pain    Baseline  flexion 130 deg, abduction 100 deg    Time  6    Period  Weeks    Status  On-going      PT LONG TERM GOAL #2   Title  increaes shoulder IR/ ER to [facility] compared bil with </= 2/10 pain for donning/ doffing clothing, personal hygiene and ADLs    Baseline  IR reach to gluteals, ER reach to C7 with pain 6/10 today    Time  6    Period  Weeks    Status  On-going      PT LONG TERM GOAL #3   Title  pt to be able to lift and lower >/= 10# to and from an elevated shoulder and push/ pull >/= 20# with </=2/10 pain for functional strength    Baseline  Pt is progressing and able to use red and green bands for resistance for shoulder strengthening.    Time  6    Period  Weeks    Status  On-going      PT LONG TERM GOAL #4   Title  increaes QuickDash score to </= 40 to demo improvement in shoulder function    Baseline  inital score 79.55, 54.55 score 09/09/2019    Time  6    Period  Weeks    Status  On-going      PT LONG TERM GOAL #5   Title  pt to be I with all HEP given as of last visit to maintain and progress current level of function    Baseline  Ongoing pt continues to perform exercises provided.    Time  6    Period  Weeks    Status  On-going            Plan -  10/19/19 1016    Clinical Impression Statement  continued working on ROM which she has functional mobility but exhibits general weakness limiting AROM in gravity dependent positions. continued working on ROM expeically for abduction and reaching behind the back. She noted decreaesd pain and tightness end of session and declined modalities.       Patient will benefit from skilled therapeutic intervention in order to improve the following  deficits and impairments:     Visit Diagnosis: H/O total shoulder replacement, right  Chronic right shoulder pain  Muscle weakness (generalized)  Localized edema     Problem List Patient Active Problem List   Diagnosis Date Noted  . Fever postop 07/01/2019  . Avascular necrosis of head of humerus ([place]) 06/29/2019  . Constipation due to slow transit 12/18/2018  . Constipation   . Choledocholithiasis 12/14/2018  . RUQ abdominal pain   . Trichimoniasis 05/03/2015  . Pap smear for cervical cancer screening 05/02/2015  . Perimenopausal 05/02/2015  . Vaginal discharge 05/02/2015  . Bell's palsy 02/28/2015  . Insomnia 12/26/2013  . Cystic breast 12/26/2013  . Tobacco abuse 09/21/2013  . Chest pain 08/26/2013  . Thrombocytopenia, unspecified ([place]) 08/26/2013  . Cardiomegaly 08/26/2013  . Sickle cell trait ([place]) 08/26/2013  . Abnormal CT of the chest 08/26/2013  . Depression 08/26/2013  . Anemia, unspecified 08/26/2013   Starr Lake PT, DPT, LAT, ATC  10/19/19  10:19 AM      [place] [facility] [address] [place], [place], [address] Phone: [phone]   Fax:  [phone]  Name: Jordin Sinon MRN: [id number] Date of Birth: [birth year]

## 2019-10-21 ENCOUNTER — Ambulatory Visit: Payer: Medicaid Other | Admitting: Physical Therapy

## 2019-10-26 ENCOUNTER — Other Ambulatory Visit: Payer: Self-pay

## 2019-10-26 ENCOUNTER — Ambulatory Visit: Payer: Medicaid Other | Admitting: Physical Therapy

## 2019-10-26 ENCOUNTER — Encounter: Payer: Self-pay | Admitting: Physical Therapy

## 2019-10-26 DIAGNOSIS — R600 Localized edema: Secondary | ICD-10-CM

## 2019-10-26 DIAGNOSIS — M6281 Muscle weakness (generalized): Secondary | ICD-10-CM

## 2019-10-26 DIAGNOSIS — Z96611 Presence of right artificial shoulder joint: Secondary | ICD-10-CM | POA: Diagnosis not present

## 2019-10-26 DIAGNOSIS — G8929 Other chronic pain: Secondary | ICD-10-CM

## 2019-10-26 DIAGNOSIS — M25511 Pain in right shoulder: Secondary | ICD-10-CM

## 2019-10-26 NOTE — Therapy (Signed)
[place], [place], [address] Phone: [phone]   Fax:  [phone]  Physical Therapy Treatment  Patient Details  Name: Tina Mayo MRN: [id number] Date of Birth: 1971-02-11 Referring Provider (PT): Marchia Bond MD   Encounter Date: 10/26/2019  PT End of Session - 10/26/19 0927    Visit Number  12    Number of Visits  17    Date for PT Re-Evaluation  10/28/19    Authorization Type  MCD    Authorization Time Period  09/20/19-10/31/19    Authorization - Visit Number  7    Authorization - Number of Visits  12    PT Start Time  0845    PT Stop Time  0933    PT Time Calculation (min)  48 min    Activity Tolerance  Patient tolerated treatment well       Past Medical History:  Diagnosis Date  . Anxiety   . Asthma   . Avascular necrosis of head of humerus ([place]) 06/29/2019  . Bell's palsy   . Depression   . Sickle cell trait [facility])     Past Surgical History:  Procedure Laterality Date  . benign breast tumor removed     2006  . BREAST BIOPSY Right 2014   benign  . BREAST EXCISIONAL BIOPSY Right 2007   benign   . CHOLECYSTECTOMY N/A 12/16/2018   Procedure: LAPAROSCOPIC CHOLECYSTECTOMY;  Surgeon: Clovis Riley, MD;  Location: [place];  Service: General;  Laterality: N/A;  . right ankle surgery     2010  . TOTAL SHOULDER ARTHROPLASTY Right 06/29/2019   Procedure: TOTAL SHOULDER ARTHROPLASTY;  Surgeon: Marchia Bond, MD;  Location: [facility];  Service: Orthopedics;  Laterality: Right;  . TUBAL LIGATION      There were no vitals filed for this visit.  Subjective Assessment - 10/26/19 0847    Subjective  " I am doing better today, although I am having a rough morning"    Patient Stated Goals  to get more function in the R shoulder,    Currently in Pain?  No/denies         [facility] PT Assessment - 10/26/19 0001      Assessment   Medical Diagnosis  S/p R total shoulder    Referring  Provider (PT)  Marchia Bond MD                   [facility] Adult PT Treatment/Exercise - 10/26/19 0929      Self-Care   Self-Care  Posture    Posture  proper posture/ positioning in supine to reduce stress on the R shoulder      Shoulder Exercises: Supine   Other Supine Exercises  bicep curl 2 x 10 with red theraband focusing on eccentric portion       Shoulder Exercises: Standing   External Rotation  Strengthening;Right;15 reps;Theraband    Theraband Level (Shoulder External Rotation)  Level 3 (Green)    Internal Rotation  AROM;Strengthening;Right;20 reps    Theraband Level (Shoulder Internal Rotation)  Level 3 (Green)    Flexion  Strengthening;Right;10 reps   x 2 sets working in to top shelf of cabinet   Shoulder Flexion Weight (lbs)  2      Shoulder Exercises: ROM/Strengthening   Nustep  L5 x 6 min UE/LE      Shoulder Exercises: Stretch   Other Shoulder Stretches  3-way bicep stretch against the wall   30  sec ea. position     Modalities   Modalities  Cryotherapy      Moist Heat Therapy   Number Minutes Moist Heat  10 Minutes    Moist Heat Location  Shoulder   in supine propped in positoin to reduce tension on shoulder     Manual Therapy   Manual Therapy  Soft tissue mobilization    Manual therapy comments  MTPR along the bicep brachii x 2 ea.     Joint Mobilization  prone PA GHJ mobs grade III    Soft tissue mobilization  IASTM techniques along the biceps brachii               PT Short Term Goals - 09/09/19 1437      PT SHORT TERM GOAL #1   Title  pt to be I with inital HEP    Baseline  Pt is independent with her home HEP    Time  3    Period  Weeks    Status  Achieved      PT SHORT TERM GOAL #2   Title  reduce pain and inflammation in the R shoulder via RICE and HEP    Baseline  Pt pain is 0/10 this session    Time  3    Period  Weeks    Status  Achieved        PT Long Term Goals - 10/07/19 1049      PT LONG TERM GOAL #1   Title   increase R shoulder flexion/ abduction to >/= 120 degrees to maximize shoulder function work Sport and exercise psychologist and potential work related activities with </= 2/10 pain    Baseline  flexion 130 deg, abduction 100 deg    Time  6    Period  Weeks    Status  On-going      PT LONG TERM GOAL #2   Title  increaes shoulder IR/ ER to [facility] compared bil with </= 2/10 pain for donning/ doffing clothing, personal hygiene and ADLs    Baseline  IR reach to gluteals, ER reach to C7 with pain 6/10 today    Time  6    Period  Weeks    Status  On-going      PT LONG TERM GOAL #3   Title  pt to be able to lift and lower >/= 10# to and from an elevated shoulder and push/ pull >/= 20# with </=2/10 pain for functional strength    Baseline  Pt is progressing and able to use red and green bands for resistance for shoulder strengthening.    Time  6    Period  Weeks    Status  On-going      PT LONG TERM GOAL #4   Title  increaes QuickDash score to </= 40 to demo improvement in shoulder function    Baseline  inital score 79.55, 54.55 score 09/09/2019    Time  6    Period  Weeks    Status  On-going      PT LONG TERM GOAL #5   Title  pt to be I with all HEP given as of last visit to maintain and progress current level of function    Baseline  Ongoing pt continues to perform exercises provided.    Time  6    Period  Weeks    Status  On-going            Plan - 10/26/19 [id number]  Clinical Impression Statement  pt reported having increased shoulder pain limited her sleeping last night, but notes minimal soreness today. discussed sleeping positions to reduce tension / pressure on the shoulder.  continued working on functional shoulder strengtheing and scapular stability which she did well with. following MTPR along biceps brachii she noted relief of tension. utilized ice end of session to calm down soreness end of session.    PT Treatment/Interventions  ADLs/Self Care Home Management;Electrical  Stimulation;Cryotherapy;Iontophoresis 4mg /ml Dexamethasone;Moist Heat;Ultrasound;Therapeutic activities;Therapeutic exercise;Neuromuscular re-education;Patient/family education;Manual techniques;Passive range of motion;Taping;Vasopneumatic Device    PT Next Visit Plan  ERO next session/ MCD resubmission :Monitor pain, continue shoulder AROM and strengthening progression as tolerated, manual/PROM for tightness as needed    PT Home Exercise Plan  Shoulder flexion in standing, abduction/scaption in standing vs. sidelying, Theraband ER/IR, row, extension, IR stretch with right arm behind back vs. sleeper stretch, cross body horiz. adduction stretch    Consulted and Agree with Plan of Care  Patient       Patient will benefit from skilled therapeutic intervention in order to improve the following deficits and impairments:  Improper body mechanics, Increased muscle spasms, Decreased strength, Pain, Impaired UE functional use, Decreased endurance, Decreased scar mobility, Decreased activity tolerance, Postural dysfunction, Decreased range of motion, Increased edema  Visit Diagnosis: H/O total shoulder replacement, right  Chronic right shoulder pain  Muscle weakness (generalized)  Localized edema     Problem List Patient Active Problem List   Diagnosis Date Noted  . Fever postop 07/01/2019  . Avascular necrosis of head of humerus ([place]) 06/29/2019  . Constipation due to slow transit 12/18/2018  . Constipation   . Choledocholithiasis 12/14/2018  . RUQ abdominal pain   . Trichimoniasis 05/03/2015  . Pap smear for cervical cancer screening 05/02/2015  . Perimenopausal 05/02/2015  . Vaginal discharge 05/02/2015  . Bell's palsy 02/28/2015  . Insomnia 12/26/2013  . Cystic breast 12/26/2013  . Tobacco abuse 09/21/2013  . Chest pain 08/26/2013  . Thrombocytopenia, unspecified ([place]) 08/26/2013  . Cardiomegaly 08/26/2013  . Sickle cell trait ([place]) 08/26/2013  . Abnormal CT of the chest  08/26/2013  . Depression 08/26/2013  . Anemia, unspecified 08/26/2013    Starr Lake PT, DPT, LAT, ATC  10/26/19  9:42 AM      [facility] [address] [place], [place], [address] Phone: [phone]   Fax:  [phone]  Name: Bless Hanselman MRN: [id number] Date of Birth: 03/04/1971

## 2019-10-28 ENCOUNTER — Other Ambulatory Visit: Payer: Self-pay

## 2019-10-28 ENCOUNTER — Encounter: Payer: Self-pay | Admitting: Physical Therapy

## 2019-10-28 ENCOUNTER — Ambulatory Visit: Payer: Medicaid Other | Admitting: Physical Therapy

## 2019-10-28 DIAGNOSIS — M6281 Muscle weakness (generalized): Secondary | ICD-10-CM

## 2019-10-28 DIAGNOSIS — G8929 Other chronic pain: Secondary | ICD-10-CM

## 2019-10-28 DIAGNOSIS — Z96611 Presence of right artificial shoulder joint: Secondary | ICD-10-CM

## 2019-10-28 DIAGNOSIS — M25511 Pain in right shoulder: Secondary | ICD-10-CM

## 2019-10-28 DIAGNOSIS — R600 Localized edema: Secondary | ICD-10-CM

## 2019-10-28 NOTE — Therapy (Signed)
[place], [place], [address] Phone: [phone]   Fax:  [phone]  Physical Therapy Treatment/ERO  Patient Details  Name: Tina Mayo MRN: [id number] Date of Birth: 11-04-1971 Referring Provider (PT): Marchia Bond MD   Encounter Date: 10/28/2019  PT End of Session - 10/28/19 0846    Visit Number  13    Number of Visits  21    Date for PT Re-Evaluation  12/02/19    Authorization Type  MCD    Authorization Time Period  09/20/19-10/31/19    Authorization - Visit Number  8    Authorization - Number of Visits  12    PT Start Time  2119    PT Stop Time  0927    PT Time Calculation (min)  43 min    Activity Tolerance  Patient tolerated treatment well    Behavior During Therapy  [facility] for tasks assessed/performed       Past Medical History:  Diagnosis Date  . Anxiety   . Asthma   . Avascular necrosis of head of humerus ([place]) 06/29/2019  . Bell's palsy   . Depression   . Sickle cell trait [facility])     Past Surgical History:  Procedure Laterality Date  . benign breast tumor removed     2006  . BREAST BIOPSY Right 2014   benign  . BREAST EXCISIONAL BIOPSY Right 2007   benign   . CHOLECYSTECTOMY N/A 12/16/2018   Procedure: LAPAROSCOPIC CHOLECYSTECTOMY;  Surgeon: Clovis Riley, MD;  Location: [place];  Service: General;  Laterality: N/A;  . right ankle surgery     2010  . TOTAL SHOULDER ARTHROPLASTY Right 06/29/2019   Procedure: TOTAL SHOULDER ARTHROPLASTY;  Surgeon: Marchia Bond, MD;  [facility];  Service: Orthopedics;  Laterality: Right;  . TUBAL LIGATION      There were no vitals filed for this visit.  Subjective Assessment - 10/28/19 0905    Subjective  Still with moderate pain level but gradually improving from previous status with right shoulder function-see QuickDASH score. She continues to have limitations in particular with reaching behind back for dressing as well as  reaching into overhead cabinets her right side. See assessment.    Pertinent History  R total shoulder arthroplasty    Limitations  House hold activities;Lifting    Currently in Pain?  Yes    Pain Score  6     Pain Location  Shoulder    Pain Orientation  Right    Pain Descriptors / Indicators  Aching    Pain Type  Chronic pain;Surgical pain    Pain Onset  More than a month ago    Pain Frequency  Intermittent    Aggravating Factors   reaching/arm use, lying on right side    Pain Relieving Factors  heat, medication    Effect of Pain on Daily Activities  limits ability reaching ADLs and limits positional tolerance/sleep disturbance.         [facility] PT Assessment - 10/28/19 0001      AROM   Right Shoulder Flexion  130 Degrees    Right Shoulder ABduction  120 Degrees    Right Shoulder Internal Rotation  --   reach to gluteals   Right Shoulder External Rotation  --   reach to C7     Strength   Right Shoulder Flexion  4+/5    Right Shoulder ABduction  4/5    Right Shoulder Internal Rotation  5/5    Right Shoulder External Rotation  4+/5           Tina Mayo - 10/28/19 0001    Open a tight or new jar  No difficulty    Do heavy household chores (wash walls, wash floors)  No difficulty    Carry a shopping bag or briefcase  No difficulty    Wash your back  Severe difficulty    Use a knife to cut food  No difficulty    Recreational activities in which you take some force or impact through your arm, shoulder, or hand (golf, hammering, tennis)  No difficulty    During the past week, to what extent has your arm, shoulder or hand problem interfered with your normal social activities with family, friends, neighbors, or groups?  Not at all    During the past week, to what extent has your arm, shoulder or hand problem limited your work or other regular daily activities  Slightly    Arm, shoulder, or hand pain.  Mild    Tingling (pins and needles) in your arm, shoulder, or hand  None     Difficulty Sleeping  Mild difficulty    DASH Score  13.64 %             [facility] Adult PT Treatment/Exercise - 10/28/19 0001      Shoulder Exercises: Standing   External Rotation  Strengthening;Right;15 reps;Theraband    Theraband Level (Shoulder External Rotation)  Level 3 (Green)    Internal Rotation  AROM;Strengthening;Right;20 reps    Theraband Level (Shoulder Internal Rotation)  Level 3 (Green)    Internal Rotation Limitations  cues to avoid horizontal adduction and keep ebow at side    Flexion  AROM;Strengthening;Right;20 reps    Shoulder Flexion Weight (lbs)  2    Other Standing Exercises  [place] abduction red band 2x10    Other Standing Exercises  R Bicep eccentric 2 lbs. 2x10      Shoulder Exercises: ROM/Strengthening   Nustep  L5 x 6 min UE/LE      Manual Therapy   Joint Mobilization  AP right shoulder grade I-III    Soft tissue mobilization  STM right bicep and posterior shoulders    Passive ROM  right shoulder IR focus             PT Education - 10/28/19 0904    Education Details  POC, potential etiology bicep symptoms    Person(s) Educated  Patient    Methods  Explanation    Comprehension  Verbalized understanding       PT Short Term Goals - 10/28/19 1201      PT SHORT TERM GOAL #1   Title  pt to be I with inital HEP    Baseline  Pt is independent with her home HEP    Time  3    Period  Weeks    Status  Achieved      PT SHORT TERM GOAL #2   Title  reduce pain and inflammation in the R shoulder via RICE and HEP    Baseline  goal previously met    Time  3    Period  Weeks    Status  Achieved        PT Long Term Goals - 10/28/19 1201      PT LONG TERM GOAL #1   Title  increase R shoulder flexion/ abduction to >/= 120 (see revised goal below) degrees to maximize shoulder  function work Sport and exercise psychologist and potential work related activities with </= 2/10 pain    Baseline  previous goal met for ROM but pain >2/10, update goal for 140 deg flexion    Time   4    Period  Weeks    Status  Revised    Target Date  12/02/19      PT LONG TERM GOAL #2   Title  increaes shoulder IR/ ER to [facility] compared bil with </= 2/10 pain for donning/ doffing clothing, personal hygiene and ADLs    Baseline  IR reach to gluteals, ER reach to C7 with pain 6/10 today    Time  4    Period  Weeks    Status  On-going    Target Date  12/02/19      PT LONG TERM GOAL #3   Title  pt to be able to lift and lower >/= 10# to and from an elevated shoulder and push/ pull >/= 20# with </=2/10 pain for functional strength    Baseline  Pt is progressing with strength but still unable    Time  4    Period  Weeks    Status  On-going    Target Date  12/02/19      PT LONG TERM GOAL #4   Title  increaes QuickDash score to </= 40 to demo improvement in shoulder function    Baseline  achieved, 13.64    Time  4    Period  Weeks    Status  Achieved    Target Date  12/02/19      PT LONG TERM GOAL #5   Title  pt to be I with all HEP given as of last visit to maintain and progress current level of function    Baseline  Ongoing pt continues to perform exercises provided.    Time  4    Period  Weeks    Status  Achieved    Target Date  12/02/19            Plan - 10/28/19 1147    Clinical Impression Statement  Tina Mayo has been making slow/gradual progress with improved functional ability for her shoulder as evidenced by objective status and QuickDASH gains. She is still limited with reaching ability for dressing and chores with limited ROM for overhead activities and reaching behind her back for dressing. Strength gradually improving but still with weakness impacting ability for lifting for chores. Pt. would benefit from continued therapy for further progress to address remaining functional limitations-plan 2x/week for 4 more weeks then tentative d/c to HEP.    Personal Factors and Comorbidities  Comorbidity 3+    Comorbidities  hx or Anxiety/ depression and sickle cell trait     Examination-Activity Limitations  Lift;Reach Overhead    Examination-Participation Restrictions  Meal Prep;Laundry    Stability/Clinical Decision Making  Evolving/Moderate complexity    Clinical Decision Making  Moderate    Rehab Potential  Good    PT Frequency  2x / week    PT Duration  4 weeks    PT Treatment/Interventions  ADLs/Self Care Home Management;Electrical Stimulation;Cryotherapy;Iontophoresis 52m/ml Dexamethasone;Moist Heat;Ultrasound;Therapeutic activities;Therapeutic exercise;Neuromuscular re-education;Patient/family education;Manual techniques;Passive range of motion;Taping;Vasopneumatic Device;Dry needling    PT Next Visit Plan  Monitor pain, continue shoulder AROM and strengthening progression as tolerated, manual/PROM for tightness as needed to improve IR and flexion ,abduction ROM    PT Home Exercise Plan  Shoulder flexion in standing, abduction/scaption in standing vs. sidelying, Theraband ER/IR, row,  extension, IR stretch with right arm behind back vs. sleeper stretch, cross body horiz. adduction stretch    Consulted and Agree with Plan of Care  Patient       Patient will benefit from skilled therapeutic intervention in order to improve the following deficits and impairments:  Improper body mechanics, Increased muscle spasms, Decreased strength, Pain, Impaired UE functional use, Decreased endurance, Decreased scar mobility, Decreased activity tolerance, Postural dysfunction, Decreased range of motion, Increased edema  Visit Diagnosis: H/O total shoulder replacement, right  Chronic right shoulder pain  Muscle weakness (generalized)  Localized edema     Problem List Patient Active Problem List   Diagnosis Date Noted  . Fever postop 07/01/2019  . Avascular necrosis of head of humerus ([place]) 06/29/2019  . Constipation due to slow transit 12/18/2018  . Constipation   . Choledocholithiasis 12/14/2018  . RUQ abdominal pain   . Trichimoniasis 05/03/2015  . Pap  smear for cervical cancer screening 05/02/2015  . Perimenopausal 05/02/2015  . Vaginal discharge 05/02/2015  . Bell's palsy 02/28/2015  . Insomnia 12/26/2013  . Cystic breast 12/26/2013  . Tobacco abuse 09/21/2013  . Chest pain 08/26/2013  . Thrombocytopenia, unspecified ([place]) 08/26/2013  . Cardiomegaly 08/26/2013  . Sickle cell trait ([place]) 08/26/2013  . Abnormal CT of the chest 08/26/2013  . Depression 08/26/2013  . Anemia, unspecified 08/26/2013    Beaulah Dinning, PT, DPT 10/28/19 12:08 PM  [facility] [address] [place], [place], [address] Phone: [phone]   Fax:  [phone]  Name: Tina Mayo MRN: [id number] Date of Birth: 08-Mar-1971

## 2019-11-01 ENCOUNTER — Ambulatory Visit: Payer: Medicaid Other | Admitting: Physical Therapy

## 2019-11-03 ENCOUNTER — Ambulatory Visit: Payer: Medicaid Other | Admitting: Physical Therapy

## 2019-11-03 ENCOUNTER — Encounter: Payer: Self-pay | Admitting: Physical Therapy

## 2019-11-03 ENCOUNTER — Other Ambulatory Visit: Payer: Self-pay

## 2019-11-03 DIAGNOSIS — G8929 Other chronic pain: Secondary | ICD-10-CM

## 2019-11-03 DIAGNOSIS — Z96611 Presence of right artificial shoulder joint: Secondary | ICD-10-CM | POA: Diagnosis not present

## 2019-11-03 DIAGNOSIS — M6281 Muscle weakness (generalized): Secondary | ICD-10-CM

## 2019-11-03 DIAGNOSIS — R600 Localized edema: Secondary | ICD-10-CM

## 2019-11-03 NOTE — Therapy (Signed)
[place], [place], [address] Phone: [phone]   Fax:  [phone]  Physical Therapy Treatment  Patient Details  Name: Tina Mayo MRN: [id number] Date of Birth: Sep 14, 1971 Referring Provider (PT): Marchia Bond MD   Encounter Date: 11/03/2019  PT End of Session - 11/03/19 0901    Visit Number  14    Number of Visits  21    Date for PT Re-Evaluation  12/02/19    Authorization Type  MCD    Authorization Time Period  11/03/19-11/30/19    Authorization - Visit Number  1    Authorization - Number of Visits  8    PT Start Time  0900    PT Stop Time  0950    PT Time Calculation (min)  50 min    Activity Tolerance  Patient tolerated treatment well    Behavior During Therapy  [facility] for tasks assessed/performed       Past Medical History:  Diagnosis Date  . Anxiety   . Asthma   . Avascular necrosis of head of humerus ([place]) 06/29/2019  . Bell's palsy   . Depression   . Sickle cell trait [facility])     Past Surgical History:  Procedure Laterality Date  . benign breast tumor removed     2006  . BREAST BIOPSY Right 2014   benign  . BREAST EXCISIONAL BIOPSY Right 2007   benign   . CHOLECYSTECTOMY N/A 12/16/2018   Procedure: LAPAROSCOPIC CHOLECYSTECTOMY;  Surgeon: Clovis Riley, MD;  Location: [place];  Service: General;  Laterality: N/A;  . right ankle surgery     2010  . TOTAL SHOULDER ARTHROPLASTY Right 06/29/2019   Procedure: TOTAL SHOULDER ARTHROPLASTY;  Surgeon: Marchia Bond, MD;  [facility];  Service: Orthopedics;  Laterality: Right;  . TUBAL LIGATION      There were no vitals filed for this visit.  Subjective Assessment - 11/03/19 0903    Subjective  "It's tight because it's been cold." (weather)    Currently in Pain?  Yes    Pain Score  7     Pain Location  Shoulder    Pain Orientation  Right    Pain Descriptors / Indicators  Aching    Pain Type  Chronic pain;Surgical pain     Pain Onset  More than a month ago    Pain Frequency  Intermittent    Aggravating Factors   arm and shoulder use, lying on right side    Pain Relieving Factors  heat and medication    Effect of Pain on Daily Activities  limits ability reaching ADLs and positional tolerance                       [facility] Adult PT Treatment/Exercise - 11/03/19 0001      Shoulder Exercises: Sidelying   External Rotation  AROM;Strengthening;Right;20 reps    External Rotation Weight (lbs)  1    ABduction  AROM;Strengthening;Right;20 reps    ABduction Weight (lbs)  1      Shoulder Exercises: Standing   External Rotation  AROM;Strengthening;Right;20 reps    Theraband Level (Shoulder External Rotation)  Level 3 (Green)    Internal Rotation  AROM;Right;20 reps    Theraband Level (Shoulder Internal Rotation)  Level 3 (Green)    Flexion  AROM;Strengthening;Right;20 reps    Shoulder Flexion Weight (lbs)  2    ABduction  AROM;Strengthening;Right;20 reps    Shoulder ABduction  Weight (lbs)  1    Other Standing Exercises  [place] flexion green 2x10, [place] abduction red band 2x10    Other Standing Exercises  R Bicep eccentric 2 lbs. 2x10      Shoulder Exercises: ROM/Strengthening   Nustep  L5 x 5 min UE/LE      Moist Heat Therapy   Number Minutes Moist Heat  10 Minutes    Moist Heat Location  Shoulder      Manual Therapy   Joint Mobilization  AP with PROM for IR    Soft tissue mobilization  STM right bicep and posterior shoulder    Passive ROM  right shoulder 4-way with focus              PT Education - 11/03/19 0943    Education Details  POC    Person(s) Educated  Patient    Methods  Explanation    Comprehension  Verbalized understanding       PT Short Term Goals - 10/28/19 1201      PT SHORT TERM GOAL #1   Title  pt to be I with inital HEP    Baseline  Pt is independent with her home HEP    Time  3    Period  Weeks    Status  Achieved      PT SHORT TERM GOAL #2    Title  reduce pain and inflammation in the R shoulder via RICE and HEP    Baseline  goal previously met    Time  3    Period  Weeks    Status  Achieved        PT Long Term Goals - 11/03/19 0945      PT LONG TERM GOAL #1   Title  increase R shoulder flexion/ abduction to >/= 140 degrees to maximize shoulder function work Sport and exercise psychologist and potential work related activities with </= 2/10 pain    Baseline  130    Time  4    Period  Weeks    Status  Revised      PT LONG TERM GOAL #2   Title  increaes shoulder IR/ ER to [facility] compared bil with </= 2/10 pain for donning/ doffing clothing, personal hygiene and ADLs    Baseline  IR reach to gluteals, ER reach to C7    Time  4    Period  Weeks    Status  On-going      PT LONG TERM GOAL #3   Title  pt to be able to lift and lower >/= 10# to and from an elevated shoulder and push/ pull >/= 20# with </=2/10 pain for functional strength    Baseline  Pt is progressing with strength but still unable    Time  4    Period  Weeks    Status  On-going      PT LONG TERM GOAL #4   Title  increaes QuickDash score to </= 40 to demo improvement in shoulder function    Baseline  achieved, 13.64    Time  4    Period  Weeks    Status  Achieved      PT LONG TERM GOAL #5   Title  pt to be I with all HEP given as of last visit to maintain and progress current level of function    Baseline  Ongoing pt continues to perform exercises provided.    Time  4    Period  Weeks  Status  On-going            Plan - 11/03/19 0943    Clinical Impression Statement  Continued previous focus strengthening progression also with continued manual work to address tightness limiting IR ROM in particular as well as eccentric and manual work to address right bicep pain. Fair progress with continued high pain level but gradually making gains with right shoulder strength and reaching ability.    Personal Factors and Comorbidities  Comorbidity 3+    Comorbidities  hx or  Anxiety/ depression and sickle cell trait    Examination-Activity Limitations  Lift;Reach Overhead    Examination-Participation Restrictions  Meal Prep;Laundry    Stability/Clinical Decision Making  Evolving/Moderate complexity    Clinical Decision Making  Moderate    Rehab Potential  Good    PT Frequency  2x / week    PT Duration  4 weeks    PT Treatment/Interventions  ADLs/Self Care Home Management;Electrical Stimulation;Cryotherapy;Iontophoresis 18m/ml Dexamethasone;Moist Heat;Ultrasound;Therapeutic activities;Therapeutic exercise;Neuromuscular re-education;Patient/family education;Manual techniques;Passive range of motion;Taping;Vasopneumatic Device;Dry needling    PT Next Visit Plan  Monitor pain, continue shoulder AROM and strengthening progression as tolerated, manual/PROM for tightness as needed to improve IR and flexion ,abduction ROM    PT Home Exercise Plan  Shoulder flexion in standing, abduction/scaption in standing vs. sidelying, Theraband ER/IR, row, extension, IR stretch with right arm behind back vs. sleeper stretch, cross body horiz. adduction stretch    Consulted and Agree with Plan of Care  Patient       Patient will benefit from skilled therapeutic intervention in order to improve the following deficits and impairments:  Improper body mechanics, Increased muscle spasms, Decreased strength, Pain, Impaired UE functional use, Decreased endurance, Decreased scar mobility, Decreased activity tolerance, Postural dysfunction, Decreased range of motion, Increased edema  Visit Diagnosis: H/O total shoulder replacement, right  Chronic right shoulder pain  Muscle weakness (generalized)  Localized edema     Problem List Patient Active Problem List   Diagnosis Date Noted  . Fever postop 07/01/2019  . Avascular necrosis of head of humerus ([place] 06/29/2019  . Constipation due to slow transit 12/18/2018  . Constipation   . Choledocholithiasis 12/14/2018  . RUQ abdominal pain    . Trichimoniasis 05/03/2015  . Pap smear for cervical cancer screening 05/02/2015  . Perimenopausal 05/02/2015  . Vaginal discharge 05/02/2015  . Bell's palsy 02/28/2015  . Insomnia 12/26/2013  . Cystic breast 12/26/2013  . Tobacco abuse 09/21/2013  . Chest pain 08/26/2013  . Thrombocytopenia, unspecified ([place] 08/26/2013  . Cardiomegaly 08/26/2013  . Sickle cell trait ([place] 08/26/2013  . Abnormal CT of the chest 08/26/2013  . Depression 08/26/2013  . Anemia, unspecified 08/26/2013    CBeaulah Dinning PT, DPT 11/03/19 9:48 AM  [facility] [address]Phone: [phone]  Fax:  [phone] Name: Tina SweatmanMRN: [id number] of Birth: [birth year]

## 2019-11-09 ENCOUNTER — Ambulatory Visit: Payer: Medicaid Other | Attending: Family Medicine | Admitting: Physical Therapy

## 2019-11-09 ENCOUNTER — Other Ambulatory Visit: Payer: Self-pay

## 2019-11-09 ENCOUNTER — Encounter: Payer: Self-pay | Admitting: Physical Therapy

## 2019-11-09 DIAGNOSIS — G8929 Other chronic pain: Secondary | ICD-10-CM | POA: Insufficient documentation

## 2019-11-09 DIAGNOSIS — R600 Localized edema: Secondary | ICD-10-CM | POA: Diagnosis present

## 2019-11-09 DIAGNOSIS — M6281 Muscle weakness (generalized): Secondary | ICD-10-CM | POA: Diagnosis present

## 2019-11-09 DIAGNOSIS — M25511 Pain in right shoulder: Secondary | ICD-10-CM | POA: Insufficient documentation

## 2019-11-09 DIAGNOSIS — Z96611 Presence of right artificial shoulder joint: Secondary | ICD-10-CM

## 2019-11-09 NOTE — Therapy (Signed)
[place], [place], [address] Phone: [phone]   Fax:  [phone]  Physical Therapy Treatment / Discharge Summary  Patient Details  Name: Harneet Noblett MRN: [id number] Date of Birth: [birth year] Referring Provider (PT): Marchia Bond MD   Encounter Date: 11/09/2019  PT End of Session - 11/09/19 0800    Visit Number  15    Number of Visits  21    Date for PT Re-Evaluation  12/02/19    Authorization Type  MCD    Authorization Time Period  11/03/19-11/30/19    Authorization - Visit Number  2    Authorization - Number of Visits  8    PT Start Time  0800    PT Stop Time  0841    PT Time Calculation (min)  41 min    Activity Tolerance  Patient tolerated treatment well    Behavior During Therapy  [facility] for tasks assessed/performed       Past Medical History:  Diagnosis Date  . Anxiety   . Asthma   . Avascular necrosis of head of humerus ([place]) 06/29/2019  . Bell's palsy   . Depression   . Sickle cell trait [facility])     Past Surgical History:  Procedure Laterality Date  . benign breast tumor removed     2006  . BREAST BIOPSY Right 2014   benign  . BREAST EXCISIONAL BIOPSY Right 2007   benign   . CHOLECYSTECTOMY N/A 12/16/2018   Procedure: LAPAROSCOPIC CHOLECYSTECTOMY;  Surgeon: Clovis Riley, MD;  [facility];  Service: General;  Laterality: N/A;  . right ankle surgery     2010  . TOTAL SHOULDER ARTHROPLASTY Right 06/29/2019   Procedure: TOTAL SHOULDER ARTHROPLASTY;  Surgeon: Marchia Bond, MD;  [facility];  Service: Orthopedics;  Laterality: Right;  . TUBAL LIGATION      There were no vitals filed for this visit.  Subjective Assessment - 11/09/19 0800    Subjective  "I've been doing pretty good, and been doing exericse every day and I feel I have no real limitations    Patient Stated Goals  to get more function in the R shoulder,    Currently in Pain?  No/denies    Pain  Score  0-No pain    Pain Orientation  Right    Aggravating Factors   N/A    Pain Relieving Factors  heat, medication and exercise         [facility] PT Assessment - 11/09/19 0001      Assessment   Medical Diagnosis  S/p R total shoulder    Referring Provider (PT)  Marchia Bond MD      AROM   Right Shoulder Flexion  130 Degrees    Right Shoulder ABduction  130 Degrees    Right Shoulder Internal Rotation  --   L2   Right Shoulder External Rotation  --   reach to T1     Strength   Right Shoulder Flexion  4+/5    Right Shoulder Extension  4+/5    Right Shoulder ABduction  4+/5    Right Shoulder Internal Rotation  5/5    Right Shoulder External Rotation  4+/5           Quick Dash - 11/09/19 0001    Open a tight or new jar  No difficulty    Do heavy household chores (wash walls, wash floors)  No difficulty    Carry a  shopping bag or briefcase  Moderate difficulty    Wash your back  Mild difficulty    Use a knife to cut food  No difficulty    Recreational activities in which you take some force or impact through your arm, shoulder, or hand (golf, hammering, tennis)  Mild difficulty    During the past week, to what extent has your arm, shoulder or hand problem interfered with your normal social activities with family, friends, neighbors, or groups?  Not at all    During the past week, to what extent has your arm, shoulder or hand problem limited your work or other regular daily activities  Not at all    Arm, shoulder, or hand pain.  Moderate    Tingling (pins and needles) in your arm, shoulder, or hand  None    Difficulty Sleeping  Severe difficulty    DASH Score  20.45 %             [facility] Adult PT Treatment/Exercise - 11/09/19 0001      Shoulder Exercises: Standing   External Rotation  AROM;Strengthening;Right;20 reps    Theraband Level (Shoulder External Rotation)  Level 3 (Green)    Internal Rotation  AROM;Right;20 reps    Theraband Level (Shoulder Internal  Rotation)  Level 3 (Green)    Flexion  AROM;Strengthening;Right;20 reps    Shoulder Flexion Weight (lbs)  2    ABduction  AROM;Strengthening;Right;20 reps    Shoulder ABduction Weight (lbs)  1      Shoulder Exercises: ROM/Strengthening   Nustep  L6 x 5 min UE/LE      Shoulder Exercises: Stretch   Other Shoulder Stretches  3-way bicep stretch against the wall    Other Shoulder Stretches  sleeper stretch against the wall 2 x 30 sec             PT Education - 11/09/19 0840    Education Details  reviewed HEP and provided additional resistive bands. Discussed appropriate progression of HEP challenge strengthening appropriately.    Person(s) Educated  Patient    Methods  Explanation;Verbal cues;Handout    Comprehension  Verbalized understanding;Verbal cues required       PT Short Term Goals - 10/28/19 1201      PT SHORT TERM GOAL #1   Title  pt to be I with inital HEP    Baseline  Pt is independent with her home HEP    Time  3    Period  Weeks    Status  Achieved      PT SHORT TERM GOAL #2   Title  reduce pain and inflammation in the R shoulder via RICE and HEP    Baseline  goal previously met    Time  3    Period  Weeks    Status  Achieved        PT Long Term Goals - 11/09/19 9024      PT LONG TERM GOAL #1   Title  increase R shoulder flexion/ abduction to >/= 140 degrees to maximize shoulder function work Sport and exercise psychologist and potential work related activities with </= 2/10 pain    Baseline  130    Period  Weeks    Status  Partially Met      PT LONG TERM GOAL #2   Title  increaes shoulder IR/ ER to [facility] compared bil with </= 2/10 pain for donning/ doffing clothing, personal hygiene and ADLs    Period  Weeks    Status  Achieved      PT LONG TERM GOAL #3   Title  pt to be able to lift and lower >/= 10# to and from an elevated shoulder and push/ pull >/= 20# with </=2/10 pain for functional strength    Status  Achieved      PT LONG TERM GOAL #4   Title  increaes QuickDash  score to </= 40 to demo improvement in shoulder function    Status  Achieved      PT LONG TERM GOAL #5   Title  pt to be I with all HEP given as of last visit to maintain and progress current level of function    Period  Weeks    Status  Achieved            Plan - 11/09/19 0841    Clinical Impression Statement  pt arrived today reporting no pain or issues/ limitations noting consistency with her HEP. She is maintain her ROM and does have some mild limitations in strength secondary to fatigue. She met or partially met all goals today and was able to do all exercies reporting mild soreness that resolved with rest. based on pt's current level of function and ROM, strength and goals she is able to maintain her current level of function independently and will be discharged from PT today.    PT Treatment/Interventions  ADLs/Self Care Home Management;Electrical Stimulation;Cryotherapy;Iontophoresis 83m/ml Dexamethasone;Moist Heat;Ultrasound;Therapeutic activities;Therapeutic exercise;Neuromuscular re-education;Patient/family education;Manual techniques;Passive range of motion;Taping;Vasopneumatic Device;Dry needling    PT Next Visit Plan  D/C today    Consulted and Agree with Plan of Care  Patient       Patient will benefit from skilled therapeutic intervention in order to improve the following deficits and impairments:  Improper body mechanics, Increased muscle spasms, Decreased strength, Pain, Impaired UE functional use, Decreased endurance, Decreased scar mobility, Decreased activity tolerance, Postural dysfunction, Decreased range of motion, Increased edema  Visit Diagnosis: H/O total shoulder replacement, right  Chronic right shoulder pain  Muscle weakness (generalized)  Localized edema     Problem List Patient Active Problem List   Diagnosis Date Noted  . Fever postop 07/01/2019  . [place] humerus ([place] 06/29/2019  . Constipation due to slow transit  12/18/2018  . Constipation   . Choledocholithiasis 12/14/2018  . RUQ abdominal pain   . Trichimoniasis 05/03/2015  . Pap smear for cervical cancer screening 05/02/2015  . Perimenopausal 05/02/2015  . Vaginal discharge 05/02/2015  . Bell's palsy 02/28/2015  . Insomnia 12/26/2013  . Cystic breast 12/26/2013  . Tobacco abuse 09/21/2013  . Chest pain 08/26/2013  . Thrombocytopenia, unspecified ([place] 08/26/2013  . Cardiomegaly 08/26/2013  . Sickle cell trait ([place] 08/26/2013  . Abnormal CT of the chest 08/26/2013  . Depression 08/26/2013  . Anemia, unspecified 08/26/2013    LStarr Lake12/12/2018, 8:46 AM  [facility] [address]Phone: [phone]  Fax:  [phone] Name: TKristyna BradstreetMRN: [id number] of Birth: [birth year]     PHYSICAL THERAPY DISCHARGE SUMMARY  Visits from [facility]: 15  Current functional level related to goals / functional outcomes: See goals, quick dash 20.45% limited   Remaining deficits: Intermittent stiffness/ tightness worse with cold weather. Limited endurance with overhead reaching.    Education / Equipment: HEP, theraband, lifting mechanics, pain education  Plan: Patient agrees to discharge.  Patient goals were partially met. Patient is being discharged due to  being pleased with the current functional level.  ?????          Naileah Karg PT, DPT, LAT, ATC  11/09/19  8:47 AM

## 2019-11-11 ENCOUNTER — Ambulatory Visit: Payer: Medicaid Other | Admitting: Physical Therapy

## 2019-11-16 ENCOUNTER — Encounter: Payer: Medicaid Other | Admitting: Physical Therapy

## 2019-11-18 ENCOUNTER — Encounter: Payer: Medicaid Other | Admitting: Physical Therapy

## 2019-11-23 ENCOUNTER — Encounter: Payer: Medicaid Other | Admitting: Physical Therapy

## 2019-11-25 ENCOUNTER — Encounter: Payer: Medicaid Other | Admitting: Physical Therapy

## 2019-12-22 ENCOUNTER — Other Ambulatory Visit: Payer: Self-pay | Admitting: Internal Medicine

## 2019-12-22 ENCOUNTER — Telehealth (HOSPITAL_COMMUNITY): Payer: Self-pay

## 2019-12-22 DIAGNOSIS — Z1231 Encounter for screening mammogram for malignant neoplasm of breast: Secondary | ICD-10-CM

## 2019-12-22 NOTE — Telephone Encounter (Signed)
The above patient or their representative was contacted and gave the following answers to these questions:         Do you have any of the following symptoms?    NO  Fever                    Cough                   Shortness of breath  Do  you have any of the following other symptoms?    muscle pain         vomiting,        diarrhea        rash         weakness        red eye        abdominal pain         bruising          bruising or bleeding              joint pain           severe headache    Have you been in contact with someone who was or has been sick in the past 2 weeks?  NO  Yes                 Unsure                         Unable to assess   Does the person that you were in contact with have any of the following symptoms?   Cough         shortness of breath           muscle pain         vomiting,            diarrhea            rash            weakness           fever            red eye           abdominal pain           bruising  or  bleeding                joint pain                severe headache                 COMMENTS OR ACTION PLAN FOR THIS PATIENT:        ALL QUESTIONS ANSWERED/CMH

## 2019-12-23 ENCOUNTER — Encounter (HOSPITAL_COMMUNITY): Payer: Medicaid Other

## 2019-12-24 ENCOUNTER — Telehealth (HOSPITAL_COMMUNITY): Payer: Self-pay

## 2019-12-24 NOTE — Telephone Encounter (Signed)

## 2019-12-27 ENCOUNTER — Other Ambulatory Visit: Payer: Self-pay

## 2019-12-27 ENCOUNTER — Ambulatory Visit (HOSPITAL_COMMUNITY)
Admission: RE | Admit: 2019-12-27 | Discharge: 2019-12-27 | Disposition: A | Discharge status: Home / Self Care | Payer: Medicaid Other | Source: Ambulatory Visit | Attending: Surgery | Admitting: Surgery

## 2019-12-27 ENCOUNTER — Other Ambulatory Visit (HOSPITAL_COMMUNITY): Payer: Self-pay | Admitting: Internal Medicine

## 2019-12-27 DIAGNOSIS — R58 Hemorrhage, not elsewhere classified: Secondary | ICD-10-CM | POA: Diagnosis not present

## 2019-12-27 DIAGNOSIS — T148XXA Other injury of unspecified body region, initial encounter: Secondary | ICD-10-CM

## 2020-02-01 ENCOUNTER — Ambulatory Visit
Admission: RE | Admit: 2020-02-01 | Discharge: 2020-02-01 | Disposition: A | Discharge status: Home / Self Care | Payer: Medicaid Other | Source: Ambulatory Visit | Attending: Internal Medicine | Admitting: Internal Medicine

## 2020-02-01 ENCOUNTER — Other Ambulatory Visit: Payer: Self-pay

## 2020-02-01 DIAGNOSIS — Z1231 Encounter for screening mammogram for malignant neoplasm of breast: Secondary | ICD-10-CM

## 2020-02-02 IMAGING — DX DG CHEST 2V
2 series · 2 of 2 positions shown · non-contrast
Comparison: 03/18/2016 chest radiograph.

CLINICAL DATA: Chest pain

EXAM:
CHEST - 2 VIEW

[chest pa]
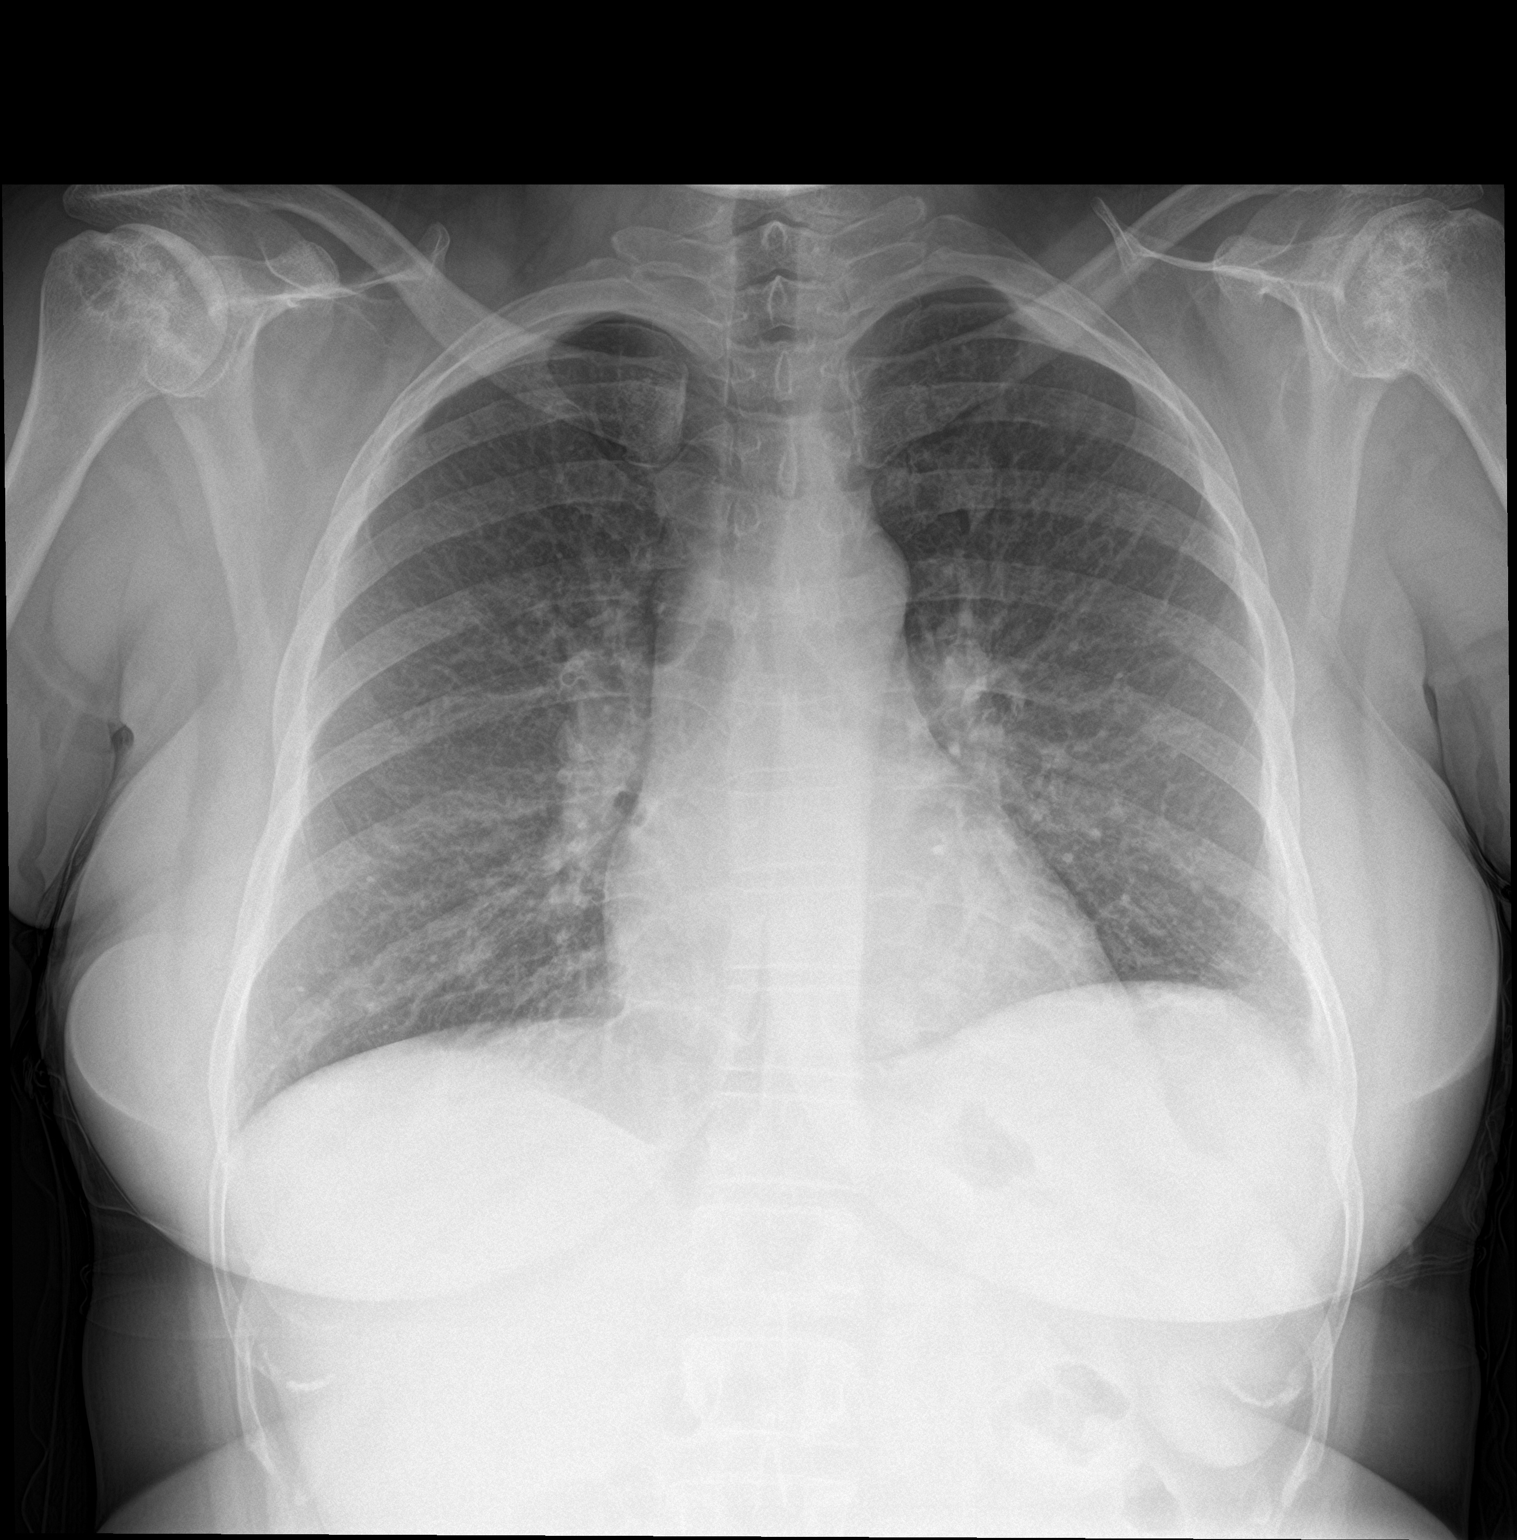

[chest lat]
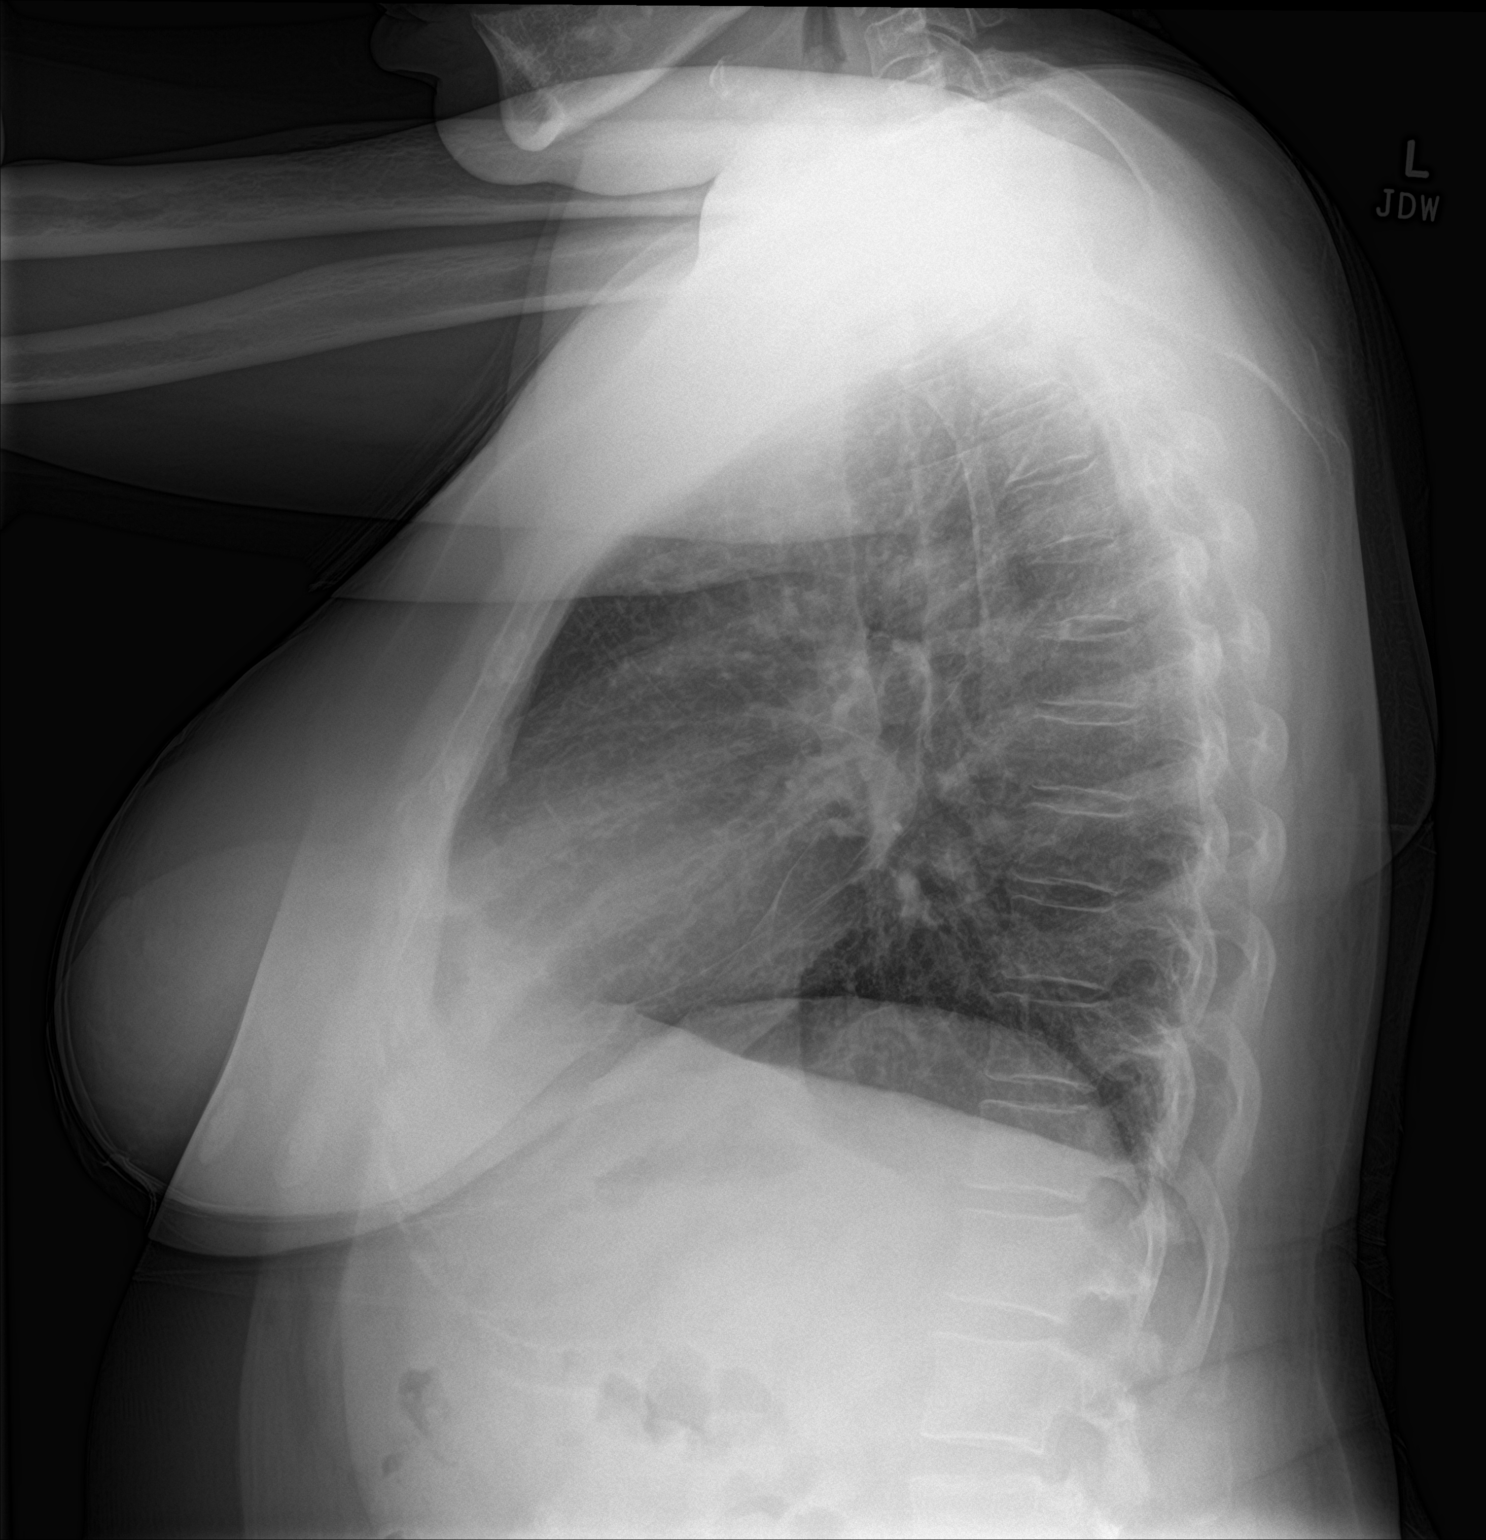

[2 of 2 positions shown; findings below may reference images not displayed]

FINDINGS: Stable cardiomediastinal silhouette with normal heart size. No
pneumothorax. No pleural effusion. Lungs appear clear, with no acute
consolidative airspace disease and no pulmonary edema.
IMPRESSION: No active cardiopulmonary disease.

## 2020-02-14 ENCOUNTER — Telehealth (HOSPITAL_COMMUNITY): Payer: Self-pay

## 2020-02-14 NOTE — Telephone Encounter (Signed)

## 2020-02-15 ENCOUNTER — Inpatient Hospital Stay (HOSPITAL_COMMUNITY): Admission: RE | Admit: 2020-02-15 | Payer: Medicaid Other | Source: Ambulatory Visit

## 2020-02-15 ENCOUNTER — Other Ambulatory Visit: Payer: Self-pay

## 2020-02-15 DIAGNOSIS — I83893 Varicose veins of bilateral lower extremities with other complications: Secondary | ICD-10-CM

## 2020-03-08 ENCOUNTER — Ambulatory Visit (HOSPITAL_COMMUNITY)
Admission: RE | Admit: 2020-03-08 | Discharge: 2020-03-08 | Disposition: A | Discharge status: Home / Self Care | Payer: Medicaid Other | Source: Ambulatory Visit | Attending: Vascular Surgery | Admitting: Vascular Surgery

## 2020-03-08 DIAGNOSIS — I83893 Varicose veins of bilateral lower extremities with other complications: Secondary | ICD-10-CM

## 2020-03-29 ENCOUNTER — Ambulatory Visit
Admission: EM | Admit: 2020-03-29 | Discharge: 2020-03-29 | Disposition: A | Discharge status: Home / Self Care | Payer: Medicaid Other | Attending: Physician Assistant | Admitting: Physician Assistant

## 2020-03-29 DIAGNOSIS — T148XXA Other injury of unspecified body region, initial encounter: Secondary | ICD-10-CM

## 2020-03-29 MED ORDER — ACETAMINOPHEN 500 MG PO TABS: 1000.0000 mg | ORAL_TABLET | Freq: Three times a day (TID) | ORAL | 0 refills | Status: DC | PRN

## 2020-03-29 MED ORDER — IBUPROFEN 800 MG PO TABS: 800.0000 mg | ORAL_TABLET | Freq: Three times a day (TID) | ORAL | 0 refills | Status: DC

## 2020-03-29 NOTE — Discharge Instructions (Signed)
Start ibuprofen as directed. Tylenol if needed for further pain relief. Warm bathtub soaks 15 mins at a time, 2-3 times a day. Follow up with PCP for further evaluation if symptoms not improving.

## 2020-03-29 NOTE — ED Triage Notes (Signed)
Pt c/o fell 2wks ago on her buttocks landing on a crate. States a large knot with bruising to whole rt buttocks area. States now the knock and bruising and went down its now painful.

## 2020-03-29 NOTE — ED Provider Notes (Signed)
[name]    CSN: [id number] Arrival date & time: 03/29/20  0835      History   Chief Complaint Chief Complaint  Patient presents with  . Fall    HPI Tina Mayo is a 49 y.o. female.   49 year old female comes in for right buttock pain after fall. States she slipped and fell onto a crate, landing on her right buttock. She had significant swelling diffusely of the right buttock with contusion. She has been alternating ice and heat compress, taking ibuprofen QD PRN since fall. States swelling has improved, but now contusion is hard, and painful to touch. Due to the pain and location, having trouble sitting.      Past Medical History:  Diagnosis Date  . Anxiety   . Asthma   . Avascular necrosis of head of humerus ([place]) 06/29/2019  . Bell's palsy   . Depression   . Sickle cell trait [facility])     Patient Active Problem List   Diagnosis Date Noted  . Fever postop 07/01/2019  . Avascular necrosis of head of humerus ([place]) 06/29/2019  . Constipation due to slow transit 12/18/2018  . Constipation   . Choledocholithiasis 12/14/2018  . RUQ abdominal pain   . Trichimoniasis 05/03/2015  . Pap smear for cervical cancer screening 05/02/2015  . Perimenopausal 05/02/2015  . Vaginal discharge 05/02/2015  . Bell's palsy 02/28/2015  . Insomnia 12/26/2013  . Cystic breast 12/26/2013  . Tobacco abuse 09/21/2013  . Chest pain 08/26/2013  . Thrombocytopenia, unspecified ([place]) 08/26/2013  . Cardiomegaly 08/26/2013  . Sickle cell trait ([place]) 08/26/2013  . Abnormal CT of the chest 08/26/2013  . Depression 08/26/2013  . Anemia, unspecified 08/26/2013    Past Surgical History:  Procedure Laterality Date  . benign breast tumor removed     2006  . BREAST BIOPSY Right 2014   benign  . BREAST EXCISIONAL BIOPSY Right 2007   benign   . CHOLECYSTECTOMY N/A 12/16/2018   Procedure: LAPAROSCOPIC CHOLECYSTECTOMY;  Surgeon: Clovis Riley, MD;  Location: [place];   Service: General;  Laterality: N/A;  . right ankle surgery     2010  . TOTAL SHOULDER ARTHROPLASTY Right 06/29/2019   Procedure: TOTAL SHOULDER ARTHROPLASTY;  Surgeon: Marchia Bond, MD;  Location: [facility];  Service: Orthopedics;  Laterality: Right;  . TUBAL LIGATION      OB History   No obstetric history on file.      Home Medications    Prior to Admission medications   Medication Sig Start Date End Date Taking? Authorizing Provider  acetaminophen (TYLENOL) 500 MG tablet Take 2 tablets (1,000 mg total) by mouth every 8 (eight) hours as needed. 03/29/20   Tasia Catchings, Muhammadali Ries V, PA-C  ARIPiprazole (ABILIFY) 10 MG tablet Take 10 mg by mouth at bedtime.  01/23/17   [provider]  ibuprofen (ADVIL) 800 MG tablet Take 1 tablet (800 mg total) by mouth 3 (three) times daily. 03/29/20   Tasia Catchings, Estanislado Surgeon V, PA-C  Lidocaine 4 % PTCH Place 1 patch onto the skin daily as needed (pain).    [provider]  Menthol-Methyl Salicylate (MUSCLE RUB) 10-15 % CREA Apply 1 application topically as needed for muscle pain.    [provider]  sertraline (ZOLOFT) 50 MG tablet Take 50 mg by mouth at bedtime.  01/23/17   [provider]    Family History Family History  Problem Relation Age of Onset  . Breast cancer Mother   .  Cancer Mother   . Diabetes Mellitus II Father   . Diabetes Father   . Breast cancer Maternal Grandmother     Social History Social History   Tobacco Use  . Smoking status: Current Every Day Smoker    Packs/day: 0.10    Types: Cigarettes  . Smokeless tobacco: Current User  Substance Use Topics  . Alcohol use: No  . Drug use: No     Allergies   Levaquin [levofloxacin in d5w] and Penicillins   Review of Systems Review of Systems  Reason unable to perform ROS: See HPI as above.     Physical Exam Triage Vital Signs ED Triage Vitals  Enc Vitals Group     BP 03/29/20 0844 107/66     Pulse Rate 03/29/20 0844 79     Resp 03/29/20 0844 18     Temp  03/29/20 0844 98.1 F (36.7 C)     Temp Source 03/29/20 0844 Oral     SpO2 03/29/20 0844 97 %     Weight --      Height --      Head Circumference --      Peak Flow --      Pain Score 03/29/20 0845 9     Pain Loc --      Pain Edu? --      Excl. in [place]? --    No data found.  Updated Vital Signs BP 107/66 (BP Location: Left Arm)   Pulse 79   Temp 98.1 F (36.7 C) (Oral)   Resp 18   LMP 01/12/2017   SpO2 97%   Physical Exam Constitutional:      General: She is not in acute distress.    Appearance: Normal appearance. She is well-developed. She is not toxic-appearing or diaphoretic.  HENT:     Head: Normocephalic and atraumatic.  Eyes:     Conjunctiva/sclera: Conjunctivae normal.     Pupils: Pupils are equal, round, and reactive to light.  Pulmonary:     Effort: Pulmonary effort is normal. No respiratory distress.     Comments: Speaking in full sentences without difficulty Musculoskeletal:     Cervical back: Normal range of motion and neck supple.  Skin:    General: Skin is warm and dry.     Comments: Large hard hematoma to the right buttock, approx 15cm x 6cm. Superficial contusion noted. No fluctuance, erythema, warmth. No induration  Neurological:     Mental Status: She is alert and oriented to person, place, and time.      UC Treatments / Results  Labs (all labs ordered are listed, but only abnormal results are displayed) Labs Reviewed - No data to display  EKG   Radiology No results found.  Procedures Procedures (including critical care time)  Medications Ordered in UC Medications - No data to display  Initial Impression / Assessment and Plan / UC Course  I have reviewed the triage vital signs and the nursing notes.  Pertinent labs & imaging results that were available during my care of the patient were reviewed by me and considered in my medical decision making (see chart for details).    History and exam consistent with large hematoma to the  right buttock.  No signs of superimposed cellulitis/abscess.  Will provide symptomatic treatment at this time with warm compresses/sitz bath, NSAIDs, Tylenol.  If symptoms not improving, to follow-up with PCP for further evaluation and management needed.  Return precautions given.  Patient expresses understanding and agrees  with plan.  Final Clinical Impressions(s) / UC Diagnoses   Final diagnoses:  Hematoma   ED Prescriptions    Medication Sig Dispense Auth. Provider   ibuprofen (ADVIL) 800 MG tablet Take 1 tablet (800 mg total) by mouth 3 (three) times daily. 21 tablet Alden Feagan V, PA-C   acetaminophen (TYLENOL) 500 MG tablet Take 2 tablets (1,000 mg total) by mouth every 8 (eight) hours as needed. 30 tablet Ok Edwards, PA-C     PDMP not reviewed this encounter.   Ok Edwards, PA-C 03/29/20 [phone]

## 2020-03-31 ENCOUNTER — Telehealth (HOSPITAL_COMMUNITY): Payer: Self-pay

## 2020-03-31 NOTE — Telephone Encounter (Signed)

## 2020-04-03 ENCOUNTER — Ambulatory Visit (INDEPENDENT_AMBULATORY_CARE_PROVIDER_SITE_OTHER): Payer: Medicaid Other | Admitting: Physician Assistant

## 2020-04-03 ENCOUNTER — Other Ambulatory Visit: Payer: Self-pay

## 2020-04-03 ENCOUNTER — Ambulatory Visit (HOSPITAL_COMMUNITY)
Admission: RE | Admit: 2020-04-03 | Discharge: 2020-04-03 | Disposition: A | Discharge status: Home / Self Care | Payer: Medicaid Other | Source: Ambulatory Visit | Attending: Vascular Surgery | Admitting: Vascular Surgery

## 2020-04-03 VITALS — BP 100/68 | HR 84 | Temp 96.4°F | Resp 18 | Ht 67.0 in | Wt 197.3 lb

## 2020-04-03 DIAGNOSIS — T148XXA Other injury of unspecified body region, initial encounter: Secondary | ICD-10-CM | POA: Diagnosis not present

## 2020-04-03 DIAGNOSIS — I83893 Varicose veins of bilateral lower extremities with other complications: Secondary | ICD-10-CM

## 2020-04-03 NOTE — Progress Notes (Signed)
[facility] [place]   Reason for referral: Right buttock pain  History of Present Illness  Tina Mayo is a 49 y.o. female who presents with chief complaint: right buttock pain after a fall on a milk crate.  She denise sciatic pain, lower extremity edema, non healing wounds or history of DVT or varicose veins.    Past medical history : Bells palsy, depression, sickle cell, asthma and AVN humerus.     Past Medical History:  Diagnosis Date  . Anxiety   . Asthma   . 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 Bell's palsy   . Depression   . Sickle cell trait [facility])     Past Surgical History:  Procedure Laterality Date  . benign breast tumor removed     2006  . BREAST BIOPSY Right 2014   benign  . BREAST EXCISIONAL BIOPSY Right 2007   benign   . CHOLECYSTECTOMY N/A 12/16/2018   Procedure: LAPAROSCOPIC CHOLECYSTECTOMY;  Surgeon: Clovis Riley, MD;  Location: [place];  Service: General;  Laterality: N/A;  . right ankle surgery     2010  . TOTAL SHOULDER ARTHROPLASTY Right 06/29/2019   Procedure: TOTAL SHOULDER ARTHROPLASTY;  Surgeon: Marchia Bond, MD;  [facility];  Service: Orthopedics;  Laterality: Right;  . TUBAL LIGATION      Social History   Socioeconomic History  . Marital status: Single    Spouse name: Not on file  . Number of children: Not on file  . Years of education: Not on file  . Highest education level: Not on file  Occupational History  . Not on file  Tobacco Use  . Smoking status: Current Every Day Smoker    Packs/day: 0.10    Types: Cigarettes  . Smokeless tobacco: Current User  Substance and Sexual Activity  . Alcohol use: No  . Drug use: No  . Sexual activity: Not on file  Other Topics Concern  . Not on file  Social History Narrative  . Not on file   Social Determinants of Health   Financial Resource Strain: Low Risk   . Difficulty of Paying Living Expenses: Not very hard  Food Insecurity:  Unknown  . Worried About Charity fundraiser in the Last Year: Patient refused  . Ran Out of Food in the Last Year: Patient refused  Transportation Needs: No Transportation Needs  . Lack of Transportation (Medical): No  . Lack of Transportation (Non-Medical): No  Physical Activity: Unknown  . Days of Exercise per Week: Patient refused  . Minutes of Exercise per Session: Patient refused  Stress: Stress Concern Present  . Feeling of Stress : Rather much  Social Connections: Unknown  . Frequency of Communication with Friends and Family: More than three times a week  . Frequency of Social Gatherings with Friends and Family: More than three times a week  . Attends Religious Services: Patient refused  . Active Member of Clubs or Organizations: Patient refused  . Attends Archivist Meetings: Patient refused  . Marital Status: Patient refused  Intimate Partner Violence: Not At Risk  . Fear of Current or Ex-Partner: No  . Emotionally Abused: No  . Physically Abused: No  . Sexually Abused: No    Family History  Problem Relation Age of Onset  . Breast cancer Mother   . Cancer Mother   . Diabetes Mellitus II Father   . Diabetes Father   . Breast cancer Maternal Grandmother  Current Outpatient Medications on File Prior to Visit  Medication Sig Dispense Refill  . acetaminophen (TYLENOL) 500 MG tablet Take 2 tablets (1,000 mg total) by mouth every 8 (eight) hours as needed. 30 tablet 0  . ARIPiprazole (ABILIFY) 10 MG tablet Take 10 mg by mouth at bedtime.   1  . ibuprofen (ADVIL) 800 MG tablet Take 1 tablet (800 mg total) by mouth 3 (three) times daily. 21 tablet 0  . Menthol-Methyl Salicylate (MUSCLE RUB) 10-15 % CREA Apply 1 application topically as needed for muscle pain.    Marland Kitchen sertraline (ZOLOFT) 50 MG tablet Take 50 mg by mouth at bedtime.   1  . Lidocaine 4 % PTCH Place 1 patch onto the skin daily as needed (pain).     No current facility-administered medications on file  prior to visit.    Allergies as of 04/03/2020 - Review Complete 04/03/2020  Allergen Reaction Noted  . Levaquin [levofloxacin in d5w] Anaphylaxis 08/26/2013  . Penicillins Swelling 03/14/2017     ROS:   General:  No weight loss, Fever, chills  HEENT: No recent headaches, no nasal bleeding, no visual changes, no sore throat  Neurologic: No dizziness, blackouts, seizures. No recent symptoms of stroke or mini- stroke. No recent episodes of slurred speech, or temporary blindness.  Cardiac: No recent episodes of chest pain/pressure, no shortness of breath at rest.  No shortness of breath with exertion.  Denies history of atrial fibrillation or irregular heartbeat  Vascular: No history of rest pain in feet.  No history of claudication.  No history of non-healing ulcer, No history of DVT   Pulmonary: No home oxygen, no productive cough, no hemoptysis,  No asthma or wheezing  Musculoskeletal:  [ ]  Arthritis, [ ]  Low back pain,  [ ]  Joint pain [x]  buttock edema and tenderness  Hematologic:No history of hypercoagulable state.  No history of easy bleeding.  No history of anemia  Gastrointestinal: No hematochezia or melena,  No gastroesophageal reflux, no trouble swallowing  Urinary: [ ]  chronic Kidney disease, [ ]  on HD - [ ]  MWF or [ ]  TTHS, [ ]  Burning with urination, [ ]  Frequent urination, [ ]  Difficulty urinating;   Skin: No rashes  Psychological: No history of anxiety,  No history of depression  Physical Examination  Vitals:   04/03/20 1432  BP: 100/68  Pulse: 84  Resp: 18  Temp: (!) 96.4 F (35.8 C)  TempSrc: Temporal  SpO2: 97%  Weight: 197 lb 4.8 oz (89.5 kg)  Height: 5\' 7"  (1.702 m)    Body mass index is 30.9 kg/m.  General:  Alert and oriented, no acute distress HEENT: Normal Neck: No bruit or JVD Pulmonary: Clear to auscultation bilaterally Cardiac: Regular Rate and Rhythm without murmur Abdomen: Soft, non-tender, non-distended, no mass, no scars Skin: No  rash Extremity Pulses:  2+ radial, brachial, femoral, dorsalis pedis, posterior tibial pulses bilaterally Musculoskeletal: Right sided buttock firm edema and tenderness No ecchymosis.  Neurologic: Upper and lower extremity motor 5/5 and symmetric  DATA:   Venous Reflux Times  +--------------+---------+------+-----------+------------+--------+  RIGHT     Reflux NoRefluxReflux TimeDiameter cmsComments               Yes                   +--------------+---------+------+-----------+------------+--------+  CFV            yes                   +--------------+---------+------+-----------+------------+--------+  GSV at [facility]                  0.584        +--------------+---------+------+-----------+------------+--------+  GSV prox thigh                0.319        +--------------+---------+------+-----------+------------+--------+  GSV mid thigh                 0.401        +--------------+---------+------+-----------+------------+--------+  GSV dist thigh                0.397        +--------------+---------+------+-----------+------------+--------+  GSV at knee                  0.392        +--------------+---------+------+-----------+------------+--------+  GSV prox calf                 0.319        +--------------+---------+------+-----------+------------+--------+  SSV Pop Fossa                 0.132        +--------------+---------+------+-----------+------------+--------+  SSV prox calf                 0.188        +--------------+---------+------+-----------+------------+--------+  SSV mid calf                 0.119         +--------------+---------+------+-----------+------------+--------+     +--------------+---------+------+-----------+------------+--------+  LEFT     Reflux NoRefluxReflux TimeDiameter cmsComments               Yes                   +--------------+---------+------+-----------+------------+--------+  GSV at [facility]                  0.643        +--------------+---------+------+-----------+------------+--------+  GSV prox thigh                0.502        +--------------+---------+------+-----------+------------+--------+  GSV mid thigh                 0.411        +--------------+---------+------+-----------+------------+--------+  GSV dist thigh                0.416        +--------------+---------+------+-----------+------------+--------+  GSV at knee                  0.425        +--------------+---------+------+-----------+------------+--------+  GSV prox calf                 0.347        +--------------+---------+------+-----------+------------+--------+  SSV Pop Fossa                 0.425        +--------------+---------+------+-----------+------------+--------+  SSV prox calf       yes         0.354        +--------------+---------+------+-----------+------------+--------+  SSV mid calf                 0.353        +--------------+---------+------+-----------+------------+--------+         Summary:  Right:  - No evidence of deep vein thrombosis seen in the right lower extremity,  from the common femoral  through the popliteal veins.  - No evidence of superficial venous thrombosis in the right lower  extremity.  -Deep vein reflux in the CFV.  - No superficial vein reflux.     Left:  - No  evidence of deep vein thrombosis seen in the left lower extremity,  from the common femoral through the popliteal veins.  - No evidence of superficial venous thrombosis in the left lower  extremity.  - No deep vein reflux.  - superficial vein reflux in the SSV in the proximal calf.     Assessment: Local trauma from a fall.  Right buttock gluteal contusion with likely hematoma. She denise sciatic pain, LE edema,open wounds, joint pain.  Her studies today are negative for DVT and reflux B LE.  She is not at risk of limb loss from a vascular point of view.    Plan: F/U PRN.  Due to her history of sickle cell she may benefit from an MRI of the gluteal region that is swollen.  I recommend Hot alternated with cold compresses and f/u with her PCP.   Roxy Horseman PA-C [facility] Office: [phone]  MD in clinic [place]

## 2020-08-28 ENCOUNTER — Ambulatory Visit: Payer: Medicaid Other | Admitting: Physician Assistant

## 2020-08-28 ENCOUNTER — Other Ambulatory Visit: Payer: Self-pay

## 2020-08-28 VITALS — BP 106/78 | HR 83 | Temp 98.2°F | Resp 18 | Ht 66.5 in | Wt 199.0 lb

## 2020-08-28 DIAGNOSIS — Z720 Tobacco use: Secondary | ICD-10-CM

## 2020-08-28 DIAGNOSIS — R739 Hyperglycemia, unspecified: Secondary | ICD-10-CM

## 2020-08-28 DIAGNOSIS — M79672 Pain in left foot: Secondary | ICD-10-CM

## 2020-08-28 DIAGNOSIS — M79671 Pain in right foot: Secondary | ICD-10-CM | POA: Diagnosis not present

## 2020-08-28 DIAGNOSIS — M25512 Pain in left shoulder: Secondary | ICD-10-CM

## 2020-08-28 LAB — POCT GLYCOSYLATED HEMOGLOBIN (HGB A1C): Hemoglobin A1C: 4.1 % (ref 4.0–5.6)

## 2020-08-28 MED ORDER — CYCLOBENZAPRINE HCL 10 MG PO TABS: 10.0000 mg | ORAL_TABLET | Freq: Three times a day (TID) | ORAL | 0 refills | Status: DC | PRN

## 2020-08-28 NOTE — Patient Instructions (Addendum)
For your shoulder pain, I encourage you to continue with ibuprofen, trial of the Flexeril muscle relaxers, work on sleeping positions, and follow-up with your surgeon.  For your smoking cessation, I encourage you to work on slowly reducing down the number of cigarettes that you smoke, working on the habits around it.  For your foot pain I recommend that you obtain arch support, make sure shoes are proper fitting, roll your feet on frozen water bottles at night  Please let us know if there is anything else we can do for you  Kennieth Rad, PA-C Physician Assistant [facility] [email or web address]    Plantar Fasciitis Rehab Ask your health care provider which exercises are safe for you. Do exercises exactly as told by your health care provider and adjust them as directed. It is normal to feel mild stretching, pulling, tightness, or discomfort as you do these exercises. Stop right away if you feel sudden pain or your pain gets worse. Do not begin these exercises until told by your health care provider. Stretching and range-of-motion exercises These exercises warm up your muscles and joints and improve the movement and flexibility of your foot. These exercises also help to relieve pain. Plantar fascia stretch  1. Sit with your left / right leg crossed over your opposite knee. 2. Hold your heel with one hand with that thumb near your arch. With your other hand, hold your toes and gently pull them back toward the top of your foot. You should feel a stretch on the bottom of your toes or your foot (plantar fascia) or both. 3. Hold this stretch for__________ seconds. 4. Slowly release your toes and return to the starting position. Repeat __________ times. Complete this exercise __________ times a day. Gastrocnemius stretch, standing This exercise is also called a calf (gastroc) stretch. It stretches the muscles in the back of the upper  calf. 1. Stand with your hands against a wall. 2. Extend your left / right leg behind you, and bend your front knee slightly. 3. Keeping your heels on the floor and your back knee straight, shift your weight toward the wall. Do not arch your back. You should feel a gentle stretch in your upper left / right calf. 4. Hold this position for __________ seconds. Repeat __________ times. Complete this exercise __________ times a day. Soleus stretch, standing This exercise is also called a calf (soleus) stretch. It stretches the muscles in the back of the lower calf. 1. Stand with your hands against a wall. 2. Extend your left / right leg behind you, and bend your front knee slightly. 3. Keeping your heels on the floor, bend your back knee and shift your weight slightly over your back leg. You should feel a gentle stretch deep in your lower calf. 4. Hold this position for __________ seconds. Repeat __________ times. Complete this exercise __________ times a day. Gastroc and soleus stretch, standing step This exercise stretches the muscles in the back of the lower leg. These muscles are in the upper calf (gastrocnemius) and the lower calf (soleus). 1. Stand with the ball of your left / right foot on a step. The ball of your foot is on the walking surface, right under your toes. 2. Keep your other foot firmly on the same step. 3. Hold on to the wall or a railing for balance. 4. Slowly lift your other foot, allowing your body weight to press your left / right heel down over the edge of the step. You  should feel a stretch in your left / right calf. 5. Hold this position for __________ seconds. 6. Return both feet to the step. 7. Repeat this exercise with a slight bend in your left / right knee. Repeat __________ times with your left / right knee straight and __________ times with your left / right knee bent. Complete this exercise __________ times a day. Balance exercise This exercise builds your  balance and strength control of your arch to help take pressure off your plantar fascia. Single leg stand If this exercise is too easy, you can try it with your eyes closed or while standing on a pillow. 1. Without shoes, stand near a railing or in a doorway. You may hold on to the railing or door frame as needed. 2. Stand on your left / right foot. Keep your big toe down on the floor and try to keep your arch lifted. Do not let your foot roll inward. 3. Hold this position for __________ seconds. Repeat __________ times. Complete this exercise __________ times a day. This information is not intended to replace advice given to you by your health care provider. Make sure you discuss any questions you have with your health care provider. Document Revised: 03/18/2019 Document Reviewed: 09/23/2018 Elsevier Patient Education  [place].    Shoulder Pain Many things can cause shoulder pain, including:  An injury to the shoulder.  Overuse of the shoulder.  Arthritis. The source of the pain can be:  Inflammation.  An injury to the shoulder joint.  An injury to a tendon, ligament, or bone. Follow these instructions at home: Pay attention to changes in your symptoms. Let your health care provider know about them. Follow these instructions to relieve your pain. If you have a sling:  Wear the sling as told by your health care provider. Remove it only as told by your health care provider.  Loosen the sling if your fingers tingle, become numb, or turn cold and blue.  Keep the sling clean.  If the sling is not waterproof: ? Do not let it get wet. Remove it to shower or bathe.  Move your arm as little as possible, but keep your hand moving to prevent swelling. Managing pain, stiffness, and swelling   If directed, put ice on the painful area: ? Put ice in a plastic bag. ? Place a towel between your skin and the bag. ? Leave the ice on for 20 minutes, 2-3 times per day. Stop  applying ice if it does not help with the pain.  Squeeze a soft ball or a foam pad as much as possible. This helps to keep the shoulder from swelling. It also helps to strengthen the arm. General instructions  Take over-the-counter and prescription medicines only as told by your health care provider.  Keep all follow-up visits as told by your health care provider. This is important. Contact a health care provider if:  Your pain gets worse.  Your pain is not relieved with medicines.  New pain develops in your arm, hand, or fingers. Get help right away if:  Your arm, hand, or fingers: ? Tingle. ? Become numb. ? Become swollen. ? Become painful. ? Turn white or blue. Summary  Shoulder pain can be caused by an injury, overuse, or arthritis.  Pay attention to changes in your symptoms. Let your health care provider know about them.  This condition may be treated with a sling, ice, and pain medicines.  Contact your health care provider  if the pain gets worse or new pain develops. Get help right away if your arm, hand, or fingers tingle or become numb, swollen, or painful.  Keep all follow-up visits as told by your health care provider. This is important. This information is not intended to replace advice given to you by your health care provider. Make sure you discuss any questions you have with your health care provider. Document Revised: 06/09/2018 Document Reviewed: 06/09/2018 Elsevier Patient Education  [place].

## 2020-08-29 NOTE — Progress Notes (Signed)
New Patient Office Visit  Subjective:  Patient ID: Tina Mayo, female    DOB: 12-06-1971  Age: 49 y.o. MRN: [id number]  CC:  Chief Complaint  Patient presents with  . Blood Pressure Check    HPI Tina Mayo states that she had a recent virtual visit with her primary care provider, but is requesting her blood pressure be checked and she is concerned about her blood glucose.    Reports that she has been having left shoulder pain, states that she had surgical repair to her right rotator cuff last year and has been getting injections in her left shoulder but does not feel they are offering relief any longer.  Reports that she does sleep on her left side, is scared to sleep on her right side since her surgery and does not like sleeping on her back.  Has been using ibuprofen without much relief.  States Voltaren does not offer any relief  Reports that she has been having bilateral foot pain on a daily basis, states that she works on her feet all day,has  inserts in her tennis shoes.  Reports this has been going on for the last few months, denies trauma or injury, denies radiation.  Has tried ibuprofen without much relief.  Reports that she is currently smoking approximately 5 cigarettes a day, does want to stop smoking by the end of the year.   Past Medical History:  Diagnosis Date  . Anxiety   . Asthma   . Avascular necrosis of head of humerus ([place]) 06/29/2019  . Bell's palsy   . Depression   . Sickle cell trait [facility])     Past Surgical History:  Procedure Laterality Date  . benign breast tumor removed     2006  . BREAST BIOPSY Right 2014   benign  . BREAST EXCISIONAL BIOPSY Right 2007   benign   . CHOLECYSTECTOMY N/A 12/16/2018   Procedure: LAPAROSCOPIC CHOLECYSTECTOMY;  Surgeon: Clovis Riley, MD;  Location: [place];  Service: General;  Laterality: N/A;  . right ankle surgery     2010  . TOTAL SHOULDER ARTHROPLASTY Right 06/29/2019   Procedure: TOTAL  SHOULDER ARTHROPLASTY;  Surgeon: Marchia Bond, MD;  [facility];  Service: Orthopedics;  Laterality: Right;  . TUBAL LIGATION      Family History  Problem Relation Age of Onset  . Breast cancer Mother   . Cancer Mother   . Diabetes Mellitus II Father   . Diabetes Father   . Breast cancer Maternal Grandmother     Social History   Socioeconomic History  . Marital status: Single    Spouse name: Not on file  . Number of children: Not on file  . Years of education: Not on file  . Highest education level: Not on file  Occupational History  . Not on file  Tobacco Use  . Smoking status: Current Every Day Smoker    Packs/day: 0.25    Types: Cigarettes  . Smokeless tobacco: Current User  Vaping Use  . Vaping Use: Former  Substance and Sexual Activity  . Alcohol use: No  . Drug use: No  . Sexual activity: Not Currently  Other Topics Concern  . Not on file  Social History Narrative  . Not on file   Social Determinants of Health   Financial Resource Strain:   . Difficulty of Paying Living Expenses: Not on file  Food Insecurity:   . Worried About Charity fundraiser in the  Last Year: Not on file  . Ran Out of Food in the Last Year: Not on file  Transportation Needs:   . Lack of Transportation (Medical): Not on file  . Lack of Transportation (Non-Medical): Not on file  Physical Activity:   . Days of Exercise per Week: Not on file  . Minutes of Exercise per Session: Not on file  Stress:   . Feeling of Stress : Not on file  Social Connections:   . Frequency of Communication with Friends and Family: Not on file  . Frequency of Social Gatherings with Friends and Family: Not on file  . Attends Religious Services: Not on file  . Active Member of Clubs or Organizations: Not on file  . Attends Archivist Meetings: Not on file  . Marital Status: Not on file  Intimate Partner Violence:   . Fear of Current or Ex-Partner: Not on file  . Emotionally Abused: Not on  file  . Physically Abused: Not on file  . Sexually Abused: Not on file    ROS Review of Systems  Constitutional: Negative.   HENT: Negative.   Eyes: Negative.   Respiratory: Negative.   Cardiovascular: Negative.   Gastrointestinal: Negative.   Endocrine: Negative.   Genitourinary: Negative.   Musculoskeletal: Positive for arthralgias and myalgias.  Skin: Negative.   Allergic/Immunologic: Negative.   Neurological: Negative.   Hematological: Negative.   Psychiatric/Behavioral: Negative.     Objective:   Today's Vitals: BP 106/78 (BP Location: Left Arm, Patient Position: Sitting, Cuff Size: Normal)   Pulse 83   Temp 98.2 F (36.8 C) (Oral)   Resp 18   Ht 5' 6.5" (1.689 m)   Wt 199 lb (90.3 kg)   LMP 01/12/2017   SpO2 96%   BMI 31.64 kg/m   Physical Exam Vitals and nursing note reviewed.  Constitutional:      Appearance: Normal appearance. She is obese.  HENT:     Head: Normocephalic and atraumatic.     Right Ear: External ear normal.     Left Ear: External ear normal.     Nose: Nose normal.     Mouth/Throat:     Mouth: Mucous membranes are moist.     Pharynx: Oropharynx is clear.  Eyes:     Conjunctiva/sclera: Conjunctivae normal.     Pupils: Pupils are equal, round, and reactive to light.  Cardiovascular:     Rate and Rhythm: Normal rate and regular rhythm.     Pulses: Normal pulses.          Dorsalis pedis pulses are 2+ on the right side and 2+ on the left side.     Heart sounds: Normal heart sounds.  Pulmonary:     Effort: Pulmonary effort is normal.     Breath sounds: Normal breath sounds.  Abdominal:     General: Abdomen is flat.     Palpations: Abdomen is soft.  Musculoskeletal:     Right shoulder: Normal.     Left shoulder: Bony tenderness present. No swelling. Decreased range of motion. Decreased strength.     Right upper arm: Normal.     Left upper arm: Normal.     Right elbow: Normal.     Left elbow: Normal.     Right forearm: Normal.      Left forearm: Normal.     Right wrist: Normal.     Left wrist: Normal.     Right hand: Normal.     Left hand: Normal.  Cervical back: Normal range of motion and neck supple.     Right foot: Normal range of motion.     Left foot: Normal range of motion.  Feet:     Right foot:     Skin integrity: Skin integrity normal.     Left foot:     Skin integrity: Skin integrity normal.  Skin:    General: Skin is warm and dry.  Neurological:     General: No focal deficit present.     Mental Status: She is alert and oriented to person, place, and time.  Psychiatric:        Mood and Affect: Mood normal.        Behavior: Behavior normal.        Thought Content: Thought content normal.        Judgment: Judgment normal.     Assessment & Plan:   Problem List Items Addressed This Visit      Other   Tobacco abuse    Other Visit Diagnoses    Hyperglycemia    -  Primary   Relevant Orders   HgB A1c (Completed)   Pain in joint of left shoulder       Relevant Medications   cyclobenzaprine (FLEXERIL) 10 MG tablet   Bilateral foot pain          Outpatient Encounter Medications as of 08/28/2020  Medication Sig  . acetaminophen (TYLENOL) 500 MG tablet Take 2 tablets (1,000 mg total) by mouth every 8 (eight) hours as needed.  . ARIPiprazole (ABILIFY) 10 MG tablet Take 10 mg by mouth at bedtime.   Marland Kitchen ibuprofen (ADVIL) 800 MG tablet Take 1 tablet (800 mg total) by mouth 3 (three) times daily.  . Lidocaine 4 % PTCH Place 1 patch onto the skin daily as needed (pain).  . Menthol-Methyl Salicylate (MUSCLE RUB) 10-15 % CREA Apply 1 application topically as needed for muscle pain.  Marland Kitchen sertraline (ZOLOFT) 50 MG tablet Take 50 mg by mouth at bedtime.   . cyclobenzaprine (FLEXERIL) 10 MG tablet Take 1 tablet (10 mg total) by mouth 3 (three) times daily as needed for muscle spasms.  . traZODone (DESYREL) 100 MG tablet Take 100 mg by mouth at bedtime.   No facility-administered encounter medications on  file as of 08/28/2020.  1. Hyperglycemia A1c within normal limits - HgB A1c  2. Pain in joint of left shoulder Encouraged prompt follow-up with orthopedics, trial Flexeril, continue ibuprofen, work on sleeping positions - cyclobenzaprine (FLEXERIL) 10 MG tablet; Take 1 tablet (10 mg total) by mouth 3 (three) times daily as needed for muscle spasms.  Dispense: 30 tablet; Refill: 0  3. Tobacco abuse Patient education given  4. Bilateral foot pain Encouraged arch supports, proper fitting shoes, patient education given   I have reviewed the patient's medical history (PMH, PSH, Social History, Family History, Medications, and allergies) , and have been updated if relevant. I spent 30 minutes reviewing chart and  face to face time with patient.      Follow-up: Return if symptoms worsen or fail to improve.   Loraine Grip Mayers, PA-C

## 2020-09-23 ENCOUNTER — Ambulatory Visit
Admission: EM | Admit: 2020-09-23 | Discharge: 2020-09-23 | Disposition: A | Discharge status: Home / Self Care | Payer: Medicaid Other | Attending: Physician Assistant | Admitting: Physician Assistant

## 2020-09-23 ENCOUNTER — Encounter: Payer: Self-pay | Admitting: Emergency Medicine

## 2020-09-23 ENCOUNTER — Other Ambulatory Visit: Payer: Self-pay

## 2020-09-23 DIAGNOSIS — M79671 Pain in right foot: Secondary | ICD-10-CM

## 2020-09-23 MED ORDER — MELOXICAM 7.5 MG PO TABS: 7.5000 mg | ORAL_TABLET | Freq: Every day | ORAL | 0 refills | Status: DC

## 2020-09-23 NOTE — ED Triage Notes (Signed)
PT reports she has a metal plate in right foot. Foot is swollen and painful by the end of work days. Foot was more swollen than usual last night, painful to bear weight this AM. No new injury

## 2020-09-23 NOTE — Discharge Instructions (Signed)
Start Mobic. Do not take ibuprofen (motrin/advil)/ naproxen (aleve) while on mobic. Ice compress, elevation, ace wrap during activity. You can use ace wrap or compression stockings at work to prevent swelling from occurring. Follow up with PCP for further evaluation if symptoms not improving.

## 2020-09-23 NOTE — ED Provider Notes (Signed)
[name]    CSN: [id number] Arrival date & time: 09/23/20  0851      History   Chief Complaint Chief Complaint  Patient presents with  . Foot Pain    HPI Tina Mayo is a 49 y.o. female.   49 year old female comes in for right foot pain. States has history of surgery to the right foot, and chronically will have pain and swelling after work days due to long hours of standing. However, pain worse the last few days with increased swelling. Denies new injury. Denies chronic wound, drainage, erythema, warmth.      Past Medical History:  Diagnosis Date  . Anxiety   . Asthma   . Avascular necrosis of head of humerus ([place]) 06/29/2019  . Bell's palsy   . Depression   . Sickle cell trait [facility])     Patient Active Problem List   Diagnosis Date Noted  . Fever postop 07/01/2019  . 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 Constipation due to slow transit 12/18/2018  . Constipation   . Choledocholithiasis 12/14/2018  . RUQ abdominal pain   . Trichimoniasis 05/03/2015  . Pap smear for cervical cancer screening 05/02/2015  . Perimenopausal 05/02/2015  . Vaginal discharge 05/02/2015  . Bell's palsy 02/28/2015  . Insomnia 12/26/2013  . Cystic breast 12/26/2013  . Tobacco abuse 09/21/2013  . Chest pain 08/26/2013  . Thrombocytopenia, unspecified ([place]) 08/26/2013  . Cardiomegaly 08/26/2013  . Sickle cell trait ([place]) 08/26/2013  . Abnormal CT of the chest 08/26/2013  . Depression 08/26/2013  . Anemia, unspecified 08/26/2013    Past Surgical History:  Procedure Laterality Date  . benign breast tumor removed     2006  . BREAST BIOPSY Right 2014   benign  . BREAST EXCISIONAL BIOPSY Right 2007   benign   . CHOLECYSTECTOMY N/A 12/16/2018   Procedure: LAPAROSCOPIC CHOLECYSTECTOMY;  Surgeon: Clovis Riley, MD;  Location: [place];  Service: General;  Laterality: N/A;  . right ankle surgery     2010  . TOTAL SHOULDER ARTHROPLASTY Right  06/29/2019   Procedure: TOTAL SHOULDER ARTHROPLASTY;  Surgeon: Marchia Bond, MD;  [facility];  Service: Orthopedics;  Laterality: Right;  . TUBAL LIGATION      OB History   No obstetric history on file.      Home Medications    Prior to Admission medications   Medication Sig Start Date End Date Taking? Authorizing Provider  ARIPiprazole (ABILIFY) 10 MG tablet Take 10 mg by mouth at bedtime.  01/23/17  Yes [provider]  sertraline (ZOLOFT) 50 MG tablet Take 50 mg by mouth at bedtime.  01/23/17  Yes [provider]  acetaminophen (TYLENOL) 500 MG tablet Take 2 tablets (1,000 mg total) by mouth every 8 (eight) hours as needed. 03/29/20   Tasia Catchings, Leia Coletti V, PA-C  cyclobenzaprine (FLEXERIL) 10 MG tablet Take 1 tablet (10 mg total) by mouth 3 (three) times daily as needed for muscle spasms. 08/28/20   Mayers, Cari S, PA-C  Lidocaine 4 % PTCH Place 1 patch onto the skin daily as needed (pain).    [provider]  meloxicam (MOBIC) 7.5 MG tablet Take 1 tablet (7.5 mg total) by mouth daily. 09/23/20   Tasia Catchings, Sajad Glander V, PA-C  Menthol-Methyl Salicylate (MUSCLE RUB) 10-15 % CREA Apply 1 application topically as needed for muscle pain.    [provider]  traZODone (DESYREL) 100 MG tablet Take 100 mg by mouth  at bedtime. 08/17/20   [provider]    Family History Family History  Problem Relation Age of Onset  . Breast cancer Mother   . Cancer Mother   . Diabetes Mellitus II Father   . Diabetes Father   . Breast cancer Maternal Grandmother     Social History Social History   Tobacco Use  . Smoking status: Current Every Day Smoker    Packs/day: 0.25    Types: Cigarettes  . Smokeless tobacco: Current User  Vaping Use  . Vaping Use: Former  Substance Use Topics  . Alcohol use: No  . Drug use: No     Allergies   Levaquin [levofloxacin in d5w], Cvs yogurt + calcium [ca phosphate-cholecalciferol], and Penicillins   Review of Systems Review of  Systems  Reason unable to perform ROS: See HPI as above.     Physical Exam Triage Vital Signs ED Triage Vitals [09/23/20 0913]  Enc Vitals Group     BP      Pulse Rate 70     Resp 16     Temp 98.3 F (36.8 C)     Temp Source Oral     SpO2 96 %     Weight      Height      Head Circumference      Peak Flow      Pain Score 10     Pain Loc      Pain Edu?      Excl. in [place]?    No data found.  Updated Vital Signs Pulse 70   Temp 98.3 F (36.8 C) (Oral)   Resp 16   LMP 01/12/2017   SpO2 96%   Physical Exam Constitutional:      General: She is not in acute distress.    Appearance: Normal appearance. She is well-developed. She is not toxic-appearing or diaphoretic.  HENT:     Head: Normocephalic and atraumatic.  Eyes:     Conjunctiva/sclera: Conjunctivae normal.     Pupils: Pupils are equal, round, and reactive to light.  Pulmonary:     Effort: Pulmonary effort is normal. No respiratory distress.  Musculoskeletal:     Cervical back: Normal range of motion and neck supple.     Comments: Mild swelling of right foot, lateral >medial without erythema, warmth. Tenderness to palpation of lateral foot. Full ROM. Strength 5/5. Sensation intact. Pedal pulse 2+  Skin:    General: Skin is warm and dry.  Neurological:     Mental Status: She is alert and oriented to person, place, and time.      UC Treatments / Results  Labs (all labs ordered are listed, but only abnormal results are displayed) Labs Reviewed - No data to display  EKG   Radiology No results found.  Procedures Procedures (including critical care time)  Medications Ordered in UC Medications - No data to display  Initial Impression / Assessment and Plan / UC Course  I have reviewed the triage vital signs and the nursing notes.  Pertinent labs & imaging results that were available during my care of the patient were reviewed by me and considered in my medical decision making (see chart for  details).    Chronic right foot pain/swelling, usually occurring after long hours of standing. Will do short course of NSAIDs. Otherwise ace wrap/compression stocking use discussed. Return precautions given.  Final Clinical Impressions(s) / UC Diagnoses   Final diagnoses:  Right foot pain    ED  Prescriptions    Medication Sig Dispense Auth. Provider   meloxicam (MOBIC) 7.5 MG tablet Take 1 tablet (7.5 mg total) by mouth daily. 10 tablet Ok Edwards, PA-C     PDMP not reviewed this encounter.   Ok Edwards, PA-C 09/23/20 [phone]

## 2020-09-25 ENCOUNTER — Telehealth: Payer: Medicaid Other | Admitting: *Deleted

## 2020-09-25 ENCOUNTER — Other Ambulatory Visit: Payer: Self-pay

## 2020-09-25 ENCOUNTER — Ambulatory Visit: Payer: Medicaid Other

## 2020-09-25 DIAGNOSIS — M79673 Pain in unspecified foot: Secondary | ICD-10-CM

## 2020-09-25 NOTE — Progress Notes (Signed)
Patient was seen in the UC on 09/23/20 and received an alternate muscle relaxer- meloxicam, and was advised to use ace wrap while working and supportive shoes.  Patient shares she only uses ace wrap once she is home and has finished working. Patient shares meloxicam did not provide relief in her history and had not helped over the past 2 days. Patient shared she will try to use ace wrap while working as well as purchasing supportive foot wear. Patient declined to speak with provider further due to "moving next week and having to find a new doctor".

## 2020-09-25 NOTE — Progress Notes (Signed)
Patient declined tele visit with provider

## 2020-09-26 ENCOUNTER — Telehealth: Payer: Self-pay

## 2020-09-26 NOTE — Telephone Encounter (Signed)
Contacted patient for virtual visit with the provider; patient unavalaible, unable to leave voicemail.

## 2021-07-27 DIAGNOSIS — M84375A Stress fracture, left foot, initial encounter for fracture: Secondary | ICD-10-CM | POA: Insufficient documentation

## 2021-09-07 DIAGNOSIS — M7732 Calcaneal spur, left foot: Secondary | ICD-10-CM | POA: Insufficient documentation

## 2022-06-25 ENCOUNTER — Ambulatory Visit
Admission: EM | Admit: 2022-06-25 | Discharge: 2022-06-25 | Disposition: A | Discharge status: Home / Self Care | Payer: Medicaid Other | Attending: Physician Assistant | Admitting: Physician Assistant

## 2022-06-25 ENCOUNTER — Encounter: Payer: Self-pay | Admitting: Emergency Medicine

## 2022-06-25 ENCOUNTER — Other Ambulatory Visit: Payer: Self-pay

## 2022-06-25 DIAGNOSIS — M79604 Pain in right leg: Secondary | ICD-10-CM | POA: Diagnosis not present

## 2022-06-25 MED ORDER — DICLOFENAC SODIUM 75 MG PO TBEC: 75.0000 mg | DELAYED_RELEASE_TABLET | Freq: Two times a day (BID) | ORAL | 0 refills | Status: DC

## 2022-06-25 MED ORDER — METHOCARBAMOL 500 MG PO TABS: 500.0000 mg | ORAL_TABLET | Freq: Four times a day (QID) | ORAL | 0 refills | Status: DC

## 2022-06-25 MED ORDER — KETOROLAC TROMETHAMINE 60 MG/2ML IM SOLN
60.0000 mg | Freq: Once | INTRAMUSCULAR | Status: AC
  Administered 2022-06-25: 60 mg via INTRAMUSCULAR

## 2022-06-25 NOTE — ED Triage Notes (Signed)
Right hip and thigh pain started yesterday.  Compares pain to a toothache pain.  Patient reports pain and throbbing in right hip.  History of the same.  [place] provider treats issue with tylenol # 3 and an injection per patient

## 2022-06-25 NOTE — ED Provider Notes (Signed)
[name]    CSN: [id number] Arrival date & time: 06/25/22  1914      History   Chief Complaint Chief Complaint  Patient presents with   Hip Pain    HPI Tina Mayo is a 51 y.o. female.   Patient complains of pain in her right thigh . patient reports she was working began having pain in her right thigh.  The history is provided by the patient. No language interpreter was used.  Hip Pain This is a new problem. The problem occurs constantly. The problem has not changed since onset.Nothing aggravates the symptoms. Nothing relieves the symptoms. She has tried nothing for the symptoms. The treatment provided no relief.    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   Constipation due to slow transit 12/18/2018   Constipation    Choledocholithiasis 12/14/2018   RUQ abdominal pain    Trichimoniasis 05/03/2015   Pap smear for cervical cancer screening 05/02/2015   Perimenopausal 05/02/2015   Vaginal discharge 05/02/2015   Bell's palsy 02/28/2015   Insomnia 12/26/2013   Cystic breast 12/26/2013   Tobacco abuse 09/21/2013   Chest pain 08/26/2013   Thrombocytopenia, unspecified ([place]) 08/26/2013   Cardiomegaly 08/26/2013   Sickle cell trait ([place]) 08/26/2013   Abnormal CT of the chest 08/26/2013   Depression 08/26/2013   Anemia, unspecified 08/26/2013    Past Surgical History:  Procedure Laterality Date   benign breast tumor removed     2006   BREAST BIOPSY Right 2014   benign   BREAST EXCISIONAL BIOPSY Right 2007   benign    CHOLECYSTECTOMY N/A 12/16/2018   Procedure: LAPAROSCOPIC CHOLECYSTECTOMY;  Surgeon: Clovis Riley, MD;  Location: [place];  Service: General;   Laterality: N/A;   right ankle surgery     2010   TOTAL SHOULDER ARTHROPLASTY Right 06/29/2019   Procedure: TOTAL SHOULDER ARTHROPLASTY;  Surgeon: Marchia Bond, MD;  [facility];  Service: Orthopedics;  Laterality: Right;   TUBAL LIGATION      OB History   No obstetric history on file.      Home Medications    Prior to Admission medications   Medication Sig Start Date End Date Taking? Authorizing Provider  ARIPiprazole (ABILIFY) 10 MG tablet Take 10 mg by mouth at bedtime.  01/23/17  Yes [provider]  diclofenac (VOLTAREN) 75 MG EC tablet Take 1 tablet (75 mg total) by mouth 2 (two) times daily. 06/25/22  Yes Caryl Ada K, PA-C  methocarbamol (ROBAXIN) 500 MG tablet Take 1 tablet (500 mg total) by mouth 4 (four) times daily. 06/25/22  Yes Caryl Ada K, PA-C  sertraline (ZOLOFT) 50 MG tablet Take 50 mg by mouth at bedtime. 01/23/17  Yes [provider]  acetaminophen (TYLENOL) 500 MG tablet Take 2 tablets (1,000 mg total) by mouth every 8 (eight) hours as needed. 03/29/20   Tasia Catchings, Amy V, PA-C  cyclobenzaprine (FLEXERIL) 10 MG tablet Take 1 tablet (10 mg total) by mouth 3 (three) times daily as needed for muscle spasms. 08/28/20   Mayers, Cari S, PA-C  Lidocaine 4 % PTCH Place 1 patch onto the skin  daily as needed (pain).    [provider]  meloxicam (MOBIC) 7.5 MG tablet Take 1 tablet (7.5 mg total) by mouth daily. 09/23/20   Tasia Catchings, Amy V, PA-C  Menthol-Methyl Salicylate (MUSCLE RUB) 10-15 % CREA Apply 1 application topically as needed for muscle pain.    [provider]  traZODone (DESYREL) 100 MG tablet Take 100 mg by mouth at bedtime. 08/17/20   [provider]    Family History Family History  Problem Relation Age of Onset   Breast cancer Mother    Cancer Mother    Diabetes Mellitus II Father    Diabetes Father    Breast cancer Maternal Grandmother     Social History Social History   Tobacco Use   Smoking status: Every Day     Packs/day: 0.25    Types: Cigarettes   Smokeless tobacco: Current  Vaping Use   Vaping Use: Former  Substance Use Topics   Alcohol use: No   Drug use: No     Allergies   Levaquin [levofloxacin in d5w], Cvs yogurt + calcium [ca phosphate-cholecalciferol], and Penicillins   Review of Systems Review of Systems  Musculoskeletal:  Positive for myalgias.  All other systems reviewed and are negative.    Physical Exam Triage Vital Signs ED Triage Vitals  Enc Vitals Group     BP 06/25/22 1936 (!) 142/89     Pulse Rate 06/25/22 1936 79     Resp 06/25/22 1936 (!) 26     Temp 06/25/22 1936 99.1 F (37.3 C)     Temp Source 06/25/22 1936 Oral     SpO2 06/25/22 1936 96 %     Weight --      Height --      Head Circumference --      Peak Flow --      Pain Score 06/25/22 1933 10     Pain Loc --      Pain Edu? --      Excl. in [place]? --    No data found.  Updated Vital Signs BP (!) 142/89 (BP Location: Left Arm)   Pulse 79   Temp 99.1 F (37.3 C) (Oral)   Resp (!) 26   LMP 01/12/2017   SpO2 96%   Visual Acuity Right Eye Distance:   Left Eye Distance:   Bilateral Distance:    Right Eye Near:   Left Eye Near:    Bilateral Near:     Physical Exam Vitals and nursing note reviewed.  Constitutional:      General: She is not in acute distress.    Appearance: She is well-developed.  HENT:     Head: Normocephalic and atraumatic.  Eyes:     Conjunctiva/sclera: Conjunctivae normal.  Cardiovascular:     Rate and Rhythm: Normal rate and regular rhythm.     Heart sounds: No murmur heard. Pulmonary:     Effort: Pulmonary effort is normal. No respiratory distress.     Breath sounds: Normal breath sounds.  Musculoskeletal:        General: Swelling and tenderness present.     Cervical back: Neck supple.  Skin:    General: Skin is warm and dry.     Capillary Refill: Capillary refill takes less than 2 seconds.  Neurological:     Mental Status: She is alert.   Psychiatric:        Mood and Affect: Mood normal.      UC Treatments / Results  Labs (  all labs ordered are listed, but only abnormal results are displayed) Labs Reviewed - No data to display  EKG   Radiology No results found.  Procedures Procedures (including critical care time)  Medications Ordered in UC Medications  ketorolac (TORADOL) injection 60 mg (60 mg Intramuscular Given 06/25/22 2010)    Initial Impression / Assessment and Plan / UC Course  I have reviewed the triage vital signs and the nursing notes.  Pertinent labs & imaging results that were available during my care of the patient were reviewed by me and considered in my medical decision making (see chart for details).    MDM:  Pt given injection or toradol.  Pt given rx for robaxin and voltaren.   Final Clinical Impressions(s) / UC Diagnoses   Final diagnoses:  Leg pain, right   Discharge Instructions   None    ED Prescriptions     Medication Sig Dispense Auth. Provider   diclofenac (VOLTAREN) 75 MG EC tablet Take 1 tablet (75 mg total) by mouth 2 (two) times daily. 14 tablet Bright Spielmann K, [place]   methocarbamol (ROBAXIN) 500 MG tablet Take 1 tablet (500 mg total) by mouth 4 (four) times daily. 20 tablet Fransico Meadow, [place]      PDMP not reviewed this encounter. An After Visit Summary was printed and given to the patient.    Fransico Meadow, [place] 06/25/22 2016

## 2022-08-06 ENCOUNTER — Encounter: Payer: Self-pay | Admitting: Emergency Medicine

## 2022-08-06 ENCOUNTER — Ambulatory Visit
Admission: EM | Admit: 2022-08-06 | Discharge: 2022-08-06 | Disposition: A | Discharge status: Home / Self Care | Payer: Medicaid Other | Attending: Physician Assistant | Admitting: Physician Assistant

## 2022-08-06 ENCOUNTER — Other Ambulatory Visit: Payer: Self-pay

## 2022-08-06 DIAGNOSIS — J329 Chronic sinusitis, unspecified: Secondary | ICD-10-CM | POA: Diagnosis not present

## 2022-08-06 DIAGNOSIS — J40 Bronchitis, not specified as acute or chronic: Secondary | ICD-10-CM

## 2022-08-06 MED ORDER — DOXYCYCLINE HYCLATE 100 MG PO CAPS: 100.0000 mg | ORAL_CAPSULE | Freq: Two times a day (BID) | ORAL | 0 refills | Status: DC

## 2022-08-06 MED ORDER — PREDNISONE 20 MG PO TABS: 40.0000 mg | ORAL_TABLET | Freq: Every day | ORAL | 0 refills | Status: AC

## 2022-08-06 NOTE — ED Triage Notes (Signed)
Pt sts cough and congestion x 2 weeks; pt sts negative covid test x 2

## 2022-08-06 NOTE — ED Provider Notes (Signed)
[name]    CSN: [id number] Arrival date & time: 08/06/22  0820      History   Chief Complaint Chief Complaint  Patient presents with   Cough    HPI Tina Mayo is a 51 y.o. female.   Patient here today for evaluation of cough and congestion she has had the last 2 weeks.  She reports significant nasal congestion and chest congestion that seems to be worsening with time.  She has not had fever.  She reports cough is typically productive.  She has taken COVID test twice with negative results.  She has tried multiple over-the-counter medications without significant relief.  The history is provided by the patient.  Cough Associated symptoms: rhinorrhea   Associated symptoms: no chills, no eye discharge and no fever   Patient here today for evaluation of cough and congestion has had the last 2 weeks.  She reports significant nasal congestion but also chest congestion with productive cough.  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   Constipation due to slow transit 12/18/2018   Constipation    Choledocholithiasis 12/14/2018   RUQ abdominal pain    Trichimoniasis 05/03/2015   Pap smear for cervical cancer screening 05/02/2015   Perimenopausal 05/02/2015   Vaginal discharge 05/02/2015   Bell's palsy 02/28/2015   Insomnia 12/26/2013   Cystic breast 12/26/2013   Tobacco abuse 09/21/2013   Chest pain 08/26/2013   Thrombocytopenia, unspecified ([place]) 08/26/2013   Cardiomegaly 08/26/2013   Sickle cell trait ([place]) 08/26/2013   Abnormal CT of the chest 08/26/2013   Depression 08/26/2013   Anemia, unspecified 08/26/2013    Past Surgical History:  Procedure Laterality Date   benign  breast tumor removed     2006   BREAST BIOPSY Right 2014   benign   BREAST EXCISIONAL BIOPSY Right 2007   benign    CHOLECYSTECTOMY N/A 12/16/2018   Procedure: LAPAROSCOPIC CHOLECYSTECTOMY;  Surgeon: Clovis Riley, MD;  Location: [facility];  Service: General;  Laterality: N/A;   right ankle surgery     2010   TOTAL SHOULDER ARTHROPLASTY Right 06/29/2019   Procedure: TOTAL SHOULDER ARTHROPLASTY;  Surgeon: Marchia Bond, MD;  Location: [facility];  Service: Orthopedics;  Laterality: Right;   TUBAL LIGATION      OB History   No obstetric history on file.      Home Medications    Prior to Admission medications   Medication Sig Start Date End Date Taking? Authorizing Provider  doxycycline (VIBRAMYCIN) 100 MG capsule Take 1 capsule (100 mg total) by mouth 2 (two) times daily. 08/06/22  Yes Francene Finders, PA-C  predniSONE (DELTASONE) 20 MG tablet Take 2 tablets (40 mg total) by mouth daily with breakfast for 5 days. 08/06/22 08/11/22 Yes Francene Finders, PA-C  acetaminophen (TYLENOL) 500 MG tablet Take 2 tablets (1,000 mg total) by mouth every 8 (eight) hours as needed. 03/29/20   Tasia Catchings, Amy V, PA-C  ARIPiprazole (ABILIFY) 10 MG tablet Take 10 mg by mouth at bedtime.  01/23/17   [provider]  cyclobenzaprine (FLEXERIL) 10 MG tablet Take 1 tablet (10 mg total) by mouth  3 (three) times daily as needed for muscle spasms. 08/28/20   Mayers, Cari S, PA-C  diclofenac (VOLTAREN) 75 MG EC tablet Take 1 tablet (75 mg total) by mouth 2 (two) times daily. 06/25/22   Fransico Meadow, PA-C  Lidocaine 4 % PTCH Place 1 patch onto the skin daily as needed (pain).    [provider]  meloxicam (MOBIC) 7.5 MG tablet Take 1 tablet (7.5 mg total) by mouth daily. 09/23/20   Tasia Catchings, Amy V, PA-C  Menthol-Methyl Salicylate (MUSCLE RUB) 10-15 % CREA Apply 1 application topically as needed for muscle pain.    [provider]  methocarbamol (ROBAXIN) 500 MG tablet Take 1 tablet (500 mg total) by mouth 4  (four) times daily. 06/25/22   Fransico Meadow, PA-C  sertraline (ZOLOFT) 50 MG tablet Take 50 mg by mouth at bedtime. 01/23/17   [provider]  traZODone (DESYREL) 100 MG tablet Take 100 mg by mouth at bedtime. 08/17/20   [provider]    Family History Family History  Problem Relation Age of Onset   Breast cancer Mother    Cancer Mother    Diabetes Mellitus II Father    Diabetes Father    Breast cancer Maternal Grandmother     Social History Social History   Tobacco Use   Smoking status: Every Day    Packs/day: 0.25    Types: Cigarettes   Smokeless tobacco: Current  Vaping Use   Vaping Use: Former  Substance Use Topics   Alcohol use: No   Drug use: No     Allergies   Levaquin [levofloxacin in d5w], Cvs yogurt + calcium [ca phosphate-cholecalciferol], and Penicillins   Review of Systems Review of Systems  Constitutional:  Negative for chills and fever.  HENT:  Positive for congestion and rhinorrhea.   Eyes:  Negative for discharge and redness.  Respiratory:  Positive for cough.   Gastrointestinal:  Negative for diarrhea, nausea and vomiting.     Physical Exam Triage Vital Signs ED Triage Vitals  Enc Vitals Group     BP 08/06/22 0915 114/82     Pulse Rate 08/06/22 0915 94     Resp 08/06/22 0915 18     Temp 08/06/22 0915 97.9 F (36.6 C)     Temp Source 08/06/22 0915 Oral     SpO2 08/06/22 0915 97 %     Weight --      Height --      Head Circumference --      Peak Flow --      Pain Score 08/06/22 0916 5     Pain Loc --      Pain Edu? --      Excl. in [place]? --    No data found.  Updated Vital Signs BP 114/82 (BP Location: Left Arm)   Pulse 94   Temp 97.9 F (36.6 C) (Oral)   Resp 18   LMP 01/12/2017   SpO2 97%     Physical Exam Vitals and nursing note reviewed.  Constitutional:      General: She is not in acute distress.    Appearance: Normal appearance. She is not ill-appearing.  HENT:     Head: Normocephalic and  atraumatic.     Nose: Congestion present.     Mouth/Throat:     Mouth: Mucous membranes are moist.     Pharynx: Oropharynx is clear. No oropharyngeal exudate or posterior oropharyngeal erythema.  Eyes:     Conjunctiva/sclera: Conjunctivae  normal.  Cardiovascular:     Rate and Rhythm: Normal rate and regular rhythm.     Heart sounds: Normal heart sounds.  Pulmonary:     Effort: Pulmonary effort is normal. No respiratory distress.     Breath sounds: Wheezing (mild diffuse) present. No rhonchi or rales.  Neurological:     Mental Status: She is alert.  Psychiatric:        Mood and Affect: Mood normal.        Behavior: Behavior normal.      UC Treatments / Results  Labs (all labs ordered are listed, but only abnormal results are displayed) Labs Reviewed - No data to display  EKG   Radiology No results found.  Procedures Procedures (including critical care time)  Medications Ordered in UC Medications - No data to display  Initial Impression / Assessment and Plan / UC Course  I have reviewed the triage vital signs and the nursing notes.  Pertinent labs & imaging results that were available during my care of the patient were reviewed by me and considered in my medical decision making (see chart for details).    X-ray deferred at this time and will treat with doxycycline and prednisone.  Recommended follow-up if no gradual improvement of symptoms, and we will order x-ray at that time.  Encouraged follow-up sooner with any further concerns or worsening symptoms.   Final Clinical Impressions(s) / UC Diagnoses   Final diagnoses:  Sinobronchitis   Discharge Instructions   None    ED Prescriptions     Medication Sig Dispense Auth. Provider   doxycycline (VIBRAMYCIN) 100 MG capsule Take 1 capsule (100 mg total) by mouth 2 (two) times daily. 20 capsule Ewell Poe F, PA-C   predniSONE (DELTASONE) 20 MG tablet Take 2 tablets (40 mg total) by mouth daily with breakfast  for 5 days. 10 tablet Francene Finders, PA-C      PDMP not reviewed this encounter.   Francene Finders, PA-C 08/06/22 1106

## 2022-08-08 ENCOUNTER — Emergency Department (HOSPITAL_COMMUNITY): Payer: Medicaid Other

## 2022-08-08 ENCOUNTER — Emergency Department (HOSPITAL_COMMUNITY)
Admission: EM | Admit: 2022-08-08 | Discharge: 2022-08-08 | Disposition: A | Discharge status: Home / Self Care | Payer: Medicaid Other | Attending: Emergency Medicine | Admitting: Emergency Medicine

## 2022-08-08 ENCOUNTER — Other Ambulatory Visit: Payer: Self-pay

## 2022-08-08 ENCOUNTER — Encounter (HOSPITAL_COMMUNITY): Payer: Self-pay | Admitting: Emergency Medicine

## 2022-08-08 DIAGNOSIS — D72829 Elevated white blood cell count, unspecified: Secondary | ICD-10-CM | POA: Diagnosis not present

## 2022-08-08 DIAGNOSIS — R0602 Shortness of breath: Secondary | ICD-10-CM | POA: Diagnosis present

## 2022-08-08 DIAGNOSIS — M79601 Pain in right arm: Secondary | ICD-10-CM

## 2022-08-08 DIAGNOSIS — J45909 Unspecified asthma, uncomplicated: Secondary | ICD-10-CM | POA: Insufficient documentation

## 2022-08-08 DIAGNOSIS — R079 Chest pain, unspecified: Secondary | ICD-10-CM

## 2022-08-08 DIAGNOSIS — D573 Sickle-cell trait: Secondary | ICD-10-CM | POA: Diagnosis not present

## 2022-08-08 LAB — HEPATIC FUNCTION PANEL
ALT: 12 U/L (ref 0–44)
AST: 14 U/L — ABNORMAL LOW (ref 15–41)
Albumin: 3.9 g/dL (ref 3.5–5.0)
Alkaline Phosphatase: 87 U/L (ref 38–126)
Bilirubin, Direct: 0.1 mg/dL (ref 0.0–0.2)
Indirect Bilirubin: 0.7 mg/dL (ref 0.3–0.9)
Total Bilirubin: 0.8 mg/dL (ref 0.3–1.2)
Total Protein: 7.3 g/dL (ref 6.5–8.1)

## 2022-08-08 LAB — RETICULOCYTES
Immature Retic Fract: 23.4 % — ABNORMAL HIGH (ref 2.3–15.9)
RBC.: 4.07 MIL/uL (ref 3.87–5.11)
Retic Count, Absolute: 106.6 10*3/uL (ref 19.0–186.0)
Retic Ct Pct: 2.6 % (ref 0.4–3.1)

## 2022-08-08 LAB — I-STAT BETA HCG BLOOD, ED (MC, WL, AP ONLY): I-stat hCG, quantitative: <5 m[IU]/mL (ref <5)

## 2022-08-08 LAB — TROPONIN I (HIGH SENSITIVITY)
Troponin I (High Sensitivity): 3 ng/L (ref <18)
Troponin I (High Sensitivity): 3 ng/L (ref <18)

## 2022-08-08 LAB — BASIC METABOLIC PANEL
Anion gap: 11 (ref 5–15)
BUN: 7 mg/dL (ref 6–20)
CO2: 22 mmol/L (ref 22–32)
Calcium: 9.6 mg/dL (ref 8.9–10.3)
Chloride: 108 mmol/L (ref 98–111)
Creatinine, Ser: 0.77 mg/dL (ref 0.44–1.00)
GFR, Estimated: >60 mL/min (ref >60)
Glucose, Bld: 160 mg/dL — ABNORMAL HIGH (ref 70–99)
Potassium: 3.7 mmol/L (ref 3.5–5.1)
Sodium: 141 mmol/L (ref 135–145)

## 2022-08-08 LAB — CBC
HCT: 34.1 % — ABNORMAL LOW (ref 36.0–46.0)
Hemoglobin: 12.5 g/dL (ref 12.0–15.0)
MCH: 28.7 pg (ref 26.0–34.0)
MCHC: 36.7 g/dL — ABNORMAL HIGH (ref 30.0–36.0)
MCV: 78.4 fL — ABNORMAL LOW (ref 80.0–100.0)
Platelets: 174 10*3/uL (ref 150–400)
RBC: 4.35 MIL/uL (ref 3.87–5.11)
RDW: 15.6 % — ABNORMAL HIGH (ref 11.5–15.5)
WBC: 22.5 10*3/uL — ABNORMAL HIGH (ref 4.0–10.5)
nRBC: 0 % (ref 0.0–0.2)

## 2022-08-08 MED ORDER — KETOROLAC TROMETHAMINE 15 MG/ML IJ SOLN
15.0000 mg | INTRAMUSCULAR | Status: AC
  Administered 2022-08-08: 15 mg via INTRAVENOUS
  Filled 2022-08-08: qty 1

## 2022-08-08 MED ORDER — SODIUM CHLORIDE 0.45 % IV SOLN: INTRAVENOUS | Status: DC

## 2022-08-08 MED ORDER — NAPROXEN 500 MG PO TABS: 500.0000 mg | ORAL_TABLET | Freq: Two times a day (BID) | ORAL | 0 refills | Status: DC

## 2022-08-08 MED ORDER — OXYCODONE HCL 5 MG PO TABS: 5.0000 mg | ORAL_TABLET | ORAL | 0 refills | Status: DC | PRN

## 2022-08-08 MED ORDER — HYDROMORPHONE HCL 1 MG/ML IJ SOLN
1.0000 mg | INTRAMUSCULAR | Status: AC
  Administered 2022-08-08: 1 mg via INTRAVENOUS
  Filled 2022-08-08: qty 1

## 2022-08-08 MED ORDER — IOHEXOL 350 MG/ML SOLN
50.0000 mL | Freq: Once | INTRAVENOUS | Status: AC | PRN
  Administered 2022-08-08: 50 mL via INTRAVENOUS

## 2022-08-08 NOTE — Discharge Instructions (Addendum)
Drink plenty of fluids.  Take acetaminophen along with the naproxen as needed for pain control.  Reserve oxycodone for pain not relieved by the combination of naproxen and acetaminophen.  Continue taking the antibiotic until you have completed, continue taking the steroid until you have completed it.  Return if symptoms or not being adequately controlled at home, or if you have any new or concerning symptoms.

## 2022-08-08 NOTE — ED Notes (Signed)
To CT

## 2022-08-08 NOTE — ED Provider Notes (Signed)
[facility] EMERGENCY DEPARTMENT Provider Note   CSN: [id number] Arrival date & time: 08/08/22  0115     History  Chief Complaint  Patient presents with   Shortness of Breath    Tina Mayo is a 51 y.o. female.  The history is provided by the patient.  Shortness of Breath She has history of sickle cell trait, asthma and comes in because of severe left-sided chest pain radiating to both arms which started yesterday.  She states that she has had sickle cell crises in the past and her arm pain feels like crises.  She had been seen in urgent care 2 days ago and started on prednisone and doxycycline for respiratory tract infection which have been present for 2 weeks.  Patient states that she is still coughing and cough is productive of brownish sputum.  She denies fever, chills, sweats.  Her chest pain is worse with cough and deep breathing and also with movement and palpation.  Her chest pain is different from anything that she has had.  She did take aspirin and a dose of oxycodone-acetaminophen without any relief.  She denies nausea or vomiting.   Home Medications Prior to Admission medications   Medication Sig Start Date End Date Taking? Authorizing Provider  acetaminophen (TYLENOL) 500 MG tablet Take 2 tablets (1,000 mg total) by mouth every 8 (eight) hours as needed. 03/29/20   Tasia Catchings, Amy V, PA-C  ARIPiprazole (ABILIFY) 10 MG tablet Take 10 mg by mouth at bedtime.  01/23/17   [provider]  cyclobenzaprine (FLEXERIL) 10 MG tablet Take 1 tablet (10 mg total) by mouth 3 (three) times daily as needed for muscle spasms. 08/28/20   Mayers, Cari S, PA-C  diclofenac (VOLTAREN) 75 MG EC tablet Take 1 tablet (75 mg total) by mouth 2 (two) times daily. 06/25/22   Fransico Meadow, PA-C  doxycycline (VIBRAMYCIN) 100 MG capsule Take 1 capsule (100 mg total) by mouth 2 (two) times daily. 08/06/22   Francene Finders, PA-C  Lidocaine 4 % PTCH Place 1 patch onto the skin  daily as needed (pain).    [provider]  meloxicam (MOBIC) 7.5 MG tablet Take 1 tablet (7.5 mg total) by mouth daily. 09/23/20   Tasia Catchings, Amy V, PA-C  Menthol-Methyl Salicylate (MUSCLE RUB) 10-15 % CREA Apply 1 application topically as needed for muscle pain.    [provider]  methocarbamol (ROBAXIN) 500 MG tablet Take 1 tablet (500 mg total) by mouth 4 (four) times daily. 06/25/22   Fransico Meadow, PA-C  predniSONE (DELTASONE) 20 MG tablet Take 2 tablets (40 mg total) by mouth daily with breakfast for 5 days. 08/06/22 08/11/22  Francene Finders, PA-C  sertraline (ZOLOFT) 50 MG tablet Take 50 mg by mouth at bedtime. 01/23/17   [provider]  traZODone (DESYREL) 100 MG tablet Take 100 mg by mouth at bedtime. 08/17/20   [provider]      Allergies    Levaquin [levofloxacin in d5w], Cvs yogurt + calcium [ca phosphate-cholecalciferol], and Penicillins    Review of Systems   Review of Systems  Respiratory:  Positive for shortness of breath.   All other systems reviewed and are negative.   Physical Exam Updated Vital Signs BP (!) 127/92 (BP Location: Right Arm)   Pulse 82   Temp (!) 97.3 F (36.3 C) (Oral)   Resp 17   LMP 01/12/2017   SpO2 99%  Physical Exam Vitals and nursing note reviewed.  51 year old female, who is very restless in bed and appears very uncomfortable, but is in no acute distress. Vital signs are significant for borderline elevated diastolic blood pressure. Oxygen saturation is 99%, which is normal. Head is normocephalic and atraumatic. PERRLA, EOMI. Oropharynx is clear. Neck is nontender and supple without adenopathy or JVD. Back is nontender and there is no CVA tenderness. Lungs are clear without rales, wheezes, or rhonchi. Chest is markedly tender in the left side of the chest without crepitus. Heart has regular rate and rhythm without murmur. Abdomen is soft, flat, nontender. Extremities have no cyanosis or edema, full range  of motion is present. Skin is warm and dry without rash. Neurologic: Mental status is normal, cranial nerves are intact, moves all extremities equally.  ED Results / Procedures / Treatments   Labs (all labs ordered are listed, but only abnormal results are displayed) Labs Reviewed  BASIC METABOLIC PANEL - Abnormal; Notable for the following components:      Result Value   Glucose, Bld 160 (*)    All other components within normal limits  CBC - Abnormal; Notable for the following components:   WBC 22.5 (*)    HCT 34.1 (*)    MCV 78.4 (*)    MCHC 36.7 (*)    RDW 15.6 (*)    All other components within normal limits  RETICULOCYTES  HEPATIC FUNCTION PANEL  I-STAT BETA HCG BLOOD, ED ([facility], [facility], [facility] ONLY)  TROPONIN I (HIGH SENSITIVITY)  TROPONIN I (HIGH SENSITIVITY)    EKG EKG Interpretation  Date/Time:  Thursday August 08 2022 01:23:43 EDT Ventricular Rate:  83 PR Interval:  154 QRS Duration: 80 QT Interval:  404 QTC Calculation: 474 R Axis:   39 Text Interpretation: Normal sinus rhythm Normal ECG When compared with ECG of 02-Jul-2019 06:29, HEART RATE has decreased Confirmed by Delora Fuel ([id number]) on 08/08/2022 5:10:59 AM  Radiology DG Chest 2 View  Result Date: 08/08/2022 CLINICAL DATA:  Chest pain and left arm pain, initial encounter EXAM: CHEST - 2 VIEW COMPARISON:  08/01/2019 FINDINGS: Cardiac shadow is within normal limits. Lungs are well aerated bilaterally. No focal infiltrate or sizable effusion is seen. Postsurgical changes are noted in the right shoulder. No acute bony abnormality noted. IMPRESSION: No active cardiopulmonary disease. Electronically Signed   By: Inez Catalina M.D.   On: 08/08/2022 02:07    Procedures Procedures  Cardiac monitor shows normal sinus rhythm, per my interpretation.  Medications Ordered in ED Medications  0.45 % sodium chloride infusion ( Intravenous New Bag/Given 08/08/22 0548)  ketorolac (TORADOL) 15 MG/ML injection 15 mg (15 mg  Intravenous Given 08/08/22 0549)  HYDROmorphone (DILAUDID) injection 1 mg (1 mg Intravenous Given 08/08/22 0603)  HYDROmorphone (DILAUDID) injection 1 mg (1 mg Intravenous Given 08/08/22 0641)  HYDROmorphone (DILAUDID) injection 1 mg (1 mg Intravenous Given 08/08/22 0727)  iohexol (OMNIPAQUE) 350 MG/ML injection 50 mL (50 mLs Intravenous Contrast Given 08/08/22 5361)    ED Course/ Medical Decision Making/ A&P                           Medical Decision Making Amount and/or Complexity of Data Reviewed Labs: ordered. Radiology: ordered.  Risk Prescription drug management.   Chest pain in the setting of respiratory tract infection.  Consider pneumonia, bronchitis, pericarditis, pleuritis, pulmonary embolism, sickle cell crisis, ACS.  I have reviewed and interpreted her ECG, and my interpretation is normal ECG.  Chest x-ray shows  no active cardiopulmonary disease.  I have independently viewed the images, and agree with radiologist interpretation.  I have reviewed and interpreted her laboratory tests and my interpretation is leukocytosis, mild microcytosis, elevated random glucose, normal initial troponin.  Of note, patient's leukocytosis is chronic for her and actually level today is less than her usual.  In the past, she has had thrombocytopenia which is not present today.  Hemoglobin is also significantly higher than her prior baseline.  Old records are reviewed, and she was seen in urgent care on 08/06/2022 at which time she was started on prednisone and doxycycline.  I see no ED visits or hospitalizations for sickle cell, and on review of her record of the [place] controlled substance reporting website, I see no narcotic prescriptions.  In light of her failure to respond to outpatient analgesics and degree of distress that she is in, I am ordering the sickle cell pain protocol for her even though she does not have sickle cell disease.  I am also ordering a CT angiogram of the chest to rule out  pulmonary embolism.  Pretest probability is high enough to warrant bypassing D-dimer.  CT angiogram shows no evidence of pulmonary embolism, no evidence of pneumonia.  I have independently viewed the images, and agree with radiologist's interpretation.  Following 3 doses of hydromorphone, patient is feels much more comfortable but states that she still has considerable pain in the right arm.  This does seem likely to be a sickle cell crisis as there is no other explanation for her bilateral arm pain.  I have discussed with the patient admission for pain control versus discharge and she wishes to be discharged.  I giving her prescriptions for naproxen and relatively small number of oxycodone tablets.  She is advised to take acetaminophen along with the naproxen, return if symptoms or not being adequately controlled at home where she has any new or concerning symptoms.  Final Clinical Impression(s) / ED Diagnoses Final diagnoses:  Nonspecific chest pain  Bilateral arm pain  Sickle cell trait ([place])    Rx / DC Orders ED Discharge Orders          Ordered    naproxen (NAPROSYN) 500 MG tablet  2 times daily        08/08/22 0744    oxyCODONE (ROXICODONE) 5 MG immediate release tablet  Every 4 hours PRN        08/08/22 6222              Delora Fuel, MD 97/98/92 [phone]

## 2022-08-08 NOTE — ED Notes (Signed)
Pt verbalized understanding of prescription and follow up.  Pt ambulated to bathroom and to lobby without assistance.  Pt has note to be out of work today and rest at home.

## 2022-08-08 NOTE — ED Triage Notes (Signed)
Pt c/o shortness of breath and chest pain that radiates to her left arm. Symptoms started earlier in the day.

## 2022-09-25 ENCOUNTER — Ambulatory Visit
Admission: EM | Admit: 2022-09-25 | Discharge: 2022-09-25 | Disposition: A | Discharge status: Home / Self Care | Payer: Medicaid Other | Attending: Physician Assistant | Admitting: Physician Assistant

## 2022-09-25 DIAGNOSIS — J069 Acute upper respiratory infection, unspecified: Secondary | ICD-10-CM | POA: Insufficient documentation

## 2022-09-25 DIAGNOSIS — Z1152 Encounter for screening for COVID-19: Secondary | ICD-10-CM | POA: Diagnosis present

## 2022-09-25 MED ORDER — PSEUDOEPH-BROMPHEN-DM 30-2-10 MG/5ML PO SYRP: 5.0000 mL | ORAL_SOLUTION | Freq: Four times a day (QID) | ORAL | 0 refills | Status: AC | PRN

## 2022-09-25 NOTE — ED Provider Notes (Signed)
[name]    CSN: [id number] Arrival date & time: 09/25/22  1210      History   Chief Complaint Chief Complaint  Patient presents with   URI    HPI Tina Mayo is a 51 y.o. female.   Patient here today for evaluation of nasal congestion and drainage she has had for 3 days.  She has not had fever.  She denies sore throat.  She has tried Robitussin with mild relief of symptoms.  She denies any nausea, vomiting or diarrhea.  The history is provided by the patient.    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   Constipation due to slow transit 12/18/2018   Constipation    Choledocholithiasis 12/14/2018   RUQ abdominal pain    Trichimoniasis 05/03/2015   Pap smear for cervical cancer screening 05/02/2015   Perimenopausal 05/02/2015   Vaginal discharge 05/02/2015   Bell's palsy 02/28/2015   Insomnia 12/26/2013   Cystic breast 12/26/2013   Tobacco abuse 09/21/2013   Chest pain 08/26/2013   Thrombocytopenia, unspecified ([place]) 08/26/2013   Cardiomegaly 08/26/2013   Sickle cell trait ([place]) 08/26/2013   Abnormal CT of the chest 08/26/2013   Depression 08/26/2013   Anemia, unspecified 08/26/2013    Past Surgical History:  Procedure Laterality Date   benign breast tumor removed     2006   BREAST BIOPSY Right 2014   benign   BREAST EXCISIONAL BIOPSY Right 2007   benign    CHOLECYSTECTOMY N/A 12/16/2018   Procedure: LAPAROSCOPIC CHOLECYSTECTOMY;  Surgeon: Clovis Riley, MD;  Location: [place];  Service: General;  Laterality: N/A;   right ankle surgery     2010   TOTAL SHOULDER ARTHROPLASTY Right 06/29/2019   Procedure: TOTAL SHOULDER ARTHROPLASTY;  Surgeon: Marchia Bond,  MD;  Location: [facility];  Service: Orthopedics;  Laterality: Right;   TUBAL LIGATION      OB History   No obstetric history on file.      Home Medications    Prior to Admission medications   Medication Sig Start Date End Date Taking? Authorizing Provider  brompheniramine-pseudoephedrine-DM 30-2-10 MG/5ML syrup Take 5 mLs by mouth 4 (four) times daily as needed for up to 7 days. 09/25/22 10/02/22 Yes Francene Finders, PA-C  acetaminophen (TYLENOL) 500 MG tablet Take 2 tablets (1,000 mg total) by mouth every 8 (eight) hours as needed. 03/29/20   Tasia Catchings, Amy V, PA-C  ARIPiprazole (ABILIFY) 10 MG tablet Take 10 mg by mouth at bedtime.  01/23/17   [provider]  doxycycline (VIBRAMYCIN) 100 MG capsule Take 1 capsule (100 mg total) by mouth 2 (two) times daily. 08/06/22   Francene Finders, PA-C  Lidocaine 4 % PTCH Place 1 patch onto the skin daily as needed (pain).    [provider]  meloxicam (MOBIC) 7.5 MG tablet Take 1 tablet (7.5 mg total) by mouth daily. 09/23/20   Tasia Catchings, Amy V, PA-C  Menthol-Methyl Salicylate (MUSCLE RUB) 10-15 % CREA Apply 1 application topically as needed for muscle pain.    [provider]  methocarbamol (ROBAXIN) 500 MG tablet Take 1 tablet (500 mg total)  by mouth 4 (four) times daily. 06/25/22   Fransico Meadow, PA-C  naproxen (NAPROSYN) 500 MG tablet Take 1 tablet (500 mg total) by mouth 2 (two) times daily. 1/60/73   Delora Fuel, MD  oxyCODONE (ROXICODONE) 5 MG immediate release tablet Take 1 tablet (5 mg total) by mouth every 4 (four) hours as needed for severe pain. 06/17/61   Delora Fuel, MD  sertraline (ZOLOFT) 50 MG tablet Take 50 mg by mouth at bedtime. 01/23/17   [provider]  traZODone (DESYREL) 100 MG tablet Take 100 mg by mouth at bedtime. 08/17/20   [provider]    Family History Family History  Problem Relation Age of Onset   Breast cancer Mother    Cancer Mother    Diabetes Mellitus II Father    Diabetes  Father    Breast cancer Maternal Grandmother     Social History Social History   Tobacco Use   Smoking status: Every Day    Packs/day: 0.25    Types: Cigarettes   Smokeless tobacco: Current  Vaping Use   Vaping Use: Former  Substance Use Topics   Alcohol use: No   Drug use: No     Allergies   Levaquin [levofloxacin in d5w], Cvs yogurt + calcium [ca phosphate-cholecalciferol], and Penicillins   Review of Systems Review of Systems  Constitutional:  Negative for chills and fever.  HENT:  Positive for congestion. Negative for ear pain and sore throat.   Eyes:  Negative for discharge and redness.  Respiratory:  Positive for cough. Negative for shortness of breath and wheezing.   Gastrointestinal:  Negative for abdominal pain, diarrhea, nausea and vomiting.     Physical Exam Triage Vital Signs ED Triage Vitals  Enc Vitals Group     BP      Pulse      Resp      Temp      Temp src      SpO2      Weight      Height      Head Circumference      Peak Flow      Pain Score      Pain Loc      Pain Edu?      Excl. in [place]?    No data found.  Updated Vital Signs BP 91/68   Pulse 98   Temp 97.9 F (36.6 C) (Oral)   Resp 18   LMP 01/12/2017   SpO2 97%   Physical Exam Vitals and nursing note reviewed.  Constitutional:      General: She is not in acute distress.    Appearance: Normal appearance. She is not ill-appearing.  HENT:     Head: Normocephalic and atraumatic.     Nose: Congestion present.     Mouth/Throat:     Mouth: Mucous membranes are moist.     Pharynx: No oropharyngeal exudate or posterior oropharyngeal erythema.  Eyes:     Conjunctiva/sclera: Conjunctivae normal.  Cardiovascular:     Rate and Rhythm: Normal rate and regular rhythm.     Heart sounds: Normal heart sounds. No murmur heard. Pulmonary:     Effort: Pulmonary effort is normal. No respiratory distress.     Breath sounds: Normal breath sounds. No wheezing, rhonchi or rales.  Skin:     General: Skin is warm and dry.  Neurological:     Mental Status: She is alert.  Psychiatric:        Mood  and Affect: Mood normal.        Thought Content: Thought content normal.      UC Treatments / Results  Labs (all labs ordered are listed, but only abnormal results are displayed) Labs Reviewed  SARS CORONAVIRUS 2 (TAT 6-24 HRS)    EKG   Radiology No results found.  Procedures Procedures (including critical care time)  Medications Ordered in UC Medications - No data to display  Initial Impression / Assessment and Plan / UC Course  I have reviewed the triage vital signs and the nursing notes.  Pertinent labs & imaging results that were available during my care of the patient were reviewed by me and considered in my medical decision making (see chart for details).    Suspect viral etiology of symptoms.  Will screen for COVID.  Recommended symptomatic treatment and cough syrup prescribed.  Encouraged follow-up with any further concerns.  Final Clinical Impressions(s) / UC Diagnoses   Final diagnoses:  Acute upper respiratory infection  Encounter for screening for COVID-19   Discharge Instructions   None    ED Prescriptions     Medication Sig Dispense Auth. Provider   brompheniramine-pseudoephedrine-DM 30-2-10 MG/5ML syrup Take 5 mLs by mouth 4 (four) times daily as needed for up to 7 days. 120 mL Francene Finders, PA-C      PDMP not reviewed this encounter.   Francene Finders, PA-C 09/25/22 [id number]

## 2022-09-25 NOTE — ED Triage Notes (Signed)
Pt presents with nasal drainage and congestion X 3 days.

## 2022-09-26 LAB — SARS CORONAVIRUS 2 (TAT 6-24 HRS): SARS Coronavirus 2: NEGATIVE

## 2022-10-28 ENCOUNTER — Ambulatory Visit (HOSPITAL_COMMUNITY)
Admission: EM | Admit: 2022-10-28 | Discharge: 2022-10-28 | Disposition: A | Discharge status: Home / Self Care | Payer: Medicaid Other | Attending: Family Medicine | Admitting: Family Medicine

## 2022-10-28 ENCOUNTER — Encounter (HOSPITAL_COMMUNITY): Payer: Self-pay

## 2022-10-28 DIAGNOSIS — F419 Anxiety disorder, unspecified: Secondary | ICD-10-CM | POA: Diagnosis not present

## 2022-10-28 DIAGNOSIS — M79604 Pain in right leg: Secondary | ICD-10-CM

## 2022-10-28 DIAGNOSIS — G8929 Other chronic pain: Secondary | ICD-10-CM

## 2022-10-28 DIAGNOSIS — F32A Depression, unspecified: Secondary | ICD-10-CM

## 2022-10-28 MED ORDER — ARIPIPRAZOLE 10 MG PO TABS: 10.0000 mg | ORAL_TABLET | Freq: Every day | ORAL | 1 refills | Status: DC

## 2022-10-28 MED ORDER — METHOCARBAMOL 500 MG PO TABS: 500.0000 mg | ORAL_TABLET | Freq: Two times a day (BID) | ORAL | 0 refills | Status: DC

## 2022-10-28 MED ORDER — SERTRALINE HCL 50 MG PO TABS: 50.0000 mg | ORAL_TABLET | Freq: Every day | ORAL | 1 refills | Status: DC

## 2022-10-28 MED ORDER — IBUPROFEN 800 MG PO TABS: 800.0000 mg | ORAL_TABLET | Freq: Three times a day (TID) | ORAL | 0 refills | Status: DC

## 2022-10-28 MED ORDER — DEXAMETHASONE SODIUM PHOSPHATE 10 MG/ML IJ SOLN
INTRAMUSCULAR | Status: AC
  Filled 2022-10-28: qty 1

## 2022-10-28 MED ORDER — KETOROLAC TROMETHAMINE 60 MG/2ML IM SOLN
60.0000 mg | Freq: Once | INTRAMUSCULAR | Status: AC
  Administered 2022-10-28: 60 mg via INTRAMUSCULAR

## 2022-10-28 MED ORDER — KETOROLAC TROMETHAMINE 60 MG/2ML IM SOLN
INTRAMUSCULAR | Status: AC
  Filled 2022-10-28: qty 2

## 2022-10-28 MED ORDER — DEXAMETHASONE SODIUM PHOSPHATE 10 MG/ML IJ SOLN
10.0000 mg | Freq: Once | INTRAMUSCULAR | Status: AC
  Administered 2022-10-28: 10 mg via INTRAMUSCULAR

## 2022-10-28 NOTE — ED Triage Notes (Addendum)
Chief Complaint: Black and blue marks will pop up on the right leg and then she will have muscle tightness with pain. This is on and off for years. Locked right knee and some marks on the left upper arm. Has seen vein & vascular but no diagnosis   Onset: Last week.  OTC medications tried: Yes- tiger balm, ace bandage, muscle relaxer balm    with little relief  Patient needing refills on her Abilify and Zoloft. States she has been unable to establish with specialty yet to get her medications.

## 2022-10-30 NOTE — ED Provider Notes (Signed)
[facility]   [id number] 10/28/22 Arrival Time: 5277  ASSESSMENT & PLAN:  1. Right leg pain   2. Chronic pain of right lower extremity   3. Anxiety   4. Depression, unspecified depression type     Meds ordered this encounter  Medications   ketorolac (TORADOL) injection 60 mg   dexamethasone (DECADRON) injection 10 mg   ibuprofen (ADVIL) 800 MG tablet    Sig: Take 1 tablet (800 mg total) by mouth 3 (three) times daily with meals.    Dispense:  21 tablet    Refill:  0   methocarbamol (ROBAXIN) 500 MG tablet    Sig: Take 1 tablet (500 mg total) by mouth 2 (two) times daily.    Dispense:  20 tablet    Refill:  0   sertraline (ZOLOFT) 50 MG tablet    Sig: Take 1 tablet (50 mg total) by mouth at bedtime.    Dispense:  30 tablet    Refill:  1   ARIPiprazole (ABILIFY) 10 MG tablet    Sig: Take 1 tablet (10 mg total) by mouth at bedtime.    Dispense:  30 tablet    Refill:  1   Anxiety/depression are stable.  Recommend:  Follow-up Information     [facility].   Why: If worsening or failing to improve as anticipated. Contact information: [address] [place] [phone]               Reviewed expectations re: course of current medical issues. Questions answered. Outlined signs and symptoms indicating need for more acute intervention. Patient verbalized understanding. After Visit Summary given.  SUBJECTIVE: History from: patient. Tina Mayo is a 51 y.o. female who reports "black and blue marks will pop up on the right leg" and then she will have muscle tightness with pain. This is on and off for years. Locked right knee and some marks on the left upper arm. Has seen vein & vascular but no diagnosis.  Current flare over past week. No trigger. OTC medications tried: Yes- tiger balm, ace bandage, muscle relaxer balm with little relief  Patient needing refills on her Abilify and  Zoloft. States she has been unable to establish with specialty yet to get her medications. Anxiety and depression are stable. No SI.   Past Surgical History:  Procedure Laterality Date   benign breast tumor removed     2006   BREAST BIOPSY Right 2014   benign   BREAST EXCISIONAL BIOPSY Right 2007   benign    CHOLECYSTECTOMY N/A 12/16/2018   Procedure: LAPAROSCOPIC CHOLECYSTECTOMY;  Surgeon: Clovis Riley, MD;  Location: [facility];  Service: General;  Laterality: N/A;   right ankle surgery     2010   TOTAL SHOULDER ARTHROPLASTY Right 06/29/2019   Procedure: TOTAL SHOULDER ARTHROPLASTY;  Surgeon: Marchia Bond, MD;  [facility];  Service: Orthopedics;  Laterality: Right;   TUBAL LIGATION        OBJECTIVE:  Vitals:   10/28/22 1316  BP: 107/62  Pulse: 89  Resp: 16  Temp: 98.5 F (36.9 C)  TempSrc: Oral  SpO2: 97%    General appearance: alert; no distress HEENT: [place]; AT Neck: supple with FROM Resp: unlabored respirations Extremities: RLE: no significant abnormalities; does have a few darker skin colored areas that look like mild bruising; non-raised and non-tender; hip/knee/ankle with FROM; without extremity sensation changes or weakness.  CV: brisk extremity capillary refill  of bilateral LE; 2+ DP pulse of bilateral LE. Skin: warm and dry; no visible rashes Neurologic: gait normal; normal sensation and strength of bilateral LE Psychological: alert and cooperative; normal mood and affect     Allergies  Allergen Reactions   Levaquin [Levofloxacin In D5w] Anaphylaxis   Cvs Yogurt + Calcium [Ca Phosphate-Cholecalciferol]    Penicillins Swelling    DID THE REACTION INVOLVE: Swelling of the face/tongue/throat, SOB, or low BP? Yes Sudden or severe rash/hives, skin peeling, or the inside of the mouth or nose? No Did it require medical treatment? Yes When did it last happen?   8 years ago    If all above answers are "NO", may proceed with cephalosporin use.     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   Bell's palsy    Depression    Sickle cell trait ([place])    Social History   Socioeconomic History   Marital status: Single    Spouse name: Not on file   Number of children: Not on file   Years of education: Not on file   Highest education level: Not on file  Occupational History   Not on file  Tobacco Use   Smoking status: Every Day    Packs/day: 0.25    Types: Cigarettes   Smokeless tobacco: Current  Vaping Use   Vaping Use: Former  Substance and Sexual Activity   Alcohol use: No   Drug use: No   Sexual activity: Not Currently  Other Topics Concern   Not on file  Social History Narrative   Not on file   Social Determinants of Health   Financial Resource Strain: Low Risk  (06/30/2019)   Overall Financial Resource Strain (CARDIA)    Difficulty of Paying Living Expenses: Not very hard  Food Insecurity: Unknown (06/30/2019)   Hunger Vital Sign    Worried About Running Out of Food in the Last Year: Patient refused    [place] in the Last Year: Patient refused  Transportation Needs: No Transportation Needs (06/30/2019)   PRAPARE - Hydrologist (Medical): No    Lack of Transportation (Non-Medical): No  Physical Activity: Unknown (06/30/2019)   Exercise Vital Sign    Days of Exercise per Week: Patient refused    Minutes of Exercise per Session: Patient refused  Stress: Stress Concern Present (06/30/2019)   [place]    Feeling of Stress : Rather much  Social Connections: Unknown (06/30/2019)   Social Connection and Isolation Panel [NHANES]    Frequency of Communication with Friends and Family: More than three times a week    Frequency of Social Gatherings with Friends and Family: More than three times a week    Attends Religious Services: Patient refused     Marine scientist or Organizations: Patient refused    Attends Archivist Meetings: Patient refused    Marital Status: Patient refused   Family History  Problem Relation Age of Onset   Breast cancer Mother    Cancer Mother    Diabetes Mellitus II Father    Diabetes Father    Breast cancer Maternal Grandmother    Past Surgical History:  Procedure Laterality Date   benign breast tumor removed     2006   BREAST BIOPSY Right 2014   benign  BREAST EXCISIONAL BIOPSY Right 2007   benign    CHOLECYSTECTOMY N/A 12/16/2018   Procedure: LAPAROSCOPIC CHOLECYSTECTOMY;  Surgeon: Clovis Riley, MD;  Location: [facility];  Service: General;  Laterality: N/A;   right ankle surgery     2010   TOTAL SHOULDER ARTHROPLASTY Right 06/29/2019   Procedure: TOTAL SHOULDER ARTHROPLASTY;  Surgeon: Marchia Bond, MD;  [facility];  Service: Orthopedics;  Laterality: Right;   TUBAL LIGATION         Vanessa Kick, MD 10/30/22 [id number]

## 2022-12-10 ENCOUNTER — Other Ambulatory Visit: Payer: Self-pay | Admitting: Internal Medicine

## 2022-12-11 LAB — LIPID PANEL
Cholesterol: 173 mg/dL (ref <200)
HDL: 35 mg/dL — ABNORMAL LOW (ref >=50)
LDL Cholesterol (Calc): 101 mg/dL (calc) — ABNORMAL HIGH
Non-HDL Cholesterol (Calc): 138 mg/dL (calc) — ABNORMAL HIGH (ref <130)
Total CHOL/HDL Ratio: 4.9 (calc) (ref <5.0)
Triglycerides: 258 mg/dL — ABNORMAL HIGH (ref <150)

## 2022-12-11 LAB — CBC
HCT: 33.4 % — ABNORMAL LOW (ref 35.0–45.0)
Hemoglobin: 11.3 g/dL — ABNORMAL LOW (ref 11.7–15.5)
MCH: 30 pg (ref 27.0–33.0)
MCHC: 33.8 g/dL (ref 32.0–36.0)
MCV: 88.6 fL (ref 80.0–100.0)
MPV: 13.3 fL — ABNORMAL HIGH (ref 7.5–12.5)
Platelets: 106 10*3/uL — ABNORMAL LOW (ref 140–400)
RBC: 3.77 10*6/uL — ABNORMAL LOW (ref 3.80–5.10)
RDW: 14.9 % (ref 11.0–15.0)
WBC: 13.1 10*3/uL — ABNORMAL HIGH (ref 3.8–10.8)

## 2022-12-11 LAB — COMPLETE METABOLIC PANEL WITH GFR
AG Ratio: 1.6 (calc) (ref 1.0–2.5)
ALT: 13 U/L (ref 6–29)
AST: 23 U/L (ref 10–35)
Albumin: 4.3 g/dL (ref 3.6–5.1)
Alkaline phosphatase (APISO): 102 U/L (ref 37–153)
BUN: 7 mg/dL (ref 7–25)
CO2: 19 mmol/L — ABNORMAL LOW (ref 20–32)
Calcium: 9.1 mg/dL (ref 8.6–10.4)
Chloride: 112 mmol/L — ABNORMAL HIGH (ref 98–110)
Creat: 0.71 mg/dL (ref 0.50–1.03)
Globulin: 2.7 g/dL (calc) (ref 1.9–3.7)
Glucose, Bld: 87 mg/dL (ref 65–99)
Potassium: 4.3 mmol/L (ref 3.5–5.3)
Sodium: 142 mmol/L (ref 135–146)
Total Bilirubin: 1 mg/dL (ref 0.2–1.2)
Total Protein: 7 g/dL (ref 6.1–8.1)
eGFR: 102 mL/min/{1.73_m2} (ref >=60)

## 2022-12-11 LAB — VITAMIN D 25 HYDROXY (VIT D DEFICIENCY, FRACTURES): Vit D, 25-Hydroxy: 28 ng/mL — ABNORMAL LOW (ref 30–100)

## 2022-12-11 LAB — TSH: TSH: 2.42 mIU/L

## 2023-01-28 ENCOUNTER — Ambulatory Visit (AMBULATORY_SURGERY_CENTER): Payer: Medicaid Other

## 2023-01-28 VITALS — Ht 67.0 in | Wt 203.0 lb

## 2023-01-28 DIAGNOSIS — Z1211 Encounter for screening for malignant neoplasm of colon: Secondary | ICD-10-CM

## 2023-01-28 MED ORDER — NA SULFATE-K SULFATE-MG SULF 17.5-3.13-1.6 GM/177ML PO SOLN: 1.0000 | Freq: Once | ORAL | 0 refills | Status: AC

## 2023-01-28 NOTE — Progress Notes (Signed)
No egg or soy allergy known to patient  No issues known to pt with past sedation with any surgeries or procedures Patient denies ever being told they had issues or difficulty with intubation  No FH of Malignant Hyperthermia Pt is not on diet pills Pt is not on  home 02  Pt is not on blood thinners  Pt denies issues with constipation  No A fib or A flutter Have any cardiac testing pending--no Pt instructed to use [email or web address] or GoodRx for a price reduction on prep   

## 2023-02-11 ENCOUNTER — Telehealth: Payer: Self-pay | Admitting: Gastroenterology

## 2023-02-11 DIAGNOSIS — Z1211 Encounter for screening for malignant neoplasm of colon: Secondary | ICD-10-CM

## 2023-02-11 MED ORDER — NA SULFATE-K SULFATE-MG SULF 17.5-3.13-1.6 GM/177ML PO SOLN: 1.0000 | Freq: Once | ORAL | 0 refills | Status: AC

## 2023-02-11 NOTE — Telephone Encounter (Signed)
PT is calling about Suprep. It needs to be sent to [facility] on [address]Please advise

## 2023-02-11 NOTE — Telephone Encounter (Signed)
Sent prep to [facility] on [address].  Attempted to reach pt but mailbox is full

## 2023-02-11 NOTE — Telephone Encounter (Signed)
Spoke with pt and she is aware that Suprep was sent to [facility] on [address]

## 2023-02-18 ENCOUNTER — Ambulatory Visit (AMBULATORY_SURGERY_CENTER): Payer: BLUE CROSS/BLUE SHIELD | Admitting: Gastroenterology

## 2023-02-18 ENCOUNTER — Encounter: Payer: Self-pay | Admitting: Gastroenterology

## 2023-02-18 ENCOUNTER — Encounter: Payer: No Typology Code available for payment source | Admitting: Gastroenterology

## 2023-02-18 VITALS — BP 109/76 | HR 76 | Temp 97.5°F | Resp 19 | Ht 67.0 in | Wt 203.0 lb

## 2023-02-18 DIAGNOSIS — D123 Benign neoplasm of transverse colon: Secondary | ICD-10-CM | POA: Diagnosis not present

## 2023-02-18 DIAGNOSIS — Z1211 Encounter for screening for malignant neoplasm of colon: Secondary | ICD-10-CM | POA: Diagnosis present

## 2023-02-18 MED ORDER — SODIUM CHLORIDE 0.9 % IV SOLN: 500.0000 mL | Freq: Once | INTRAVENOUS | Status: DC

## 2023-02-18 NOTE — Progress Notes (Signed)
GASTROENTEROLOGY PROCEDURE H&P NOTE   Primary Care Physician: Pcp, No    Reason for Procedure:  Colon Cancer screening  Plan:    Colonoscopy  Patient is appropriate for endoscopic procedure(s) in the ambulatory ([place] [facility]) setting.  The nature of the procedure, as well as the risks, benefits, and alternatives were carefully and thoroughly reviewed with the patient. Ample time for discussion and questions allowed. The patient understood, was satisfied, and agreed to proceed.     HPI: Tina Mayo is a 52 y.o. female who presents for colonoscopy for routine Colon Cancer screening.  No active GI symptoms.  No known family history of colon cancer or related malignancy.  Patient is otherwise without complaints or active issues today.  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   Bell's palsy    Depression    Sickle cell trait ([place])    Sleep apnea     Past Surgical History:  Procedure Laterality Date   benign breast tumor removed     2006   BREAST BIOPSY Right 2014   benign   BREAST EXCISIONAL BIOPSY Right 2007   benign    CHOLECYSTECTOMY N/A 12/16/2018   Procedure: LAPAROSCOPIC CHOLECYSTECTOMY;  Surgeon: Clovis Riley, MD;  Location: [facility];  Service: General;  Laterality: N/A;   right ankle surgery     2010   TOTAL SHOULDER ARTHROPLASTY Right 06/29/2019   Procedure: TOTAL SHOULDER ARTHROPLASTY;  Surgeon: Marchia Bond, MD;  [facility];  Service: Orthopedics;  Laterality: Right;   TUBAL LIGATION      Prior to Admission medications   Medication Sig Start Date End Date Taking? Authorizing Provider  ARIPiprazole (ABILIFY) 10 MG tablet Take 1 tablet (10 mg total) by mouth at bedtime. 10/28/22  Yes Vanessa Kick, MD  sertraline (ZOLOFT) 50 MG tablet Take 1 tablet (50 mg total) by mouth at bedtime. 10/28/22  Yes Vanessa Kick, MD  traZODone (DESYREL) 100 MG tablet Take 100 mg by mouth at  bedtime. 08/17/20  Yes [provider]  acetaminophen (TYLENOL) 500 MG tablet Take 2 tablets (1,000 mg total) by mouth every 8 (eight) hours as needed. Patient not taking: Reported on 01/28/2023 03/29/20   Ok Edwards, PA-C  doxycycline (VIBRAMYCIN) 100 MG capsule Take 1 capsule (100 mg total) by mouth 2 (two) times daily. Patient not taking: Reported on 01/28/2023 08/06/22   Francene Finders, PA-C  FEROSUL 325 (65 Fe) MG tablet Take 325 mg by mouth daily. 12/31/22   [provider]  ibuprofen (ADVIL) 800 MG tablet Take 1 tablet (800 mg total) by mouth 3 (three) times daily with meals. 10/28/22   Vanessa Kick, MD  Lidocaine 4 % PTCH Place 1 patch onto the skin daily as needed (pain). Patient not taking: Reported on 01/28/2023    [provider]  Menthol-Methyl Salicylate (MUSCLE RUB) 10-15 % CREA Apply 1 application topically as needed for muscle pain.    [provider]  methocarbamol (ROBAXIN) 500 MG tablet Take 1 tablet (500 mg total) by mouth 2 (two) times daily. Patient not taking: Reported on 01/28/2023 10/28/22   Vanessa Kick, MD  oxyCODONE (ROXICODONE) 5 MG immediate release tablet Take 1 tablet (5 mg total) by mouth every 4 (four) hours as needed for severe pain. Patient not taking: Reported on [id number]   Delora Fuel, MD    Current Outpatient Medications  Medication Sig  Dispense Refill   ARIPiprazole (ABILIFY) 10 MG tablet Take 1 tablet (10 mg total) by mouth at bedtime. 30 tablet 1   sertraline (ZOLOFT) 50 MG tablet Take 1 tablet (50 mg total) by mouth at bedtime. 30 tablet 1   traZODone (DESYREL) 100 MG tablet Take 100 mg by mouth at bedtime.     acetaminophen (TYLENOL) 500 MG tablet Take 2 tablets (1,000 mg total) by mouth every 8 (eight) hours as needed. (Patient not taking: Reported on 01/28/2023) 30 tablet 0   doxycycline (VIBRAMYCIN) 100 MG capsule Take 1 capsule (100 mg total) by mouth 2 (two) times daily. (Patient not taking: Reported on  01/28/2023) 20 capsule 0   FEROSUL 325 (65 Fe) MG tablet Take 325 mg by mouth daily.     ibuprofen (ADVIL) 800 MG tablet Take 1 tablet (800 mg total) by mouth 3 (three) times daily with meals. 21 tablet 0   Lidocaine 4 % PTCH Place 1 patch onto the skin daily as needed (pain). (Patient not taking: Reported on 01/28/2023)     Menthol-Methyl Salicylate (MUSCLE RUB) 10-15 % CREA Apply 1 application topically as needed for muscle pain.     methocarbamol (ROBAXIN) 500 MG tablet Take 1 tablet (500 mg total) by mouth 2 (two) times daily. (Patient not taking: Reported on 01/28/2023) 20 tablet 0   oxyCODONE (ROXICODONE) 5 MG immediate release tablet Take 1 tablet (5 mg total) by mouth every 4 (four) hours as needed for severe pain. (Patient not taking: Reported on 01/28/2023) 20 tablet 0   Current Facility-Administered Medications  Medication Dose Route Frequency Provider Last Rate Last Admin   0.9 %  sodium chloride infusion  500 mL Intravenous Once Riannah Stagner V, DO        Allergies as of 02/18/2023 - Review Complete 02/18/2023  Allergen Reaction Noted   Levaquin [levofloxacin in d5w] Anaphylaxis 08/26/2013   Ca phosphate-cholecalciferol Hives 02/21/2009   Cvs yogurt + calcium [ca phosphate-cholecalciferol]  08/28/2020   [place] oil Hives 02/21/2009   Penicillins Swelling 03/14/2017    Family History  Problem Relation Age of Onset   Breast cancer Mother    Cancer Mother    Diabetes Mellitus II Father    Diabetes Father    Esophageal cancer Maternal Grandmother    Breast cancer Maternal Grandmother    Colon cancer Neg Hx    Colon polyps Neg Hx    Rectal cancer Neg Hx    Stomach cancer Neg Hx     Social History   Socioeconomic History   Marital status: Single    Spouse name: Not on file   Number of children: Not on file   Years of education: Not on file   Highest education level: Not on file  Occupational History   Not on file  Tobacco Use   Smoking status: Every Day     Packs/day: 0.25    Types: Cigarettes   Smokeless tobacco: Current  Vaping Use   Vaping Use: Former  Substance and Sexual Activity   Alcohol use: No   Drug use: No   Sexual activity: Not Currently  Other Topics Concern   Not on file  Social History Narrative   Not on file   Social Determinants of Health   Financial Resource Strain: Low Risk  (06/30/2019)   Overall Financial Resource Strain (CARDIA)    Difficulty of Paying Living Expenses: Not very hard  Food Insecurity: Unknown (06/30/2019)   Hunger Vital Sign    Worried About  Running Out of Food in the Last Year: Patient refused    [place] in the Last Year: Patient refused  Transportation Needs: No Transportation Needs (06/30/2019)   PRAPARE - Hydrologist (Medical): No    Lack of Transportation (Non-Medical): No  Physical Activity: Unknown (06/30/2019)   Exercise Vital Sign    Days of Exercise per Week: Patient refused    Minutes of Exercise per Session: Patient refused  Stress: Stress Concern Present (06/30/2019)   [place]    Feeling of Stress : Rather much  Social Connections: Unknown (06/30/2019)   Social Connection and Isolation Panel [NHANES]    Frequency of Communication with Friends and Family: More than three times a week    Frequency of Social Gatherings with Friends and Family: More than three times a week    Attends Religious Services: Patient refused    Active Member of Clubs or Organizations: Patient refused    Attends Archivist Meetings: Patient refused    Marital Status: Patient refused  Intimate Partner Violence: Not At Risk (06/30/2019)   Humiliation, Afraid, Rape, and Kick questionnaire    Fear of Current or Ex-Partner: No    Emotionally Abused: No    Physically Abused: No    Sexually Abused: No    Physical Exam: Vital signs in last 24 hours: '@BP'$  (!) 97/50   Pulse 86   Temp (!) 97.5 F  (36.4 C)   Ht '5\' 7"'$  (1.702 m)   Wt 203 lb (92.1 kg)   LMP 01/12/2017   SpO2 100%   BMI 31.79 kg/m  GEN: NAD EYE: Sclerae anicteric ENT: MMM CV: Non-tachycardic Pulm: CTA b/l GI: Soft, NT/ND NEURO:  Alert & Oriented x 3   Gerrit Heck, DO [facility]   02/18/2023 9:34 AM

## 2023-02-18 NOTE — Progress Notes (Signed)
Pt's states no medical or surgical changes since previsit or office visit. 

## 2023-02-18 NOTE — Patient Instructions (Addendum)
-  Resume previous diet. - Continue present medications. - Await pathology results. - Repeat colonoscopy for surveillance based on pathology results. - Return to GI office PRN.   YOU HAD AN ENDOSCOPIC PROCEDURE TODAY AT [facility]:   Refer to the procedure report that was given to you for any specific questions about what was found during the examination.  If the procedure report does not answer your questions, please call your gastroenterologist to clarify.  If you requested that your care partner not be given the details of your procedure findings, then the procedure report has been included in a sealed envelope for you to review at your convenience later.  YOU SHOULD EXPECT: Some feelings of bloating in the abdomen. Passage of more gas than usual.  Walking can help get rid of the air that was put into your GI tract during the procedure and reduce the bloating. If you had a lower endoscopy (such as a colonoscopy or flexible sigmoidoscopy) you may notice spotting of blood in your stool or on the toilet paper. If you underwent a bowel prep for your procedure, you may not have a normal bowel movement for a few days.  Please Note:  You might notice some irritation and congestion in your nose or some drainage.  This is from the oxygen used during your procedure.  There is no need for concern and it should clear up in a day or so.  SYMPTOMS TO REPORT IMMEDIATELY:  Following lower endoscopy (colonoscopy or flexible sigmoidoscopy):  Excessive amounts of blood in the stool  Significant tenderness or worsening of abdominal pains  Swelling of the abdomen that is new, acute  Fever of 100F or higher   For urgent or emergent issues, a gastroenterologist can be reached at any hour by calling [phone]. Do not use MyChart messaging for urgent concerns.    DIET:  We do recommend a small meal at first, but then you may proceed to your regular diet.  Drink plenty of fluids but you  should avoid alcoholic beverages for 24 hours.  ACTIVITY:  You should plan to take it easy for the rest of today and you should NOT DRIVE or use heavy machinery until tomorrow (because of the sedation medicines used during the test).    FOLLOW UP: Our staff will call the number listed on your records the next business day following your procedure.  We will call around 7:15- 8:00 am to check on you and address any questions or concerns that you may have regarding the information given to you following your procedure. If we do not reach you, we will leave a message.     If any biopsies were taken you will be contacted by phone or by letter within the next 1-3 weeks.  Please call us at [phone] if you have not heard about the biopsies in 3 weeks.    SIGNATURES/CONFIDENTIALITY: You and/or your care partner have signed paperwork which will be entered into your electronic medical record.  These signatures attest to the fact that that the information above on your After Visit Summary has been reviewed and is understood.  Full responsibility of the confidentiality of this discharge information lies with you and/or your care-partner.

## 2023-02-18 NOTE — Op Note (Signed)
[facility] Patient Name: Tina Mayo Procedure Date: 02/18/2023 9:37 AM MRN: [id number] Endoscopist: Gerrit Heck , MD, [id number] Age: 52 Referring MD:  Date of Birth: 18-Jun-1971 Gender: Female Account #: [id number] Procedure:                Colonoscopy Indications:              Screening for colorectal malignant neoplasm, This                            is the patient's first colonoscopy Medicines:                Monitored Anesthesia Care Procedure:                Pre-Anesthesia Assessment:                           - Prior to the procedure, a History and Physical                            was performed, and patient medications and                            allergies were reviewed. The patient's tolerance of                            previous anesthesia was also reviewed. The risks                            and benefits of the procedure and the sedation                            options and risks were discussed with the patient.                            All questions were answered, and informed consent                            was obtained. Prior Anticoagulants: The patient has                            taken no anticoagulant or antiplatelet agents. ASA                            Grade Assessment: II - A patient with mild systemic                            disease. After reviewing the risks and benefits,                            the patient was deemed in satisfactory condition to                            undergo the procedure.  After obtaining informed consent, the colonoscope                            was passed under direct vision. Throughout the                            procedure, the patient's blood pressure, pulse, and                            oxygen saturations were monitored continuously. The                            CF [id number] was introduced through the anus                            and advanced to the  the cecum, identified by                            appendiceal orifice and ileocecal valve. The                            colonoscopy was performed without difficulty. The                            patient tolerated the procedure well. The quality                            of the bowel preparation was good. The ileocecal                            valve, appendiceal orifice, and rectum were                            photographed. Scope In: 9:41:15 AM Scope Out: 9:58:54 AM Scope Withdrawal Time: 0 hours 15 minutes 31 seconds  Total Procedure Duration: 0 hours 17 minutes 39 seconds  Findings:                 The perianal and digital rectal examinations were                            normal.                           A 4 mm polyp was found in the transverse colon. The                            polyp was sessile. The polyp was removed with a                            cold snare. Resection and retrieval were complete.                            Estimated blood loss was minimal.  The exam was otherwise normal throughout the                            remainder of the colon.                           The retroflexed view of the distal rectum and anal                            verge was normal and showed no anal or rectal                            abnormalities. Complications:            No immediate complications. Estimated Blood Loss:     Estimated blood loss was minimal. Impression:               - One 4 mm polyp in the transverse colon, removed                            with a cold snare. Resected and retrieved.                           - Otherwise, normal colonsocopy.                           - The distal rectum and anal verge are normal on                            retroflexion view.                           - The GI Genius (intelligent endoscopy module),                            computer-aided polyp detection system powered by AI                             was utilized to detect colorectal polyps through                            enhanced visualization during colonoscopy. Recommendation:           - Patient has a contact number available for                            emergencies. The signs and symptoms of potential                            delayed complications were discussed with the                            patient. Return to normal activities tomorrow.                            Written discharge instructions  were provided to the                            patient.                           - Resume previous diet.                           - Continue present medications.                           - Await pathology results.                           - Repeat colonoscopy for surveillance based on                            pathology results.                           - Return to GI office PRN. Gerrit Heck, MD 02/18/2023 10:05:02 AM

## 2023-02-18 NOTE — Progress Notes (Signed)
Called to room to assist during endoscopic procedure.  Patient ID and intended procedure confirmed with present staff. Received instructions for my participation in the procedure from the performing physician.  

## 2023-02-18 NOTE — Progress Notes (Signed)
Report to PACU, RN, vss, BBS= Clear.  

## 2023-02-19 ENCOUNTER — Telehealth: Payer: Self-pay

## 2023-02-19 NOTE — Telephone Encounter (Signed)
Unable to leave message mailbox full.

## 2023-02-28 ENCOUNTER — Encounter: Payer: Self-pay | Admitting: Gastroenterology

## 2023-04-16 ENCOUNTER — Other Ambulatory Visit: Payer: Self-pay | Admitting: Internal Medicine

## 2023-04-16 DIAGNOSIS — Z1231 Encounter for screening mammogram for malignant neoplasm of breast: Secondary | ICD-10-CM

## 2023-04-29 ENCOUNTER — Ambulatory Visit
Admission: RE | Admit: 2023-04-29 | Discharge: 2023-04-29 | Disposition: A | Discharge status: Home / Self Care | Payer: Medicaid Other | Source: Ambulatory Visit | Attending: Internal Medicine | Admitting: Internal Medicine

## 2023-04-29 DIAGNOSIS — Z1231 Encounter for screening mammogram for malignant neoplasm of breast: Secondary | ICD-10-CM

## 2023-04-30 ENCOUNTER — Ambulatory Visit (HOSPITAL_COMMUNITY)
Admission: EM | Admit: 2023-04-30 | Discharge: 2023-04-30 | Disposition: A | Discharge status: Home / Self Care | Payer: Medicaid Other | Attending: Internal Medicine | Admitting: Internal Medicine

## 2023-04-30 ENCOUNTER — Encounter (HOSPITAL_COMMUNITY): Payer: Self-pay | Admitting: Emergency Medicine

## 2023-04-30 ENCOUNTER — Other Ambulatory Visit: Payer: Self-pay

## 2023-04-30 ENCOUNTER — Ambulatory Visit (INDEPENDENT_AMBULATORY_CARE_PROVIDER_SITE_OTHER): Payer: Medicaid Other

## 2023-04-30 DIAGNOSIS — M79605 Pain in left leg: Secondary | ICD-10-CM

## 2023-04-30 MED ORDER — METHYLPREDNISOLONE SODIUM SUCC 125 MG IJ SOLR
INTRAMUSCULAR | Status: AC
  Filled 2023-04-30: qty 2

## 2023-04-30 MED ORDER — KETOROLAC TROMETHAMINE 30 MG/ML IJ SOLN
INTRAMUSCULAR | Status: AC
  Filled 2023-04-30: qty 1

## 2023-04-30 MED ORDER — METHYLPREDNISOLONE SODIUM SUCC 125 MG IJ SOLR
80.0000 mg | Freq: Once | INTRAMUSCULAR | Status: AC
  Administered 2023-04-30: 80 mg via INTRAMUSCULAR

## 2023-04-30 MED ORDER — MELOXICAM 15 MG PO TABS: 15.0000 mg | ORAL_TABLET | Freq: Every day | ORAL | 0 refills | Status: AC

## 2023-04-30 MED ORDER — KETOROLAC TROMETHAMINE 30 MG/ML IJ SOLN
30.0000 mg | Freq: Once | INTRAMUSCULAR | Status: AC
  Administered 2023-04-30: 30 mg via INTRAMUSCULAR

## 2023-04-30 NOTE — ED Triage Notes (Signed)
Reports left leg pain started yesterday.  Fell yesterday carrying water up steps.  Tripped on steps and reports striking upper thigh/hip on step.   Took ibuprofen last night  Has pain whether sitting or standing bearing weight

## 2023-04-30 NOTE — ED Provider Notes (Signed)
[facility]   [id number] 04/30/23 Arrival Time: 1035  ASSESSMENT & PLAN:  1. Left leg pain    -X-rays negative for fracture.  The etiology is difficult to ascertain.  I do not think she has a bony pathology here.  This is either muscular strain or there may be a component of complex regional pain to this as it is made worse by cold air.  Meralgia paresthetica is also on the differential but less likely since her symptoms are more medial/anterior.  Will treat the patient symptomatically with intramuscular injections of Toradol and Solu-Medrol today.  Also called in meloxicam 15 mg take once daily for anti-inflammatory.  Will give her a work note for a few days off to rest.  She can weight-bear as tolerated.  All questions answered and she agrees to plan.  Meds ordered this encounter  Medications   ketorolac (TORADOL) 30 MG/ML injection 30 mg   methylPREDNISolone sodium succinate (SOLU-MEDROL) 125 mg/2 mL injection 80 mg   meloxicam (MOBIC) 15 MG tablet    Sig: Take 1 tablet (15 mg total) by mouth daily.    Dispense:  30 tablet    Refill:  0   Discharge Instructions   None       Reviewed expectations re: course of current medical issues. Questions answered. Outlined signs and symptoms indicating need for more acute intervention. Patient verbalized understanding. After Visit Summary given.   SUBJECTIVE: Pleasant 52 year old female comes urgent care to be evaluated for left hip pain.  Started yesterday after she fell walking up some steps.  She fell on her left upper thigh/hip.  Pain has been sharp and stabbing constant over the anterior aspect of the hip ever since.  She has never had pain like this in the left leg before but has had pain in the right leg before.  She tried taking ibuprofen but has not made it any better.  It is made worse with cold air.  Felt better with putting heat on it.  No radicular symptoms.  No radiation of the pain.  She does smoke 5 cigarettes a  day.  She does not drink alcohol.  She does have a history of sickle cell trait.  Patient's last menstrual period was 01/12/2017. Past Surgical History:  Procedure Laterality Date   benign breast tumor removed     2006   BREAST BIOPSY Right 2014   benign   BREAST EXCISIONAL BIOPSY Right 2007   benign    CHOLECYSTECTOMY N/A 12/16/2018   Procedure: LAPAROSCOPIC CHOLECYSTECTOMY;  Surgeon: Berna Bue, MD;  Location: [facility] OR;  Service: General;  Laterality: N/A;   right ankle surgery     2010   TOTAL SHOULDER ARTHROPLASTY Right 06/29/2019   Procedure: TOTAL SHOULDER ARTHROPLASTY;  Surgeon: Teryl Lucy, MD;  Location: [facility] ORS;  Service: Orthopedics;  Laterality: Right;   TUBAL LIGATION       OBJECTIVE:  There were no vitals filed for this visit.   Physical Exam Vitals and nursing note reviewed.  Constitutional:      General: She is in acute distress (moderately uncomfortable sitting in chair).  Cardiovascular:     Rate and Rhythm: Normal rate.  Pulmonary:     Effort: Pulmonary effort is normal.  Musculoskeletal:     Comments: L Hip -no obvious deformity.  No overlying skin changes.  She is tender to palpation anterior aspect of the hip.  Nontender palpation over the posterior lateral aspect of the hip.  Nontender palpation of  the right trochanter.  I am able to passively flex the hip but this causes her pain in the anterior part of the hip.  Pain with internal rotation.  No pain with external rotation.  4/5 strength with hip flexion.  She is able to weight-bear on the leg.  She walks with a limp  Neurological:     Mental Status: She is alert.      Labs: Results for orders placed or performed in visit on 12/10/22  Lipid panel  Result Value Ref Range   Cholesterol 173 <200 mg/dL   HDL 35 (L) > OR = 50 mg/dL   Triglycerides 161 (H) <150 mg/dL   LDL Cholesterol (Calc) 101 (H) mg/dL (calc)   Total CHOL/HDL Ratio 4.9 <5.0 (calc)   Non-HDL Cholesterol (Calc) 138 (H) <130  mg/dL (calc)  COMPLETE METABOLIC PANEL WITH GFR  Result Value Ref Range   Glucose, Bld 87 65 - 99 mg/dL   BUN 7 7 - 25 mg/dL   Creat 0.96 0.45 - 4.09 mg/dL   eGFR 811 > OR = 60 BJ/YNW/2.95A2   BUN/Creatinine Ratio SEE NOTE: 6 - 22 (calc)   Sodium 142 135 - 146 mmol/L   Potassium 4.3 3.5 - 5.3 mmol/L   Chloride 112 (H) 98 - 110 mmol/L   CO2 19 (L) 20 - 32 mmol/L   Calcium 9.1 8.6 - 10.4 mg/dL   Total Protein 7.0 6.1 - 8.1 g/dL   Albumin 4.3 3.6 - 5.1 g/dL   Globulin 2.7 1.9 - 3.7 g/dL (calc)   AG Ratio 1.6 1.0 - 2.5 (calc)   Total Bilirubin 1.0 0.2 - 1.2 mg/dL   Alkaline phosphatase (APISO) 102 37 - 153 U/L   AST 23 10 - 35 U/L   ALT 13 6 - 29 U/L  VITAMIN D 25 Hydroxy (Vit-D Deficiency, Fractures)  Result Value Ref Range   Vit D, 25-Hydroxy 28 (L) 30 - 100 ng/mL  TSH  Result Value Ref Range   TSH 2.42 mIU/L  CBC  Result Value Ref Range   WBC 13.1 (H) 3.8 - 10.8 Thousand/uL   RBC 3.77 (L) 3.80 - 5.10 Million/uL   Hemoglobin 11.3 (L) 11.7 - 15.5 g/dL   HCT 13.0 (L) 86.5 - 78.4 %   MCV 88.6 80.0 - 100.0 fL   MCH 30.0 27.0 - 33.0 pg   MCHC 33.8 32.0 - 36.0 g/dL   RDW 69.6 29.5 - 28.4 %   Platelets 106 (L) 140 - 400 Thousand/uL   MPV 13.3 (H) 7.5 - 12.5 fL   Labs Reviewed - No data to display  Imaging: DG Hip Unilat With Pelvis 2-3 Views Left  Result Date: 04/30/2023 CLINICAL DATA:  Recent fall, pain EXAM: DG HIP (WITH OR WITHOUT PELVIS) 2-3V LEFT COMPARISON:  None Available. FINDINGS: No recent fracture or dislocation is seen. There is mixed density in both femoral heads, more so on the left side. There is no significant joint space narrowing in the hips. Last lumbar vertebra is transitional. IMPRESSION: No recent fracture or dislocation is seen. Mixed density seen in both femoral heads may be due to degenerative arthritis. Electronically Signed   By: Ernie Avena M.D.   On: 04/30/2023 12:20     Allergies  Allergen Reactions   Levaquin [Levofloxacin In D5w]  Anaphylaxis   Ca Phosphate-Cholecalciferol Hives   Cvs Yogurt + Calcium [Ca Phosphate-Cholecalciferol]    Origanum Oil Hives   Penicillins Swelling    DID THE REACTION INVOLVE: Swelling of  the face/tongue/throat, SOB, or low BP? Yes Sudden or severe rash/hives, skin peeling, or the inside of the mouth or nose? No Did it require medical treatment? Yes When did it last happen?   8 years ago    If all above answers are "NO", may proceed with cephalosporin use.                                                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   Bell's palsy    Depression    Sickle cell trait (HCC)    Sleep apnea     Social History   Socioeconomic History   Marital status: Single    Spouse name: Not on file   Number of children: Not on file   Years of education: Not on file   Highest education level: Not on file  Occupational History   Not on file  Tobacco Use   Smoking status: Every Day    Packs/day: .25    Types: Cigarettes   Smokeless tobacco: Current  Vaping Use   Vaping Use: Former  Substance and Sexual Activity   Alcohol use: No   Drug use: No   Sexual activity: Not Currently  Other Topics Concern   Not on file  Social History Narrative   Not on file   Social Determinants of Health   Financial Resource Strain: Low Risk  (06/30/2019)   Overall Financial Resource Strain (CARDIA)    Difficulty of Paying Living Expenses: Not very hard  Food Insecurity: Unknown (06/30/2019)   Hunger Vital Sign    Worried About Running Out of Food in the Last Year: Patient declined    Ran Out of Food in the Last Year: Patient declined  Transportation Needs: No Transportation Needs (06/30/2019)   PRAPARE - Administrator, Civil Service (Medical): No    Lack of Transportation (Non-Medical): No  Physical Activity: Unknown (06/30/2019)   Exercise Vital Sign    Days of Exercise per Week:  Patient declined    Minutes of Exercise per Session: Patient declined  Stress: Stress Concern Present (06/30/2019)   [employer] of Occupational Health - Occupational Stress Questionnaire    Feeling of Stress : Rather much  Social Connections: Unknown (06/30/2019)   Social Connection and Isolation Panel [NHANES]    Frequency of Communication with Friends and Family: More than three times a week    Frequency of Social Gatherings with Friends and Family: More than three times a week    Attends Religious Services: Patient declined    Database administrator or Organizations: Patient declined    Attends Banker Meetings: Patient declined    Marital Status: Patient declined  Intimate Partner Violence: Not At Risk (06/30/2019)   Humiliation, Afraid, Rape, and Kick questionnaire    Fear of Current or Ex-Partner: No    Emotionally Abused: No    Physically Abused: No    Sexually Abused: No    Family History  Problem Relation Age of Onset   Breast cancer Mother    Cancer Mother    Diabetes Mellitus II Father    Diabetes Father    Esophageal cancer Maternal Grandmother    Breast cancer Maternal Grandmother  Colon cancer Neg Hx    Colon polyps Neg Hx    Rectal cancer Neg Hx    Stomach cancer Neg Hx       Jaedynn Bohlken, Baldemar Friday, MD 04/30/23 1313

## 2023-05-28 ENCOUNTER — Other Ambulatory Visit: Payer: Self-pay | Admitting: Sports Medicine

## 2023-07-05 ENCOUNTER — Inpatient Hospital Stay (HOSPITAL_COMMUNITY)
Admission: EM | Admit: 2023-07-05 | Discharge: 2023-07-10 | DRG: 194 | Disposition: A | Discharge status: Home / Self Care | Payer: Medicaid Other | Attending: Internal Medicine | Admitting: Internal Medicine

## 2023-07-05 ENCOUNTER — Other Ambulatory Visit: Payer: Self-pay

## 2023-07-05 ENCOUNTER — Inpatient Hospital Stay (HOSPITAL_COMMUNITY): Payer: Medicaid Other

## 2023-07-05 ENCOUNTER — Emergency Department (HOSPITAL_COMMUNITY): Payer: Medicaid Other

## 2023-07-05 ENCOUNTER — Encounter (HOSPITAL_COMMUNITY): Payer: Self-pay

## 2023-07-05 DIAGNOSIS — J189 Pneumonia, unspecified organism: Secondary | ICD-10-CM | POA: Diagnosis not present

## 2023-07-05 DIAGNOSIS — I517 Cardiomegaly: Secondary | ICD-10-CM | POA: Diagnosis present

## 2023-07-05 DIAGNOSIS — J181 Lobar pneumonia, unspecified organism: Principal | ICD-10-CM | POA: Diagnosis present

## 2023-07-05 DIAGNOSIS — Z720 Tobacco use: Secondary | ICD-10-CM | POA: Diagnosis present

## 2023-07-05 DIAGNOSIS — Z1152 Encounter for screening for COVID-19: Secondary | ICD-10-CM

## 2023-07-05 DIAGNOSIS — M8789 Other osteonecrosis, multiple sites: Secondary | ICD-10-CM | POA: Diagnosis present

## 2023-07-05 DIAGNOSIS — R0789 Other chest pain: Secondary | ICD-10-CM | POA: Diagnosis not present

## 2023-07-05 DIAGNOSIS — R9389 Abnormal findings on diagnostic imaging of other specified body structures: Secondary | ICD-10-CM

## 2023-07-05 DIAGNOSIS — Z888 Allergy status to other drugs, medicaments and biological substances status: Secondary | ICD-10-CM

## 2023-07-05 DIAGNOSIS — F32A Depression, unspecified: Secondary | ICD-10-CM | POA: Diagnosis present

## 2023-07-05 DIAGNOSIS — J440 Chronic obstructive pulmonary disease with acute lower respiratory infection: Secondary | ICD-10-CM | POA: Diagnosis present

## 2023-07-05 DIAGNOSIS — D869 Sarcoidosis, unspecified: Secondary | ICD-10-CM | POA: Diagnosis present

## 2023-07-05 DIAGNOSIS — D649 Anemia, unspecified: Secondary | ICD-10-CM

## 2023-07-05 DIAGNOSIS — R079 Chest pain, unspecified: Secondary | ICD-10-CM | POA: Diagnosis present

## 2023-07-05 DIAGNOSIS — R768 Other specified abnormal immunological findings in serum: Secondary | ICD-10-CM

## 2023-07-05 DIAGNOSIS — Z803 Family history of malignant neoplasm of breast: Secondary | ICD-10-CM

## 2023-07-05 DIAGNOSIS — E669 Obesity, unspecified: Secondary | ICD-10-CM | POA: Diagnosis present

## 2023-07-05 DIAGNOSIS — Z6832 Body mass index (BMI) 32.0-32.9, adult: Secondary | ICD-10-CM

## 2023-07-05 DIAGNOSIS — D509 Iron deficiency anemia, unspecified: Secondary | ICD-10-CM | POA: Diagnosis present

## 2023-07-05 DIAGNOSIS — F411 Generalized anxiety disorder: Secondary | ICD-10-CM | POA: Diagnosis present

## 2023-07-05 DIAGNOSIS — M069 Rheumatoid arthritis, unspecified: Secondary | ICD-10-CM | POA: Diagnosis present

## 2023-07-05 DIAGNOSIS — Z832 Family history of diseases of the blood and blood-forming organs and certain disorders involving the immune mechanism: Secondary | ICD-10-CM

## 2023-07-05 DIAGNOSIS — Z96611 Presence of right artificial shoulder joint: Secondary | ICD-10-CM | POA: Diagnosis present

## 2023-07-05 DIAGNOSIS — R590 Localized enlarged lymph nodes: Secondary | ICD-10-CM | POA: Diagnosis present

## 2023-07-05 DIAGNOSIS — Z881 Allergy status to other antibiotic agents status: Secondary | ICD-10-CM

## 2023-07-05 DIAGNOSIS — F17210 Nicotine dependence, cigarettes, uncomplicated: Secondary | ICD-10-CM | POA: Diagnosis present

## 2023-07-05 DIAGNOSIS — D696 Thrombocytopenia, unspecified: Secondary | ICD-10-CM | POA: Diagnosis present

## 2023-07-05 DIAGNOSIS — D519 Vitamin B12 deficiency anemia, unspecified: Secondary | ICD-10-CM | POA: Diagnosis present

## 2023-07-05 DIAGNOSIS — G510 Bell's palsy: Secondary | ICD-10-CM | POA: Diagnosis present

## 2023-07-05 DIAGNOSIS — Z880 Allergy status to penicillin: Secondary | ICD-10-CM

## 2023-07-05 DIAGNOSIS — Z79899 Other long term (current) drug therapy: Secondary | ICD-10-CM

## 2023-07-05 DIAGNOSIS — Z800 Family history of malignant neoplasm of digestive organs: Secondary | ICD-10-CM

## 2023-07-05 DIAGNOSIS — Z833 Family history of diabetes mellitus: Secondary | ICD-10-CM | POA: Diagnosis not present

## 2023-07-05 DIAGNOSIS — D638 Anemia in other chronic diseases classified elsewhere: Secondary | ICD-10-CM | POA: Diagnosis present

## 2023-07-05 DIAGNOSIS — D573 Sickle-cell trait: Secondary | ICD-10-CM | POA: Diagnosis present

## 2023-07-05 DIAGNOSIS — Z9109 Other allergy status, other than to drugs and biological substances: Secondary | ICD-10-CM

## 2023-07-05 LAB — ECHOCARDIOGRAM COMPLETE
Area-P 1/2: 3.37 cm2
Height: 67 in
S' Lateral: 2.8 cm
Weight: 3360 oz

## 2023-07-05 LAB — RESPIRATORY PANEL BY PCR

## 2023-07-05 LAB — SARS CORONAVIRUS 2 BY RT PCR: SARS Coronavirus 2 by RT PCR: NEGATIVE

## 2023-07-05 LAB — HEPATIC FUNCTION PANEL
ALT: 12 U/L (ref 0–44)
AST: 16 U/L (ref 15–41)
Albumin: 3.5 g/dL (ref 3.5–5.0)
Alkaline Phosphatase: 89 U/L (ref 38–126)
Bilirubin, Direct: <0.1 mg/dL (ref 0.0–0.2)
Total Bilirubin: 1 mg/dL (ref 0.3–1.2)
Total Protein: 7.1 g/dL (ref 6.5–8.1)

## 2023-07-05 LAB — BASIC METABOLIC PANEL
Anion gap: 11 (ref 5–15)
BUN: <5 mg/dL — ABNORMAL LOW (ref 6–20)
CO2: 23 mmol/L (ref 22–32)
Calcium: 9 mg/dL (ref 8.9–10.3)
Chloride: 104 mmol/L (ref 98–111)
Creatinine, Ser: 0.75 mg/dL (ref 0.44–1.00)
GFR, Estimated: >60 mL/min (ref >60)
Glucose, Bld: 116 mg/dL — ABNORMAL HIGH (ref 70–99)
Potassium: 3.7 mmol/L (ref 3.5–5.1)
Sodium: 138 mmol/L (ref 135–145)

## 2023-07-05 LAB — CBC
HCT: 28.3 % — ABNORMAL LOW (ref 36.0–46.0)
Hemoglobin: 10.2 g/dL — ABNORMAL LOW (ref 12.0–15.0)
MCH: 29.2 pg (ref 26.0–34.0)
MCHC: 36 g/dL (ref 30.0–36.0)
MCV: 81.1 fL (ref 80.0–100.0)
Platelets: 82 10*3/uL — ABNORMAL LOW (ref 150–400)
RBC: 3.49 MIL/uL — ABNORMAL LOW (ref 3.87–5.11)
RDW: 14.9 % (ref 11.5–15.5)
WBC: 13.2 10*3/uL — ABNORMAL HIGH (ref 4.0–10.5)
nRBC: 0 % (ref 0.0–0.2)

## 2023-07-05 LAB — TROPONIN I (HIGH SENSITIVITY)
Troponin I (High Sensitivity): 4 ng/L (ref <18)
Troponin I (High Sensitivity): 4 ng/L (ref <18)

## 2023-07-05 LAB — LIPASE, BLOOD: Lipase: 23 U/L (ref 11–51)

## 2023-07-05 LAB — PROCALCITONIN: Procalcitonin: <0.1 ng/mL

## 2023-07-05 LAB — BRAIN NATRIURETIC PEPTIDE: B Natriuretic Peptide: 300.7 pg/mL — ABNORMAL HIGH (ref 0.0–100.0)

## 2023-07-05 LAB — PHOSPHORUS: Phosphorus: 3.6 mg/dL (ref 2.5–4.6)

## 2023-07-05 LAB — MAGNESIUM: Magnesium: 1.8 mg/dL (ref 1.7–2.4)

## 2023-07-05 LAB — SEDIMENTATION RATE: Sed Rate: 24 mm/hr — ABNORMAL HIGH (ref 0–22)

## 2023-07-05 LAB — C-REACTIVE PROTEIN: CRP: 4.2 mg/dL — ABNORMAL HIGH (ref <1.0)

## 2023-07-05 MED ORDER — VANCOMYCIN HCL 1500 MG/300ML IV SOLN
1500.0000 mg | Freq: Once | INTRAVENOUS | Status: AC
  Administered 2023-07-05: 1500 mg via INTRAVENOUS
  Filled 2023-07-05: qty 300

## 2023-07-05 MED ORDER — PREDNISONE 20 MG PO TABS: 30.0000 mg | ORAL_TABLET | Freq: Every day | ORAL | Status: DC

## 2023-07-05 MED ORDER — NICOTINE 14 MG/24HR TD PT24: 14.0000 mg | MEDICATED_PATCH | Freq: Every day | TRANSDERMAL | Status: DC | PRN

## 2023-07-05 MED ORDER — ARIPIPRAZOLE 10 MG PO TABS
10.0000 mg | ORAL_TABLET | Freq: Every day | ORAL | Status: DC
  Administered 2023-07-05 – 2023-07-09 (×5): 10 mg via ORAL
  Filled 2023-07-05 (×6): qty 1

## 2023-07-05 MED ORDER — BENZONATATE 100 MG PO CAPS
200.0000 mg | ORAL_CAPSULE | Freq: Three times a day (TID) | ORAL | Status: DC | PRN
  Administered 2023-07-07: 200 mg via ORAL
  Filled 2023-07-05: qty 2

## 2023-07-05 MED ORDER — ONDANSETRON HCL 4 MG/2ML IJ SOLN: 4.0000 mg | Freq: Four times a day (QID) | INTRAMUSCULAR | Status: DC | PRN

## 2023-07-05 MED ORDER — MORPHINE SULFATE (PF) 4 MG/ML IV SOLN
4.0000 mg | Freq: Once | INTRAVENOUS | Status: AC
  Administered 2023-07-05: 4 mg via INTRAVENOUS
  Filled 2023-07-05: qty 1

## 2023-07-05 MED ORDER — FERROUS SULFATE 325 (65 FE) MG PO TABS
325.0000 mg | ORAL_TABLET | Freq: Every day | ORAL | Status: DC
  Administered 2023-07-05 – 2023-07-10 (×6): 325 mg via ORAL
  Filled 2023-07-05 (×6): qty 1

## 2023-07-05 MED ORDER — NALOXONE HCL 0.4 MG/ML IJ SOLN: 0.4000 mg | INTRAMUSCULAR | Status: DC | PRN

## 2023-07-05 MED ORDER — SODIUM CHLORIDE 0.9 % IV SOLN
2.0000 g | INTRAVENOUS | Status: AC
  Administered 2023-07-05 – 2023-07-09 (×5): 2 g via INTRAVENOUS
  Filled 2023-07-05 (×6): qty 20

## 2023-07-05 MED ORDER — ALUM & MAG HYDROXIDE-SIMETH 200-200-20 MG/5ML PO SUSP: 30.0000 mL | Freq: Once | ORAL | Status: DC

## 2023-07-05 MED ORDER — FENTANYL CITRATE PF 50 MCG/ML IJ SOSY
25.0000 ug | PREFILLED_SYRINGE | Freq: Once | INTRAMUSCULAR | Status: AC
  Administered 2023-07-05: 25 ug via INTRAVENOUS
  Filled 2023-07-05: qty 1

## 2023-07-05 MED ORDER — PREDNISONE 20 MG PO TABS
40.0000 mg | ORAL_TABLET | Freq: Every day | ORAL | Status: DC
  Administered 2023-07-07: 40 mg via ORAL
  Filled 2023-07-05: qty 2

## 2023-07-05 MED ORDER — ACETAMINOPHEN 650 MG RE SUPP: 650.0000 mg | Freq: Four times a day (QID) | RECTAL | Status: DC | PRN

## 2023-07-05 MED ORDER — MELATONIN 3 MG PO TABS: 3.0000 mg | ORAL_TABLET | Freq: Every evening | ORAL | Status: DC | PRN

## 2023-07-05 MED ORDER — METHYLPREDNISOLONE SODIUM SUCC 40 MG IJ SOLR
40.0000 mg | Freq: Every day | INTRAMUSCULAR | Status: AC
  Administered 2023-07-06: 40 mg via INTRAVENOUS
  Filled 2023-07-05: qty 1

## 2023-07-05 MED ORDER — METHYLPREDNISOLONE SODIUM SUCC 125 MG IJ SOLR
80.0000 mg | INTRAMUSCULAR | Status: DC
  Administered 2023-07-05: 80 mg via INTRAVENOUS
  Filled 2023-07-05: qty 2

## 2023-07-05 MED ORDER — MORPHINE SULFATE (PF) 4 MG/ML IV SOLN
4.0000 mg | INTRAVENOUS | Status: DC | PRN
  Administered 2023-07-05 (×2): 4 mg via INTRAVENOUS
  Filled 2023-07-05 (×2): qty 1

## 2023-07-05 MED ORDER — FENTANYL CITRATE PF 50 MCG/ML IJ SOSY
50.0000 ug | PREFILLED_SYRINGE | INTRAMUSCULAR | Status: DC | PRN
  Administered 2023-07-05: 50 ug via INTRAVENOUS
  Filled 2023-07-05: qty 1

## 2023-07-05 MED ORDER — FAMOTIDINE IN NACL 20-0.9 MG/50ML-% IV SOLN
20.0000 mg | Freq: Once | INTRAVENOUS | Status: AC
  Administered 2023-07-05: 20 mg via INTRAVENOUS
  Filled 2023-07-05: qty 50

## 2023-07-05 MED ORDER — IOHEXOL 350 MG/ML SOLN
75.0000 mL | Freq: Once | INTRAVENOUS | Status: AC | PRN
  Administered 2023-07-05: 75 mL via INTRAVENOUS

## 2023-07-05 MED ORDER — POLYETHYLENE GLYCOL 3350 17 G PO PACK
17.0000 g | PACK | Freq: Every day | ORAL | Status: DC
  Administered 2023-07-05 – 2023-07-08 (×4): 17 g via ORAL
  Filled 2023-07-05 (×5): qty 1

## 2023-07-05 MED ORDER — SERTRALINE HCL 100 MG PO TABS
100.0000 mg | ORAL_TABLET | Freq: Every day | ORAL | Status: DC
  Administered 2023-07-05 – 2023-07-10 (×6): 100 mg via ORAL
  Filled 2023-07-05 (×6): qty 1

## 2023-07-05 MED ORDER — SODIUM CHLORIDE 0.9 % IV SOLN
2.0000 g | Freq: Once | INTRAVENOUS | Status: AC
  Administered 2023-07-05: 2 g via INTRAVENOUS
  Filled 2023-07-05: qty 10

## 2023-07-05 MED ORDER — PREDNISONE 20 MG PO TABS: 20.0000 mg | ORAL_TABLET | Freq: Every day | ORAL | Status: DC

## 2023-07-05 MED ORDER — OXYCODONE HCL 5 MG PO TABS
5.0000 mg | ORAL_TABLET | ORAL | Status: DC | PRN
  Administered 2023-07-06 – 2023-07-07 (×5): 5 mg via ORAL
  Filled 2023-07-05 (×5): qty 1

## 2023-07-05 MED ORDER — SODIUM CHLORIDE 0.9 % IV SOLN
100.0000 mg | Freq: Two times a day (BID) | INTRAVENOUS | Status: AC
  Administered 2023-07-05 – 2023-07-09 (×10): 100 mg via INTRAVENOUS
  Filled 2023-07-05 (×10): qty 100

## 2023-07-05 MED ORDER — ACETAMINOPHEN 325 MG PO TABS
650.0000 mg | ORAL_TABLET | Freq: Four times a day (QID) | ORAL | Status: DC | PRN
  Administered 2023-07-06: 650 mg via ORAL
  Filled 2023-07-05 (×2): qty 2

## 2023-07-05 MED ORDER — ALUM & MAG HYDROXIDE-SIMETH 200-200-20 MG/5ML PO SUSP
30.0000 mL | Freq: Once | ORAL | Status: AC
  Administered 2023-07-05: 30 mL via ORAL
  Filled 2023-07-05: qty 30

## 2023-07-05 NOTE — Progress Notes (Addendum)
PROGRESS NOTE    Tina Mayo  XBJ:[id number] DOB: [birth year] DOA: 07/05/2023 PCP: Pcp, No  Chief Complaint  Patient presents with   Chest Pain    Brief Narrative:   Tina Mayo is Tina Mayo 52 y.o. female with medical history significant for chronic iron deficiency anemia, chronic thrombocytopenia, who is admitted to [facility] on 07/05/2023 with community-acquired pneumonia after presenting from home to [facility] ED complaining of chest pain.   Assessment & Plan:   Principal Problem:   CAP (community acquired pneumonia) Active Problems:   Atypical chest pain   Thrombocytopenia (HCC)   Depression   Tobacco abuse   GAD (generalized anxiety disorder)   Chronic iron deficiency anemia  Atypical Chest Pain Treating for pneumonia as noted below, but whole picture not quite coming together as chest pain seems out of proportion Troponins negative x2 reassuringly.  CT PE protocol negative for PE. BNP elevated, will follow echo   Bilateral Lower Lobe Atelectasis vs Infiltrate Presumed Community Acquired Pneumonia Currently being treated for CAP, but no fever, chills - she does have mild leukocytosis Continue ceftriaxone, doxycycline Will add on steroids  Follow CRP, ESR I think pulmonary follow up is warranted outpatient  I'm not sure pneumonia completely explains her presentation.  Has positive anti CCP consistent with rheumatoid, not on treatment.  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  Could these or other causes be more likely contributors?   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 I see positive sickle cell screen, but no hb electrophoresis - she tells me she has trait, though I'd think avascular necrosis would not be common with trait alone? Follow hemoglobin electrophoresis  Obesity Body mass index is 32.89 kg/m.    DVT prophylaxis: SCD Code Status: full Family Communication: none Disposition:   Status is: Inpatient Remains inpatient appropriate because: none   Consultants:  none  Procedures:  Echo IMPRESSIONS     1. Left ventricular ejection fraction, by estimation, is 60 to 65%. The  left ventricle has normal function. The left ventricle has no regional  wall motion abnormalities. Left ventricular diastolic parameters were  normal.   2. Right ventricular systolic function is normal. The right ventricular  size is normal. There is normal pulmonary artery systolic pressure.   3. The mitral valve is normal in structure. No evidence of mitral valve  regurgitation.   4. The aortic valve is normal in structure. Aortic valve regurgitation is  not visualized.   5. The inferior vena cava is normal in size with greater than 50%  respiratory variability, suggesting right atrial pressure of 3 mmHg.   Conclusion(s)/Recommendation(s): Normal biventricular function without  evidence of hemodynamically significant valvular heart disease.   Antimicrobials:  Anti-infectives (From admission, onward)    Start     Dose/Rate Route Frequency Ordered Stop   07/05/23 1000  cefTRIAXone (ROCEPHIN) 2 g in sodium chloride 0.9 %  100 mL IVPB        2 g 200 mL/hr over 30 Minutes Intravenous Every 24 hours 07/05/23 0514     07/05/23 0600  doxycycline (VIBRAMYCIN) 100 mg in sodium chloride 0.9 % 250 mL IVPB         100 mg 125 mL/hr over 120 Minutes Intravenous 2 times daily 07/05/23 0500     07/05/23 0345  vancomycin (VANCOREADY) IVPB 1500 mg/300 mL        1,500 mg 150 mL/hr over 120 Minutes Intravenous  Once 07/05/23 0330 07/05/23 0635   07/05/23 0330  aztreonam (AZACTAM) 2 g in sodium chloride 0.9 % 100 mL IVPB        2 g 200 mL/hr over 30 Minutes Intravenous  Once 07/05/23 0322 07/05/23 0428       Subjective: C/o chest pain with breathing   Objective: Vitals:   07/05/23 0205 07/05/23 0515 07/05/23 0640 07/05/23 0700  BP: 127/70 (!) 112/56  118/60  Pulse: 82 71  72  Resp: 19 19  (!) 26  Temp:   99 F (37.2 C)   TempSrc:   Oral   SpO2: 100% 100%  100%  Weight:      Height:        Intake/Output Summary (Last 24 hours) at 07/05/2023 1027 Last data filed at 07/05/2023 2536 Gross per 24 hour  Intake 50 ml  Output --  Net 50 ml   Filed Weights   07/05/23 0128  Weight: 95.3 kg    Examination:  General exam: Appears calm and comfortable  Respiratory system: clear, but she's only able to take short quick breaths - significant pain with deep breaths Cardiovascular system: RRR Gastrointestinal system: Abdomen is nondistended, soft and nontender.  Central nervous system: Alert and oriented. No focal neurological deficits. Extremities: no LEE  Data Reviewed: I have personally reviewed following labs and imaging studies  CBC: Recent Labs  Lab 07/05/23 0135  WBC 13.2*  HGB 10.2*  HCT 28.3*  MCV 81.1  PLT 82*    Basic Metabolic Panel: Recent Labs  Lab 07/05/23 0135  NA 138  K 3.7  CL 104  CO2 23  GLUCOSE 116*  BUN <5*  CREATININE 0.75  CALCIUM 9.0  MG 1.8  PHOS 3.6    GFR: Estimated Creatinine Clearance: 96.4 mL/min (by C-G formula based on SCr of 0.75 mg/dL).  Liver Function Tests: Recent Labs  Lab 07/05/23 0135  AST 16  ALT 12  ALKPHOS 89  BILITOT 1.0  PROT 7.1  ALBUMIN 3.5    CBG: No results for input(s): "GLUCAP" in the last 168  hours.   Recent Results (from the past 240 hour(s))  SARS Coronavirus 2 by RT PCR (hospital order, performed in [facility] hospital lab) *cepheid single result test* Anterior Nasal Swab     Status: None   Collection Time: 07/05/23  2:15 AM   Specimen: Anterior Nasal Swab  Result Value Ref Range Status   SARS Coronavirus 2 by RT PCR NEGATIVE NEGATIVE Final    Comment: Performed at [facility], [address], [place], [place] [address]         Radiology Studies: CT Angio Chest PE W and/or Wo Contrast  Result Date: 07/05/2023 CLINICAL DATA:  Suspected pulmonary embolism. EXAM: CT ANGIOGRAPHY CHEST WITH CONTRAST TECHNIQUE: Multidetector CT imaging of the chest was performed using the standard protocol during bolus administration of intravenous contrast. Multiplanar CT image reconstructions and MIPs were obtained to evaluate the  vascular anatomy. RADIATION DOSE REDUCTION: This exam was performed according to the departmental dose-optimization program which includes automated exposure control, adjustment of the mA and/or kV according to patient size and/or use of iterative reconstruction technique. CONTRAST:  75mL OMNIPAQUE IOHEXOL 350 MG/ML SOLN COMPARISON:  August 08, 2022 FINDINGS: Cardiovascular: The thoracic aorta is normal in appearance. Satisfactory opacification of the pulmonary arteries to the segmental level. No evidence of pulmonary embolism. Normal heart size. No pericardial effusion. Mediastinum/Nodes: There is mild, stable bilateral hilar lymphadenopathy. Stable AP window and right paratracheal lymph nodes are also seen. Thyroid gland, trachea, and esophagus demonstrate no significant findings. Lungs/Pleura: Moderate to marked severity atelectasis and/or infiltrate is seen within the bilateral lower lobes. No pleural effusion or pneumothorax is identified. Upper Abdomen: Multiple surgical clips are seen within the gallbladder fossa. Musculoskeletal: No chest wall abnormality. No  acute or significant osseous findings. Review of the MIP images confirms the above findings. IMPRESSION: 1. No evidence of pulmonary embolism. 2. Moderate to marked severity bilateral lower lobe atelectasis and/or infiltrate. 3. Stable mild bilateral hilar and mediastinal lymphadenopathy. 4. Evidence of prior cholecystectomy. Electronically Signed   By: Aram Candela M.D.   On: 07/05/2023 03:16   DG Chest 2 View  Result Date: 07/05/2023 CLINICAL DATA:  Chest pain EXAM: CHEST - 2 VIEW COMPARISON:  08/08/2022 FINDINGS: Right shoulder replacement. Chronic elevation left diaphragm. Mild cardiomegaly with vascular congestion and diffuse interstitial process suggestive of minimal edema. Heterogeneous focal disease at the left base. No pneumothorax IMPRESSION: 1. Cardiomegaly with vascular congestion and mild interstitial edema. 2. Focal heterogeneous disease at the left base may reflect atelectasis or pneumonia. Electronically Signed   By: Jasmine Pang M.D.   On: 07/05/2023 02:05        Scheduled Meds:  ARIPiprazole  10 mg Oral QHS   ferrous sulfate  325 mg Oral Q breakfast   methylPREDNISolone (SOLU-MEDROL) injection  80 mg Intravenous Q24H   sertraline  100 mg Oral Daily   Continuous Infusions:  cefTRIAXone (ROCEPHIN)  IV 2 g (07/05/23 0900)   doxycycline (VIBRAMYCIN) IV Stopped (07/05/23 0847)     LOS: 0 days    Time spent: over 30 min    Lacretia Nicks, MD [facility]   To contact the attending provider between 7A-7P or the covering provider during after hours 7P-7A, please log into the web site [email or web address] and access using universal [place] password for that web site. If you do not have the password, please call the hospital operator.  07/05/2023, 9:24 AM

## 2023-07-05 NOTE — ED Provider Notes (Signed)
[facility]EMERGENCY DEPT [facility] Emergency Department Provider Note MRN:  [id number]  Arrival date & time: 07/05/23     Chief Complaint   Chest Pain   History of Present Illness   Tina Mayo is a 52 y.o. year-old female presents to the ED with chief complaint of chest pain and SOB.  Onset yesterday afternoon.  Went to sleep and woke up around 11pm with worsening pain and SOB.  States that she was given 324 mg ASA and a nitroglycerin.  She reports that she has had cough. She denies hx of CHF.  History provided by patient.   Review of Systems  Pertinent positive and negative review of systems noted in HPI.    Physical Exam   Vitals:   07/05/23 0134 07/05/23 0205  BP: 122/77 127/70  Pulse: 74 82  Resp: 18 19  Temp: 99.2 F (37.3 C)   SpO2: 100% 100%    CONSTITUTIONAL:  uncomfortable-appearing, NAD NEURO:  Alert and oriented x 3, CN 3-12 grossly intact EYES:  eyes equal and reactive ENT/NECK:  Supple, no stridor  CARDIO:  normal rate, regular rhythm, appears well-perfused  PULM:  No respiratory distress, CTAB GI/GU:  non-distended,  MSK/SPINE:  No gross deformities, no edema, moves all extremities  SKIN:  no rash, atraumatic   *Additional and/or pertinent findings included in MDM below  Diagnostic and Interventional Summary    EKG Interpretation Date/Time:  Saturday July 05 2023 01:19:15 EDT Ventricular Rate:  83 PR Interval:  166 QRS Duration:  80 QT Interval:  400 QTC Calculation: 470 R Axis:   27  Text Interpretation: Normal sinus rhythm with sinus arrhythmia Cannot rule out Anterior infarct , age undetermined Abnormal ECG Confirmed by Tilden Fossa [id number]) on 07/05/2023 1:43:59 AM       Labs Reviewed  BASIC METABOLIC PANEL - Abnormal; Notable for the following components:      Result Value   Glucose, Bld 116 (*)    BUN <5 (*)    All other components within normal limits  CBC - Abnormal; Notable for the following components:   WBC 13.2  (*)    RBC 3.49 (*)    Hemoglobin 10.2 (*)    HCT 28.3 (*)    Platelets 82 (*)    All other components within normal limits  BRAIN NATRIURETIC PEPTIDE - Abnormal; Notable for the following components:   B Natriuretic Peptide 300.7 (*)    All other components within normal limits  SARS CORONAVIRUS 2 BY RT PCR  LIPASE, BLOOD  HEPATIC FUNCTION PANEL  TROPONIN I (HIGH SENSITIVITY)  TROPONIN I (HIGH SENSITIVITY)    CT Angio Chest PE W and/or Wo Contrast  Final Result    DG Chest 2 View  Final Result      Medications  vancomycin (VANCOREADY) IVPB 1500 mg/300 mL (1,500 mg Intravenous New Bag/Given 07/05/23 0423)  morphine (PF) 4 MG/ML injection 4 mg (4 mg Intravenous Given 07/05/23 0245)  alum & mag hydroxide-simeth (MAALOX/MYLANTA) 200-200-20 MG/5ML suspension 30 mL (30 mLs Oral Given 07/05/23 0243)  famotidine (PEPCID) IVPB 20 mg premix (0 mg Intravenous Stopped 07/05/23 0338)  iohexol (OMNIPAQUE) 350 MG/ML injection 75 mL (75 mLs Intravenous Contrast Given 07/05/23 0307)  aztreonam (AZACTAM) 2 g in sodium chloride 0.9 % 100 mL IVPB (0 g Intravenous Stopped 07/05/23 0428)     Procedures  /  Critical Care Procedures  ED Course and Medical Decision Making  I have reviewed the triage vital signs, the nursing notes, and  pertinent available records from the EMR.  Social Determinants Affecting Complexity of Care: Patient has no clinically significant social determinants affecting this chief complaint..   ED Course:    Medical Decision Making Problems Addressed: Chest pain, unspecified type: undiagnosed new problem with uncertain prognosis Community acquired pneumonia, unspecified laterality: acute illness or injury  Amount and/or Complexity of Data Reviewed Labs: ordered.    Details: BNP 300 Trop 4, repeat is 4 Mild leukocytosis to 13. Radiology: ordered and independent interpretation performed.    Details: Left lower lobe infiltrate, treating with aztreonam and vanc due to  multiple allergies. ECG/medicine tests: ordered.  Risk OTC drugs. Prescription drug management. Decision regarding hospitalization.         Consultants: I consulted with Hospitalist, Dr. Arlean Hopping, who is appreciated for admitting.   Treatment and Plan: Patient's exam and diagnostic results are concerning for CAP.  Feel that patient will need admission to the hospital for further treatment and evaluation.    Final Clinical Impressions(s) / ED Diagnoses     ICD-10-CM   1. Chest pain, unspecified type  R07.9     2. Community acquired pneumonia, unspecified laterality  J18.9       ED Discharge Orders     None         Discharge Instructions Discussed with and Provided to Patient:   Discharge Instructions   None      Roxy Horseman, PA-C 07/05/23 0451    Tilden Fossa, MD 07/05/23 [phone]

## 2023-07-05 NOTE — Progress Notes (Signed)
  Echocardiogram 2D Echocardiogram has been performed.  Delcie Roch 07/05/2023, 2:12 PM

## 2023-07-05 NOTE — Progress Notes (Signed)
ED Pharmacy Antibiotic Sign Off An antibiotic consult was received from an ED provider for Vancomycin per pharmacy dosing for pneumonia. A chart review was completed to assess appropriateness.   The following one time order(s) were placed:  Vancomycin 1500 mg IV x 1  Aztreonam 2g IV x 1 by EDP  Further antibiotic and/or antibiotic pharmacy consults should be ordered by the admitting provider if indicated.   Thank you for allowing pharmacy to be a part of this patient's care.   Abran Duke, PharmD, BCPS Clinical Pharmacist Phone: [phone]

## 2023-07-05 NOTE — ED Triage Notes (Signed)
Pt had CP yesterday onset 1540 and went to sleep but woke up with same sternal pain at 2300.  Pt denies had SOB with it and states I can "barely breathe".   EMS gave 324 mg ASA PO and 1 nitroglycerin with no help.  Pt still has 10/10 substernal pain.

## 2023-07-05 NOTE — Progress Notes (Signed)
New Admission Note:   Arrival Method: ED stretcher Mental Orientation: Alert and oriented x4 Telemetry: Box 5M09 Assessment: Completed Skin: intact see assessment IV: 1) RAC 2) LAC Pain: 8/10  Tubes: none Safety Measures: Safety Fall Prevention Plan has been given, discussed and signed Admission: Completed Orientation: Patient has been orientated to the room, unit and staff.  Family: none  Orders have been reviewed and implemented. Will continue to monitor the patient. Call light has been placed within reach and bed alarm has been activated.   Norman Clay RN [facility]  Phone: [phone]

## 2023-07-05 NOTE — ED Notes (Signed)
ED TO INPATIENT HANDOFF REPORT  ED Nurse Name and Phone #: Morrie Sheldon : [phone]  S Name/Age/Gender Tina Mayo 52 y.o. female Room/Bed: 004C/004C  Code Status   Code Status: Full Code  Home/SNF/Other Home Patient oriented to: self, place, time, and situation Is this baseline? Yes   Triage Complete: Triage complete  Chief Complaint CAP (community acquired pneumonia) [J18.9]  Triage Note Pt had CP yesterday onset 1540 and went to sleep but woke up with same sternal pain at 2300.  Pt denies had SOB with it and states I can "barely breathe".   EMS gave 324 mg ASA PO and 1 nitroglycerin with no help.  Pt still has 10/10 substernal pain.    Allergies Allergies  Allergen Reactions   Levaquin [Levofloxacin] Anaphylaxis   Ca Phosphate-Cholecalciferol Hives   Cvs Yogurt + Calcium [Ca Phosphate-Cholecalciferol] Other (See Comments)    Unknown reaction   Origanum Oil Hives   Penicillins Swelling    Level of Care/Admitting Diagnosis ED Disposition     ED Disposition  Admit   Condition  --   Comment  Hospital Area: [facility] [[id number]]  Level of Care: Telemetry Medical [104]  May admit patient to [facility] or [facility] if equivalent level of care is available:: No  Covid Evaluation: Asymptomatic - no recent exposure (last 10 days) testing not required  Diagnosis: CAP (community acquired pneumonia) [865784]  Admitting Physician: Angie Fava [[id number]]  Attending Physician: Angie Fava [[id number]]  Certification:: I certify this patient will need inpatient services for at least 2 midnights  Estimated Length of Stay: 2          B Medical/Surgery History 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   Bell's palsy    Depression    Sickle cell trait (HCC)    Sleep apnea    Past Surgical History:  Procedure Laterality Date   benign breast tumor removed      2006   BREAST BIOPSY Right 2014   benign   BREAST EXCISIONAL BIOPSY Right 2007   benign    CHOLECYSTECTOMY N/A 12/16/2018   Procedure: LAPAROSCOPIC CHOLECYSTECTOMY;  Surgeon: Berna Bue, MD;  Location: [facility] OR;  Service: General;  Laterality: N/A;   right ankle surgery     2010   TOTAL SHOULDER ARTHROPLASTY Right 06/29/2019   Procedure: TOTAL SHOULDER ARTHROPLASTY;  Surgeon: Teryl Lucy, MD;  Location: [facility] ORS;  Service: Orthopedics;  Laterality: Right;   TUBAL LIGATION       A IV Location/Drains/Wounds Patient Lines/Drains/Airways Status     Active Line/Drains/Airways     Name Placement date Placement time Site Days   Peripheral IV 02/18/23 22 G Right Antecubital 02/18/23  0858  Antecubital  137   Peripheral IV 07/05/23 20 G 1" Left Antecubital 07/05/23  0056  Antecubital  less than 1   Incision - 4 Ports Abdomen Umbilicus Right;Upper Right;Lower Left 12/16/18  1048  -- 1662            Intake/Output Last 24 hours  Intake/Output Summary (Last 24 hours) at 07/05/2023 1242 Last data filed at 07/05/2023 0102 Gross per 24 hour  Intake 50 ml  Output --  Net 50 ml    Labs/Imaging Results for orders placed or performed during the hospital encounter of 07/05/23 (from the past 48 hour(s))  Basic metabolic panel     Status: Abnormal  Collection Time: 07/05/23  1:35 AM  Result Value Ref Range   Sodium 138 135 - 145 mmol/L   Potassium 3.7 3.5 - 5.1 mmol/L   Chloride 104 98 - 111 mmol/L   CO2 23 22 - 32 mmol/L   Glucose, Bld 116 (H) 70 - 99 mg/dL    Comment: Glucose reference range applies only to samples taken after fasting for at least 8 hours.   BUN <5 (L) 6 - 20 mg/dL   Creatinine, Ser 1.61 0.44 - 1.00 mg/dL   Calcium 9.0 8.9 - 09.6 mg/dL   GFR, Estimated >04 >54 mL/min    Comment: (NOTE) Calculated using the CKD-EPI Creatinine Equation (2021)    Anion gap 11 5 - 15    Comment: Performed at [facility], [address], [place], [place] [address]  CBC      Status: Abnormal   Collection Time: 07/05/23  1:35 AM  Result Value Ref Range   WBC 13.2 (H) 4.0 - 10.5 K/uL   RBC 3.49 (L) 3.87 - 5.11 MIL/uL   Hemoglobin 10.2 (L) 12.0 - 15.0 g/dL   HCT 91.4 (L) 78.2 - 95.6 %   MCV 81.1 80.0 - 100.0 fL   MCH 29.2 26.0 - 34.0 pg   MCHC 36.0 30.0 - 36.0 g/dL   RDW 21.3 08.6 - 57.8 %   Platelets 82 (L) 150 - 400 K/uL    Comment: SPECIMEN CHECKED FOR CLOTS Immature Platelet Fraction may be clinically indicated, consider ordering this additional test [id number] REPEATED TO VERIFY PLATELET COUNT CONFIRMED BY SMEAR    nRBC 0.0 0.0 - 0.2 %    Comment: Performed at [facility], [address] [place], [place] [address]  Troponin I (High Sensitivity)     Status: None   Collection Time: 07/05/23  1:35 AM  Result Value Ref Range   Troponin I (High Sensitivity) 4 <18 ng/L    Comment: (NOTE) Elevated high sensitivity troponin I (hsTnI) values and significant  changes across serial measurements may suggest ACS but many other  chronic and acute conditions are known to elevate hsTnI results.  Refer to the "Links" section for chest pain algorithms and additional  guidance. Performed at [facility], [address] [place], [place] [address]   Brain natriuretic peptide     Status: Abnormal   Collection Time: 07/05/23  1:35 AM  Result Value Ref Range   B Natriuretic Peptide 300.7 (H) 0.0 - 100.0 pg/mL    Comment: Performed at [facility], [address] [place], [place] [address]  Lipase, blood     Status: None   Collection Time: 07/05/23  1:35 AM  Result Value Ref Range   Lipase 23 11 - 51 U/L    Comment: Performed at [facility], [address] [place], [place] [address]  Hepatic function panel     Status: None   Collection Time: 07/05/23  1:35 AM  Result Value Ref Range   Total Protein 7.1 6.5 - 8.1 g/dL   Albumin 3.5 3.5 - 5.0 g/dL   AST 16 15 - 41 U/L   ALT 12 0 - 44 U/L   Alkaline Phosphatase 89 38 - 126 U/L    Total Bilirubin 1.0 0.3 - 1.2 mg/dL   Bilirubin, Direct <3.4 0.0 - 0.2 mg/dL   Indirect Bilirubin NOT CALCULATED 0.3 - 0.9 mg/dL    Comment: Performed at [facility], [address],  [place], [place] [address]  Magnesium     Status: None   Collection Time: 07/05/23  1:35 AM  Result Value Ref Range   Magnesium 1.8 1.7 - 2.4 mg/dL    Comment: Performed at [facility], [address], [place], [place] [address]  Phosphorus     Status: None   Collection Time: 07/05/23  1:35 AM  Result Value Ref Range   Phosphorus 3.6 2.5 - 4.6 mg/dL    Comment: Performed at [facility], [address], [place], [place] [address]  Procalcitonin     Status: None   Collection Time: 07/05/23  1:35 AM  Result Value Ref Range   Procalcitonin <0.10 ng/mL    Comment:        Interpretation: PCT (Procalcitonin) <= 0.5 ng/mL: Systemic infection (sepsis) is not likely. Local bacterial infection is possible. (NOTE)       Sepsis PCT Algorithm           Lower Respiratory Tract                                      Infection PCT Algorithm    ----------------------------     ----------------------------         PCT < 0.25 ng/mL                PCT < 0.10 ng/mL          Strongly encourage             Strongly discourage   discontinuation of antibiotics    initiation of antibiotics    ----------------------------     -----------------------------       PCT 0.25 - 0.50 ng/mL            PCT 0.10 - 0.25 ng/mL               OR       >80% decrease in PCT            Discourage initiation of                                            antibiotics      Encourage discontinuation           of antibiotics    ----------------------------     -----------------------------         PCT >= 0.50 ng/mL              PCT 0.26 - 0.50 ng/mL               AND        <80% decrease in PCT             Encourage initiation of                                             antibiotics       Encourage continuation            of antibiotics    ----------------------------     -----------------------------        PCT >= 0.50 ng/mL  PCT > 0.50 ng/mL               AND         increase in PCT                  Strongly encourage                                      initiation of antibiotics    Strongly encourage escalation           of antibiotics                                     -----------------------------                                           PCT <= 0.25 ng/mL                                                 OR                                        > 80% decrease in PCT                                      Discontinue / Do not initiate                                             antibiotics  Performed at [facility], [address] [place], [place] [address]   SARS Coronavirus 2 by RT PCR (hospital order, performed in [facility] hospital lab) *cepheid single result test* Anterior Nasal Swab     Status: None   Collection Time: 07/05/23  2:15 AM   Specimen: Anterior Nasal Swab  Result Value Ref Range   SARS Coronavirus 2 by RT PCR NEGATIVE NEGATIVE    Comment: Performed at [facility], [address], [place], [place] [address]  Troponin I (High Sensitivity)     Status: None   Collection Time: 07/05/23  3:27 AM  Result Value Ref Range   Troponin I (High Sensitivity) 4 <18 ng/L    Comment: (NOTE) Elevated high sensitivity troponin I (hsTnI) values and significant  changes across serial measurements may suggest ACS but many other  chronic and acute conditions are known to elevate hsTnI results.  Refer to the "Links" section for chest pain algorithms and additional  guidance. Performed at [facility], [address] [place], [place] [address]    CT Angio Chest PE W and/or Wo Contrast  Result Date: 07/05/2023 CLINICAL DATA:  Suspected pulmonary embolism. EXAM: CT ANGIOGRAPHY CHEST WITH CONTRAST TECHNIQUE: Multidetector CT imaging of the chest was performed  using the standard protocol during bolus administration of  intravenous contrast. Multiplanar CT image reconstructions and MIPs were obtained to evaluate the vascular anatomy. RADIATION DOSE REDUCTION: This exam was performed according to the departmental dose-optimization program which includes automated exposure control, adjustment of the mA and/or kV according to patient size and/or use of iterative reconstruction technique. CONTRAST:  75mL OMNIPAQUE IOHEXOL 350 MG/ML SOLN COMPARISON:  August 08, 2022 FINDINGS: Cardiovascular: The thoracic aorta is normal in appearance. Satisfactory opacification of the pulmonary arteries to the segmental level. No evidence of pulmonary embolism. Normal heart size. No pericardial effusion. Mediastinum/Nodes: There is mild, stable bilateral hilar lymphadenopathy. Stable AP window and right paratracheal lymph nodes are also seen. Thyroid gland, trachea, and esophagus demonstrate no significant findings. Lungs/Pleura: Moderate to marked severity atelectasis and/or infiltrate is seen within the bilateral lower lobes. No pleural effusion or pneumothorax is identified. Upper Abdomen: Multiple surgical clips are seen within the gallbladder fossa. Musculoskeletal: No chest wall abnormality. No acute or significant osseous findings. Review of the MIP images confirms the above findings. IMPRESSION: 1. No evidence of pulmonary embolism. 2. Moderate to marked severity bilateral lower lobe atelectasis and/or infiltrate. 3. Stable mild bilateral hilar and mediastinal lymphadenopathy. 4. Evidence of prior cholecystectomy. Electronically Signed   By: Aram Candela M.D.   On: 07/05/2023 03:16   DG Chest 2 View  Result Date: 07/05/2023 CLINICAL DATA:  Chest pain EXAM: CHEST - 2 VIEW COMPARISON:  08/08/2022 FINDINGS: Right shoulder replacement. Chronic elevation left diaphragm. Mild cardiomegaly with vascular congestion and diffuse interstitial process suggestive of minimal edema.  Heterogeneous focal disease at the left base. No pneumothorax IMPRESSION: 1. Cardiomegaly with vascular congestion and mild interstitial edema. 2. Focal heterogeneous disease at the left base may reflect atelectasis or pneumonia. Electronically Signed   By: Jasmine Pang M.D.   On: 07/05/2023 02:05    Pending Labs Unresulted Labs (From admission, onward)     Start     Ordered   07/05/23 1007  Hgb Fractionation Cascade  Add-on,   AD        07/05/23 1006   07/05/23 0935  Angiotensin converting enzyme  Add-on,   AD        07/05/23 0934   07/05/23 0934  C-reactive protein  Add-on,   AD        07/05/23 0933   07/05/23 0934  Sedimentation rate  Add-on,   AD        07/05/23 0933   07/05/23 0759  Respiratory (~20 pathogens) panel by PCR  (Respiratory panel by PCR (~20 pathogens, ~24 hr TAT)  w precautions)  Add-on,   AD        07/05/23 0758   07/05/23 0600  Mycoplasma pneumoniae antibody, IgM  Once,   R        07/05/23 0600   07/05/23 0503  Legionella Pneumophila Serogp 1 Ur Ag  Add-on,   AD        07/05/23 0502   07/05/23 0503  Strep pneumoniae urinary antigen  Add-on,   AD        07/05/23 0502            Vitals/Pain Today's Vitals   07/05/23 0700 07/05/23 0845 07/05/23 0930 07/05/23 1200  BP: 118/60 (!) 173/74  (!) 159/86  Pulse: 72 100  77  Resp: (!) 26 (!) 21  (!) 22  Temp:      TempSrc:      SpO2: 100% 100%  100%  Weight:      Height:  PainSc:   Asleep     Isolation Precautions Droplet precaution  Medications Medications  acetaminophen (TYLENOL) tablet 650 mg (has no administration in time range)    Or  acetaminophen (TYLENOL) suppository 650 mg (has no administration in time range)  melatonin tablet 3 mg (has no administration in time range)  ondansetron (ZOFRAN) injection 4 mg (has no administration in time range)  naloxone (NARCAN) injection 0.4 mg (has no administration in time range)  doxycycline (VIBRAMYCIN) 100 mg in sodium chloride 0.9 % 250 mL IVPB (0  mg Intravenous Stopped 07/05/23 0847)  benzonatate (TESSALON) capsule 200 mg (has no administration in time range)  ARIPiprazole (ABILIFY) tablet 10 mg (has no administration in time range)  ferrous sulfate tablet 325 mg (325 mg Oral Given 07/05/23 0902)  sertraline (ZOLOFT) tablet 100 mg (100 mg Oral Given 07/05/23 0901)  nicotine (NICODERM CQ - dosed in mg/24 hours) patch 14 mg (has no administration in time range)  cefTRIAXone (ROCEPHIN) 2 g in sodium chloride 0.9 % 100 mL IVPB (0 g Intravenous Stopped 07/05/23 1001)  methylPREDNISolone sodium succinate (SOLU-MEDROL) 125 mg/2 mL injection 80 mg (80 mg Intravenous Given 07/05/23 0901)  oxyCODONE (Oxy IR/ROXICODONE) immediate release tablet 5 mg (has no administration in time range)  morphine (PF) 4 MG/ML injection 4 mg (4 mg Intravenous Given 07/05/23 0900)  morphine (PF) 4 MG/ML injection 4 mg (4 mg Intravenous Given 07/05/23 0245)  alum & mag hydroxide-simeth (MAALOX/MYLANTA) 200-200-20 MG/5ML suspension 30 mL (30 mLs Oral Given 07/05/23 0243)  famotidine (PEPCID) IVPB 20 mg premix (0 mg Intravenous Stopped 07/05/23 0338)  iohexol (OMNIPAQUE) 350 MG/ML injection 75 mL (75 mLs Intravenous Contrast Given 07/05/23 0307)  aztreonam (AZACTAM) 2 g in sodium chloride 0.9 % 100 mL IVPB (0 g Intravenous Stopped 07/05/23 0428)  vancomycin (VANCOREADY) IVPB 1500 mg/300 mL (0 mg Intravenous Stopped 07/05/23 0635)  fentaNYL (SUBLIMAZE) injection 25 mcg (25 mcg Intravenous Given 07/05/23 0657)    Mobility walks with person assist     Focused Assessments Pulmonary Assessment Handoff:  Lung sounds:   O2 Device: Room Air      R Recommendations: See Admitting Provider Note  Report given to:   Additional Notes:

## 2023-07-05 NOTE — H&P (Signed)
History and Physical      Tina Mayo ZSW:[id number] DOB: 1971-11-29 DOA: 07/05/2023; DOS: 07/05/2023  PCP: Pcp, No  (will further assess) Patient coming from: home   I have personally briefly reviewed patient's old medical records in [facility]  Chief Complaint: Chest pain  HPI: Tina Mayo is a 52 y.o. female with medical history significant for chronic iron deficiency anemia, chronic thrombocytopenia, who is admitted to [facility] on 07/05/2023 with community-acquired pneumonia after presenting from home to [facility] ED complaining of chest pain.   The patient reports 2 days of progressive chest pain associated with the inferior aspects of the bilateral anterior chest, worse with inspiration as well as with cough, noting new onset nonproductive cough over a similar timeframe.  She reports subjective fever in the absence of any chills, rigors, or generalized myalgias.  Her chest pain has been nonexertional, nonpositional, and not reproducible after palpation over the anterior chest wall.  No hemoptysis.  Denies any recent new lower extremity erythema or calf tenderness.  No recent trauma.  Denies any recent dysuria or gross hematuria.  No rash, nor any recent headache or neck stiffness.    ED Course:  Vital signs in the ED were notable for the following: Temperature max 99.2; heart rates in the 70s to 80s; systolic blood pressure in the 120s; respiratory rate 18-19, oxygen saturation 100% on room air.  Labs were notable for the following: CMP notable for the following: Sodium 130, Cardide 23, creatinine 0.75, liver enzymes within normal limits.  Lipase 23.  BNP 300, without any prior BMP data point available for comparison.  High sensitive troponin I x 2 values are within before.  CBC notable for will with cell count 13,200, hemoglobin 10.2 associated normocytic/recurrent properties as well as nonelevated RDW and relative doses and prior hemoglobin data point of 11.3  on 1-24, platelet count 82 relative to 106 in January 2024.  Per my interpretation, EKG in ED demonstrated the following: Sinus rhythm with sinus arrhythmia, heart rate 83, normal intervals, no evidence of T wave or ST changes, Cleen evidence of ST elevation.  Imaging in the ED, per corresponding formal radiology read, was notable for the following: CTA chest showed no evidence of pulmonary embolism while demonstrating evidence of bilateral lower lobe infiltrates in the absence of any evidence of edema, pleural effusion, or pneumothorax.  While in the ED, the following were administered: IV vancomycin, aztreonam, Maalox, Mylanta, morphine 4 mg IV x 1, Pepcid 20 mg IV x 1.  Subsequently, the patient was admitted for further evaluation management of presenting community-acquired pneumonia in the setting of atypical chest discomfort.     Review of Systems: As per HPI otherwise 10 point review of systems negative.   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   Bell's palsy    Depression    Sickle cell trait (HCC)    Sleep apnea     Past Surgical History:  Procedure Laterality Date   benign breast tumor removed     2006   BREAST BIOPSY Right 2014   benign   BREAST EXCISIONAL BIOPSY Right 2007   benign    CHOLECYSTECTOMY N/A 12/16/2018   Procedure: LAPAROSCOPIC CHOLECYSTECTOMY;  Surgeon: Berna Bue, MD;  Location: [facility] OR;  Service: General;  Laterality: N/A;   right ankle surgery     2010   TOTAL SHOULDER  ARTHROPLASTY Right 06/29/2019   Procedure: TOTAL SHOULDER ARTHROPLASTY;  Surgeon: Teryl Lucy, MD;  Location: [facility] ORS;  Service: Orthopedics;  Laterality: Right;   TUBAL LIGATION      Social History:  reports that she has been smoking cigarettes. She uses smokeless tobacco. She reports that she does not drink alcohol and does not use drugs.   Allergies  Allergen Reactions   Levaquin [Levofloxacin  In D5w] Anaphylaxis   Ca Phosphate-Cholecalciferol Hives   Cvs Yogurt + Calcium [Ca Phosphate-Cholecalciferol]    Origanum Oil Hives   Penicillins Swelling    DID THE REACTION INVOLVE: Swelling of the face/tongue/throat, SOB, or low BP? Yes Sudden or severe rash/hives, skin peeling, or the inside of the mouth or nose? No Did it require medical treatment? Yes When did it last happen?   8 years ago    If all above answers are "NO", may proceed with cephalosporin use.     Family History  Problem Relation Age of Onset   Breast cancer Mother    Cancer Mother    Diabetes Mellitus II Father    Diabetes Father    Esophageal cancer Maternal Grandmother    Breast cancer Maternal Grandmother    Colon cancer Neg Hx    Colon polyps Neg Hx    Rectal cancer Neg Hx    Stomach cancer Neg Hx     Family history reviewed and not pertinent    Prior to Admission medications   Medication Sig Start Date End Date Taking? Authorizing Provider  acetaminophen (TYLENOL) 500 MG tablet Take 2 tablets (1,000 mg total) by mouth every 8 (eight) hours as needed. Patient not taking: Reported on 01/28/2023 03/29/20   Belinda Fisher, PA-C  ARIPiprazole (ABILIFY) 10 MG tablet Take 1 tablet (10 mg total) by mouth at bedtime. 10/28/22   Mardella Layman, MD  doxycycline (VIBRAMYCIN) 100 MG capsule Take 1 capsule (100 mg total) by mouth 2 (two) times daily. Patient not taking: Reported on 01/28/2023 08/06/22   Tomi Bamberger, PA-C  FEROSUL 325 (65 Fe) MG tablet Take 325 mg by mouth daily. 12/31/22   [provider]  ibuprofen (ADVIL) 800 MG tablet Take 1 tablet (800 mg total) by mouth 3 (three) times daily with meals. 10/28/22   Mardella Layman, MD  Lidocaine 4 % PTCH Place 1 patch onto the skin daily as needed (pain). Patient not taking: Reported on 01/28/2023    [provider]  Menthol-Methyl Salicylate (MUSCLE RUB) 10-15 % CREA Apply 1 application topically as needed for muscle pain.    [provider]  methocarbamol (ROBAXIN) 500 MG tablet Take 1 tablet (500 mg total) by mouth 2 (two) times daily. Patient not taking: Reported on 01/28/2023 10/28/22   Mardella Layman, MD  oxyCODONE (ROXICODONE) 5 MG immediate release tablet Take 1 tablet (5 mg total) by mouth every 4 (four) hours as needed for severe pain. Patient not taking: Reported on 01/28/2023 08/08/22   Dione Booze, MD  sertraline (ZOLOFT) 50 MG tablet Take 1 tablet (50 mg total) by mouth at bedtime. 10/28/22   Mardella Layman, MD  traZODone (DESYREL) 100 MG tablet Take 100 mg by mouth at bedtime. 08/17/20   [provider]     Objective    Physical Exam: Vitals:   07/05/23 0128 07/05/23 0134 07/05/23 0205  BP:  122/77 127/70  Pulse:  74 82  Resp:  18 19  Temp:  99.2 F (37.3 C)   SpO2:  100% 100%  Weight: 95.3 kg    Height: 5\' 7"  (1.702 m)      General: appears to be stated age; alert, oriented Skin: warm, dry, no rash Head:  AT/ Mouth:  Oral mucosa membranes appear moist, normal dentition Neck: supple; trachea midline Heart:  RRR; did not appreciate any M/R/G Lungs: CTAB, did not appreciate any wheezes, rales, or rhonchi Abdomen: + BS; soft, ND, NT Vascular: 2+ pedal pulses b/l; 2+ radial pulses b/l Extremities: no peripheral edema, no muscle wasting Neuro: strength and sensation intact in upper and lower extremities b/l     Labs on Admission: I have personally reviewed following labs and imaging studies  CBC: Recent Labs  Lab 07/05/23 0135  WBC 13.2*  HGB 10.2*  HCT 28.3*  MCV 81.1  PLT 82*   Basic Metabolic Panel: Recent Labs  Lab 07/05/23 0135  NA 138  K 3.7  CL 104  CO2 23  GLUCOSE 116*  BUN <5*  CREATININE 0.75  CALCIUM 9.0  MG 1.8  PHOS 3.6   GFR: Estimated Creatinine Clearance: 96.4 mL/min (by C-G formula based on SCr of 0.75 mg/dL). Liver Function Tests: Recent Labs  Lab 07/05/23 0135  AST 16  ALT 12  ALKPHOS 89  BILITOT 1.0  PROT 7.1  ALBUMIN 3.5   Recent Labs   Lab 07/05/23 0135  LIPASE 23   No results for input(s): "AMMONIA" in the last 168 hours. Coagulation Profile: No results for input(s): "INR", "PROTIME" in the last 168 hours. Cardiac Enzymes: No results for input(s): "CKTOTAL", "CKMB", "CKMBINDEX", "TROPONINI" in the last 168 hours. BNP (last 3 results) No results for input(s): "PROBNP" in the last 8760 hours. HbA1C: No results for input(s): "HGBA1C" in the last 72 hours. CBG: No results for input(s): "GLUCAP" in the last 168 hours. Lipid Profile: No results for input(s): "CHOL", "HDL", "LDLCALC", "TRIG", "CHOLHDL", "LDLDIRECT" in the last 72 hours. Thyroid Function Tests: No results for input(s): "TSH", "T4TOTAL", "FREET4", "T3FREE", "THYROIDAB" in the last 72 hours. Anemia Panel: No results for input(s): "VITAMINB12", "FOLATE", "FERRITIN", "TIBC", "IRON", "RETICCTPCT" in the last 72 hours. Urine analysis:    Component Value Date/Time   COLORURINE AMBER (A) 12/14/2018 1755   APPEARANCEUR HAZY (A) 12/14/2018 1755   LABSPEC 1.018 12/14/2018 1755   PHURINE 6.0 12/14/2018 1755   GLUCOSEU NEGATIVE 12/14/2018 1755   HGBUR NEGATIVE 12/14/2018 1755   BILIRUBINUR MODERATE (A) 12/14/2018 1755   KETONESUR 5 (A) 12/14/2018 1755   PROTEINUR 30 (A) 12/14/2018 1755   UROBILINOGEN 1.0 01/30/2015 1110   NITRITE NEGATIVE 12/14/2018 1755   LEUKOCYTESUR NEGATIVE 12/14/2018 1755    Radiological Exams on Admission: CT Angio Chest PE W and/or Wo Contrast  Result Date: 07/05/2023 CLINICAL DATA:  Suspected pulmonary embolism. EXAM: CT ANGIOGRAPHY CHEST WITH CONTRAST TECHNIQUE: Multidetector CT imaging of the chest was performed using the standard protocol during bolus administration of intravenous contrast. Multiplanar CT image reconstructions and MIPs were obtained to evaluate the vascular anatomy. RADIATION DOSE REDUCTION: This exam was performed according to the departmental dose-optimization program which includes automated exposure control,  adjustment of the mA and/or kV according to patient size and/or use of iterative reconstruction technique. CONTRAST:  75mL OMNIPAQUE IOHEXOL 350 MG/ML SOLN COMPARISON:  August 08, 2022 FINDINGS: Cardiovascular: The thoracic aorta is normal in appearance. Satisfactory opacification of the pulmonary arteries to the segmental level. No evidence of pulmonary embolism. Normal heart size. No pericardial effusion. Mediastinum/Nodes: There is mild, stable bilateral hilar lymphadenopathy. Stable AP window and right paratracheal lymph  nodes are also seen. Thyroid gland, trachea, and esophagus demonstrate no significant findings. Lungs/Pleura: Moderate to marked severity atelectasis and/or infiltrate is seen within the bilateral lower lobes. No pleural effusion or pneumothorax is identified. Upper Abdomen: Multiple surgical clips are seen within the gallbladder fossa. Musculoskeletal: No chest wall abnormality. No acute or significant osseous findings. Review of the MIP images confirms the above findings. IMPRESSION: 1. No evidence of pulmonary embolism. 2. Moderate to marked severity bilateral lower lobe atelectasis and/or infiltrate. 3. Stable mild bilateral hilar and mediastinal lymphadenopathy. 4. Evidence of prior cholecystectomy. Electronically Signed   By: Aram Candela M.D.   On: 07/05/2023 03:16   DG Chest 2 View  Result Date: 07/05/2023 CLINICAL DATA:  Chest pain EXAM: CHEST - 2 VIEW COMPARISON:  08/08/2022 FINDINGS: Right shoulder replacement. Chronic elevation left diaphragm. Mild cardiomegaly with vascular congestion and diffuse interstitial process suggestive of minimal edema. Heterogeneous focal disease at the left base. No pneumothorax IMPRESSION: 1. Cardiomegaly with vascular congestion and mild interstitial edema. 2. Focal heterogeneous disease at the left base may reflect atelectasis or pneumonia. Electronically Signed   By: Jasmine Pang M.D.   On: 07/05/2023 02:05      Assessment/Plan    Principal Problem:   CAP (community acquired pneumonia) Active Problems:   Atypical chest pain   Thrombocytopenia (HCC)   Depression   Tobacco abuse   GAD (generalized anxiety disorder)   Chronic iron deficiency anemia      #) Community-acquired pneumonia: Diagnosis on the basis of 2 days of new onset cough associated with pleuritic chest discomfort, subjective fever, with CTA chest showing evidence of bilateral lower lobe infiltrates.  Presentation is notable for leukocytosis.  However, in the absence of any additional SIRS criteria, criteria not currently met for sepsis.  She appears hemodynamically stable.  She has reported allergies to Levaquin as well as penicillin.  However, per my discussions with our inpatient pharmacist, the patient has previously tolerated Ancef, Keflex, and cefepime, without complication.  Consequently, will pursue Rocephin as well as doxycycline for coverage of community-acquired pneumonia.  Plan: Doxycycline and Rocephin, as above.  Add on procalcitonin level.  Incentive spirometry.  Flutter valve.  Check strep pneumonia urine antigen, mycoplasma antibodies, Legionella urine antigen.  Repeat CMP, CBC with differential in the morning.  Check serum magnesium and phosphorus levels.  Prn Tessalon Perles.  Prn acetaminophen for fever.              #) Atypical chest pain: Limited duties of pleuritic chest discomfort associated with the inferior aspects of the bilateral anterior chest, consistent in anatomical distribution with the infiltrates observed on today CTA chest.  Nonexertional.  Appears atypical for ACS, including frequent notes to evaluate nonelevated and flat, while EKG shows no evidence of acute ischemic changes.  Additionally, CT chest is without any evidence of acute pulmonary embolism or any evidence of pneumothorax.  Lipase nonelevated.  Plan: Further evaluation management to have presenting community-acquired pneumonia, as further  detailed above.  Flutter valve.  And substructure.  Prn IV fentanyl.  Repeat CBC in the morning.  Prn acetaminophen.                 #) GAD/depression: On Abilify as well as Zoloft as an outpatient.  Per review of presenting EKG, no evidence of QTc prolongation.  Plan: Continue outpatient Zoloft as well as Abilify.               #) Chronic iron deficiency anemia: Documented  history of such, a/w with baseline hgb range 10-12, with presenting hgb consistent with this range, in the absence of any overt evidence of active bleed.  On daily oral iron supplementation as an outpatient.   Plan: Repeat CBC in the morning.  Continue outpatient daily oral iron supplementation.                   #) Chronic tobacco abuse: Patient conveys that they are a current smoker, having smoked half ppd for at least the last 10 years.   Plan: Counseled the patient for less than 2 minutes on the importance of complete smoking discontinuation.  Order placed for prn nicotine patch for use during this hospitalization.                   #) Chronic thrombocytopenia: Documented history of such, with chronic thrombocytopenia noted to be present dating back to at least 2015, with presenting platelet count consistent with baseline range over that timeframe.  No evidence of active bleed.  Etiology not entirely clear to me at this time.  Plan: Repeat CBC in the morning.      DVT prophylaxis: SCD's   Code Status: Full code Family Communication: none Disposition Plan: Per Rounding Team Consults called: none;  Admission status: inpatient     I SPENT GREATER THAN 75  MINUTES IN CLINICAL CARE TIME/MEDICAL DECISION-MAKING IN COMPLETING THIS ADMISSION.      Chaney Born Christabel Camire DO [facility]  From 7PM - 7AM   07/05/2023, 5:22 AM

## 2023-07-06 DIAGNOSIS — R079 Chest pain, unspecified: Secondary | ICD-10-CM | POA: Diagnosis not present

## 2023-07-06 LAB — PHOSPHORUS: Phosphorus: 2.8 mg/dL (ref 2.5–4.6)

## 2023-07-06 LAB — COMPREHENSIVE METABOLIC PANEL WITH GFR
ALT: 15 U/L (ref 0–44)
AST: 19 U/L (ref 15–41)
Albumin: 3.4 g/dL — ABNORMAL LOW (ref 3.5–5.0)
Alkaline Phosphatase: 111 U/L (ref 38–126)
Anion gap: 9 (ref 5–15)
BUN: 5 mg/dL — ABNORMAL LOW (ref 6–20)
CO2: 22 mmol/L (ref 22–32)
Calcium: 8.8 mg/dL — ABNORMAL LOW (ref 8.9–10.3)
Chloride: 104 mmol/L (ref 98–111)
Creatinine, Ser: 0.8 mg/dL (ref 0.44–1.00)
GFR, Estimated: >60 mL/min (ref >60)
Glucose, Bld: 99 mg/dL (ref 70–99)
Potassium: 3.5 mmol/L (ref 3.5–5.1)
Sodium: 135 mmol/L (ref 135–145)
Total Bilirubin: 2.2 mg/dL — ABNORMAL HIGH (ref 0.3–1.2)
Total Protein: 6.7 g/dL (ref 6.5–8.1)

## 2023-07-06 LAB — CBC WITH DIFFERENTIAL/PLATELET
Abs Immature Granulocytes: 0.13 10*3/uL — ABNORMAL HIGH (ref 0.00–0.07)
Basophils Absolute: 0 10*3/uL (ref 0.0–0.1)
Basophils Relative: 0 %
Eosinophils Absolute: 0 10*3/uL (ref 0.0–0.5)
Eosinophils Relative: 0 %
HCT: 24.5 % — ABNORMAL LOW (ref 36.0–46.0)
Hemoglobin: 8.9 g/dL — ABNORMAL LOW (ref 12.0–15.0)
Immature Granulocytes: 1 %
Lymphocytes Relative: 9 %
Lymphs Abs: 1.1 10*3/uL (ref 0.7–4.0)
MCH: 28.3 pg (ref 26.0–34.0)
MCHC: 36.3 g/dL — ABNORMAL HIGH (ref 30.0–36.0)
MCV: 78 fL — ABNORMAL LOW (ref 80.0–100.0)
Monocytes Absolute: 0.9 10*3/uL (ref 0.1–1.0)
Monocytes Relative: 8 %
Neutro Abs: 10.2 10*3/uL — ABNORMAL HIGH (ref 1.7–7.7)
Neutrophils Relative %: 82 %
Platelets: 61 10*3/uL — ABNORMAL LOW (ref 150–400)
RBC: 3.14 MIL/uL — ABNORMAL LOW (ref 3.87–5.11)
RDW: 14.9 % (ref 11.5–15.5)
WBC: 12.4 10*3/uL — ABNORMAL HIGH (ref 4.0–10.5)
nRBC: 0.6 % — ABNORMAL HIGH (ref 0.0–0.2)

## 2023-07-06 LAB — STREP PNEUMONIAE URINARY ANTIGEN: Strep Pneumo Urinary Antigen: NEGATIVE

## 2023-07-06 LAB — CK: Total CK: 229 U/L (ref 38–234)

## 2023-07-06 LAB — MAGNESIUM: Magnesium: 1.8 mg/dL (ref 1.7–2.4)

## 2023-07-06 MED ORDER — BISACODYL 10 MG RE SUPP
10.0000 mg | Freq: Once | RECTAL | Status: AC
  Administered 2023-07-06: 10 mg via RECTAL
  Filled 2023-07-06: qty 1

## 2023-07-06 NOTE — Plan of Care (Signed)

## 2023-07-06 NOTE — Progress Notes (Signed)
PROGRESS NOTE    Tina Mayo  ZOX:[id number] DOB: [birth year] DOA: 07/05/2023 PCP: Pcp, No  Chief Complaint  Patient presents with   Chest Pain    Brief Narrative:   Tina Mayo is Tina Mayo 52 y.o. female with medical history significant for chronic iron deficiency anemia, chronic thrombocytopenia, who is admitted to [facility] on 07/05/2023 with community-acquired pneumonia after presenting from home to [facility] ED complaining of chest pain.   Assessment & Plan:   Principal Problem:   CAP (community acquired pneumonia) Active Problems:   Chest pain   Thrombocytopenia (HCC)   Depression   Tobacco abuse   GAD (generalized anxiety disorder)   Chronic iron deficiency anemia  Atypical Chest Pain Treating for pneumonia as noted below, but whole picture not quite coming together as chest pain seems out of proportion Troponins negative x2 reassuringly.  CT PE protocol negative for PE. BNP elevated, will follow echo (EF 60-65%, no RWMA)   Bilateral Lower Lobe Atelectasis vs Infiltrate Presumed Community Acquired Pneumonia Currently being treated for CAP, but no fever, chills - she does have mild leukocytosis Continue ceftriaxone, doxycycline Will add on steroids - overall improved with steroids Follow CRP, ESR (both only mildly elevated) I think pulmonary follow up is warranted outpatient  I'm not sure pneumonia completely explains her presentation.  Has positive anti CCP consistent with rheumatoid, not on treatment.  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  Could these or other causes be more likely contributors?   Rheumatoid Arthritis Not on any DMARDS Not clear it me if she knows of this diagnosis Anti CCP positive from 05/25/2021 (negative RF, negative ANA at that time) Steroids as above, will need rheum follow up outpatient  Myalgias Related to above?  Follow CK   Mild Bilateral Hilar and Mediastinal Lymphadenopathy Some  chronicity to this Needs outpatient follow up Follow ACE test ? 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 I see positive sickle cell screen, but no hb electrophoresis - she tells me she has trait, though I'd think avascular necrosis would not be common with trait alone? Follow hemoglobin electrophoresis  Obesity Body mass index is 32.89 kg/m.    DVT prophylaxis: SCD Code Status: full Family Communication: none Disposition:   Status is: Inpatient Remains inpatient appropriate because: none   Consultants:  none  Procedures:  Echo IMPRESSIONS     1. Left ventricular ejection fraction, by estimation, is 60 to 65%. The  left ventricle has normal function. The left ventricle has no regional  wall motion abnormalities. Left ventricular diastolic parameters were  normal.   2. Right ventricular systolic function is normal. The right ventricular  size is normal. There is normal pulmonary artery systolic pressure.   3. The mitral valve is normal in structure. No evidence of mitral valve  regurgitation.   4. The aortic valve is normal in structure. Aortic valve regurgitation is  not visualized.   5. The inferior vena cava is normal in size with greater than 50%  respiratory variability, suggesting right atrial pressure of 3 mmHg.   Conclusion(s)/Recommendation(s): Normal biventricular function without  evidence of hemodynamically significant valvular heart disease.   Antimicrobials:  Anti-infectives (From admission, onward)    Start  Dose/Rate Route Frequency Ordered Stop   07/05/23 1000  cefTRIAXone (ROCEPHIN) 2 g in sodium chloride 0.9 % 100 mL IVPB        2 g 200 mL/hr over 30 Minutes Intravenous  Every 24 hours 07/05/23 0514     07/05/23 0600  doxycycline (VIBRAMYCIN) 100 mg in sodium chloride 0.9 % 250 mL IVPB        100 mg 125 mL/hr over 120 Minutes Intravenous 2 times daily 07/05/23 0500     07/05/23 0345  vancomycin (VANCOREADY) IVPB 1500 mg/300 mL        1,500 mg 150 mL/hr over 120 Minutes Intravenous  Once 07/05/23 0330 07/05/23 0635   07/05/23 0330  aztreonam (AZACTAM) 2 g in sodium chloride 0.9 % 100 mL IVPB        2 g 200 mL/hr over 30 Minutes Intravenous  Once 07/05/23 0322 07/05/23 0428       Subjective: Complaining of soreness  Objective: Vitals:   07/05/23 2203 07/06/23 0401 07/06/23 0855 07/06/23 1552  BP: (!) 110/57 114/60 110/65 104/75  Pulse: 89 94 100 100  Resp: 16 16 18    Temp: 98.9 F (37.2 C) (!) 100.8 F (38.2 C) 98.9 F (37.2 C) 98.9 F (37.2 C)  TempSrc:  Oral  Oral  SpO2: 92% 94% 96% 97%  Weight:      Height:        Intake/Output Summary (Last 24 hours) at 07/06/2023 1752 Last data filed at 07/06/2023 1640 Gross per 24 hour  Intake 100 ml  Output 300 ml  Net -200 ml   Filed Weights   07/05/23 0128  Weight: 95.3 kg    Examination:  General: No acute distress. Cardiovascular: RRR Lungs: unlabored Abdomen: Soft, nontender, nondistended  Neurological: Alert and oriented 3. Moves all extremities 4 with equal strength. Cranial nerves II through XII grossly intact. Extremities: No clubbing or cyanosis. No edema.   Data Reviewed: I have personally reviewed following labs and imaging studies  CBC: Recent Labs  Lab 07/05/23 0135 07/06/23 0053  WBC 13.2* 12.4*  NEUTROABS  --  10.2*  HGB 10.2* 8.9*  HCT 28.3* 24.5*  MCV 81.1 78.0*  PLT 82* 61*    Basic Metabolic Panel: Recent Labs  Lab 07/05/23 0135 07/06/23 0053  NA 138 135  K 3.7 3.5  CL 104 104  CO2 23 22  GLUCOSE 116* 99  BUN <5* 5*  CREATININE 0.75 0.80  CALCIUM 9.0 8.8*  MG 1.8 1.8  PHOS 3.6 2.8    GFR: Estimated Creatinine Clearance: 96.4 mL/min (by  C-G formula based on SCr of 0.8 mg/dL).  Liver Function Tests: Recent Labs  Lab 07/05/23 0135 07/06/23 0053  AST 16 19  ALT 12 15  ALKPHOS 89 111  BILITOT 1.0 2.2*  PROT 7.1 6.7  ALBUMIN 3.5 3.4*    CBG: No results for input(s): "GLUCAP" in the last 168 hours.   Recent Results (from the past 240 hour(s))  SARS Coronavirus 2 by RT PCR (hospital order, performed in [facility] hospital lab) *cepheid single result test* Anterior Nasal Swab     Status: None   Collection Time: 07/05/23  2:15 AM   Specimen: Anterior Nasal Swab  Result Value Ref Range Status   SARS Coronavirus 2 by RT PCR NEGATIVE NEGATIVE Final    Comment: Performed at [facility], [address] [place], [place] [address]  Respiratory (~20 pathogens) panel by PCR     Status: None  Collection Time: 07/05/23  2:15 AM   Specimen: Nasopharyngeal Swab; Respiratory  Result Value Ref Range Status   Adenovirus NOT DETECTED NOT DETECTED Final   Coronavirus 229E NOT DETECTED NOT DETECTED Final    Comment: (NOTE) The Coronavirus on the Respiratory Panel, DOES NOT test for the novel  Coronavirus (2019 nCoV)    Coronavirus HKU1 NOT DETECTED NOT DETECTED Final   Coronavirus NL63 NOT DETECTED NOT DETECTED Final   Coronavirus OC43 NOT DETECTED NOT DETECTED Final   Metapneumovirus NOT DETECTED NOT DETECTED Final   Rhinovirus / Enterovirus NOT DETECTED NOT DETECTED Final   Influenza Yatzil Clippinger NOT DETECTED NOT DETECTED Final   Influenza B NOT DETECTED NOT DETECTED Final   Parainfluenza Virus 1 NOT DETECTED NOT DETECTED Final   Parainfluenza Virus 2 NOT DETECTED NOT DETECTED Final   Parainfluenza Virus 3 NOT DETECTED NOT DETECTED Final   Parainfluenza Virus 4 NOT DETECTED NOT DETECTED Final   Respiratory Syncytial Virus NOT DETECTED NOT DETECTED Final   Bordetella pertussis NOT DETECTED NOT DETECTED Final   Bordetella Parapertussis NOT DETECTED NOT DETECTED Final   Chlamydophila pneumoniae NOT DETECTED NOT DETECTED Final    Mycoplasma pneumoniae NOT DETECTED NOT DETECTED Final    Comment: Performed at [facility], [address], [place], [place] [address]         Radiology Studies: ECHOCARDIOGRAM COMPLETE  Result Date: 07/05/2023    ECHOCARDIOGRAM REPORT   Patient Name:   CALI KIPPEN Date of Exam: 07/05/2023 Medical Rec #:  [id number]             Height:       67.0 in Accession #:    [id number]            Weight:       210.0 lb Date of Birth:  December 20, 1970              BSA:          2.065 m Patient Age:    52 years              BP:           159/86 mmHg Patient Gender: F                     HR:           68 bpm. Exam Location:  Inpatient Procedure: 2D Echo, Color Doppler and Cardiac Doppler Indications:    chest pain  History:        Patient has prior history of Echocardiogram examinations, most                 recent 08/26/2013. Pneumonia, Signs/Symptoms:Chest Pain; Risk                 Factors:Current Smoker.  Sonographer:    Delcie Roch RDCS Referring Phys: [id number] Woodrow Dulski CALDWELL POWELL JR IMPRESSIONS  1. Left ventricular ejection fraction, by estimation, is 60 to 65%. The left ventricle has normal function. The left ventricle has no regional wall motion abnormalities. Left ventricular diastolic parameters were normal.  2. Right ventricular systolic function is normal. The right ventricular size is normal. There is normal pulmonary artery systolic pressure.  3. The mitral valve is normal in structure. No evidence of mitral valve regurgitation.  4. The aortic valve is normal in structure. Aortic valve regurgitation is not visualized.  5. The inferior vena cava is normal in size with greater than 50% respiratory variability,  suggesting right atrial pressure of 3 mmHg. Conclusion(s)/Recommendation(s): Normal biventricular function without evidence of hemodynamically significant valvular heart disease. FINDINGS  Left Ventricle: Left ventricular ejection fraction, by estimation, is 60 to 65%. The left ventricle  has normal function. The left ventricle has no regional wall motion abnormalities. The left ventricular internal cavity size was normal in size. There is  no left ventricular hypertrophy. Left ventricular diastolic parameters were normal. Right Ventricle: The right ventricular size is normal. Right ventricular systolic function is normal. There is normal pulmonary artery systolic pressure. The tricuspid regurgitant velocity is 2.80 m/s, and with an assumed right atrial pressure of 3 mmHg,  the estimated right ventricular systolic pressure is 34.4 mmHg. Left Atrium: Left atrial size was normal in size. Right Atrium: Right atrial size was normal in size. Pericardium: There is no evidence of pericardial effusion. Mitral Valve: The mitral valve is normal in structure. No evidence of mitral valve regurgitation. Tricuspid Valve: Tricuspid valve regurgitation is mild. Aortic Valve: The aortic valve is normal in structure. Aortic valve regurgitation is not visualized. Pulmonic Valve: The pulmonic valve was not well visualized. Pulmonic valve regurgitation is not visualized. Aorta: The aortic root and ascending aorta are structurally normal, with no evidence of dilitation. Venous: The inferior vena cava is normal in size with greater than 50% respiratory variability, suggesting right atrial pressure of 3 mmHg. IAS/Shunts: No atrial level shunt detected by color flow Doppler.  LEFT VENTRICLE PLAX 2D LVIDd:         4.40 cm   Diastology LVIDs:         2.80 cm   LV e' medial:    10.00 cm/s LV PW:         0.80 cm   LV E/e' medial:  11.6 LV IVS:        0.90 cm   LV e' lateral:   10.20 cm/s LVOT diam:     1.90 cm   LV E/e' lateral: 11.4 LV SV:         78 LV SV Index:   38 LVOT Area:     2.84 cm  RIGHT VENTRICLE             IVC RV Basal diam:  2.60 cm     IVC diam: 1.30 cm RV S prime:     14.90 cm/s TAPSE (M-mode): 2.5 cm LEFT ATRIUM           Index        RIGHT ATRIUM           Index LA diam:      2.90 cm 1.40 cm/m   RA Area:      12.50 cm LA Vol (A4C): 42.6 ml 20.63 ml/m  RA Volume:   26.90 ml  13.03 ml/m  AORTIC VALVE LVOT Vmax:   139.00 cm/s LVOT Vmean:  89.300 cm/s LVOT VTI:    0.275 m  AORTA Ao Root diam: 3.00 cm Ao Asc diam:  2.50 cm MITRAL VALVE                TRICUSPID VALVE MV Area (PHT): 3.37 cm     TR Peak grad:   31.4 mmHg MV Decel Time: 225 msec     TR Vmax:        280.00 cm/s MV E velocity: 116.00 cm/s MV Zeena Starkel velocity: 91.30 cm/s   SHUNTS MV E/Quentina Fronek ratio:  1.27         Systemic VTI:  0.28 m  Systemic Diam: 1.90 cm Carolan Clines Electronically signed by Carolan Clines Signature Date/Time: 07/05/2023/4:01:07 PM    Final    CT Angio Chest PE W and/or Wo Contrast  Result Date: 07/05/2023 CLINICAL DATA:  Suspected pulmonary embolism. EXAM: CT ANGIOGRAPHY CHEST WITH CONTRAST TECHNIQUE: Multidetector CT imaging of the chest was performed using the standard protocol during bolus administration of intravenous contrast. Multiplanar CT image reconstructions and MIPs were obtained to evaluate the vascular anatomy. RADIATION DOSE REDUCTION: This exam was performed according to the departmental dose-optimization program which includes automated exposure control, adjustment of the mA and/or kV according to patient size and/or use of iterative reconstruction technique. CONTRAST:  75mL OMNIPAQUE IOHEXOL 350 MG/ML SOLN COMPARISON:  August 08, 2022 FINDINGS: Cardiovascular: The thoracic aorta is normal in appearance. Satisfactory opacification of the pulmonary arteries to the segmental level. No evidence of pulmonary embolism. Normal heart size. No pericardial effusion. Mediastinum/Nodes: There is mild, stable bilateral hilar lymphadenopathy. Stable AP window and right paratracheal lymph nodes are also seen. Thyroid gland, trachea, and esophagus demonstrate no significant findings. Lungs/Pleura: Moderate to marked severity atelectasis and/or infiltrate is seen within the bilateral lower lobes. No pleural effusion or  pneumothorax is identified. Upper Abdomen: Multiple surgical clips are seen within the gallbladder fossa. Musculoskeletal: No chest wall abnormality. No acute or significant osseous findings. Review of the MIP images confirms the above findings. IMPRESSION: 1. No evidence of pulmonary embolism. 2. Moderate to marked severity bilateral lower lobe atelectasis and/or infiltrate. 3. Stable mild bilateral hilar and mediastinal lymphadenopathy. 4. Evidence of prior cholecystectomy. Electronically Signed   By: Aram Candela M.D.   On: 07/05/2023 03:16   DG Chest 2 View  Result Date: 07/05/2023 CLINICAL DATA:  Chest pain EXAM: CHEST - 2 VIEW COMPARISON:  08/08/2022 FINDINGS: Right shoulder replacement. Chronic elevation left diaphragm. Mild cardiomegaly with vascular congestion and diffuse interstitial process suggestive of minimal edema. Heterogeneous focal disease at the left base. No pneumothorax IMPRESSION: 1. Cardiomegaly with vascular congestion and mild interstitial edema. 2. Focal heterogeneous disease at the left base may reflect atelectasis or pneumonia. Electronically Signed   By: Jasmine Pang M.D.   On: 07/05/2023 02:05        Scheduled Meds:  ARIPiprazole  10 mg Oral QHS   ferrous sulfate  325 mg Oral Q breakfast   polyethylene glycol  17 g Oral Daily   [START ON 07/07/2023] predniSONE  40 mg Oral Q breakfast   Followed by   Melene Muller ON 07/11/2023] predniSONE  30 mg Oral Q breakfast   Followed by   Melene Muller ON 07/15/2023] predniSONE  20 mg Oral Q breakfast   sertraline  100 mg Oral Daily   Continuous Infusions:  cefTRIAXone (ROCEPHIN)  IV 2 g (07/06/23 1433)   doxycycline (VIBRAMYCIN) IV 100 mg (07/06/23 1432)     LOS: 1 day    Time spent: over 30 min    Lacretia Nicks, MD [facility]   To contact the attending provider between 7A-7P or the covering provider during after hours 7P-7A, please log into the web site [email or web address] and access using universal [place]  password for that web site. If you do not have the password, please call the hospital operator.  07/06/2023, 5:52 PM

## 2023-07-07 ENCOUNTER — Inpatient Hospital Stay (HOSPITAL_COMMUNITY): Payer: Medicaid Other

## 2023-07-07 DIAGNOSIS — D649 Anemia, unspecified: Secondary | ICD-10-CM | POA: Diagnosis not present

## 2023-07-07 DIAGNOSIS — R079 Chest pain, unspecified: Secondary | ICD-10-CM | POA: Diagnosis not present

## 2023-07-07 DIAGNOSIS — D696 Thrombocytopenia, unspecified: Secondary | ICD-10-CM | POA: Diagnosis not present

## 2023-07-07 LAB — TECHNOLOGIST SMEAR REVIEW
Plt Morphology: DECREASED
Plt Morphology: DECREASED

## 2023-07-07 LAB — BPAM RBC
Blood Product Expiration Date: 202408302359
ISSUE DATE / TIME: 202407291310
Unit Type and Rh: 5100

## 2023-07-07 LAB — RETICULOCYTES
Immature Retic Fract: 42.3 % — ABNORMAL HIGH (ref 2.3–15.9)
RBC.: 2.36 MIL/uL — ABNORMAL LOW (ref 3.87–5.11)
Retic Count, Absolute: 153.4 10*3/uL (ref 19.0–186.0)
Retic Ct Pct: 6.5 % — ABNORMAL HIGH (ref 0.4–3.1)

## 2023-07-07 LAB — RETIC PANEL
Immature Retic Fract: 24.8 % — ABNORMAL HIGH (ref 2.3–15.9)
RBC.: 2.92 MIL/uL — ABNORMAL LOW (ref 3.87–5.11)
Retic Count, Absolute: 175.5 10*3/uL (ref 19.0–186.0)
Retic Ct Pct: 6 % — ABNORMAL HIGH (ref 0.4–3.1)
Reticulocyte Hemoglobin: 29 pg (ref >27.9)

## 2023-07-07 LAB — TYPE AND SCREEN
ABO/RH(D): O POS
Antibody Screen: NEGATIVE
Unit division: 0

## 2023-07-07 LAB — HEMOGLOBIN AND HEMATOCRIT, BLOOD
HCT: 19.7 % — ABNORMAL LOW (ref 36.0–46.0)
HCT: 18.8 % — ABNORMAL LOW (ref 36.0–46.0)
HCT: 23.7 % — ABNORMAL LOW (ref 36.0–46.0)
Hemoglobin: 7 g/dL — ABNORMAL LOW (ref 12.0–15.0)
Hemoglobin: 6.6 g/dL — CL (ref 12.0–15.0)
Hemoglobin: 8.4 g/dL — ABNORMAL LOW (ref 12.0–15.0)

## 2023-07-07 LAB — C-REACTIVE PROTEIN: CRP: 4.3 mg/dL — ABNORMAL HIGH (ref <1.0)

## 2023-07-07 LAB — SEDIMENTATION RATE: Sed Rate: 26 mm/hr — ABNORMAL HIGH (ref 0–22)

## 2023-07-07 LAB — ANGIOTENSIN CONVERTING ENZYME: Angiotensin-Converting Enzyme: 29 U/L (ref 14–82)

## 2023-07-07 LAB — PREPARE RBC (CROSSMATCH)

## 2023-07-07 MED ORDER — OXYCODONE HCL 5 MG PO TABS
5.0000 mg | ORAL_TABLET | ORAL | Status: DC | PRN
  Administered 2023-07-08 – 2023-07-09 (×5): 5 mg via ORAL
  Filled 2023-07-07 (×9): qty 1

## 2023-07-07 MED ORDER — IOHEXOL 350 MG/ML SOLN
75.0000 mL | Freq: Once | INTRAVENOUS | Status: AC | PRN
  Administered 2023-07-07: 75 mL via INTRAVENOUS

## 2023-07-07 MED ORDER — CYANOCOBALAMIN 1000 MCG/ML IJ SOLN
1000.0000 ug | Freq: Every day | INTRAMUSCULAR | Status: DC
  Administered 2023-07-07 – 2023-07-09 (×3): 1000 ug via INTRAMUSCULAR
  Filled 2023-07-07 (×3): qty 1

## 2023-07-07 MED ORDER — OXYCODONE HCL 5 MG PO TABS
5.0000 mg | ORAL_TABLET | ORAL | Status: DC | PRN
  Administered 2023-07-07 – 2023-07-10 (×6): 5 mg via ORAL
  Filled 2023-07-07 (×3): qty 1

## 2023-07-07 MED ORDER — FOLIC ACID 1 MG PO TABS
1.0000 mg | ORAL_TABLET | Freq: Every day | ORAL | Status: DC
  Administered 2023-07-07 – 2023-07-10 (×4): 1 mg via ORAL
  Filled 2023-07-07 (×4): qty 1

## 2023-07-07 MED ORDER — SODIUM CHLORIDE 0.45 % IV SOLN: INTRAVENOUS | Status: DC

## 2023-07-07 MED ORDER — SODIUM CHLORIDE 0.9% IV SOLUTION: Freq: Once | INTRAVENOUS | Status: DC

## 2023-07-07 MED ORDER — PANTOPRAZOLE SODIUM 40 MG PO TBEC
40.0000 mg | DELAYED_RELEASE_TABLET | Freq: Every day | ORAL | Status: DC
  Administered 2023-07-07 – 2023-07-10 (×4): 40 mg via ORAL
  Filled 2023-07-07 (×4): qty 1

## 2023-07-07 MED ORDER — METHYLPREDNISOLONE SODIUM SUCC 40 MG IJ SOLR
40.0000 mg | Freq: Two times a day (BID) | INTRAMUSCULAR | Status: DC
  Administered 2023-07-07 – 2023-07-08 (×3): 40 mg via INTRAVENOUS
  Filled 2023-07-07 (×3): qty 1

## 2023-07-07 NOTE — Progress Notes (Signed)
PROGRESS NOTE    Tina Mayo  WJX:[id number] DOB: February 01, 1971 DOA: 07/05/2023 PCP: Pcp, No  Chief Complaint  Patient presents with   Chest Pain    Brief Narrative:   Tina Mayo is Tina Mayo 52 y.o. female with medical history significant for chronic iron deficiency anemia, chronic thrombocytopenia, who is admitted to [facility] on 07/05/2023 with community-acquired pneumonia after presenting from home to [facility] ED complaining of chest pain.   Assessment & Plan:   Principal Problem:   CAP (community acquired pneumonia) Active Problems:   Chest pain   Thrombocytopenia (HCC)   Depression   Tobacco abuse   GAD (generalized anxiety disorder)   Chronic iron deficiency anemia  Atypical Chest Pain Treating for pneumonia as noted below, but whole picture not quite coming together as chest pain seems out of proportion Troponins negative x2 reassuringly.  CT PE protocol negative for PE. BNP elevated, will follow echo (EF 60-65%, no RWMA)  Pain seems worse today, will repeat imaging, CT chest/abdomen/pelvis with contrast and follow  Bilateral Lower Lobe Atelectasis vs Infiltrate Presumed Community Acquired Pneumonia Having fevers now, follow repeat CT scan Continue ceftriaxone, doxycycline Negative covid, negative RVP Negative urine strep Pending mycoplasma pneumoniae ab and urine legionella MRSA PCR Will add on steroids - will transition back to IV steroids as she seemed improved after this higher dose Follow CRP, ESR (both only mildly elevated) I think pulmonary follow up is warranted outpatient  Fever Blood cultures CT chest abdomen pelvis with contrast Abx as above  Rheumatoid Arthritis Not on any DMARDS Anti CCP positive from 05/25/2021 (negative RF, negative ANA at that time) Steroids as above, will need rheum follow up outpatient  Myalgias Related to above?  Follow CK (wnl)  Mild Bilateral Hilar and Mediastinal Lymphadenopathy Some chronicity to  this Needs outpatient follow up Follow ACE test (normal) ? 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 I see positive sickle cell screen, but no hb electrophoresis - she tells me she has trait, though I'd think avascular necrosis would not be common with trait alone? Follow hemoglobin electrophoresis (pending)  Obesity Body mass index is 32.89 kg/m.    DVT prophylaxis: SCD Code Status: full Family Communication: none Disposition:   Status is: Inpatient Remains inpatient appropriate because: none   Consultants:  none  Procedures:  Echo IMPRESSIONS     1. Left ventricular ejection fraction, by estimation, is 60 to 65%. The  left ventricle has normal function. The left ventricle has no regional  wall motion abnormalities. Left ventricular diastolic parameters were  normal.   2. Right ventricular systolic function is normal. The right ventricular  size is normal. There is normal pulmonary artery systolic pressure.   3. The mitral valve is normal in structure. No evidence of mitral valve  regurgitation.   4. The aortic valve is normal in structure. Aortic valve regurgitation is  not visualized.   5. The inferior vena cava is normal in  size with greater than 50%  respiratory variability, suggesting right atrial pressure of 3 mmHg.   Conclusion(s)/Recommendation(s): Normal  biventricular function without  evidence of hemodynamically significant valvular heart disease.   Antimicrobials:  Anti-infectives (From admission, onward)    Start     Dose/Rate Route Frequency Ordered Stop   07/05/23 1000  cefTRIAXone (ROCEPHIN) 2 g in sodium chloride 0.9 % 100 mL IVPB        2 g 200 mL/hr over 30 Minutes Intravenous Every 24 hours 07/05/23 0514     07/05/23 0600  doxycycline (VIBRAMYCIN) 100 mg in sodium chloride 0.9 % 250 mL IVPB        100 mg 125 mL/hr over 120 Minutes Intravenous 2 times daily 07/05/23 0500     07/05/23 0345  vancomycin (VANCOREADY) IVPB 1500 mg/300 mL        1,500 mg 150 mL/hr over 120 Minutes Intravenous  Once 07/05/23 0330 07/05/23 0635   07/05/23 0330  aztreonam (AZACTAM) 2 g in sodium chloride 0.9 % 100 mL IVPB        2 g 200 mL/hr over 30 Minutes Intravenous  Once 07/05/23 0322 07/05/23 0428       Subjective: Continued pain   Objective: Vitals:   07/07/23 1314 07/07/23 1329 07/07/23 1415 07/07/23 1530  BP: (!) 88/55 (!) 100/56 (!) 85/49 97/66  Pulse:  100 93   Resp: 20 20 20    Temp: 100.1 F (37.8 C) 99 F (37.2 C) 99.9 F (37.7 C)   TempSrc: Oral Oral Oral   SpO2: 99% 91% 92%   Weight:      Height:        Intake/Output Summary (Last 24 hours) at 07/07/2023 1541 Last data filed at 07/07/2023 0737 Gross per 24 hour  Intake 804.8 ml  Output 300 ml  Net 504.8 ml   Filed Weights   07/05/23 0128  Weight: 95.3 kg    Examination:  General: No acute distress. Cardiovascular: RRR Lungs: unlabored TTP to region near midaxillary line  Abdomen: Soft, nontender, nondistended  Neurological: Alert and oriented 3. Moves all extremities 4 with equal strength. Cranial nerves II through XII grossly intact. Extremities: No clubbing or cyanosis. No edema.  Data Reviewed: I have personally reviewed following labs and imaging studies  CBC: Recent Labs  Lab 07/05/23 0135 07/06/23 0053 07/07/23 0231 07/07/23 0931  07/07/23 1348  WBC 13.2* 12.4* 15.7*  --   --   NEUTROABS  --  10.2* 10.1*  --   --   HGB 10.2* 8.9* 7.2* 7.0* 6.6*  HCT 28.3* 24.5* 20.0* 19.7* 18.8*  MCV 81.1 78.0* 82.3  --   --   PLT 82* 61* 45*  --   --     Basic Metabolic Panel: Recent Labs  Lab 07/05/23 0135 07/06/23 0053 07/07/23 0231  NA 138 135 136  K 3.7 3.5 3.6  CL 104 104 105  CO2 23 22 22   GLUCOSE 116* 99 97  BUN <5* 5* 11  CREATININE 0.75 0.80 0.82  CALCIUM 9.0 8.8* 8.6*  MG 1.8 1.8  --   PHOS 3.6 2.8  --     GFR: Estimated Creatinine Clearance: 94.1 mL/min (by C-G formula based on SCr of 0.82 mg/dL).  Liver Function Tests: Recent Labs  Lab 07/05/23 0135 07/06/23 0053 07/07/23 0231  AST 16 19 34  ALT 12 15 23   ALKPHOS 89 111 103  BILITOT 1.0 2.2* 1.6*  PROT 7.1 6.7 6.8  ALBUMIN 3.5 3.4* 3.2*  CBG: No results for input(s): "GLUCAP" in the last 168 hours.   Recent Results (from the past 240 hour(s))  SARS Coronavirus 2 by RT PCR (hospital order, performed in [facility] hospital lab) *cepheid single result test* Anterior Nasal Swab     Status: None   Collection Time: 07/05/23  2:15 AM   Specimen: Anterior Nasal Swab  Result Value Ref Range Status   SARS Coronavirus 2 by RT PCR NEGATIVE NEGATIVE Final    Comment: Performed at [facility] Lab, [address], [place], [place] [address]  Respiratory (~20 pathogens) panel by PCR     Status: None   Collection Time: 07/05/23  2:15 AM   Specimen: Nasopharyngeal Swab; Respiratory  Result Value Ref Range Status   Adenovirus NOT DETECTED NOT DETECTED Final   Coronavirus 229E NOT DETECTED NOT DETECTED Final    Comment: (NOTE) The Coronavirus on the Respiratory Panel, DOES NOT test for the novel  Coronavirus (2019 nCoV)    Coronavirus HKU1 NOT DETECTED NOT DETECTED Final   Coronavirus NL63 NOT DETECTED NOT DETECTED Final   Coronavirus OC43 NOT DETECTED NOT DETECTED Final   Metapneumovirus NOT DETECTED NOT DETECTED Final   Rhinovirus /  Enterovirus NOT DETECTED NOT DETECTED Final   Influenza [place] Mastrogiovanni NOT DETECTED NOT DETECTED Final   Influenza B NOT DETECTED NOT DETECTED Final   Parainfluenza Virus 1 NOT DETECTED NOT DETECTED Final   Parainfluenza Virus 2 NOT DETECTED NOT DETECTED Final   Parainfluenza Virus 3 NOT DETECTED NOT DETECTED Final   Parainfluenza Virus 4 NOT DETECTED NOT DETECTED Final   Respiratory Syncytial Virus NOT DETECTED NOT DETECTED Final   Bordetella pertussis NOT DETECTED NOT DETECTED Final   Bordetella Parapertussis NOT DETECTED NOT DETECTED Final   Chlamydophila pneumoniae NOT DETECTED NOT DETECTED Final   Mycoplasma pneumoniae NOT DETECTED NOT DETECTED Final    Comment: Performed at [facility], [address], [place], [place] [address]         Radiology Studies: No results found.      Scheduled Meds:  sodium chloride   Intravenous Once   ARIPiprazole  10 mg Oral QHS   cyanocobalamin  1,000 mcg Intramuscular Daily   ferrous sulfate  325 mg Oral Q breakfast   methylPREDNISolone (SOLU-MEDROL) injection  40 mg Intravenous Q12H   pantoprazole  40 mg Oral Daily   polyethylene glycol  17 g Oral Daily   sertraline  100 mg Oral Daily   Continuous Infusions:  cefTRIAXone (ROCEPHIN)  IV Stopped (07/07/23 1101)   doxycycline (VIBRAMYCIN) IV Stopped (07/07/23 1101)     LOS: 2 days    Time spent: 35 min critical care time with anemia requiring transfusion    Lacretia Nicks, MD [facility]   To contact the attending provider between 7A-7P or the covering provider during after hours 7P-7A, please log into the web site [email or web address] and access using universal [place] password for that web site. If you do not have the password, please call the hospital operator.  07/07/2023, 3:41 PM

## 2023-07-07 NOTE — Evaluation (Signed)
Occupational Therapy Evaluation Patient Details Name: Tina Mayo MRN: [id number] DOB: March 21, 1971 Today's Date: 07/07/2023   History of Present Illness Patient is a 52 yo woman that presents to the hospital for c/o chest pain and found to have community-aquired pneumonia.  PMHx: anemia, CAD, depression, thrombocytopenia   Clinical Impression   Patient reports living alone in an apartment and is Independent in ADLs and IADLs and works as a Teacher, English as a foreign language.  Patient is able to completed bed mobility with mod I with increased time 2/2 L shoulder and breast pain but otherwise, Independent/Supervision for functional mobility and LB ADLs.  No further OT intervention needed at this time.     Recommendations for follow up therapy are one component of a multi-disciplinary discharge planning process, led by the attending physician.  Recommendations may be updated based on patient status, additional functional criteria and insurance authorization.   Assistance Recommended at Discharge None  Patient can return home with the following      Functional Status Assessment  Patient has had a recent decline in their functional status and demonstrates the ability to make significant improvements in function in a reasonable and predictable amount of time.  Equipment Recommendations  None recommended by OT    Recommendations for Other Services       Precautions / Restrictions Precautions Precautions: Other (comment) Precaution Comments: tachycardia Restrictions Weight Bearing Restrictions: No      Mobility Bed Mobility Overal bed mobility: Modified Independent (using bed rails and increased time to completed supine to sit EOB)             General bed mobility comments: usinf bed rail to bring self to seated postion    Transfers Overall transfer level: Independent Equipment used: None                      Balance Overall balance assessment: Independent                                          ADL either performed or assessed with clinical judgement   ADL Overall ADL's : Modified independent                                             Vision Baseline Vision/History: 1 Wears glasses Patient Visual Report: No change from baseline       Perception     Praxis      Pertinent Vitals/Pain Pain Assessment Pain Assessment: 0-10 Pain Score: 7  Breathing: normal Negative Vocalization: none Facial Expression: smiling or inexpressive Body Language: relaxed Pain Location: L breast and shoulder Pain Descriptors / Indicators: Aching Pain Intervention(s): Monitored during session     Hand Dominance Right   Extremity/Trunk Assessment Upper Extremity Assessment Upper Extremity Assessment: Defer to OT evaluation   Lower Extremity Assessment Lower Extremity Assessment: Overall WFL for tasks assessed   Cervical / Trunk Assessment Cervical / Trunk Assessment: Normal   Communication Communication Communication: No difficulties   Cognition Arousal/Alertness: Awake/alert Behavior During Therapy: WFL for tasks assessed/performed Overall Cognitive Status: Within Functional Limits for tasks assessed  General Comments  pt with tachycardia up to 150s near end of ambulation, pt does not appear to be in distress, conversing freely during mobility without notable increase in work of breathing    Exercises     Shoulder Instructions      Home Living Family/patient expects to be discharged to:: Private residence Living Arrangements: Alone Available Help at Discharge: Family Type of Home: Apartment Home Access: Stairs to enter Secretary/administrator of Steps: 14 Entrance Stairs-Rails: Can reach both Home Layout: One level     Bathroom Shower/Tub: Chief Strategy Officer: Standard     Home Equipment: None          Prior Functioning/Environment  Prior Level of Function : Independent/Modified Independent;Driving;Working/employed             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 Problem List:        OT Treatment/Interventions:      OT Goals(Current goals can be found in the care plan section) Acute Rehab OT Goals OT Goal Formulation: With patient Time For Goal Achievement: 07/14/23 Potential to Achieve Goals: Good  OT Frequency:      Co-evaluation              AM-PAC OT "6 Clicks" Daily Activity     Outcome Measure Help from another person eating meals?: None Help from another person taking care of personal grooming?: None   Help from another person bathing (including washing, rinsing, drying)?: None Help from another person to put on and taking off regular upper body clothing?: None Help from another person to put on and taking off regular lower body clothing?: None 6 Click Score: 20   End of Session Nurse Communication: Mobility status  Activity Tolerance: Patient tolerated treatment well Patient left: in bed  OT Visit Diagnosis: Muscle weakness (generalized) (M62.81)                Time: 7846-9629 OT Time Calculation (min): 14 min Charges:  OT General Charges $OT Visit: 1 Visit OT Evaluation $OT Eval Low Complexity: 1 Low Governor Specking OT/L Denice Paradise 07/07/2023, 12:57 PM

## 2023-07-07 NOTE — Evaluation (Signed)
Physical Therapy Evaluation Patient Details Name: Tina Mayo MRN: [id number] DOB: Dec 29, 1970 Today's Date: 07/07/2023  History of Present Illness  Patient is a 52 yo woman that presents to the hospital for c/o chest pain and found to have community-aquired pneumonia.  PMHx: anemia, CAD, depression, thrombocytopenia  Clinical Impression  Pt presents to PT with deficits in endurance and cardiopulmonary function, along with reports of L chest/rib pain which radiates into L shoulder. Pt is able to mobilize independently, although with tachycardia into 150s. Pt does not appear outwardly fatigued and is able to talk freely when walking. Pt will benefit from frequent mobilization in an effort to improve activity tolerance. PT recommends discharge home when medically appropriate, no post-acute PT needs anticipated.        If plan is discharge home, recommend the following:     Can travel by private vehicle        Equipment Recommendations None recommended by PT  Recommendations for Other Services       Functional Status Assessment Patient has had a recent decline in their functional status and demonstrates the ability to make significant improvements in function in a reasonable and predictable amount of time.     Precautions / Restrictions Precautions Precautions: Other (comment) Precaution Comments: tachycardia Restrictions Weight Bearing Restrictions: No      Mobility  Bed Mobility Overal bed mobility: Independent                  Transfers Overall transfer level: Independent                      Ambulation/Gait Ambulation/Gait assistance: Modified independent (Device/Increase time) Gait Distance (Feet): 200 Feet Assistive device: None Gait Pattern/deviations: Step-through pattern Gait velocity: functional Gait velocity interpretation: 1.31 - 2.62 ft/sec, indicative of limited community ambulator   General Gait Details: steady step-through  gait  Stairs Stairs:  (pt declines stair training.)          Wheelchair Mobility     Tilt Bed    Modified Rankin (Stroke Patients Only)       Balance Overall balance assessment: No apparent balance deficits (not formally assessed)                                           Pertinent Vitals/Pain Pain Assessment Pain Assessment: 0-10 Pain Score: 8  Pain Location: L chest into shoulder Pain Descriptors / Indicators: Aching Pain Intervention(s): Monitored during session    Home Living Family/patient expects to be discharged to:: Private residence Living Arrangements: Alone Available Help at Discharge: Family Type of Home: Apartment Home Access: Stairs to enter Entrance Stairs-Rails: Can reach both Entrance Stairs-Number of Steps: 14   Home Layout: One level Home Equipment: None      Prior Function Prior Level of Function : Independent/Modified Independent;Driving;Working/employed             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   Dominant Hand: Right    Extremity/Trunk Assessment   Upper Extremity Assessment Upper Extremity Assessment: Defer to OT evaluation    Lower Extremity Assessment Lower Extremity Assessment: Overall WFL for tasks assessed    Cervical / Trunk Assessment Cervical / Trunk Assessment: Normal  Communication   Communication: No difficulties  Cognition Arousal/Alertness: Awake/alert Behavior During Therapy: WFL for tasks assessed/performed Overall  Cognitive Status: Within Functional Limits for tasks assessed                                          General Comments General comments (skin integrity, edema, etc.): pt with tachycardia up to 150s near end of ambulation, pt does not appear to be in distress, conversing freely during mobility without notable increase in work of breathing    Exercises     Assessment/Plan    PT Assessment Patient needs  continued PT services  PT Problem List Decreased activity tolerance;Cardiopulmonary status limiting activity       PT Treatment Interventions DME instruction;Gait training;Stair training;Functional mobility training;Therapeutic activities;Therapeutic exercise;Patient/family education    PT Goals (Current goals can be found in the Care Plan section)  Acute Rehab PT Goals Patient Stated Goal: to return home, resolve pain and improve activity tolerance PT Goal Formulation: With patient Time For Goal Achievement: 07/21/23 Potential to Achieve Goals: Fair Additional Goals Additional Goal #1: Pt will score >19/24 on the DGI to indicate a reduced risk for falls Additional Goal #2: Pt will report 0/4 DOE when ambulating for >1000' to demonstrate improved activity tolerance    Frequency Min 1X/week     Co-evaluation               AM-PAC PT "6 Clicks" Mobility  Outcome Measure Help needed turning from your back to your side while in a flat bed without using bedrails?: None Help needed moving from lying on your back to sitting on the side of a flat bed without using bedrails?: None Help needed moving to and from a bed to a chair (including a wheelchair)?: None Help needed standing up from a chair using your arms (e.g., wheelchair or bedside chair)?: None Help needed to walk in hospital room?: None Help needed climbing 3-5 steps with a railing? : A Little 6 Click Score: 23    End of Session   Activity Tolerance: Patient tolerated treatment well Patient left: Other (comment) (in bathroom on commode, nurse tech present) Nurse Communication: Mobility status PT Visit Diagnosis: Other abnormalities of gait and mobility (R26.89)    Time: 0981-1914 PT Time Calculation (min) (ACUTE ONLY): 10 min   Charges:   PT Evaluation $PT Eval Low Complexity: 1 Low   PT General Charges $$ ACUTE PT VISIT: 1 Visit         Arlyss Gandy, PT, DPT Acute Rehabilitation Office  [phone]   Arlyss Gandy 07/07/2023, 11:32 AM

## 2023-07-07 NOTE — Consult Note (Addendum)
New Hematology/Oncology Consult   Requesting MD: Lacretia Nicks      Reason for Consult: Anemia, thrombocytopenia  HPI: Tina Mayo presented to the emergency room 07/05/2023 with chest pain and shortness of breath.  A chest x-ray revealed changes at the left base consistent with atelectasis or pneumonia.  There is cardiomegaly and mild edema.  CT chest revealed no pulmonary embolism.  Bilateral lower lobe atelectasis or infiltrates.  Stable mild bilateral hilar and mediastinal lymphadenopathy compared to a CT from August 2023. She admitted and placed on antibiotics.  She reports a chronic history of "anemia "followed by Dr. Concepcion Elk.  She is not aware of a history of thrombocytopenia.  Her sister has sickle cell disease and she has sickle cell trait.  No history of a sickle cell crisis.  She is not aware of a history of thalassemia or hemoglobin C.  On 07/05/2023 the hemoglobin returned at 10.2, MCV 81.1, and platelets 82,000.  A CBC on 12/10/2022 found the hemoglobin 11.3, platelets 106,000, and WBC 13.1.  Review the medical record reveals a history of chronic anemia and thrombocytopenia.  The MCV has been intermittently low.      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   Bell's palsy    Depression    Sickle cell trait (HCC)    Sleep apnea   : G6, P5, 1 miscarriage   Past Surgical History:  Procedure Laterality Date   benign breast tumor removed     2006   BREAST BIOPSY Right 2014   benign   BREAST EXCISIONAL BIOPSY Right 2007   benign    CHOLECYSTECTOMY N/A 12/16/2018   Procedure: LAPAROSCOPIC CHOLECYSTECTOMY;  Surgeon: Berna Bue, MD;  Location: [facility] OR;  Service: General;  Laterality: N/A;   right ankle surgery     2010   TOTAL SHOULDER ARTHROPLASTY Right 06/29/2019   Procedure: TOTAL SHOULDER ARTHROPLASTY;  Surgeon: Teryl Lucy, MD;  Location: [facility];  Service: Orthopedics;  Laterality:  Right;   TUBAL LIGATION    :   Current Facility-Administered Medications:    0.9 %  sodium chloride infusion (Manually program via Guardrails IV Fluids), , Intravenous, Once, Zigmund Daniel., MD   acetaminophen (TYLENOL) tablet 650 mg, 650 mg, Oral, Q6H PRN, 650 mg at 07/06/23 0419 **OR** acetaminophen (TYLENOL) suppository 650 mg, 650 mg, Rectal, Q6H PRN, Howerter, Justin B, DO   ARIPiprazole (ABILIFY) tablet 10 mg, 10 mg, Oral, QHS, Howerter, Justin B, DO, 10 mg at 07/06/23 2115   benzonatate (TESSALON) capsule 200 mg, 200 mg, Oral, TID PRN, Howerter, Justin B, DO   cefTRIAXone (ROCEPHIN) 2 g in sodium chloride 0.9 % 100 mL IVPB, 2 g, Intravenous, Q24H, Howerter, Justin B, DO, Stopped at 07/07/23 1101   cyanocobalamin (VITAMIN B12) injection 1,000 mcg, 1,000 mcg, Intramuscular, Daily, Zigmund Daniel., MD, 1,000 mcg at 07/07/23 1100   doxycycline (VIBRAMYCIN) 100 mg in sodium chloride 0.9 % 250 mL IVPB, 100 mg, Intravenous, BID, Howerter, Justin B, DO, Stopped at 07/07/23 1101   ferrous sulfate tablet 325 mg, 325 mg, Oral, Q breakfast, Howerter, Justin B, DO, 325 mg at 07/07/23 0830   folic acid (FOLVITE) tablet 1 mg, 1 mg, Oral, Daily, Lowell Guitar, Lottie Mussel., MD   melatonin tablet 3 mg, 3 mg, Oral, QHS PRN, Howerter, Justin B, DO   methylPREDNISolone sodium succinate (SOLU-MEDROL) 40 mg/mL injection 40 mg, 40  mg, Intravenous, Q12H, Zigmund Daniel., MD, 40 mg at 07/07/23 1249   morphine (PF) 4 MG/ML injection 4 mg, 4 mg, Intravenous, Q4H PRN, Zigmund Daniel., MD, 4 mg at 07/05/23 1539   naloxone (NARCAN) injection 0.4 mg, 0.4 mg, Intravenous, PRN, Howerter, Justin B, DO   nicotine (NICODERM CQ - dosed in mg/24 hours) patch 14 mg, 14 mg, Transdermal, Daily PRN, Howerter, Justin B, DO   ondansetron (ZOFRAN) injection 4 mg, 4 mg, Intravenous, Q6H PRN, Howerter, Justin B, DO   oxyCODONE (Oxy IR/ROXICODONE) immediate release tablet 5 mg, 5 mg, Oral, Q4H PRN **OR**  oxyCODONE (Oxy IR/ROXICODONE) immediate release tablet 5 mg, 5 mg, Oral, Q4H PRN, Zigmund Daniel., MD, 5 mg at 07/07/23 1300   pantoprazole (PROTONIX) EC tablet 40 mg, 40 mg, Oral, Daily, Zigmund Daniel., MD, 40 mg at 07/07/23 1249   polyethylene glycol (MIRALAX / GLYCOLAX) packet 17 g, 17 g, Oral, Daily, Zigmund Daniel., MD, 17 g at 07/07/23 0830   sertraline (ZOLOFT) tablet 100 mg, 100 mg, Oral, Daily, Howerter, Justin B, DO, 100 mg at 07/07/23 0830:   sodium chloride   Intravenous Once   ARIPiprazole  10 mg Oral QHS   cyanocobalamin  1,000 mcg Intramuscular Daily   ferrous sulfate  325 mg Oral Q breakfast   folic acid  1 mg Oral Daily   methylPREDNISolone (SOLU-MEDROL) injection  40 mg Intravenous Q12H   pantoprazole  40 mg Oral Daily   polyethylene glycol  17 g Oral Daily   sertraline  100 mg Oral Daily  :   Allergies  Allergen Reactions   Levaquin [Levofloxacin] Anaphylaxis   Ca Phosphate-Cholecalciferol Hives   Cvs Yogurt + Calcium [Ca Phosphate-Cholecalciferol] Other (See Comments)    Unknown reaction   Origanum Oil Hives   Penicillins Swelling  :  FH: Her mother had breast cancer, her sister has sickle cell disease  SOCIAL HISTORY: She lives alone in [place].  She smokes cigarettes.  She drinks wine occasionally.  No risk factor for HIV or hepatitis.  She received Red cell transfusion at the childbirth  Review of Systems:  Positives include: Anterior and left chest discomfort, bruises over the legs intermittently  A complete ROS was otherwise negative.   Physical Exam:  Blood pressure 103/61, pulse 85, temperature 99.5 F (37.5 C), temperature source Oral, resp. rate 20, height 5\' 7"  (1.702 m), weight 210 lb (95.3 kg), last menstrual period 01/12/2017, SpO2 91%.  HEENT: Upper and lower denture plate, no thrush or bleeding Lungs: End inspiratory rales at the left lower posterior chest, no respiratory distress Cardiac: Regular rate and  rhythm Abdomen: No hepatosplenomegaly  Vascular: No leg edema Lymph nodes: No cervical, supraclavicular, axillary, or inguinal nodes Neurologic: Alert and oriented, the motor exam appears intact in the upper and lower extremities bilaterally Skin: No rash Musculoskeletal: Tender at the left lateral/posterior chest wall, no mass  LABS:   Recent Labs    07/06/23 0053 07/07/23 0231 07/07/23 0931 07/07/23 1348  WBC 12.4* 15.7*  --   --   HGB 8.9* 7.2* 7.0* 6.6*  HCT 24.5* 20.0* 19.7* 18.8*  PLT 61* 45*  --   --   Blood smear 07/07/2023: Numerous target cells, increased variation in Red cell size, few ovalocytes and teardrops, increased polychromasia, rare "sickle "cell, rare nucleated red cell decreased platelets, majority the white cells are mature appearing neutrophils and lymphocytes.  No monotonous white cell population.   Recent Labs  07/06/23 0053 07/07/23 0231  NA 135 136  K 3.5 3.6  CL 104 105  CO2 22 22  GLUCOSE 99 97  BUN 5* 11  CREATININE 0.80 0.82  CALCIUM 8.8* 8.6*      RADIOLOGY:  ECHOCARDIOGRAM COMPLETE  Result Date: 07/05/2023    ECHOCARDIOGRAM REPORT   Patient Name:   BENNETT KAUZLARICH Date of Exam: 07/05/2023 Medical Rec #:  [id number]             Height:       67.0 in Accession #:    [id number]            Weight:       210.0 lb Date of Birth:  06/29/1971              BSA:          2.065 m Patient Age:    52 years              BP:           159/86 mmHg Patient Gender: F                     HR:           68 bpm. Exam Location:  Inpatient Procedure: 2D Echo, Color Doppler and Cardiac Doppler Indications:    chest pain  History:        Patient has prior history of Echocardiogram examinations, most                 recent 08/26/2013. Pneumonia, Signs/Symptoms:Chest Pain; Risk                 Factors:Current Smoker.  Sonographer:    Delcie Roch RDCS Referring Phys: [phone] A CALDWELL POWELL JR IMPRESSIONS  1. Left ventricular ejection fraction, by estimation,  is 60 to 65%. The left ventricle has normal function. The left ventricle has no regional wall motion abnormalities. Left ventricular diastolic parameters were normal.  2. Right ventricular systolic function is normal. The right ventricular size is normal. There is normal pulmonary artery systolic pressure.  3. The mitral valve is normal in structure. No evidence of mitral valve regurgitation.  4. The aortic valve is normal in structure. Aortic valve regurgitation is not visualized.  5. The inferior vena cava is normal in size with greater than 50% respiratory variability, suggesting right atrial pressure of 3 mmHg. Conclusion(s)/Recommendation(s): Normal biventricular function without evidence of hemodynamically significant valvular heart disease. FINDINGS  Left Ventricle: Left ventricular ejection fraction, by estimation, is 60 to 65%. The left ventricle has normal function. The left ventricle has no regional wall motion abnormalities. The left ventricular internal cavity size was normal in size. There is  no left ventricular hypertrophy. Left ventricular diastolic parameters were normal. Right Ventricle: The right ventricular size is normal. Right ventricular systolic function is normal. There is normal pulmonary artery systolic pressure. The tricuspid regurgitant velocity is 2.80 m/s, and with an assumed right atrial pressure of 3 mmHg,  the estimated right ventricular systolic pressure is 34.4 mmHg. Left Atrium: Left atrial size was normal in size. Right Atrium: Right atrial size was normal in size. Pericardium: There is no evidence of pericardial effusion. Mitral Valve: The mitral valve is normal in structure. No evidence of mitral valve regurgitation. Tricuspid Valve: Tricuspid valve regurgitation is mild. Aortic Valve: The aortic valve is normal in structure. Aortic valve regurgitation is not visualized. Pulmonic Valve: The pulmonic valve was not well  visualized. Pulmonic valve regurgitation is not  visualized. Aorta: The aortic root and ascending aorta are structurally normal, with no evidence of dilitation. Venous: The inferior vena cava is normal in size with greater than 50% respiratory variability, suggesting right atrial pressure of 3 mmHg. IAS/Shunts: No atrial level shunt detected by color flow Doppler.  LEFT VENTRICLE PLAX 2D LVIDd:         4.40 cm   Diastology LVIDs:         2.80 cm   LV e' medial:    10.00 cm/s LV PW:         0.80 cm   LV E/e' medial:  11.6 LV IVS:        0.90 cm   LV e' lateral:   10.20 cm/s LVOT diam:     1.90 cm   LV E/e' lateral: 11.4 LV SV:         78 LV SV Index:   38 LVOT Area:     2.84 cm  RIGHT VENTRICLE             IVC RV Basal diam:  2.60 cm     IVC diam: 1.30 cm RV S prime:     14.90 cm/s TAPSE (M-mode): 2.5 cm LEFT ATRIUM           Index        RIGHT ATRIUM           Index LA diam:      2.90 cm 1.40 cm/m   RA Area:     12.50 cm LA Vol (A4C): 42.6 ml 20.63 ml/m  RA Volume:   26.90 ml  13.03 ml/m  AORTIC VALVE LVOT Vmax:   139.00 cm/s LVOT Vmean:  89.300 cm/s LVOT VTI:    0.275 m  AORTA Ao Root diam: 3.00 cm Ao Asc diam:  2.50 cm MITRAL VALVE                TRICUSPID VALVE MV Area (PHT): 3.37 cm     TR Peak grad:   31.4 mmHg MV Decel Time: 225 msec     TR Vmax:        280.00 cm/s MV E velocity: 116.00 cm/s MV A velocity: 91.30 cm/s   SHUNTS MV E/A ratio:  1.27         Systemic VTI:  0.28 m                             Systemic Diam: 1.90 cm Carolan Clines Electronically signed by Carolan Clines Signature Date/Time: 07/05/2023/4:01:07 PM    Final    CT Angio Chest PE W and/or Wo Contrast  Result Date: 07/05/2023 CLINICAL DATA:  Suspected pulmonary embolism. EXAM: CT ANGIOGRAPHY CHEST WITH CONTRAST TECHNIQUE: Multidetector CT imaging of the chest was performed using the standard protocol during bolus administration of intravenous contrast. Multiplanar CT image reconstructions and MIPs were obtained to evaluate the vascular anatomy. RADIATION DOSE REDUCTION: This exam was  performed according to the departmental dose-optimization program which includes automated exposure control, adjustment of the mA and/or kV according to patient size and/or use of iterative reconstruction technique. CONTRAST:  75mL OMNIPAQUE IOHEXOL 350 MG/ML SOLN COMPARISON:  August 08, 2022 FINDINGS: Cardiovascular: The thoracic aorta is normal in appearance. Satisfactory opacification of the pulmonary arteries to the segmental level. No evidence of pulmonary embolism. Normal heart size. No pericardial effusion. Mediastinum/Nodes: There is mild, stable bilateral hilar lymphadenopathy. Stable  AP window and right paratracheal lymph nodes are also seen. Thyroid gland, trachea, and esophagus demonstrate no significant findings. Lungs/Pleura: Moderate to marked severity atelectasis and/or infiltrate is seen within the bilateral lower lobes. No pleural effusion or pneumothorax is identified. Upper Abdomen: Multiple surgical clips are seen within the gallbladder fossa. Musculoskeletal: No chest wall abnormality. No acute or significant osseous findings. Review of the MIP images confirms the above findings. IMPRESSION: 1. No evidence of pulmonary embolism. 2. Moderate to marked severity bilateral lower lobe atelectasis and/or infiltrate. 3. Stable mild bilateral hilar and mediastinal lymphadenopathy. 4. Evidence of prior cholecystectomy. Electronically Signed   By: Aram Candela M.D.   On: 07/05/2023 03:16   DG Chest 2 View  Result Date: 07/05/2023 CLINICAL DATA:  Chest pain EXAM: CHEST - 2 VIEW COMPARISON:  08/08/2022 FINDINGS: Right shoulder replacement. Chronic elevation left diaphragm. Mild cardiomegaly with vascular congestion and diffuse interstitial process suggestive of minimal edema. Heterogeneous focal disease at the left base. No pneumothorax IMPRESSION: 1. Cardiomegaly with vascular congestion and mild interstitial edema. 2. Focal heterogeneous disease at the left base may reflect atelectasis or  pneumonia. Electronically Signed   By: Jasmine Pang M.D.   On: 07/05/2023 02:05    Assessment and Plan:   Chest pain Admission CT chest suggestive of pneumonia Anemia Thrombocytopenia Sickle cell trait Tobacco use Family history of sickle cell disease Family history of breast cancer Mild mediastinal/hilar lymphadenopathy-present on a previous CT B12 deficiency History of avascular necrosis  Tina Mayo is admitted with chest pain and a clinical diagnosis of pneumonia.  She has a chronic history of anemia/thrombocytopenia with a reported history of sickle cell trait.  The peripheral blood smear findings are concerning for a sickle cell variant such as [place] or sickle thalassemia variant.  The acute drop in the hemoglobin during this hospital admission could be related to infection or hemolysis.  We need to consider whether the current clinical presentation is related to a sickle cell crisis.  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  The spleen does not appear enlarged.  Recommendations: Continue antibiotics Intravenous hydration, oxygen support as needed B12 supplementation Agree with Red cell transfusion Follow-up hemoglobin electrophoresis Check reticulocyte count Daily CBC Folic acid I will continue following her in the hospital and outpatient follow-up will be arranged in the hematology clinic as needed    Thornton Papas, MD 07/07/2023, 4:53 PM

## 2023-07-07 NOTE — Progress Notes (Signed)
   07/07/23 0817  Assess: MEWS Score  Temp (!) 100.4 F (38 C)  BP (!) 97/50  MAP (mmHg) (!) 58  Pulse Rate (!) 116  Resp 18  SpO2 91 %  O2 Device Room Air  Assess: MEWS Score  MEWS Temp 0  MEWS Systolic 1  MEWS Pulse 2  MEWS RR 0  MEWS LOC 0  MEWS Score 3  MEWS Score Color Yellow  Assess: if the MEWS score is Yellow or Red  Were vital signs accurate and taken at a resting state? Yes  Does the patient meet 2 or more of the SIRS criteria? Yes  Does the patient have a confirmed or suspected source of infection? Yes  MEWS guidelines implemented  Yes, yellow  Treat  MEWS Interventions Considered administering scheduled or prn medications/treatments as ordered  Take Vital Signs  Increase Vital Sign Frequency  Yellow: Q2hr x1, continue Q4hrs until patient remains green for 12hrs  Escalate  MEWS: Escalate Yellow: Discuss with charge nurse and consider notifying provider and/or RRT  Notify: Charge Nurse/RN  Name of Charge Nurse/RN Notified Lafonda Mosses, RN  Provider Notification  Provider Name/Title Lowell Guitar  Date Provider Notified 07/07/23  Time Provider Notified 0915  Method of Notification  (chat)  Notification Reason Other (Comment) (yellow mews)  Assess: SIRS CRITERIA  SIRS Temperature  0  SIRS Pulse 1  SIRS Respirations  0  SIRS WBC 0  SIRS Score Sum  1

## 2023-07-07 NOTE — Plan of Care (Signed)

## 2023-07-08 DIAGNOSIS — D649 Anemia, unspecified: Secondary | ICD-10-CM | POA: Diagnosis not present

## 2023-07-08 DIAGNOSIS — R079 Chest pain, unspecified: Secondary | ICD-10-CM | POA: Diagnosis not present

## 2023-07-08 LAB — MAGNESIUM: Magnesium: 2.2 mg/dL (ref 1.7–2.4)

## 2023-07-08 LAB — BASIC METABOLIC PANEL
Anion gap: 9 (ref 5–15)
BUN: 13 mg/dL (ref 6–20)
CO2: 21 mmol/L — ABNORMAL LOW (ref 22–32)
Calcium: 8.4 mg/dL — ABNORMAL LOW (ref 8.9–10.3)
Chloride: 106 mmol/L (ref 98–111)
Creatinine, Ser: 0.77 mg/dL (ref 0.44–1.00)
GFR, Estimated: >60 mL/min (ref >60)
Glucose, Bld: 105 mg/dL — ABNORMAL HIGH (ref 70–99)
Potassium: 4.1 mmol/L (ref 3.5–5.1)
Sodium: 136 mmol/L (ref 135–145)

## 2023-07-08 LAB — CBC
HCT: 21.7 % — ABNORMAL LOW (ref 36.0–46.0)
Hemoglobin: 7.8 g/dL — ABNORMAL LOW (ref 12.0–15.0)
MCH: 29.2 pg (ref 26.0–34.0)
MCHC: 35.9 g/dL (ref 30.0–36.0)
MCV: 81.3 fL (ref 80.0–100.0)
Platelets: 78 10*3/uL — ABNORMAL LOW (ref 150–400)
RBC: 2.67 MIL/uL — ABNORMAL LOW (ref 3.87–5.11)
RDW: 15.4 % (ref 11.5–15.5)
WBC: 27.8 10*3/uL — ABNORMAL HIGH (ref 4.0–10.5)
nRBC: 3 % — ABNORMAL HIGH (ref 0.0–0.2)

## 2023-07-08 LAB — LEGIONELLA PNEUMOPHILA SEROGP 1 UR AG: L. pneumophila Serogp 1 Ur Ag: NEGATIVE

## 2023-07-08 LAB — PHOSPHORUS: Phosphorus: 4.6 mg/dL (ref 2.5–4.6)

## 2023-07-08 LAB — MYCOPLASMA PNEUMONIAE ANTIBODY, IGM: Mycoplasma pneumo IgM: <770 U/mL (ref 0–769)

## 2023-07-08 MED ORDER — PREDNISONE 20 MG PO TABS
40.0000 mg | ORAL_TABLET | Freq: Every day | ORAL | Status: DC
  Administered 2023-07-09 – 2023-07-10 (×2): 40 mg via ORAL
  Filled 2023-07-08 (×2): qty 2

## 2023-07-08 MED ORDER — LACTATED RINGERS IV SOLN: INTRAVENOUS | Status: DC

## 2023-07-08 MED ORDER — PREDNISONE 10 MG PO TABS: 10.0000 mg | ORAL_TABLET | Freq: Every day | ORAL | Status: DC

## 2023-07-08 MED ORDER — PREDNISONE 20 MG PO TABS: 20.0000 mg | ORAL_TABLET | Freq: Every day | ORAL | Status: DC

## 2023-07-08 MED ORDER — PREDNISONE 20 MG PO TABS: 30.0000 mg | ORAL_TABLET | Freq: Every day | ORAL | Status: DC

## 2023-07-08 MED ORDER — KETOROLAC TROMETHAMINE 15 MG/ML IJ SOLN
15.0000 mg | Freq: Four times a day (QID) | INTRAMUSCULAR | Status: DC
  Administered 2023-07-08 – 2023-07-10 (×9): 15 mg via INTRAVENOUS
  Filled 2023-07-08 (×8): qty 1

## 2023-07-08 MED ORDER — PREDNISONE 20 MG PO TABS: 40.0000 mg | ORAL_TABLET | Freq: Every day | ORAL | Status: DC

## 2023-07-08 NOTE — Progress Notes (Signed)
PROGRESS NOTE    Tina Mayo  FIE:[id number] DOB: [birth year] DOA: 07/05/2023 PCP: Pcp, No  Chief Complaint  Patient presents with   Chest Pain    Brief Narrative:   Tina Mayo is Tina Mayo 52 y.o. female with medical history significant for chronic iron deficiency anemia, chronic thrombocytopenia, who is admitted to [facility] on 07/05/2023 with community-acquired pneumonia after presenting from home to [facility] ED complaining of chest pain.   Assessment & Plan:   Principal Problem:   CAP (community acquired pneumonia) Active Problems:   Chest pain   Thrombocytopenia (HCC)   Depression   Tobacco abuse   GAD (generalized anxiety disorder)   Chronic iron deficiency anemia  Atypical Chest Pain Treating for pneumonia as noted below, but whole picture not quite coming together as chest pain seems out of proportion With question of sickle cell disease, this could represent pain crisis  Troponins negative x2 reassuringly.  CT PE protocol negative for PE. BNP elevated, will follow echo (EF 60-65%, no RWMA)   Bilateral Lower Lobe Atelectasis vs Infiltrate Presumed Community Acquired Pneumonia Fever  CT chest with patchy airspace disease in lungs bilaterally with consolidation in the left upper lobe and right middle lobe suspicious for pneumonia Continue ceftriaxone, doxycycline Negative covid, negative RVP Negative urine strep Pending mycoplasma pneumoniae ab and urine legionella MRSA PCR Steroids Follow CRP, ESR (both only mildly elevated) I think pulmonary follow up is warranted outpatient  Sickle Cell Trait? 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  I see positive sickle cell screen, but no hb electrophoresis - she tells me she has trait, though I would not expect to see avascular necrosis with trait alone.  Agree with Dr. Truett Perna, presentation concerning for sickle cell variant and current clinical presentation (as well as many of her  past hospital encounters) could be related to sickle cell crises. Follow hemoglobin electrophoresis (pending - per discussion with [facility], this may take 12 days from when it was sent - will need to follow with heme outpatient) Needs heme referral outpatient   Anemia of Chronic Disease Thrombocytopenia  B12 Deficiency  Follow anemia labs -> b12 deficiency and anemia of chronic disease - retics hypoproliferative Target cells on smear Haptoglobin, LDH pending B12 supplementation Hb downtrending, transfuse 1 unit pRBC and follow  Hematology consult  Rheumatoid Arthritis Not on any DMARDS Anti CCP positive from 05/25/2021 (negative RF, negative ANA at that time) Steroids as above, consider taper and then follow with rheum?, will need rheum follow up outpatient  Myalgias Related to above?  Follow CK (wnl)  Mild Bilateral Hilar and Mediastinal Lymphadenopathy Some chronicity to this Needs outpatient follow up Follow ACE test (normal) ? Sarcoid  Bell's Palsy R sided facial droop x12 years Apparently seen in 5-15% percentage of patient's with sarcoid, in setting of above will follow ACE test (normal)  Vitamin B12 Deficiency Start supplementation IM   Obesity Body mass index is 32.89 kg/m.    DVT prophylaxis: SCD Code Status: full Family Communication: none Disposition:   Status is: Inpatient Remains inpatient appropriate because: none   Consultants:  none  Procedures:  Echo IMPRESSIONS     1. Left ventricular ejection fraction, by estimation, is 60 to 65%. The  left ventricle has normal function. The left ventricle has no regional  wall motion abnormalities. Left ventricular diastolic parameters were  normal.   2. Right ventricular systolic function is normal. The right ventricular  size is normal. There  is normal pulmonary artery systolic pressure.   3. The mitral valve is normal in structure. No evidence of mitral valve  regurgitation.   4. The aortic valve  is normal in structure. Aortic valve regurgitation is  not visualized.   5. The inferior vena cava is normal in size with greater than 50%  respiratory variability, suggesting right atrial pressure of 3 mmHg.   Conclusion(s)/Recommendation(s): Normal biventricular function without  evidence of hemodynamically significant valvular heart disease.   Antimicrobials:  Anti-infectives (From admission, onward)    Start     Dose/Rate Route Frequency Ordered Stop   07/05/23 1000  cefTRIAXone (ROCEPHIN) 2 g in sodium chloride 0.9 % 100 mL IVPB        2 g 200 mL/hr over 30 Minutes Intravenous Every 24 hours 07/05/23 0514 07/10/23 0959   07/05/23 0600  doxycycline (VIBRAMYCIN) 100 mg in sodium chloride 0.9 % 250 mL IVPB        100 mg 125 mL/hr over 120 Minutes Intravenous 2 times daily 07/05/23 0500 07/10/23 0959   07/05/23 0345  vancomycin (VANCOREADY) IVPB 1500 mg/300 mL        1,500 mg 150 mL/hr over 120 Minutes Intravenous  Once 07/05/23 0330 07/05/23 0635   07/05/23 0330  aztreonam (AZACTAM) 2 g in sodium chloride 0.9 % 100 mL IVPB        2 g 200 mL/hr over 30 Minutes Intravenous  Once 07/05/23 0322 07/05/23 0428       Subjective: Complains of L sided CP  Objective: Vitals:   07/07/23 2202 07/08/23 0422 07/08/23 0905 07/08/23 1656  BP: 102/66 107/62 (!) 107/57 100/68  Pulse: 84 82 79 82  Resp: 18  18 18   Temp: 99.2 F (37.3 C) 99 F (37.2 C) 98.6 F (37 C)   TempSrc: Oral Oral    SpO2: 91% 92% 93% 97%  Weight:      Height:        Intake/Output Summary (Last 24 hours) at 07/08/2023 1727 Last data filed at 07/08/2023 1501 Gross per 24 hour  Intake 1828.71 ml  Output --  Net 1828.71 ml   Filed Weights   07/05/23 0128  Weight: 95.3 kg    Examination:  General: No acute distress. Cardiovascular: RRR Lungs: Clear to auscultation bilaterally  Abdomen: Soft, nontender, nondistended  Neurological: Alert and oriented 3. Moves all extremities 4 with equal strength.  Cranial nerves II through XII grossly intact. Extremities: No clubbing or cyanosis. No edema.   Data Reviewed: I have personally reviewed following labs and imaging studies  CBC: Recent Labs  Lab 07/05/23 0135 07/06/23 0053 07/07/23 0231 07/07/23 0931 07/07/23 1348 07/07/23 1739 07/08/23 0809  WBC 13.2* 12.4* 15.7*  --   --   --  27.8*  NEUTROABS  --  10.2* 10.1*  --   --   --   --   HGB 10.2* 8.9* 7.2* 7.0* 6.6* 8.4* 7.8*  HCT 28.3* 24.5* 20.0* 19.7* 18.8* 23.7* 21.7*  MCV 81.1 78.0* 82.3  --   --   --  81.3  PLT 82* 61* 45*  --   --   --  78*    Basic Metabolic Panel: Recent Labs  Lab 07/05/23 0135 07/06/23 0053 07/07/23 0231 07/08/23 0809  NA 138 135 136 136  K 3.7 3.5 3.6 4.1  CL 104 104 105 106  CO2 23 22 22  21*  GLUCOSE 116* 99 97 105*  BUN <5* 5* 11 13  CREATININE 0.75 0.80 0.82  0.77  CALCIUM 9.0 8.8* 8.6* 8.4*  MG 1.8 1.8  --  2.2  PHOS 3.6 2.8  --  4.6    GFR: Estimated Creatinine Clearance: 96.4 mL/min (by C-G formula based on SCr of 0.77 mg/dL).  Liver Function Tests: Recent Labs  Lab 07/05/23 0135 07/06/23 0053 07/07/23 0231  AST 16 19 34  ALT 12 15 23   ALKPHOS 89 111 103  BILITOT 1.0 2.2* 1.6*  PROT 7.1 6.7 6.8  ALBUMIN 3.5 3.4* 3.2*    CBG: No results for input(s): "GLUCAP" in the last 168 hours.   Recent Results (from the past 240 hour(s))  SARS Coronavirus 2 by RT PCR (hospital order, performed in [facility] hospital lab) *cepheid single result test* Anterior Nasal Swab     Status: None   Collection Time: 07/05/23  2:15 AM   Specimen: Anterior Nasal Swab  Result Value Ref Range Status   SARS Coronavirus 2 by RT PCR NEGATIVE NEGATIVE Final    Comment: Performed at [facility] Lab, [address] [place], [place] [address]  Respiratory (~20 pathogens) panel by PCR     Status: None   Collection Time: 07/05/23  2:15 AM   Specimen: Nasopharyngeal Swab; Respiratory  Result Value Ref Range Status   Adenovirus NOT DETECTED NOT  DETECTED Final   Coronavirus 229E NOT DETECTED NOT DETECTED Final    Comment: (NOTE) The Coronavirus on the Respiratory Panel, DOES NOT test for the novel  Coronavirus (2019 nCoV)    Coronavirus HKU1 NOT DETECTED NOT DETECTED Final   Coronavirus NL63 NOT DETECTED NOT DETECTED Final   Coronavirus OC43 NOT DETECTED NOT DETECTED Final   Metapneumovirus NOT DETECTED NOT DETECTED Final   Rhinovirus / Enterovirus NOT DETECTED NOT DETECTED Final   Influenza Hughie Melroy NOT DETECTED NOT DETECTED Final   Influenza B NOT DETECTED NOT DETECTED Final   Parainfluenza Virus 1 NOT DETECTED NOT DETECTED Final   Parainfluenza Virus 2 NOT DETECTED NOT DETECTED Final   Parainfluenza Virus 3 NOT DETECTED NOT DETECTED Final   Parainfluenza Virus 4 NOT DETECTED NOT DETECTED Final   Respiratory Syncytial Virus NOT DETECTED NOT DETECTED Final   Bordetella pertussis NOT DETECTED NOT DETECTED Final   Bordetella Parapertussis NOT DETECTED NOT DETECTED Final   Chlamydophila pneumoniae NOT DETECTED NOT DETECTED Final   Mycoplasma pneumoniae NOT DETECTED NOT DETECTED Final    Comment: Performed at [facility] Lab, [address], [place], [place] [address]  Culture, blood (Routine X 2) w Reflex to ID Panel     Status: None (Preliminary result)   Collection Time: 07/07/23  9:25 AM   Specimen: BLOOD LEFT ARM  Result Value Ref Range Status   Specimen Description BLOOD LEFT ARM  Final   Special Requests   Final    BOTTLES DRAWN AEROBIC AND ANAEROBIC Blood Culture adequate volume   Culture   Final    NO GROWTH < 24 HOURS Performed at [facility], [address] [place], [place] [address]    Report Status PENDING  Incomplete  Culture, blood (Routine X 2) w Reflex to ID Panel     Status: None (Preliminary result)   Collection Time: 07/07/23  9:26 AM   Specimen: BLOOD LEFT ARM  Result Value Ref Range Status   Specimen Description BLOOD LEFT ARM  Final   Special Requests   Final    BOTTLES DRAWN AEROBIC AND  ANAEROBIC Blood Culture results may not be optimal due to an excessive  volume of blood received in culture bottles   Culture   Final    NO GROWTH < 24 HOURS Performed at [facility], [address] [place], [place] [address]    Report Status PENDING  Incomplete         Radiology Studies: CT CHEST ABDOMEN PELVIS W CONTRAST  Result Date: 07/08/2023 CLINICAL DATA:  Sepsis. EXAM: CT CHEST, ABDOMEN, AND PELVIS WITH CONTRAST TECHNIQUE: Multidetector CT imaging of the chest, abdomen and pelvis was performed following the standard protocol during bolus administration of intravenous contrast. RADIATION DOSE REDUCTION: This exam was performed according to the departmental dose-optimization program which includes automated exposure control, adjustment of the mA and/or kV according to patient size and/or use of iterative reconstruction technique. CONTRAST:  75mL OMNIPAQUE IOHEXOL 350 MG/ML SOLN COMPARISON:  06/03/2018, 06/05/2023. FINDINGS: CT CHEST FINDINGS Cardiovascular: Heart is mildly enlarged and there is no pericardial effusion. The aorta and pulmonary trunk are normal in caliber. Mediastinum/Nodes: Navy Belay prominent lymph node is noted in the right paratracheal space measuring 1.0 cm. Enlarged hilar lymph nodes are present bilaterally, likely reactive. No axillary lymphadenopathy. Thyroid gland, trachea, and esophagus are within normal limits. Lungs/Pleura: Patchy infiltrates are noted in the lungs bilaterally, greater on the left than on the right. There is consolidation in the left upper lobe and right middle lobe. No effusion or pneumothorax. Musculoskeletal: Right shoulder arthroplasty changes are noted. Mild degenerative changes are present in the thoracic spine. No acute osseous abnormality. CT ABDOMEN PELVIS FINDINGS Hepatobiliary: Hypodensity is present in the posterior right lobe of the liver, statistically most likely representing cysts or hemangioma. The gallbladder is surgically absent. No  biliary ductal dilatation. Pancreas: Unremarkable. No pancreatic ductal dilatation or surrounding inflammatory changes. Spleen: The spleen is enlarged at 15.2 cm. Adrenals/Urinary Tract: No adrenal nodule or mass. The kidneys enhance symmetrically. No renal calculus or hydronephrosis. The bladder is unremarkable. Stomach/Bowel: Stomach is within normal limits. Appendix appears normal. No evidence of bowel wall thickening, distention, or inflammatory changes. No free air or pneumatosis. Vascular/Lymphatic: No significant vascular findings are present. No enlarged abdominal or pelvic lymph nodes. Reproductive: Uterus and bilateral adnexa are unremarkable. Other: No abdominopelvic ascites. Musculoskeletal: Avascular necrosis is noted in the femoral heads bilaterally. No acute osseous abnormality. IMPRESSION: 1. Patchy airspace disease in the lungs bilaterally with consolidation in the left upper lobe and right middle lobe, suspicious for pneumonia. 2. Splenomegaly. 3. Avascular necrosis of the femoral heads bilaterally. Electronically Signed   By: Thornell Sartorius M.D.   On: 07/08/2023 00:26        Scheduled Meds:  ARIPiprazole  10 mg Oral QHS   cyanocobalamin  1,000 mcg Intramuscular Daily   ferrous sulfate  325 mg Oral Q breakfast   folic acid  1 mg Oral Daily   ketorolac  15 mg Intravenous Q6H   pantoprazole  40 mg Oral Daily   polyethylene glycol  17 g Oral Daily   [START ON 07/09/2023] predniSONE  40 mg Oral Q breakfast   sertraline  100 mg Oral Daily   Continuous Infusions:  sodium chloride Stopped (07/08/23 0848)   cefTRIAXone (ROCEPHIN)  IV Stopped (07/08/23 1654)   doxycycline (VIBRAMYCIN) IV Stopped (07/08/23 1654)   lactated ringers 125 mL/hr at 07/08/23 1252     LOS: 3 days    Time spent: 35 min critical care time with anemia requiring transfusion    Lacretia Nicks, MD [facility]   To contact the attending provider between 7A-7P or the  covering provider during after  hours 7P-7A, please log into the web site [email or web address] and access using universal [place] password for that web site. If you do not have the password, please call the hospital operator.  07/08/2023, 5:27 PM

## 2023-07-08 NOTE — Progress Notes (Signed)
IP PROGRESS NOTE  Subjective:   Tina Mayo reports improvement in dyspnea.  She continues to have left-sided chest discomfort.  The pain is present with movement.  No other complaint.  Objective: Vital signs in last 24 hours: Blood pressure 107/62, pulse 82, temperature 99 F (37.2 C), temperature source Oral, resp. rate 18, height 5\' 7"  (1.702 m), weight 210 lb (95.3 kg), last menstrual period 01/12/2017, SpO2 92%.  Intake/Output from previous day: 07/29 0701 - 07/30 0700 In: 2210.3 [P.O.:680; Blood:414; IV Piggyback:1116.3] Out: -   Physical Exam:  Lungs: Decreased breath sounds with inspiratory rales at the lower posterior chest bilaterally, no respiratory distress Cardiac: Regular rate and rhythm Abdomen: Soft, no hepatosplenomegaly Extremities: No leg edema    Lab Results: Recent Labs    07/06/23 0053 07/07/23 0231 07/07/23 0931 07/07/23 1348 07/07/23 1739  WBC 12.4* 15.7*  --   --   --   HGB 8.9* 7.2*   < > 6.6* 8.4*  HCT 24.5* 20.0*   < > 18.8* 23.7*  PLT 61* 45*  --   --   --    < > = values in this interval not displayed.    BMET Recent Labs    07/06/23 0053 07/07/23 0231  NA 135 136  K 3.5 3.6  CL 104 105  CO2 22 22  GLUCOSE 99 97  BUN 5* 11  CREATININE 0.80 0.82  CALCIUM 8.8* 8.6*    No results found for: "CEA1", "CEA", "NWG956", "CA125"  Studies/Results: CT CHEST ABDOMEN PELVIS W CONTRAST  Result Date: 07/08/2023 CLINICAL DATA:  Sepsis. EXAM: CT CHEST, ABDOMEN, AND PELVIS WITH CONTRAST TECHNIQUE: Multidetector CT imaging of the chest, abdomen and pelvis was performed following the standard protocol during bolus administration of intravenous contrast. RADIATION DOSE REDUCTION: This exam was performed according to the departmental dose-optimization program which includes automated exposure control, adjustment of the mA and/or kV according to patient size and/or use of iterative reconstruction technique. CONTRAST:  75mL OMNIPAQUE IOHEXOL 350 MG/ML  SOLN COMPARISON:  06/03/2018, 06/05/2023. FINDINGS: CT CHEST FINDINGS Cardiovascular: Heart is mildly enlarged and there is no pericardial effusion. The aorta and pulmonary trunk are normal in caliber. Mediastinum/Nodes: A prominent lymph node is noted in the right paratracheal space measuring 1.0 cm. Enlarged hilar lymph nodes are present bilaterally, likely reactive. No axillary lymphadenopathy. Thyroid gland, trachea, and esophagus are within normal limits. Lungs/Pleura: Patchy infiltrates are noted in the lungs bilaterally, greater on the left than on the right. There is consolidation in the left upper lobe and right middle lobe. No effusion or pneumothorax. Musculoskeletal: Right shoulder arthroplasty changes are noted. Mild degenerative changes are present in the thoracic spine. No acute osseous abnormality. CT ABDOMEN PELVIS FINDINGS Hepatobiliary: Hypodensity is present in the posterior right lobe of the liver, statistically most likely representing cysts or hemangioma. The gallbladder is surgically absent. No biliary ductal dilatation. Pancreas: Unremarkable. No pancreatic ductal dilatation or surrounding inflammatory changes. Spleen: The spleen is enlarged at 15.2 cm. Adrenals/Urinary Tract: No adrenal nodule or mass. The kidneys enhance symmetrically. No renal calculus or hydronephrosis. The bladder is unremarkable. Stomach/Bowel: Stomach is within normal limits. Appendix appears normal. No evidence of bowel wall thickening, distention, or inflammatory changes. No free air or pneumatosis. Vascular/Lymphatic: No significant vascular findings are present. No enlarged abdominal or pelvic lymph nodes. Reproductive: Uterus and bilateral adnexa are unremarkable. Other: No abdominopelvic ascites. Musculoskeletal: Avascular necrosis is noted in the femoral heads bilaterally. No acute osseous abnormality. IMPRESSION: 1. Patchy airspace  disease in the lungs bilaterally with consolidation in the left upper lobe and  right middle lobe, suspicious for pneumonia. 2. Splenomegaly. 3. Avascular necrosis of the femoral heads bilaterally. Electronically Signed   By: Thornell Sartorius M.D.   On: 07/08/2023 00:26    Medications: I have reviewed the patient's current medications.  Assessment/Plan: Chest pain Admission CT chest suggestive of pneumonia CTs 07/07/2023-bilateral airspace disease with left upper lobe and right middle lobe consolidation, splenomegaly, avascular necrosis of the femoral heads Anemia Thrombocytopenia Sickle cell trait Tobacco use Family history of sickle cell disease Family history of breast cancer Mild mediastinal/hilar lymphadenopathy-present on a previous CT B12 deficiency History of avascular necrosis-avascular necrosis of the femoral heads on CT 07/07/2023   Tina Mayo appears stable.  The hemoglobin is higher after the Red cell transfusion yesterday.  The platelets are improved.  She has persistent leukocytosis, likely secondary to pneumonia and steroids.  Tina Mayo appears to have pneumonia.  This is likely responsible for her clinical presentation.  Pain may be related to pleurisy.  I remain concerned she has an underlying hemoglobinopathy which could be responsible for "crisis "pain and the avascular necrosis.  Recommendations: Continue supportive care with intravenous hydration, antibiotics, oxygen, and analgesics Follow-up hemoglobin electrophoresis, check thalassemia gene panel if the hemoglobin electrophoresis is negative I will check on her 07/10/2023, please call as needed   LOS: 3 days   Thornton Papas, MD   07/08/2023, 7:24 AM

## 2023-07-09 DIAGNOSIS — J189 Pneumonia, unspecified organism: Secondary | ICD-10-CM | POA: Diagnosis not present

## 2023-07-09 LAB — HGB FRAC BY HPLC+SOLUBILITY
Hgb A2: 3.4 % — ABNORMAL HIGH (ref 1.8–3.2)
Hgb A: 0 % — ABNORMAL LOW (ref 96.4–98.8)
Hgb C: 45.6 % — ABNORMAL HIGH
Hgb E: 0 %
Hgb F: 1.3 % (ref 0.0–2.0)
Hgb S: 49.7 % — ABNORMAL HIGH
Hgb Solubility: POSITIVE — AB
Hgb Variant: 0 %

## 2023-07-09 LAB — HGB FRACTIONATION CASCADE

## 2023-07-09 MED ORDER — CYANOCOBALAMIN 1000 MCG/ML IJ SOLN
1000.0000 ug | Freq: Every day | INTRAMUSCULAR | Status: DC
  Administered 2023-07-10: 1000 ug via SUBCUTANEOUS
  Filled 2023-07-09: qty 1

## 2023-07-09 MED ORDER — ACETAMINOPHEN 325 MG PO TABS: 650.0000 mg | ORAL_TABLET | Freq: Four times a day (QID) | ORAL | Status: DC | PRN

## 2023-07-09 NOTE — Plan of Care (Signed)

## 2023-07-09 NOTE — TOC CM/SW Note (Signed)
Transition of Care [facility]) - Inpatient Brief Assessment   Patient Details  Name: Meschelle Sass MRN: [id number] Date of Birth: 1970/12/24  Transition of Care [facility]) CM/SW Contact:    Tom-Johnson, Hershal Coria, RN Phone Number: 07/09/2023, 3:16 PM   Clinical Narrative:  Patient presented to the ED with Substernal pain. Found to have Community Acquired Pneumonia, on IV abx.   From home alone, has four children who lives out of town. Currently employed. Independent with care prior to admission. Requests a Cab at discharge.  PCP is Fleet Contras, MD and uses [facility] on [address]  No PT/OT f/u noted.   CM will continue to follow as patient progresses with care towards discharge.           Transition of Care Asessment: Insurance and Status: Insurance coverage has been reviewed Patient has primary care physician: Yes Home environment has been reviewed: Yes Prior level of function:: Independent Prior/Current Home Services: No current home services Social Determinants of Health Reivew: SDOH reviewed no interventions necessary Readmission risk has been reviewed: Yes Transition of care needs: transition of care needs identified, TOC will continue to follow

## 2023-07-09 NOTE — Progress Notes (Signed)
Colisha Kolm  WGN:[id number] DOB: Jul 30, 1971 DOA: 07/05/2023 PCP: Pcp, No    Brief Narrative:  52 year old with a history of chronic iron deficiency anemia, chronic thrombocytopenia, and reported sickle cell trait who was admitted to [facility] 7/27 with community-acquired pneumonia and associated atypical chest pain.  Goals of Care:   Code Status: Full Code   DVT prophylaxis: Place and maintain sequential compression device Start: 07/05/23 1636 SCDs Start: 07/05/23 0442  Interim Hx: Afebrile.  Vital signs stable.  Resting comfortably in bed at the time of my evaluation.  Denies chest pain or abdominal pain at this time.  Assessment & Plan:  Atypical chest pain Previous physician was not convinced that symptoms were consistent with pneumonia alone -troponin negative x 2 -CTa chest negative for PE -TTE with preserved systolic function and no WMA  Bilateral lower lobe pneumonia CT chest noted patchy airspace disease bilaterally with more focal consolidation in the left upper lobe and right middle lobe -being treated with empiric antibiotic -negative respiratory virus panel and negative COVID -MRSA negative -consider outpatient pulmonary follow-up if follow-up CXR does not fully clear  Sickle cell trait? 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 Truett Perna during this hospital stay -transfused 1 unit PRBC during this admission -recheck CBC in a.m.  B12 deficiency Continue B12 supplementation  Rheumatoid arthritis Not on any DMARDS -to complete slow steroid taper  after discharge -anti-CCP + 05/25/2021 with negative RF and negative ANA at that time -consider outpatient rheumatology follow-up  Mild bilateral hilar and mediastinal lymphadenopathy Appears to be chronic -ACE level normal -will need reevaluation with follow-up CXR as outpatient  Chronic right-sided Bell's palsy x 12 years  Obesity - Body mass index is 32.89 kg/m.   Family Communication: No family present at time of exam Disposition: Anticipate possible discharge home 8/1   Objective: Blood pressure (!) 104/55, pulse 79, temperature 98.3 F (36.8 C), resp. rate 18, height 5\' 7"  (1.702 m), weight 95.3 kg, last menstrual period 01/12/2017, SpO2 95%.  Intake/Output Summary (Last 24 hours) at 07/09/2023 0927 Last data filed at 07/09/2023 0454 Gross per 24 hour  Intake 470.4 ml  Output 0 ml  Net 470.4 ml   Filed Weights   07/05/23 0128  Weight: 95.3 kg    Examination: General: No acute respiratory distress Lungs: Clear to auscultation bilaterally without wheezes or crackles Cardiovascular: Regular rate and rhythm without murmur gallop or rub normal S1 and S2 Abdomen: Nontender, nondistended, soft, bowel sounds positive, no rebound, no ascites, no appreciable mass Extremities: No significant cyanosis, clubbing, or edema bilateral lower extremities  CBC: Recent Labs  Lab 07/06/23 0053 07/07/23 0231 07/07/23 0931 07/07/23 1348 07/07/23 1739 07/08/23 0809  WBC 12.4* 15.7*  --   --   --  27.8*  NEUTROABS 10.2* 10.1*  --   --   --   --   HGB 8.9* 7.2*   < > 6.6* 8.4* 7.8*  HCT 24.5* 20.0*   < > 18.8* 23.7* 21.7*  MCV 78.0* 82.3  --   --   --  81.3  PLT 61* 45*  --   --   --  78*   < > = values in this interval not displayed.   Basic Metabolic Panel: Recent Labs  Lab 07/05/23 0135 07/06/23 0053 07/07/23 0231 07/08/23 0809  NA 138 135 136 136  K 3.7 3.5 3.6 4.1  CL 104 104 105 106  CO2 23 22 22  21*  GLUCOSE 116* 99 97 105*  BUN <5* 5* 11 13  CREATININE 0.75 0.80  0.82 0.77  CALCIUM 9.0 8.8* 8.6* 8.4*  MG 1.8 1.8  --  2.2  PHOS 3.6 2.8  --  4.6   GFR: Estimated Creatinine Clearance: 96.4 mL/min (by C-G formula based on SCr of 0.77 mg/dL).   Scheduled Meds:  ARIPiprazole  10 mg Oral QHS   cyanocobalamin  1,000 mcg Intramuscular Daily   ferrous sulfate  325 mg Oral Q breakfast   folic acid  1 mg Oral Daily   ketorolac  15 mg Intravenous Q6H   pantoprazole  40 mg Oral Daily   polyethylene glycol  17 g Oral Daily   predniSONE  40 mg Oral Q breakfast   Followed by   Melene Muller ON 07/12/2023] predniSONE  30 mg Oral Q breakfast   Followed by   Melene Muller ON 07/15/2023] predniSONE  20 mg Oral Q breakfast   Followed by   Melene Muller ON 07/18/2023] predniSONE  10 mg Oral Q breakfast   sertraline  100 mg Oral Daily   Continuous Infusions:  cefTRIAXone (ROCEPHIN)  IV 2 g (07/09/23 0907)   doxycycline (VIBRAMYCIN) IV 100 mg (07/08/23 2255)   lactated ringers 125 mL/hr at 07/09/23 0755     LOS: 4 days   Lonia Blood, MD [facility] Office  [phone] Pager - Text Page per Loretha Stapler  If 7PM-7AM, please contact night-coverage per Amion 07/09/2023, 9:27 AM

## 2023-07-10 ENCOUNTER — Other Ambulatory Visit (HOSPITAL_COMMUNITY): Payer: Self-pay

## 2023-07-10 ENCOUNTER — Other Ambulatory Visit: Payer: Self-pay | Admitting: *Deleted

## 2023-07-10 DIAGNOSIS — J189 Pneumonia, unspecified organism: Secondary | ICD-10-CM | POA: Diagnosis not present

## 2023-07-10 DIAGNOSIS — D649 Anemia, unspecified: Secondary | ICD-10-CM | POA: Diagnosis not present

## 2023-07-10 DIAGNOSIS — D696 Thrombocytopenia, unspecified: Secondary | ICD-10-CM

## 2023-07-10 MED ORDER — FERROUS SULFATE 325 (65 FE) MG PO TABS: 325.0000 mg | ORAL_TABLET | Freq: Every day | ORAL | Status: AC

## 2023-07-10 MED ORDER — FOLIC ACID 1 MG PO TABS
1.0000 mg | ORAL_TABLET | Freq: Every day | ORAL | 0 refills | Status: AC
  Filled 2023-07-10: qty 90, 90d supply, fill #0

## 2023-07-10 MED ORDER — VITAMIN B-12 1000 MCG PO TABS
1000.0000 ug | ORAL_TABLET | Freq: Every day | ORAL | 0 refills | Status: AC
  Filled 2023-07-10: qty 130, 130d supply, fill #0

## 2023-07-10 MED ORDER — ACETAMINOPHEN 325 MG PO TABS: 650.0000 mg | ORAL_TABLET | Freq: Four times a day (QID) | ORAL | Status: AC | PRN

## 2023-07-10 MED ORDER — OXYCODONE HCL 5 MG PO TABS
5.0000 mg | ORAL_TABLET | ORAL | 0 refills | Status: DC | PRN
  Filled 2023-07-10: qty 20, 4d supply, fill #0

## 2023-07-10 NOTE — Discharge Summary (Signed)
DISCHARGE SUMMARY  Tina Mayo  MR#: [id number]  DOB:11-24-1971  Date of Admission: 07/05/2023 Date of Discharge: 07/10/2023  Attending Physician:Dessire Grimes Silvestre Gunner, MD  Patient's WUJ:[id number], [name], MD  Consults: Hematology   Disposition: D/C home   Follow-up Appts:  Follow-up Information     Fleet Contras, MD Follow up in 3 day(s).   Specialty: Internal Medicine Why: You need to have a blood count checked in 3 days, as we discussed. This is VERY IMPORTANT. Contact information: [address] [place] [address] [phone]                 Tests Needing Follow-up: -recheck CXR in 2-3 weeks to determine if infiltrates and mediastinal/hilar adenopathy have cleared  -outpt referral to comprehensive sickle cell tx clinic  -f/u results of Hgb electrophoresis pending at time of d/c  -f/u CBC in 3-5 days as outpt -recheck B12 level in 4-6 weeks on oral tx -consider outpatient Rheumatology follow-up of reported RA  Discharge Diagnoses: Atypical chest pain Bilateral lower lobe pneumonia Sickle cell trait v/s Sickle cell disease  Anemia of chronic disease -chronic thrombocytopenia B12 deficiency Rheumatoid arthritis Mild bilateral hilar and mediastinal lymphadenopathy Chronic right-sided Bell's palsy x 12 years Obesity - Body mass index is 32.89 kg/m.  Initial presentation: 52 year old with a history of chronic iron deficiency anemia, chronic thrombocytopenia, and reported sickle cell trait who was admitted to [facility] 7/27 with community-acquired pneumonia and associated atypical chest pain.   Hospital Course:  Atypical chest pain The medical team was not convinced that symptoms were consistent with pneumonia alone -troponin negative x 2 -CTa chest negative for PE -TTE with preserved systolic function and no WMA - possible component of true sickle cell disease?   Bilateral lower lobe pneumonia CT chest noted patchy airspace disease  bilaterally with more focal consolidation in the left upper lobe and right middle lobe -treated with empiric antibiotic -negative respiratory virus panel and negative COVID -MRSA negative -consider outpatient Pulmonary follow-up if follow-up CXR does not fully clear   Sickle cell trait? 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 Truett Perna during this hospital stay -transfused 1 unit PRBC during this admission - f/u CBC in 3-5 days as outpt (discussed need with patient, and suggested seeing her PCP for this)   B12 deficiency Continue B12 supplementation - recheck B12 level in 4-6 weeks on oral tx    Rheumatoid arthritis Not on any DMARDS - steroid course completed prior to discharge - anti-CCP + 05/25/2021 with negative RF and negative ANA at that time -consider outpatient Rheumatology follow-up   Mild bilateral hilar and mediastinal lymphadenopathy Appears to be chronic -ACE level normal -will need reevaluation with follow-up CXR as outpatient   Chronic right-sided Bell's palsy x 12 years   Obesity - Body mass index is 32.89 kg/m.  Allergies as of 07/10/2023       Reactions   Levaquin [levofloxacin] Anaphylaxis   Ca Phosphate-cholecalciferol Hives   Cvs Yogurt + Calcium [ca Phosphate-cholecalciferol] Other (See Comments)  Unknown reaction   Origanum Oil Hives   Penicillins Swelling        Medication List     STOP taking these medications    ibuprofen 800 MG  tablet Commonly known as: ADVIL   zolpidem 10 MG tablet Commonly known as: AMBIEN       TAKE these medications    acetaminophen 325 MG tablet Commonly known as: TYLENOL Take 2 tablets (650 mg total) by mouth every 6 (six) hours as needed for mild pain, fever or headache.   ARIPiprazole 10 MG tablet Commonly known as: ABILIFY Take 1 tablet (10 mg total) by mouth at bedtime. What changed: Another medication with the same name was removed. Continue taking this medication, and follow the directions you see here.   cyanocobalamin 1000 MCG tablet Commonly known as: VITAMIN B12 Take 1 tablet (1,000 mcg total) by mouth daily.   ergocalciferol 1.25 MG (50000 UT) capsule Commonly known as: VITAMIN D2 Take 50,000 Units by mouth once a week.   ferrous sulfate 325 (65 FE) MG tablet Commonly known as: FeroSul Take 1 tablet (325 mg total) by mouth daily with breakfast. Start taking on: July 11, 2023 What changed:  medication strength when to take this   folic acid 1 MG tablet Commonly known as: FOLVITE Take 1 tablet (1 mg total) by mouth daily. Start taking on: July 11, 2023   oxyCODONE 5 MG immediate release tablet Commonly known as: Oxy IR/ROXICODONE Take 1 tablet (5 mg total) by mouth every 4 (four) hours as needed for severe pain.   sertraline 100 MG tablet Commonly known as: ZOLOFT Take 100 mg by mouth daily.        Day of Discharge BP 121/65 (BP Location: Left Arm)   Pulse 69   Temp 98.6 F (37 C)   Resp 18   Ht 5\' 7"  (1.702 m)   Wt 95.3 kg   LMP 01/12/2017   SpO2 97%   BMI 32.89 kg/m   Physical Exam: General: No acute respiratory distress Lungs: Faint scattered crackles with good air movement throughout with no wheezing Cardiovascular: Regular rate and rhythm without murmur gallop or rub normal S1 and S2 Abdomen: Nontender, nondistended, soft, bowel sounds positive, no rebound, no ascites, no appreciable mass Extremities: No significant cyanosis,  clubbing, or edema bilateral lower extremities  Basic Metabolic Panel: Recent Labs  Lab 07/05/23 0135 07/06/23 0053 07/07/23 0231 07/08/23 0809  NA 138 135 136 136  K 3.7 3.5 3.6 4.1  CL 104 104 105 106  CO2 23 22 22  21*  GLUCOSE 116* 99 97 105*  BUN <5* 5* 11 13  CREATININE 0.75 0.80 0.82 0.77  CALCIUM 9.0 8.8* 8.6* 8.4*  MG 1.8 1.8  --  2.2  PHOS 3.6 2.8  --  4.6    CBC: Recent Labs  Lab 07/05/23 0135 07/06/23 0053 07/07/23 0231 07/07/23 0931 07/07/23 1348 07/07/23 1739 07/08/23 0809 07/10/23 0138  WBC 13.2* 12.4* 15.7*  --   --   --  27.8* 24.7*  NEUTROABS  --  10.2* 10.1*  --   --   --   --   --   HGB 10.2* 8.9* 7.2* 7.0* 6.6* 8.4* 7.8* 7.0*  HCT 28.3* 24.5* 20.0* 19.7* 18.8* 23.7* 21.7* 19.8*  MCV 81.1 78.0* 82.3  --   --   --  81.3 83.2  PLT 82* 61* 45*  --   --   --  78* 90*    Time spent in discharge (  includes decision making & examination of pt): 35 minutes  07/10/2023, 11:48 AM   Lonia Blood, MD [facility] Office  [phone]

## 2023-07-10 NOTE — Progress Notes (Addendum)
IP PROGRESS NOTE  Subjective:   Ms. Surace reports feeling much better compared to hospital admission.  She continues to have left shoulder discomfort.  Dyspnea is improved.  She would like to go home.  Objective: Vital signs in last 24 hours: Blood pressure 109/66, pulse 71, temperature 98.5 F (36.9 C), resp. rate 18, height 5\' 7"  (1.702 m), weight 210 lb (95.3 kg), last menstrual period 01/12/2017, SpO2 95%.  Intake/Output from previous day: 07/31 0701 - 08/01 0700 In: 4130.6 [P.O.:1200; I.V.:2430.6; IV Piggyback:500] Out: 0   Physical Exam:  Lungs: Scattered end inspiratory rhonchi at the lower posterior chest and mild bilateral expiratory wheeze, no respiratory distress Cardiac: Regular rate and rhythm Abdomen: Soft, no hepatosplenomegaly Extremities: No leg edema    Lab Results: Recent Labs    07/08/23 0809 07/10/23 0138  WBC 27.8* 24.7*  HGB 7.8* 7.0*  HCT 21.7* 19.8*  PLT 78* 90*    BMET Recent Labs    07/08/23 0809  NA 136  K 4.1  CL 106  CO2 21*  GLUCOSE 105*  BUN 13  CREATININE 0.77  CALCIUM 8.4*    No results found for: "CEA1", "CEA", "ZOX096", "CA125"  Studies/Results: No results found.  Medications: I have reviewed the patient's current medications.  Assessment/Plan: Chest pain Admission CT chest suggestive of pneumonia CTs 07/07/2023-bilateral airspace disease with left upper lobe and right middle lobe consolidation, splenomegaly, avascular necrosis of the femoral heads Anemia Thrombocytopenia Sickle cell disease Hemoglobin [place]-electrophoresis 11/05/2023: Hemoglobin S 49.7, hemoglobin C 45.6, hemoglobin F 1.3 Tobacco use Family history of sickle cell disease Family history of breast cancer Mild mediastinal/hilar lymphadenopathy-present on a previous CT B12 deficiency History of avascular necrosis-avascular necrosis of the femoral heads on CT 07/07/2023  12.  Splenomegaly on CT 07/07/2023  Ms. Bettenhausen appears improved.  The  hemoglobin electrophoresis confirmed hemoglobin Jay disease.  The hemoglobin [place] likely explains the avascular necrosis and thrombocytopenia.  Her current clinical presentation is likely in part related to a sickle cell crisis.  The lung findings on CT are likely in part related to COPD, sickle cell lung disease, and pneumonia. I explained the hemoglobin [place] diagnosis to Ms. Godbey.  She will benefit from referral to a comprehensive sickle cell program. She may be a candidate for hydroxyurea therapy.   Recommendations: Continue supportive care with intravenous hydration, antibiotics, oxygen, and analgesics Transfuse packed red blood cells as needed for symptomatic anemia Follow-up with Dr. Concepcion Elk within a few days of discharge to check the hemoglobin and refer to a comprehensive sickle cell program. Continue B12 and folic acid supplementation Discontinue prednisone Please call hematology as needed   LOS: 5 days   Thornton Papas, MD   07/10/2023, 6:53 AM

## 2023-07-10 NOTE — Progress Notes (Signed)
   07/10/23   To Whom it may concern,  Tina Mayo was hospitalized at [facility] from 07/05/2023 through 07/10/2023.  She has been cleared to return to work on but not before 07/17/2023 with no medical restrictions.    Should you have further questions please feel free to contact our office.  Be advised that no medical information will be divulged without a signed HIPA release from the patient.    Sincerely,  Lonia Blood, MD [facility] Office  [phone]

## 2023-07-10 NOTE — Progress Notes (Signed)
Physical Therapy Treatment Patient Details Name: Tina Mayo MRN: [id number] DOB: [birth year] Today's Date: 07/10/2023   History of Present Illness Patient is a 52 yo woman that presents to the hospital for c/o chest pain and found to have community-aquired pneumonia.  PMHx: anemia, CAD, depression, thrombocytopenia    PT Comments  Pt demonstrates independence with bed mobility, transfers, and ambulation 350' without AD. Supervision ascend/descend 22 steps with R rail. Max HR 131. SpO2 93% on RA. 1/4 DOE. PT to continue to follow acutely to maximize mobility to ensure safe d/c home. No follow up services indicated. No DME needs.     If plan is discharge home, recommend the following:     Can travel by private vehicle        Equipment Recommendations  None recommended by PT    Recommendations for Other Services       Precautions / Restrictions Precautions Precautions: Other (comment) Precaution Comments: tachycardia     Mobility  Bed Mobility Overal bed mobility: Independent                  Transfers Overall transfer level: Independent Equipment used: None                    Ambulation/Gait Ambulation/Gait assistance: Modified independent (Device/Increase time) Gait Distance (Feet): 350 Feet Assistive device: None Gait Pattern/deviations: Step-through pattern Gait velocity: WFL Gait velocity interpretation: >2.62 ft/sec, indicative of community ambulatory   General Gait Details: steady gait without AD   Stairs Stairs: Yes Stairs assistance: Supervision Stair Management: One rail Right, Alternating pattern, Forwards Number of Stairs: 22 General stair comments: 1/4 DOE. Max HR 131. SpO2 93% on RA   Wheelchair Mobility     Tilt Bed    Modified Rankin (Stroke Patients Only)       Balance Overall balance assessment: No apparent balance deficits (not formally assessed)                                           Cognition Arousal/Alertness: Awake/alert Behavior During Therapy: WFL for tasks assessed/performed Overall Cognitive Status: Within Functional Limits for tasks assessed                                          Exercises      General Comments General comments (skin integrity, edema, etc.): After stairs/amb, max HR 131, SpO2 93% on RA, 1/4 DOE. Reviewed inspirometer use.      Pertinent Vitals/Pain Pain Assessment Pain Assessment: Faces Faces Pain Scale: Hurts a little bit Pain Location: L chest into shoulder Pain Descriptors / Indicators: Discomfort Pain Intervention(s): Monitored during session, Repositioned    Home Living                          Prior Function            PT Goals (current goals can now be found in the care plan section) Acute Rehab PT Goals Patient Stated Goal: home Progress towards PT goals: Progressing toward goals    Frequency    Min 1X/week      PT Plan Current plan remains appropriate    Co-evaluation  AM-PAC PT "6 Clicks" Mobility   Outcome Measure  Help needed turning from your back to your side while in a flat bed without using bedrails?: None Help needed moving from lying on your back to sitting on the side of a flat bed without using bedrails?: None Help needed moving to and from a bed to a chair (including a wheelchair)?: None Help needed standing up from a chair using your arms (e.g., wheelchair or bedside chair)?: None Help needed to walk in hospital room?: None Help needed climbing 3-5 steps with a railing? : A Little 6 Click Score: 23    End of Session   Activity Tolerance: Patient tolerated treatment well Patient left: in bed;with nursing/sitter in room;with call bell/phone within reach Nurse Communication: Mobility status PT Visit Diagnosis: Other abnormalities of gait and mobility (R26.89)     Time: 1610-9604 PT Time Calculation (min) (ACUTE ONLY): 30 min  Charges:     $Gait Training: 23-37 mins PT General Charges $$ ACUTE PT VISIT: 1 Visit                     Ferd Glassing., PT  Office # [phone]    Ilda Foil 07/10/2023, 10:28 AM

## 2023-07-10 NOTE — TOC Transition Note (Signed)
Transition of Care [facility]) - CM/SW Discharge Note   Patient Details  Name: Tina Mayo MRN: [id number] Date of Birth: November 21, 1971  [facility]) CM/SW Contact:  Tom-Johnson, Hershal Coria, RN Phone Number: 07/10/2023, 12:47 PM   Clinical Narrative:      Patient is scheduled for discharge today.  Readmission Risk Assessment done. Outpatient referral, hospital f/u and discharge instructions on AVS. Prescriptions sent to [facility] pharmacy and meds will be delivered to patient at bedside prior discharge. Patient requested for a cab, Therapist, nutritional and Release of Liability form explained to patient with understanding verbalized, form signed and placed in patient's chart. Cab voucher given to RN. No further TOC needs noted.      Final next level of care: Home/Self Care Barriers to Discharge: Barriers Resolved   Patient Goals and CMS Choice CMS Medicare.gov Compare Post Acute Care list provided to:: Patient Choice offered to / list presented to : NA  Discharge Placement                  Patient to be transferred to facility by: [facility]      Discharge Plan and Services Additional resources added to the After Visit Summary for     Discharge Planning Services: CM Consult Post Acute Care Choice: NA          DME Arranged: N/A DME Agency: NA       HH Arranged: NA HH Agency: NA        Social Determinants of Health (SDOH) Interventions SDOH Screenings   Food Insecurity: No Food Insecurity (07/05/2023)  Housing: Low Risk  (07/05/2023)  Transportation Needs: No Transportation Needs (07/05/2023)  Utilities: Not At Risk (07/05/2023)  Financial Resource Strain: Low Risk  (06/30/2019)  Physical Activity: Unknown (06/30/2019)  Social Connections: Unknown (06/30/2019)  Stress: Stress Concern Present (06/30/2019)  Tobacco Use: High Risk (07/05/2023)     Readmission Risk Interventions    07/09/2023    3:13 PM  Readmission Risk Prevention Plan  Post Dischage Appt  Complete  Medication Screening Complete  Transportation Screening Complete

## 2023-07-10 NOTE — Progress Notes (Signed)
Referral entered for Sickle Cell Clinic per discussion with Dr Truett Perna and Dr Sharon Seller

## 2023-07-22 ENCOUNTER — Telehealth: Payer: Self-pay | Admitting: *Deleted

## 2023-07-23 ENCOUNTER — Other Ambulatory Visit: Payer: Self-pay | Admitting: Orthopedic Surgery

## 2023-07-23 DIAGNOSIS — Z01818 Encounter for other preprocedural examination: Secondary | ICD-10-CM

## 2023-07-29 ENCOUNTER — Other Ambulatory Visit: Payer: Self-pay | Admitting: Internal Medicine

## 2023-07-29 ENCOUNTER — Other Ambulatory Visit: Payer: Medicaid Other

## 2023-07-29 LAB — CBC
HCT: 34.8 % — ABNORMAL LOW (ref 35.0–45.0)
Hemoglobin: 11.2 g/dL — ABNORMAL LOW (ref 11.7–15.5)
MCH: 28.1 pg (ref 27.0–33.0)
MCHC: 32.2 g/dL (ref 32.0–36.0)
MCV: 87.2 fL (ref 80.0–100.0)
MPV: 11 fL (ref 7.5–12.5)
Platelets: 135 10*3/uL — ABNORMAL LOW (ref 140–400)
RBC: 3.99 10*6/uL (ref 3.80–5.10)
RDW: 16.7 % — ABNORMAL HIGH (ref 11.0–15.0)
WBC: 11.2 10*3/uL — ABNORMAL HIGH (ref 3.8–10.8)

## 2023-07-30 ENCOUNTER — Ambulatory Visit
Admission: RE | Admit: 2023-07-30 | Discharge: 2023-07-30 | Disposition: A | Discharge status: Home / Self Care | Payer: Medicaid Other | Source: Ambulatory Visit | Attending: Orthopedic Surgery | Admitting: Orthopedic Surgery

## 2023-07-30 DIAGNOSIS — Z01818 Encounter for other preprocedural examination: Secondary | ICD-10-CM

## 2023-09-18 NOTE — Progress Notes (Addendum)
COVID Vaccine Completed:no  Date of COVID positive in last 90 days: no  PCP - Fleet Contras, MD Cardiologist - n/a  Chest x-ray - 07/05/23 Epic EKG - 07/07/23 Epic Stress Test - n/a ECHO - 07/05/23 Epic Cardiac Cath - n/a Pacemaker/ICD device last checked: n/a Spinal Cord Stimulator: n/a  Bowel Prep - no  Sleep Study - n/a CPAP -   Fasting Blood Sugar - n/a Checks Blood Sugar _____ times a day  Last dose of GLP1 agonist-  N/A GLP1 instructions:  N/A   Last dose of SGLT-2 inhibitors-  N/A SGLT-2 instructions: N/A   Blood Thinner Instructions: n/a Aspirin Instructions: Last Dose:  Activity level: Can go up a flight of stairs and perform activities of daily living without stopping and without symptoms of chest pain or shortness of breath.   Anesthesia review: cardiomegaly, sickle cell trait, asthma  Patient denies shortness of breath, fever, cough and chest pain at PAT appointment  Patient verbalized understanding of instructions that were given to them at the PAT appointment. Patient was also instructed that they will need to review over the PAT instructions again at home before surgery.

## 2023-09-18 NOTE — Patient Instructions (Addendum)
SURGICAL WAITING ROOM VISITATION  Patients having surgery or a procedure may have no more than 2 support people in the waiting area - these visitors may rotate.    Children under the age of 75 must have an adult with them who is not the patient.  Due to an increase in RSV and influenza rates and associated hospitalizations, children ages 30 and under may not visit patients in [facility] hospitals.  If the patient needs to stay at the hospital during part of their recovery, the visitor guidelines for inpatient rooms apply. Pre-op nurse will coordinate an appropriate time for 1 support person to accompany patient in pre-op.  This support person may not rotate.    Please refer to the [facility] website for the visitor guidelines for Inpatients (after your surgery is over and you are in a regular room).    Your procedure is scheduled on: 09/30/23   Report to [facility] Main Entrance    Report to admitting at 10:00 AM   Call this number if you have problems the morning of surgery [phone]   Do not eat food :After Midnight.   After Midnight you may have the following liquids until 9:30 AM DAY OF SURGERY  Water Non-Citrus Juices (without pulp, NO RED-Apple, White grape, White cranberry) Black Coffee (NO MILK/CREAM OR CREAMERS, sugar ok)  Clear Tea (NO MILK/CREAM OR CREAMERS, sugar ok) regular and decaf                             Plain Jell-O (NO RED)                                           Fruit ices (not with fruit pulp, NO RED)                                     Popsicles (NO RED)                                                               Sports drinks like Gatorade (NO RED)               The day of surgery:  Drink ONE (1) Pre-Surgery Clear Ensure at 9:30 AM the morning of surgery. Drink in one sitting. Do not sip.  This drink was given to you during your hospital  pre-op appointment visit. Nothing else to drink after completing the  Pre-Surgery Clear Ensure.           If you have questions, please contact your surgeon's office.   FOLLOW BOWEL PREP AND ANY ADDITIONAL PRE OP INSTRUCTIONS YOU RECEIVED FROM YOUR SURGEON'S OFFICE!!!     Oral Hygiene is also important to reduce your risk of infection.                                    Remember - BRUSH YOUR TEETH THE MORNING OF SURGERY WITH YOUR REGULAR TOOTHPASTE  DENTURES WILL BE REMOVED  PRIOR TO SURGERY PLEASE DO NOT APPLY "Poly grip" OR ADHESIVES!!!   Do NOT smoke after Midnight   Stop all vitamins and herbal supplements 7 days before surgery.   Take these medicines the morning of surgery with A SIP OF WATER: Tylenol, Abilify                               You may not have any metal on your body including hair pins, jewelry, and body piercing             Do not wear make-up, lotions, powders, perfumes, or deodorant  Do not wear nail polish including gel and S&S, artificial/acrylic nails, or any other type of covering on natural nails including finger and toenails. If you have artificial nails, gel coating, etc. that needs to be removed by a nail salon please have this removed prior to surgery or surgery may need to be canceled/ delayed if the surgeon/ anesthesia feels like they are unable to be safely monitored.   Do not shave  48 hours prior to surgery.    Do not bring valuables to the hospital. [facility] IS NOT             RESPONSIBLE   FOR VALUABLES.   Contacts, glasses, dentures or bridgework may not be worn into surgery.   Bring small overnight bag day of surgery.   DO NOT BRING YOUR HOME MEDICATIONS TO THE HOSPITAL. PHARMACY WILL DISPENSE MEDICATIONS LISTED ON YOUR MEDICATION LIST TO YOU DURING YOUR ADMISSION IN THE HOSPITAL!               Please read over the following fact sheets you were given: IF YOU HAVE QUESTIONS ABOUT YOUR PRE-OP INSTRUCTIONS PLEASE CALL [phone] Contras   If you received a COVID test during your pre-op visit  it is requested that you wear a mask when  out in public, stay away from anyone that may not be feeling well and notify your surgeon if you develop symptoms. If you test positive for Covid or have been in contact with anyone that has tested positive in the last 10 days please notify you surgeon.      Pre-operative 5 CHG Bath Instructions   You can play a key role in reducing the risk of infection after surgery. Your skin needs to be as free of germs as possible. You can reduce the number of germs on your skin by washing with CHG (chlorhexidine gluconate) soap before surgery. CHG is an antiseptic soap that kills germs and continues to kill germs even after washing.   DO NOT use if you have an allergy to chlorhexidine/CHG or antibacterial soaps. If your skin becomes reddened or irritated, stop using the CHG and notify one of our RNs at [phone].   Please shower with the CHG soap starting 4 days before surgery using the following schedule:     Please keep in mind the following:  DO NOT shave, including legs and underarms, starting the day of your first shower.   You may shave your face at any point before/day of surgery.  Place clean sheets on your bed the day you start using CHG soap. Use a clean washcloth (not used since being washed) for each shower. DO NOT sleep with pets once you start using the CHG.   CHG Shower Instructions:  If you choose to wash your hair and private area, wash first with your normal  shampoo/soap.  After you use shampoo/soap, rinse your hair and body thoroughly to remove shampoo/soap residue.  Turn the water OFF and apply about 3 tablespoons (45 ml) of CHG soap to a CLEAN washcloth.  Apply CHG soap ONLY FROM YOUR NECK DOWN TO YOUR TOES (washing for 3-5 minutes)  DO NOT use CHG soap on face, private areas, open wounds, or sores.  Pay special attention to the area where your surgery is being performed.  If you are having back surgery, having someone wash your back for you may be helpful. Wait 2 minutes  after CHG soap is applied, then you may rinse off the CHG soap.  Pat dry with a clean towel  Put on clean clothes/pajamas   If you choose to wear lotion, please use ONLY the CHG-compatible lotions on the back of this paper.     Additional instructions for the day of surgery: DO NOT APPLY any lotions, deodorants, cologne, or perfumes.   Put on clean/comfortable clothes.  Brush your teeth.  Ask your nurse before applying any prescription medications to the skin.      CHG Compatible Lotions   Aveeno Moisturizing lotion  Cetaphil Moisturizing Cream  Cetaphil Moisturizing Lotion  Clairol Herbal Essence Moisturizing Lotion, Dry Skin  Clairol Herbal Essence Moisturizing Lotion, Extra Dry Skin  Clairol Herbal Essence Moisturizing Lotion, Normal Skin  Curel Age Defying Therapeutic Moisturizing Lotion with Alpha Hydroxy  Curel Extreme Care Body Lotion  Curel Soothing Hands Moisturizing Hand Lotion  Curel Therapeutic Moisturizing Cream, Fragrance-Free  Curel Therapeutic Moisturizing Lotion, Fragrance-Free  Curel Therapeutic Moisturizing Lotion, Original Formula  Eucerin Daily Replenishing Lotion  Eucerin Dry Skin Therapy Plus Alpha Hydroxy Crme  Eucerin Dry Skin Therapy Plus Alpha Hydroxy Lotion  Eucerin Original Crme  Eucerin Original Lotion  Eucerin Plus Crme Eucerin Plus Lotion  Eucerin TriLipid Replenishing Lotion  Keri Anti-Bacterial Hand Lotion  Keri Deep Conditioning Original Lotion Dry Skin Formula Softly Scented  Keri Deep Conditioning Original Lotion, Fragrance Free Sensitive Skin Formula  Keri Lotion Fast Absorbing Fragrance Free Sensitive Skin Formula  Keri Lotion Fast Absorbing Softly Scented Dry Skin Formula  Keri Original Lotion  Keri Skin Renewal Lotion Keri Silky Smooth Lotion  Keri Silky Smooth Sensitive Skin Lotion  Nivea Body Creamy Conditioning Oil  Nivea Body Extra Enriched Lotion  Nivea Body Original Lotion  Nivea Body Sheer Moisturizing Lotion Nivea  Crme  Nivea Skin Firming Lotion  NutraDerm 30 Skin Lotion  NutraDerm Skin Lotion  NutraDerm Therapeutic Skin Cream  NutraDerm Therapeutic Skin Lotion  ProShield Protective Hand Cream  Provon moisturizing lotion   Incentive Spirometer  An incentive spirometer is a tool that can help keep your lungs clear and active. This tool measures how well you are filling your lungs with each breath. Taking long deep breaths may help reverse or decrease the chance of developing breathing (pulmonary) problems (especially infection) following: A long period of time when you are unable to move or be active. BEFORE THE PROCEDURE  If the spirometer includes an indicator to show your best effort, your nurse or respiratory therapist will set it to a desired goal. If possible, sit up straight or lean slightly forward. Try not to slouch. Hold the incentive spirometer in an upright position. INSTRUCTIONS FOR USE  Sit on the edge of your bed if possible, or sit up as far as you can in bed or on a chair. Hold the incentive spirometer in an upright position. Breathe out normally. Place the mouthpiece in your  mouth and seal your lips tightly around it. Breathe in slowly and as deeply as possible, raising the piston or the ball toward the top of the column. Hold your breath for 3-5 seconds or for as long as possible. Allow the piston or ball to fall to the bottom of the column. Remove the mouthpiece from your mouth and breathe out normally. Rest for a few seconds and repeat Steps 1 through 7 at least 10 times every 1-2 hours when you are awake. Take your time and take a few normal breaths between deep breaths. The spirometer may include an indicator to show your best effort. Use the indicator as a goal to work toward during each repetition. After each set of 10 deep breaths, practice coughing to be sure your lungs are clear. If you have an incision (the cut made at the time of surgery), support your incision when  coughing by placing a pillow or rolled up towels firmly against it. Once you are able to get out of bed, walk around indoors and cough well. You may stop using the incentive spirometer when instructed by your caregiver.  RISKS AND COMPLICATIONS Take your time so you do not get dizzy or light-headed. If you are in pain, you may need to take or ask for pain medication before doing incentive spirometry. It is harder to take a deep breath if you are having pain. AFTER USE Rest and breathe slowly and easily. It can be helpful to keep track of a log of your progress. Your caregiver can provide you with a simple table to help with this. If you are using the spirometer at home, follow these instructions: SEEK MEDICAL CARE IF:  You are having difficultly using the spirometer. You have trouble using the spirometer as often as instructed. Your pain medication is not giving enough relief while using the spirometer. You develop fever of 100.5 F (38.1 C) or higher. SEEK IMMEDIATE MEDICAL CARE IF:  You cough up bloody sputum that had not been present before. You develop fever of 102 F (38.9 C) or greater. You develop worsening pain at or near the incision site. MAKE SURE YOU:  Understand these instructions. Will watch your condition. Will get help right away if you are not doing well or get worse. Document Released: 04/07/2007 Document Revised: 02/17/2012 Document Reviewed: 06/08/2007 Exit[employer] Patient Information 2014 [place] [employer].   ________________________________________________________________________  [facility]- Preparing for Total Shoulder Arthroplasty    Before surgery, you can play an important role. Because skin is not sterile, your skin needs to be as free of germs as possible. You can reduce the number of germs on your skin by using the following products. Benzoyl Peroxide Gel Reduces the number of germs present on the skin Applied twice a day to shoulder area starting two days  before surgery    ==================================================================  Please follow these instructions carefully:  BENZOYL PEROXIDE 5% GEL  Please do not use if you have an allergy to benzoyl peroxide.   If your skin becomes reddened/irritated stop using the benzoyl peroxide.  Starting two days before surgery, apply as follows: Apply benzoyl peroxide in the morning and at night. Apply after taking a shower. If you are not taking a shower clean entire shoulder front, back, and side along with the armpit with a clean wet washcloth.  Place a quarter-sized dollop on your shoulder and rub in thoroughly, making sure to cover the front, back, and side of your shoulder, along with the armpit.  2 days before ____ AM   ____ PM              1 day before ____ AM   ____ PM                         Do this twice a day for two days.  (Last application is the night before surgery, AFTER using the CHG soap as described below).  Do NOT apply benzoyl peroxide gel on the day of surgery.

## 2023-09-19 ENCOUNTER — Encounter (HOSPITAL_COMMUNITY)
Admission: RE | Admit: 2023-09-19 | Discharge: 2023-09-19 | Disposition: A | Discharge status: Home / Self Care | Payer: Medicaid Other | Source: Ambulatory Visit | Attending: Orthopedic Surgery | Admitting: Orthopedic Surgery

## 2023-09-19 ENCOUNTER — Other Ambulatory Visit: Payer: Self-pay

## 2023-09-19 ENCOUNTER — Encounter (HOSPITAL_COMMUNITY): Payer: Self-pay

## 2023-09-19 VITALS — BP 105/84 | HR 87 | Temp 98.5°F | Resp 16 | Ht 67.0 in | Wt 208.2 lb

## 2023-09-19 DIAGNOSIS — I517 Cardiomegaly: Secondary | ICD-10-CM | POA: Insufficient documentation

## 2023-09-19 DIAGNOSIS — Z01812 Encounter for preprocedural laboratory examination: Secondary | ICD-10-CM | POA: Diagnosis not present

## 2023-09-19 DIAGNOSIS — Z01818 Encounter for other preprocedural examination: Secondary | ICD-10-CM | POA: Diagnosis present

## 2023-09-19 HISTORY — DX: Unspecified osteoarthritis, unspecified site: M19.90

## 2023-09-19 LAB — BASIC METABOLIC PANEL
Anion gap: 10 (ref 5–15)
BUN: 6 mg/dL (ref 6–20)
CO2: 21 mmol/L — ABNORMAL LOW (ref 22–32)
Calcium: 9 mg/dL (ref 8.9–10.3)
Chloride: 105 mmol/L (ref 98–111)
Creatinine, Ser: 0.66 mg/dL (ref 0.44–1.00)
GFR, Estimated: >60 mL/min (ref >60)
Glucose, Bld: 83 mg/dL (ref 70–99)
Potassium: 3.9 mmol/L (ref 3.5–5.1)
Sodium: 136 mmol/L (ref 135–145)

## 2023-09-19 LAB — CBC
HCT: 29.9 % — ABNORMAL LOW (ref 36.0–46.0)
Hemoglobin: 10.8 g/dL — ABNORMAL LOW (ref 12.0–15.0)
MCH: 29.3 pg (ref 26.0–34.0)
MCHC: 36.1 g/dL — ABNORMAL HIGH (ref 30.0–36.0)
MCV: 81.3 fL (ref 80.0–100.0)
Platelets: 104 10*3/uL — ABNORMAL LOW (ref 150–400)
RBC: 3.68 MIL/uL — ABNORMAL LOW (ref 3.87–5.11)
RDW: 14.8 % (ref 11.5–15.5)
WBC: 10.8 10*3/uL — ABNORMAL HIGH (ref 4.0–10.5)
nRBC: 0.2 % (ref 0.0–0.2)

## 2023-09-19 LAB — SURGICAL PCR SCREEN
MRSA, PCR: NEGATIVE
Staphylococcus aureus: NEGATIVE

## 2023-09-24 NOTE — Progress Notes (Signed)
Anesthesia Chart Review   Case: [id number] Date/Time: 09/30/23 1215   Procedure: TOTAL SHOULDER ARTHROPLASTY (Left: Shoulder)   Anesthesia type: General   Pre-op diagnosis: left shoulder OA   Location: [facility] ROOM 08 / [facility]   Surgeons: Teryl Lucy, MD       DISCUSSION:52 y.o. smoker with h/o sleep apnea, asthma, sickle cell train, chronic anemia, bell's palsy x 12 years, left shoulder OA scheduled for above procedure 09/30/23 with Dr. Teryl Lucy.   Pt with admission 07/10/23 with community acquired pneumonia and associated atypical chest pain, anemia. Medical team not convinced chest pain associated with pneumonia, possible component of true sickle cell disease, h/o sickle cell trait. Troponin negative x2, CTA chest neg for PE, TTE with preserved systolic function, cardiac etiology ruled out.  She was advised to follow up with sickle cell clinic, but chose to follow with PCP for this.   Pt with chronic anemia, chronic thrombocytopenia. During admission transfused 1 unit PRBC.  Pt had follow up with PCP, labs recheck 07/29/23. With improved hemoglobin of 11.2, platelets 135. PCP clearance requested.   Per PCP clearance, "She was evaluated by me on 08/12/2023 and noted to be in stable condition. I consider her to be at low risk for peri-operative complications. I have discussed this level of risk with her and it is my opinion that you can proceed with the planned surgery."  VS: BP 105/84   Pulse 87   Temp 36.9 C (Oral)   Resp 16   Ht 5\' 7"  (1.702 m)   Wt 94.4 kg   LMP 01/12/2017   SpO2 97%   BMI 32.61 kg/m   PROVIDERS: Fleet Contras, MD is PCP    LABS: Labs reviewed: Acceptable for surgery. (all labs ordered are listed, but only abnormal results are displayed)  Labs Reviewed  BASIC METABOLIC PANEL - Abnormal; Notable for the following components:      Result Value   CO2 21 (*)    All other components within normal limits  CBC - Abnormal; Notable for the following  components:   WBC 10.8 (*)    RBC 3.68 (*)    Hemoglobin 10.8 (*)    HCT 29.9 (*)    MCHC 36.1 (*)    Platelets 104 (*)    All other components within normal limits  SURGICAL PCR SCREEN     IMAGES:   EKG:   CV: Echo 07/05/23  1. Left ventricular ejection fraction, by estimation, is 60 to 65%. The  left ventricle has normal function. The left ventricle has no regional  wall motion abnormalities. Left ventricular diastolic parameters were  normal.   2. Right ventricular systolic function is normal. The right ventricular  size is normal. There is normal pulmonary artery systolic pressure.   3. The mitral valve is normal in structure. No evidence of mitral valve  regurgitation.   4. The aortic valve is normal in structure. Aortic valve regurgitation is  not visualized.   5. The inferior vena cava is normal in size with greater than 50%  respiratory variability, suggesting right atrial pressure of 3 mmHg.   Conclusion(s)/Recommendation(s): Normal biventricular function without  evidence of hemodynamically significant valvular heart disease.  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   Bell's palsy    Depression    Sickle cell trait (HCC)    Sleep apnea  no per pt    Past Surgical History:  Procedure Laterality Date   benign breast tumor removed     2006   BREAST BIOPSY Right 2014   benign   BREAST EXCISIONAL BIOPSY Right 2007   benign    CHOLECYSTECTOMY N/A 12/16/2018   Procedure: LAPAROSCOPIC CHOLECYSTECTOMY;  Surgeon: Berna Bue, MD;  Location: [facility];  Service: General;  Laterality: N/A;   right ankle surgery     2010   TOTAL SHOULDER ARTHROPLASTY Right 06/29/2019   Procedure: TOTAL SHOULDER ARTHROPLASTY;  Surgeon: Teryl Lucy, MD;  Location: [facility];  Service: Orthopedics;  Laterality: Right;   TUBAL LIGATION      MEDICATIONS:  acetaminophen (TYLENOL) 325 MG  tablet   ARIPiprazole (ABILIFY) 20 MG tablet   cyanocobalamin (VITAMIN B12) 1000 MCG tablet   ergocalciferol (VITAMIN D2) 1.25 MG (50000 UT) capsule   ferrous sulfate (FEROSUL) 325 (65 FE) MG tablet   folic acid (FOLVITE) 1 MG tablet   ibuprofen (ADVIL) 200 MG tablet   methocarbamol (ROBAXIN) 500 MG tablet   naproxen sodium (ALEVE) 220 MG tablet   sertraline (ZOLOFT) 100 MG tablet   zolpidem (AMBIEN CR) 12.5 MG CR tablet   No current facility-administered medications for this encounter.    Jodell Cipro Ward, PA-C [facility]-Surgical Testing [phone]

## 2023-09-25 NOTE — H&P (Signed)
SHOULDER ARTHROPLASTY ADMISSION H&P  Patient ID: Tina Mayo MRN: [id number] DOB/AGE: 09-02-1971 52 y.o.  Chief Complaint: left shoulder pain.  Planned Procedure Date: 09/30/23 Medical Clearance by Dr. Concepcion Elk   HPI: Tina Mayo is a 52 y.o. female who presents for evaluation of left shoulder OA. The patient has a history of pain and functional disability in the left shoulder due to arthritis and has failed non-surgical conservative treatments for greater than 12 weeks to include NSAID's and/or analgesics, corticosteriod injections, and activity modification.  Onset of symptoms was gradual, starting 2 years ago with gradually worsening course since that time. The patient noted no past surgery on the left shoulder.  Patient currently rates pain at 9 out of 10 with activity. Patient has worsening of pain with activity and weight bearing and pain that interferes with activities of daily living.  Patient has evidence of joint space narrowing by imaging studies.  There is no active infection.  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   Bell's palsy    Depression    Sickle cell trait (HCC)    Sleep apnea    no per pt   Past Surgical History:  Procedure Laterality Date   benign breast tumor removed     2006   BREAST BIOPSY Right 2014   benign   BREAST EXCISIONAL BIOPSY Right 2007   benign    CHOLECYSTECTOMY N/A 12/16/2018   Procedure: LAPAROSCOPIC CHOLECYSTECTOMY;  Surgeon: Berna Bue, MD;  Location: [facility] OR;  Service: General;  Laterality: N/A;   right ankle surgery     2010   TOTAL SHOULDER ARTHROPLASTY Right 06/29/2019   Procedure: TOTAL SHOULDER ARTHROPLASTY;  Surgeon: Teryl Lucy, MD;  Location: [facility];  Service: Orthopedics;  Laterality: Right;   TUBAL LIGATION     Allergies  Allergen Reactions   Levaquin [Levofloxacin] Anaphylaxis   Ca Phosphate-Cholecalciferol  Hives   Origanum Oil Hives   Penicillins Swelling   Prior to Admission medications   Medication Sig Start Date End Date Taking? Authorizing Provider  acetaminophen (TYLENOL) 325 MG tablet Take 2 tablets (650 mg total) by mouth every 6 (six) hours as needed for mild pain, fever or headache. 07/10/23  Yes Lonia Blood, MD  ARIPiprazole (ABILIFY) 20 MG tablet Take 20 mg by mouth daily.   Yes [provider]  cyanocobalamin (VITAMIN B12) 1000 MCG tablet Take 1 tablet (1,000 mcg total) by mouth daily. 07/10/23  Yes Lonia Blood, MD  ergocalciferol (VITAMIN D2) 1.25 MG (50000 UT) capsule Take 50,000 Units by mouth once a week.   Yes [provider]  ferrous sulfate (FEROSUL) 325 (65 FE) MG tablet Take 1 tablet (325 mg total) by mouth daily with breakfast. 07/11/23  Yes Lonia Blood, MD  folic acid (FOLVITE) 1 MG tablet Take 1 tablet (1 mg total) by mouth daily. 07/11/23  Yes Lonia Blood, MD  ibuprofen (ADVIL) 200 MG tablet Take 200 mg by mouth every 6 (six) hours as needed for moderate pain.   Yes [provider]  methocarbamol (ROBAXIN) 500 MG tablet Take 500 mg by mouth 2 (two) times daily as needed for muscle spasms. 07/29/23  Yes [provider]  naproxen sodium (ALEVE) 220 MG tablet Take 440 mg by mouth daily as needed (pain).   Yes [provider]  sertraline (ZOLOFT) 100 MG tablet Take  100 mg by mouth at bedtime.   Yes [provider]  zolpidem (AMBIEN CR) 12.5 MG CR tablet Take 12.5 mg by mouth at bedtime as needed for sleep. 08/22/23  Yes [provider]   Social History   Socioeconomic History   Marital status: Single    Spouse name: Not on file   Number of children: Not on file   Years of education: Not on file   Highest education level: Not on file  Occupational History   Not on file  Tobacco Use   Smoking status: Every Day    Current packs/day: 0.25    Types: Cigarettes   Smokeless tobacco: Current   Vaping Use   Vaping status: Former  Substance and Sexual Activity   Alcohol use: Yes    Comment: occ   Drug use: No   Sexual activity: Not Currently  Other Topics Concern   Not on file  Social History Narrative   Not on file   Social Determinants of Health   Financial Resource Strain: Low Risk  (06/30/2019)   Overall Financial Resource Strain (CARDIA)    Difficulty of Paying Living Expenses: Not very hard  Food Insecurity: No Food Insecurity (07/05/2023)   Hunger Vital Sign    Worried About Running Out of Food in the Last Year: Never true    Ran Out of Food in the Last Year: Never true  Transportation Needs: No Transportation Needs (07/05/2023)   PRAPARE - Administrator, Civil Service (Medical): No    Lack of Transportation (Non-Medical): No  Physical Activity: Unknown (06/30/2019)   Exercise Vital Sign    Days of Exercise per Week: Patient declined    Minutes of Exercise per Session: Patient declined  Stress: Stress Concern Present (06/30/2019)   [employer] of Occupational Health - Occupational Stress Questionnaire    Feeling of Stress : Rather much  Social Connections: Unknown (06/30/2019)   Social Connection and Isolation Panel [NHANES]    Frequency of Communication with Friends and Family: More than three times a week    Frequency of Social Gatherings with Friends and Family: More than three times a week    Attends Religious Services: Patient declined    Database administrator or Organizations: Patient declined    Attends Engineer, structural: Patient declined    Marital Status: Patient declined   Family History  Problem Relation Age of Onset   Breast cancer Mother    Cancer Mother    Diabetes Mellitus II Father    Diabetes Father    Esophageal cancer Maternal Grandmother    Breast cancer Maternal Grandmother    Colon cancer Neg Hx    Colon polyps Neg Hx    Rectal cancer Neg Hx    Stomach cancer Neg Hx     ROS: Currently denies  lightheadedness, dizziness, Fever, chills, CP, SOB.   No personal history of DVT, PE, MI, or CVA. No loose teeth. + dentures All other systems have been reviewed and were otherwise currently negative with the exception of those mentioned in the HPI and as above.  BMI: There is no height or weight on file to calculate BMI.  Lab Results  Component Value Date   ALBUMIN 3.2 (L) 07/07/2023   Diabetes: Patient does not have a diagnosis of diabetes.     Smoking Status: Current smoker, counseled to cut back or quit   Objective: Vitals: HT: 5\' 7"     WT:  209.2  BMI:  32.8  T: 98.9     BLOOD PRESSURE: 119/79    P: 92 O2 SAT:  95% on room air.   Physical Exam: General: Alert, NAD.   HEENT: + facial droop at baseline Pulm: No increased work of breathing.  Clear B/L A/P w/o crackle or wheeze.  CV: RRR, No m/g/r appreciated  GI: soft, NT, ND Neuro: Neuro without gross focal deficit.  Sensation intact distally Skin: No lesions in the area of chief complaint MSK/Surgical Site: left shoulder pain with range of motion.  Forward flexion/abduction approximately 0-130.  Internal rotation to L1.  External rotation to 70 deg.   Intact Rotator cuff strength.  NVI distally.  Imaging Review CT scan performed on 07-30-23 show a chronic AVN of the left humeral head without articular surface collapse. Moderate to severe osteoarthritis of the left glenohumeral joint.   Assessment: Left shoulder AVN with collapse.    Plan: Plan for Procedure(s): TOTAL SHOULDER ARTHROPLASTY  The patient history, physical exam, clinical judgement of the provider and imaging are consistent with end stage degenerative joint disease and total joint arthroplasty is deemed medically necessary. The treatment options including medical management, injection therapy, and arthroplasty were discussed at length. The risks and benefits of Procedure(s): TOTAL SHOULDER ARTHROPLASTY were presented and reviewed.  The risks of nonoperative  treatment, versus surgical intervention including but not limited to continued pain, aseptic loosening, stiffness, dislocation/subluxation, infection, bleeding, nerve injury, blood clots, cardiopulmonary complications, morbidity, mortality, among others were discussed. The patient verbalizes understanding and wishes to proceed with the plan.  Patient is being admitted for surgery, OT, pain control, prophylactic antibiotics, VTE prophylaxis, progressive ambulation, ADL's and discharge planning.   The patient does meet the criteria for TXA which will be used perioperatively.   The patient is planning to be discharged home care of family.   Armida Sans, PA-C 09/25/2023 1:34 PM

## 2023-09-26 NOTE — Anesthesia Preprocedure Evaluation (Signed)
Anesthesia Evaluation  Patient identified by MRN, date of birth, ID band Patient awake    Reviewed: Allergy & Precautions, NPO status , Patient's Chart, lab work & pertinent test results  History of Anesthesia Complications Negative for: history of anesthetic complications  Airway Mallampati: II  TM Distance: >3 FB Neck ROM: Full    Dental  (+) Edentulous Lower, Edentulous Upper   Pulmonary asthma , sleep apnea , Current Smoker   Pulmonary exam normal        Cardiovascular negative cardio ROS Normal cardiovascular exam     Neuro/Psych   Anxiety Depression    negative neurological ROS     GI/Hepatic negative GI ROS, Neg liver ROS,,,  Endo/Other  negative endocrine ROS    Renal/GU negative Renal ROS  negative genitourinary   Musculoskeletal  (+) Arthritis ,    Abdominal   Peds  Hematology  (+) Blood dyscrasia (Hgb 10.8, Plt 104k), Sickle cell trait and anemia   Anesthesia Other Findings Day of surgery medications reviewed with patient.  Reproductive/Obstetrics                             Anesthesia Physical Anesthesia Plan  ASA: 2  Anesthesia Plan: General   Post-op Pain Management: Tylenol PO (pre-op)* and Regional block*   Induction: Intravenous  PONV Risk Score and Plan: 3 and Treatment may vary due to age or medical condition, Ondansetron, Dexamethasone and Midazolam  Airway Management Planned: Oral ETT  Additional Equipment: None  Intra-op Plan:   Post-operative Plan: Extubation in OR  Informed Consent: I have reviewed the patients History and Physical, chart, labs and discussed the procedure including the risks, benefits and alternatives for the proposed anesthesia with the patient or authorized representative who has indicated his/her understanding and acceptance.     Dental advisory given  Plan Discussed with: CRNA  Anesthesia Plan Comments: (See PAT note  09/19/2023)       Anesthesia Quick Evaluation

## 2023-09-30 ENCOUNTER — Ambulatory Visit (HOSPITAL_COMMUNITY): Payer: Medicaid Other

## 2023-09-30 ENCOUNTER — Encounter (HOSPITAL_COMMUNITY): Payer: Self-pay | Admitting: Orthopedic Surgery

## 2023-09-30 ENCOUNTER — Inpatient Hospital Stay (HOSPITAL_COMMUNITY)
Admission: AD | Admit: 2023-09-30 | Discharge: 2023-10-06 | DRG: 483 | Disposition: A | Discharge status: Home / Self Care | Payer: Medicaid Other | Attending: Orthopedic Surgery | Admitting: Orthopedic Surgery

## 2023-09-30 ENCOUNTER — Ambulatory Visit (HOSPITAL_COMMUNITY): Payer: Medicaid Other | Admitting: Physician Assistant

## 2023-09-30 ENCOUNTER — Ambulatory Visit (HOSPITAL_COMMUNITY): Payer: Medicaid Other | Admitting: Anesthesiology

## 2023-09-30 ENCOUNTER — Other Ambulatory Visit: Payer: Self-pay

## 2023-09-30 ENCOUNTER — Encounter (HOSPITAL_COMMUNITY)
Admission: AD | Disposition: A | Discharge status: Home / Self Care | Payer: Self-pay | Source: Home / Self Care | Attending: Orthopedic Surgery

## 2023-09-30 DIAGNOSIS — M19012 Primary osteoarthritis, left shoulder: Secondary | ICD-10-CM

## 2023-09-30 DIAGNOSIS — F17210 Nicotine dependence, cigarettes, uncomplicated: Secondary | ICD-10-CM | POA: Diagnosis present

## 2023-09-30 DIAGNOSIS — Z96611 Presence of right artificial shoulder joint: Secondary | ICD-10-CM | POA: Diagnosis present

## 2023-09-30 DIAGNOSIS — G4730 Sleep apnea, unspecified: Secondary | ICD-10-CM | POA: Diagnosis present

## 2023-09-30 DIAGNOSIS — Z79899 Other long term (current) drug therapy: Secondary | ICD-10-CM

## 2023-09-30 DIAGNOSIS — D509 Iron deficiency anemia, unspecified: Secondary | ICD-10-CM | POA: Diagnosis present

## 2023-09-30 DIAGNOSIS — M87812 Other osteonecrosis, left shoulder: Principal | ICD-10-CM | POA: Diagnosis present

## 2023-09-30 DIAGNOSIS — F32A Depression, unspecified: Secondary | ICD-10-CM | POA: Diagnosis present

## 2023-09-30 DIAGNOSIS — E876 Hypokalemia: Secondary | ICD-10-CM | POA: Diagnosis not present

## 2023-09-30 DIAGNOSIS — F419 Anxiety disorder, unspecified: Secondary | ICD-10-CM | POA: Diagnosis present

## 2023-09-30 DIAGNOSIS — Z881 Allergy status to other antibiotic agents status: Secondary | ICD-10-CM

## 2023-09-30 DIAGNOSIS — Z800 Family history of malignant neoplasm of digestive organs: Secondary | ICD-10-CM

## 2023-09-30 DIAGNOSIS — D573 Sickle-cell trait: Secondary | ICD-10-CM | POA: Diagnosis present

## 2023-09-30 DIAGNOSIS — Z9109 Other allergy status, other than to drugs and biological substances: Secondary | ICD-10-CM

## 2023-09-30 DIAGNOSIS — J9811 Atelectasis: Secondary | ICD-10-CM | POA: Diagnosis present

## 2023-09-30 DIAGNOSIS — D62 Acute posthemorrhagic anemia: Secondary | ICD-10-CM | POA: Diagnosis not present

## 2023-09-30 DIAGNOSIS — Z9851 Tubal ligation status: Secondary | ICD-10-CM

## 2023-09-30 DIAGNOSIS — K5900 Constipation, unspecified: Secondary | ICD-10-CM | POA: Diagnosis present

## 2023-09-30 DIAGNOSIS — Z803 Family history of malignant neoplasm of breast: Secondary | ICD-10-CM

## 2023-09-30 DIAGNOSIS — Z888 Allergy status to other drugs, medicaments and biological substances status: Secondary | ICD-10-CM

## 2023-09-30 DIAGNOSIS — Z833 Family history of diabetes mellitus: Secondary | ICD-10-CM

## 2023-09-30 DIAGNOSIS — D696 Thrombocytopenia, unspecified: Secondary | ICD-10-CM | POA: Diagnosis present

## 2023-09-30 DIAGNOSIS — Z96612 Presence of left artificial shoulder joint: Principal | ICD-10-CM

## 2023-09-30 DIAGNOSIS — Z880 Allergy status to penicillin: Secondary | ICD-10-CM

## 2023-09-30 DIAGNOSIS — J45909 Unspecified asthma, uncomplicated: Secondary | ICD-10-CM | POA: Diagnosis present

## 2023-09-30 DIAGNOSIS — R0902 Hypoxemia: Secondary | ICD-10-CM | POA: Diagnosis not present

## 2023-09-30 DIAGNOSIS — J159 Unspecified bacterial pneumonia: Secondary | ICD-10-CM | POA: Diagnosis not present

## 2023-09-30 DIAGNOSIS — Y95 Nosocomial condition: Secondary | ICD-10-CM | POA: Diagnosis not present

## 2023-09-30 DIAGNOSIS — R059 Cough, unspecified: Secondary | ICD-10-CM | POA: Diagnosis present

## 2023-09-30 DIAGNOSIS — R091 Pleurisy: Secondary | ICD-10-CM | POA: Diagnosis not present

## 2023-09-30 HISTORY — PX: TOTAL SHOULDER ARTHROPLASTY: SHX126

## 2023-09-30 SURGERY — ARTHROPLASTY, SHOULDER, TOTAL
Anesthesia: General | Site: Shoulder | Laterality: Left

## 2023-09-30 MED ORDER — FENTANYL CITRATE PF 50 MCG/ML IJ SOSY
PREFILLED_SYRINGE | INTRAMUSCULAR | Status: AC
  Administered 2023-09-30: 50 ug via INTRAVENOUS
  Filled 2023-09-30: qty 2

## 2023-09-30 MED ORDER — PHENOL 1.4 % MT LIQD: 1.0000 | OROMUCOSAL | Status: DC | PRN

## 2023-09-30 MED ORDER — CEFAZOLIN SODIUM-DEXTROSE 2-4 GM/100ML-% IV SOLN
2.0000 g | INTRAVENOUS | Status: AC
  Administered 2023-09-30: 2 g via INTRAVENOUS
  Filled 2023-09-30: qty 100

## 2023-09-30 MED ORDER — OXYCODONE HCL 5 MG PO TABS
5.0000 mg | ORAL_TABLET | ORAL | Status: DC | PRN
  Administered 2023-10-01 (×2): 10 mg via ORAL
  Filled 2023-09-30 (×2): qty 2

## 2023-09-30 MED ORDER — LACTATED RINGERS IV SOLN: INTRAVENOUS | Status: DC

## 2023-09-30 MED ORDER — ONDANSETRON HCL 4 MG/2ML IJ SOLN
INTRAMUSCULAR | Status: DC | PRN
  Administered 2023-09-30: 4 mg via INTRAVENOUS

## 2023-09-30 MED ORDER — METHOCARBAMOL 1000 MG/10ML IJ SOLN: 500.0000 mg | Freq: Four times a day (QID) | INTRAMUSCULAR | Status: DC | PRN

## 2023-09-30 MED ORDER — PROPOFOL 10 MG/ML IV BOLUS
INTRAVENOUS | Status: DC | PRN
  Administered 2023-09-30: 120 mg via INTRAVENOUS
  Administered 2023-09-30: 50 mg via INTRAVENOUS
  Administered 2023-09-30: 30 mg via INTRAVENOUS

## 2023-09-30 MED ORDER — METOCLOPRAMIDE HCL 5 MG/ML IJ SOLN: 5.0000 mg | Freq: Three times a day (TID) | INTRAMUSCULAR | Status: DC | PRN

## 2023-09-30 MED ORDER — 0.9 % SODIUM CHLORIDE (POUR BTL) OPTIME
TOPICAL | Status: DC | PRN
  Administered 2023-09-30: 1000 mL

## 2023-09-30 MED ORDER — MAGNESIUM CITRATE PO SOLN: 1.0000 | Freq: Once | ORAL | Status: DC | PRN

## 2023-09-30 MED ORDER — MEPERIDINE HCL 50 MG/ML IJ SOLN: 6.2500 mg | INTRAMUSCULAR | Status: DC | PRN

## 2023-09-30 MED ORDER — GLYCOPYRROLATE 0.2 MG/ML IJ SOLN
INTRAMUSCULAR | Status: DC | PRN
  Administered 2023-09-30 (×2): .1 mg via INTRAVENOUS

## 2023-09-30 MED ORDER — POLYETHYLENE GLYCOL 3350 17 G PO PACK
17.0000 g | PACK | Freq: Every day | ORAL | Status: DC | PRN
  Filled 2023-09-30: qty 1

## 2023-09-30 MED ORDER — FENTANYL CITRATE (PF) 100 MCG/2ML IJ SOLN
INTRAMUSCULAR | Status: DC | PRN
  Administered 2023-09-30 (×2): 50 ug via INTRAVENOUS

## 2023-09-30 MED ORDER — DROPERIDOL 2.5 MG/ML IJ SOLN
0.6250 mg | Freq: Once | INTRAMUSCULAR | Status: DC | PRN
Start: 2023-09-30 — End: 2023-09-30

## 2023-09-30 MED ORDER — CEFAZOLIN SODIUM-DEXTROSE 2-4 GM/100ML-% IV SOLN
2.0000 g | Freq: Four times a day (QID) | INTRAVENOUS | Status: AC
  Administered 2023-09-30 – 2023-10-01 (×3): 2 g via INTRAVENOUS
  Filled 2023-09-30 (×3): qty 100

## 2023-09-30 MED ORDER — ONDANSETRON HCL 4 MG PO TABS: 4.0000 mg | ORAL_TABLET | Freq: Four times a day (QID) | ORAL | Status: DC | PRN

## 2023-09-30 MED ORDER — BUPIVACAINE-EPINEPHRINE (PF) 0.5% -1:200000 IJ SOLN
INTRAMUSCULAR | Status: DC | PRN
  Administered 2023-09-30: 15 mL via PERINEURAL

## 2023-09-30 MED ORDER — ORAL CARE MOUTH RINSE: 15.0000 mL | Freq: Once | OROMUCOSAL | Status: AC

## 2023-09-30 MED ORDER — FENTANYL CITRATE PF 50 MCG/ML IJ SOSY
100.0000 ug | PREFILLED_SYRINGE | Freq: Once | INTRAMUSCULAR | Status: AC
  Administered 2023-09-30: 50 ug via INTRAVENOUS
  Filled 2023-09-30: qty 2

## 2023-09-30 MED ORDER — SODIUM CHLORIDE 0.9% FLUSH
10.0000 mL | Freq: Two times a day (BID) | INTRAVENOUS | Status: DC
  Administered 2023-10-01 – 2023-10-06 (×9): 10 mL via INTRAVENOUS

## 2023-09-30 MED ORDER — IBUPROFEN 200 MG PO TABS: 200.0000 mg | ORAL_TABLET | Freq: Four times a day (QID) | ORAL | Status: DC | PRN

## 2023-09-30 MED ORDER — PHENYLEPHRINE HCL (PRESSORS) 10 MG/ML IV SOLN
INTRAVENOUS | Status: DC | PRN
  Administered 2023-09-30: 80 ug via INTRAVENOUS
  Administered 2023-09-30: 120 ug via INTRAVENOUS
  Administered 2023-09-30: 80 ug via INTRAVENOUS
  Administered 2023-09-30: 40 ug via INTRAVENOUS
  Administered 2023-09-30 (×3): 80 ug via INTRAVENOUS

## 2023-09-30 MED ORDER — DEXAMETHASONE SODIUM PHOSPHATE 10 MG/ML IJ SOLN
INTRAMUSCULAR | Status: AC
Start: 2023-09-30 — End: ?
  Filled 2023-09-30: qty 1

## 2023-09-30 MED ORDER — OXYCODONE HCL 5 MG PO TABS
10.0000 mg | ORAL_TABLET | ORAL | Status: DC | PRN
  Administered 2023-09-30 – 2023-10-01 (×4): 10 mg via ORAL
  Administered 2023-10-02: 15 mg via ORAL
  Administered 2023-10-02: 10 mg via ORAL
  Administered 2023-10-02 – 2023-10-03 (×3): 15 mg via ORAL
  Filled 2023-09-30 (×2): qty 2
  Filled 2023-09-30: qty 3
  Filled 2023-09-30: qty 2
  Filled 2023-09-30 (×3): qty 3
  Filled 2023-09-30 (×2): qty 2

## 2023-09-30 MED ORDER — CHLORHEXIDINE GLUCONATE 0.12 % MT SOLN
15.0000 mL | Freq: Once | OROMUCOSAL | Status: AC
  Administered 2023-09-30: 15 mL via OROMUCOSAL

## 2023-09-30 MED ORDER — SODIUM CHLORIDE 0.9% FLUSH: 3.0000 mL | Freq: Two times a day (BID) | INTRAVENOUS | Status: DC

## 2023-09-30 MED ORDER — SERTRALINE HCL 100 MG PO TABS
100.0000 mg | ORAL_TABLET | Freq: Every day | ORAL | Status: DC
  Administered 2023-09-30 – 2023-10-05 (×6): 100 mg via ORAL
  Filled 2023-09-30 (×6): qty 1

## 2023-09-30 MED ORDER — MIDAZOLAM HCL 2 MG/2ML IJ SOLN
2.0000 mg | Freq: Once | INTRAMUSCULAR | Status: AC
  Administered 2023-09-30: 2 mg via INTRAVENOUS
  Filled 2023-09-30: qty 2

## 2023-09-30 MED ORDER — ROCURONIUM BROMIDE 100 MG/10ML IV SOLN
INTRAVENOUS | Status: DC | PRN
  Administered 2023-09-30: 60 mg via INTRAVENOUS
  Administered 2023-09-30: 20 mg via INTRAVENOUS

## 2023-09-30 MED ORDER — ONDANSETRON HCL 4 MG/2ML IJ SOLN
INTRAMUSCULAR | Status: AC
  Filled 2023-09-30: qty 2

## 2023-09-30 MED ORDER — ARIPIPRAZOLE 10 MG PO TABS
20.0000 mg | ORAL_TABLET | Freq: Every day | ORAL | Status: DC
  Administered 2023-10-01 – 2023-10-06 (×6): 20 mg via ORAL
  Filled 2023-09-30 (×6): qty 2

## 2023-09-30 MED ORDER — FENTANYL CITRATE PF 50 MCG/ML IJ SOSY
25.0000 ug | PREFILLED_SYRINGE | INTRAMUSCULAR | Status: DC | PRN
  Administered 2023-09-30: 50 ug via INTRAVENOUS

## 2023-09-30 MED ORDER — PHENYLEPHRINE HCL-NACL 20-0.9 MG/250ML-% IV SOLN
INTRAVENOUS | Status: DC | PRN
  Administered 2023-09-30: 40 ug/min via INTRAVENOUS

## 2023-09-30 MED ORDER — DIPHENHYDRAMINE HCL 12.5 MG/5ML PO ELIX: 12.5000 mg | ORAL_SOLUTION | ORAL | Status: DC | PRN

## 2023-09-30 MED ORDER — ACETAMINOPHEN 500 MG PO TABS
1000.0000 mg | ORAL_TABLET | Freq: Once | ORAL | Status: AC
  Administered 2023-09-30: 1000 mg via ORAL
  Filled 2023-09-30: qty 2

## 2023-09-30 MED ORDER — BISACODYL 10 MG RE SUPP: 10.0000 mg | Freq: Every day | RECTAL | Status: DC | PRN

## 2023-09-30 MED ORDER — DEXAMETHASONE SODIUM PHOSPHATE 10 MG/ML IJ SOLN
INTRAMUSCULAR | Status: DC | PRN
  Administered 2023-09-30: 8 mg via INTRAVENOUS

## 2023-09-30 MED ORDER — ALUM & MAG HYDROXIDE-SIMETH 200-200-20 MG/5ML PO SUSP
30.0000 mL | ORAL | Status: DC | PRN
Start: 2023-09-30 — End: 2023-10-06
  Administered 2023-10-04: 30 mL via ORAL
  Filled 2023-09-30: qty 30

## 2023-09-30 MED ORDER — ROCURONIUM BROMIDE 10 MG/ML (PF) SYRINGE
PREFILLED_SYRINGE | INTRAVENOUS | Status: AC
  Filled 2023-09-30: qty 10

## 2023-09-30 MED ORDER — DOCUSATE SODIUM 100 MG PO CAPS
100.0000 mg | ORAL_CAPSULE | Freq: Two times a day (BID) | ORAL | Status: DC
  Administered 2023-09-30 – 2023-10-06 (×12): 100 mg via ORAL
  Filled 2023-09-30 (×12): qty 1

## 2023-09-30 MED ORDER — LIDOCAINE HCL (PF) 2 % IJ SOLN
INTRAMUSCULAR | Status: AC
  Filled 2023-09-30: qty 5

## 2023-09-30 MED ORDER — BUPIVACAINE LIPOSOME 1.3 % IJ SUSP
INTRAMUSCULAR | Status: DC | PRN
  Administered 2023-09-30: 10 mL via PERINEURAL

## 2023-09-30 MED ORDER — PROPOFOL 10 MG/ML IV BOLUS
INTRAVENOUS | Status: AC
  Filled 2023-09-30: qty 20

## 2023-09-30 MED ORDER — FENTANYL CITRATE (PF) 100 MCG/2ML IJ SOLN
INTRAMUSCULAR | Status: AC
  Filled 2023-09-30: qty 2

## 2023-09-30 MED ORDER — TRANEXAMIC ACID-NACL 1000-0.7 MG/100ML-% IV SOLN
1000.0000 mg | INTRAVENOUS | Status: AC
  Administered 2023-09-30: 1000 mg via INTRAVENOUS
  Filled 2023-09-30: qty 100

## 2023-09-30 MED ORDER — LIDOCAINE HCL (CARDIAC) PF 100 MG/5ML IV SOSY
PREFILLED_SYRINGE | INTRAVENOUS | Status: DC | PRN
  Administered 2023-09-30: 40 mg via INTRAVENOUS

## 2023-09-30 MED ORDER — ACETAMINOPHEN 325 MG PO TABS: 325.0000 mg | ORAL_TABLET | Freq: Four times a day (QID) | ORAL | Status: DC | PRN

## 2023-09-30 MED ORDER — POVIDONE-IODINE 10 % EX SWAB: 2.0000 | Freq: Once | CUTANEOUS | Status: DC

## 2023-09-30 MED ORDER — METHOCARBAMOL 500 MG PO TABS
500.0000 mg | ORAL_TABLET | Freq: Four times a day (QID) | ORAL | Status: DC | PRN
  Administered 2023-09-30 – 2023-10-06 (×10): 500 mg via ORAL
  Filled 2023-09-30 (×10): qty 1

## 2023-09-30 MED ORDER — HYDROMORPHONE HCL 1 MG/ML IJ SOLN
0.5000 mg | INTRAMUSCULAR | Status: DC | PRN
  Administered 2023-10-02 – 2023-10-06 (×3): 1 mg via INTRAVENOUS
  Filled 2023-09-30 (×3): qty 1

## 2023-09-30 MED ORDER — TRANEXAMIC ACID-NACL 1000-0.7 MG/100ML-% IV SOLN
1000.0000 mg | Freq: Once | INTRAVENOUS | Status: AC
  Administered 2023-09-30: 1000 mg via INTRAVENOUS
  Filled 2023-09-30: qty 100

## 2023-09-30 MED ORDER — STERILE WATER FOR IRRIGATION IR SOLN
Status: DC | PRN
  Administered 2023-09-30: 1000 mL

## 2023-09-30 MED ORDER — ACETAMINOPHEN 500 MG PO TABS
1000.0000 mg | ORAL_TABLET | Freq: Four times a day (QID) | ORAL | Status: AC
  Administered 2023-09-30 – 2023-10-01 (×4): 1000 mg via ORAL
  Filled 2023-09-30 (×4): qty 2

## 2023-09-30 MED ORDER — POTASSIUM CHLORIDE IN NACL 20-0.45 MEQ/L-% IV SOLN
INTRAVENOUS | Status: DC
  Filled 2023-09-30: qty 1000

## 2023-09-30 MED ORDER — SUGAMMADEX SODIUM 200 MG/2ML IV SOLN
INTRAVENOUS | Status: DC | PRN
  Administered 2023-09-30: 400 mg via INTRAVENOUS

## 2023-09-30 MED ORDER — METOCLOPRAMIDE HCL 5 MG PO TABS: 5.0000 mg | ORAL_TABLET | Freq: Three times a day (TID) | ORAL | Status: DC | PRN

## 2023-09-30 MED ORDER — PHENYLEPHRINE 80 MCG/ML (10ML) SYRINGE FOR IV PUSH (FOR BLOOD PRESSURE SUPPORT)
PREFILLED_SYRINGE | INTRAVENOUS | Status: AC
  Filled 2023-09-30: qty 10

## 2023-09-30 MED ORDER — MENTHOL 3 MG MT LOZG: 1.0000 | LOZENGE | OROMUCOSAL | Status: DC | PRN

## 2023-09-30 MED ORDER — ONDANSETRON HCL 4 MG/2ML IJ SOLN: 4.0000 mg | Freq: Four times a day (QID) | INTRAMUSCULAR | Status: DC | PRN

## 2023-09-30 MED ORDER — ZOLPIDEM TARTRATE 5 MG PO TABS: 5.0000 mg | ORAL_TABLET | Freq: Every evening | ORAL | Status: DC | PRN

## 2023-09-30 SURGICAL SUPPLY — 65 items
ADPR HD STD TPR HUM TI RVRS (Orthopedic Implant) ×1 IMPLANT
AID PSTN UNV HD RSTRNT DISP (MISCELLANEOUS) ×1
BAG COUNTER SPONGE SURGICOUNT (BAG) IMPLANT
BAG SPEC THK2 15X12 ZIP CLS (MISCELLANEOUS) ×1
BAG SPNG CNTER NS LX DISP (BAG)
BAG ZIPLOCK 12X15 (MISCELLANEOUS) ×1 IMPLANT
BIT DRILL QUICK REL 1/8 2PK SL (BIT) IMPLANT
BLADE SAW SGTL 73X25 THK (BLADE) ×1 IMPLANT
CEMENT BONE R 1X40 (Cement) IMPLANT
CLSR STERI-STRIP ANTIMIC 1/2X4 (GAUZE/BANDAGES/DRESSINGS) ×1 IMPLANT
COOLER ICEMAN CLASSIC (MISCELLANEOUS) ×1 IMPLANT
COVER BACK TABLE 60X90IN (DRAPES) ×1 IMPLANT
COVER MAYO STAND STRL (DRAPES) ×1 IMPLANT
COVER SURGICAL LIGHT HANDLE (MISCELLANEOUS) ×1 IMPLANT
DRAPE IMP U-DRAPE 54X76 (DRAPES) ×2 IMPLANT
DRAPE POUCH INSTRU U-SHP 10X18 (DRAPES) ×1 IMPLANT
DRAPE SHEET LG 3/4 BI-LAMINATE (DRAPES) ×2 IMPLANT
DRAPE SURG 17X11 SM STRL (DRAPES) ×1 IMPLANT
DRAPE U-SHAPE 47X51 STRL (DRAPES) ×1 IMPLANT
DRSG MEPILEX POST OP 4X8 (GAUZE/BANDAGES/DRESSINGS) ×1 IMPLANT
DURAPREP 26ML APPLICATOR (WOUND CARE) ×2 IMPLANT
ELECT REM PT RETURN 15FT ADLT (MISCELLANEOUS) ×1 IMPLANT
FACESHIELD WRAPAROUND (MASK) ×1 IMPLANT
FACESHIELD WRAPAROUND OR TEAM (MASK) ×1 IMPLANT
GLENOID MOD PE 4 PEG SZ 3 (Shoulder) IMPLANT
GLENOID MOD POST TM SZ2 (Post) IMPLANT
GLOVE BIO SURGEON STRL SZ 6.5 (GLOVE) ×1 IMPLANT
GLOVE BIOGEL PI IND STRL 7.0 (GLOVE) ×1 IMPLANT
GLOVE BIOGEL PI IND STRL 8 (GLOVE) ×1 IMPLANT
GLOVE ORTHO TXT STRL SZ7.5 (GLOVE) ×1 IMPLANT
GOWN STRL SURGICAL XL XLNG (GOWN DISPOSABLE) ×2 IMPLANT
HANDPIECE INTERPULSE COAX TIP (DISPOSABLE) ×1
HEAD HUMERAL BIPOLAR 38X19X39 (Miscellaneous) IMPLANT
HEAD HUMERAL COMP STD (Orthopedic Implant) IMPLANT
HOOD PEEL AWAY T7 (MISCELLANEOUS) ×2 IMPLANT
HUMERAL HEAD BIPOLAR 38X19X39 (Miscellaneous) ×1 IMPLANT
HUMERAL HEAD COMP STD (Orthopedic Implant) ×1 IMPLANT
KIT BASIN OR (CUSTOM PROCEDURE TRAY) ×1 IMPLANT
KIT TURNOVER KIT A (KITS) IMPLANT
NS IRRIG 1000ML POUR BTL (IV SOLUTION) ×1 IMPLANT
PACK SHOULDER (CUSTOM PROCEDURE TRAY) ×1 IMPLANT
PAD ARMBOARD 7.5X6 YLW CONV (MISCELLANEOUS) ×1 IMPLANT
PAD COLD SHLDR WRAP-ON (PAD) ×1 IMPLANT
PIN THREADED REVERSE (PIN) IMPLANT
RESTRAINT HEAD UNIVERSAL NS (MISCELLANEOUS) ×1 IMPLANT
SET HNDPC FAN SPRY TIP SCT (DISPOSABLE) ×1 IMPLANT
SLING ARM FOAM STRAP MED (SOFTGOODS) IMPLANT
SLING ARM IMMOBILIZER LRG (SOFTGOODS) ×1 IMPLANT
SMARTMIX MINI TOWER (MISCELLANEOUS) ×1
SPIKE FLUID TRANSFER (MISCELLANEOUS) IMPLANT
STEM HUMERAL STRL 4X55 (Stem) IMPLANT
SUCTION TUBE FRAZIER 12FR DISP (SUCTIONS) ×1 IMPLANT
SUPPORT WRAP ARM LG (MISCELLANEOUS) ×1 IMPLANT
SUT MAXBRAID #2 CVD NDL (SUTURE) ×1 IMPLANT
SUT MAXBRAID #5 CCS-NDL 2PK (SUTURE) ×2 IMPLANT
SUT VIC AB 0 CT1 27 (SUTURE) ×1
SUT VIC AB 0 CT1 27XBRD ANBCTR (SUTURE) ×1 IMPLANT
SUT VIC AB 2-0 CT1 27 (SUTURE) ×1
SUT VIC AB 2-0 CT1 TAPERPNT 27 (SUTURE) ×1 IMPLANT
SUT VIC AB 3-0 SH 27 (SUTURE)
SUT VIC AB 3-0 SH 27X BRD (SUTURE) IMPLANT
TOWEL OR 17X26 10 PK STRL BLUE (TOWEL DISPOSABLE) ×1 IMPLANT
TOWEL OR NON WOVEN STRL DISP B (DISPOSABLE) ×1 IMPLANT
TOWER SMARTMIX MINI (MISCELLANEOUS) IMPLANT
WATER STERILE IRR 1000ML POUR (IV SOLUTION) ×2 IMPLANT

## 2023-09-30 NOTE — Op Note (Signed)
09/30/2023  3:50 PM  PATIENT:  Tina Mayo    PRE-OPERATIVE DIAGNOSIS: Left shoulder avascular necrosis  POST-OPERATIVE DIAGNOSIS: Left shoulder avascular necrosis with chondral irregularities on the glenoid  PROCEDURE: Left total Shoulder Arthroplasty  SURGEON:  Eulas Post, MD  PHYSICIAN ASSISTANT: Janine Ores, PA-C, present and scrubbed throughout the case, critical for completion in a timely fashion, and for retraction, instrumentation, and closure.  ANESTHESIA:   General with an interscalene block  ESTIMATED BLOOD LOSS: 150 mL  UNIQUE ASPECTS OF THE CASE: Similar to the other side, the humeral side had extreme sclerosis.  I reamed up to a size 8, broached to a size 7 although did not get quite get the 7 all the way down, and then placed the 4, and it still did not fully seat.  Initially, it seemed like it was still a little bit loose, and then finally with slightly more aggressive impaction it seemed like it got the porous-coated press-fit.  I was a little concerned that I had over prepared it, but ultimately the head still sat slightly up, and it felt stable.  There was severe sclerosis throughout the cancellous bone.  The glenoid had chondral changes in the central region as well as posteriorly and superiorly.  It was definitely not pristine.  I debated doing a hemiarthroplasty, similar to the other side, but given the chondral changes I felt that the long-term support of the glenoid bone would be optimized with a glenoid component.  I had all 4 holes contained within the vault.  Initially I was a little concerned that my guidepin was aimed inferiorly too much, however the inferior lip of the glenoid was scooped out, and somewhat deceiving, and on the reamings I removed the inferior bone and chondral face before I reached the superior bone and chondral face, so I think her anatomy was just a little bit deceiving.  Ultimately I had excellent seating and fixation of the  component.  PREOPERATIVE INDICATIONS:  Tina Mayo is a  52 y.o. female who failed conservative measures and elected for surgical management.    The risks benefits and alternatives were discussed with the patient preoperatively including but not limited to the risks of infection, bleeding, nerve injury, cardiopulmonary complications, the need for revision surgery, dislocation, loosening, incomplete relief of pain, among others, and the patient was willing to proceed.   OPERATIVE IMPLANTS:  Implant Name Type Inv. Item Serial No. Manufacturer Lot No. LRB No. Used Action  CEMENT BONE R 1X40 - [id number] Cement CEMENT BONE R 1X40  ZIMMER RECON(ORTH,TRAU,BIO,SG) [id number] Left 1 Implanted  GLENOID MOD POST TM SZ2 - [id number] Post GLENOID MOD POST TM SZ2  ZIMMER [employer]ON(ORTH,TRAU,BIO,SG) [id number] Left 1 Implanted  GLENOID MOD PE 4 PEG SZ 3 - [id number] Shoulder GLENOID MOD PE 4 PEG SZ 3  ZIMMER RECON(ORTH,TRAU,BIO,SG) [id number] Left 1 Implanted  ADPR HD STD TPR HUM TI RVRS - [id number] Orthopedic Implant ADPR HD STD TPR HUM TI RVRS  ZIMMER [employer]ON(ORTH,TRAU,BIO,SG) [id number] Left 1 Implanted  SCREW CANNULATED 8.0X95 - [id number] Screw SCREW CANNULATED 8.0X95  ZIMMER RECON(ORTH,TRAU,BIO,SG) [id number] Left 1 Implanted  HUMERAL HEAD BIPOLAR 20U54Y70 - [id number] Miscellaneous HUMERAL HEAD BIPOLAR [id number]  ZIMMER RECON(ORTH,TRAU,BIO,SG) [id number] Left 1 Implanted     OPERATIVE FINDINGS: Avascular necrosis with dark blue-colored bone, sclerosis throughout the metaphysis, with osteophyte formation along the inferior rim of the humeral head.   OPERATIVE PROCEDURE: The patient was brought to the operating room and placed in the supine position. General anesthesia  was administered. IV antibiotics were given.  The upper extremity was prepped and draped in usual sterile fashion. The patient was in a beachchair position with all bony prominences padded.   Time out was performed and a deltopectoral approach  was carried out. The biceps tendon was tenodesed to the pectoralis tendon. The subscapularis was released, tagging it with a #2 FiberWire, leaving a cuff of tendon for repair.   The inferior osteophyte was removed, and release of the capsule off of the humeral side was completed. The head was dislocated, and I reamed sequentially. I placed the humeral cutting guide at 30 of retroversion, and then pinned this into place, and made my humeral neck cut. This was at the appropriate level.   I then placed deep retractors and exposed the glenoid. I excised the labrum circumferentially, taking care to protect the axillary nerve inferiorly.   I then placed a guidewire into the center position, controlling appropriate version and inclination. I then reamed over the guidewire with the small reamer, and was satisfied with the preparation. I preserved the subchondral bone in order to maximize the strength and minimize the risk for subsequent subsidence.   I then drilled the central hole for the regenerex peg, and then placed the guide, and then drilled the 3 peripheral peg holes. I had excellent bony circumferential contact.   I then cleaned the glenoid, irrigated it copiously, and then dried it and cemented the prosthesis into place. Excellent seating was achieved. I had full exposure. The cement cured while I turned my attention to the humeral side.   I sequentially broached, up to the selected size, with the broach set at 30 of retroversion. I placed 3 #2 Fiberwire through the bone for subsequent repair.  I then placed the real stem. I trialed with multiple heads, and the above-named component was selected. Increased posterior coverage improved the coverage. The soft tissue tension was appropriate.   I then impacted the real humeral head into place, reduced the head, and irrigated copiously. Excellent stability and range of motion was achieved. I repaired the subscapularis with a total of 5 max braid; one for  the interval, one for the corner, and then the remaining three from the lesser tuberosity which had already been passed.  Excellent repair achieved and I irrigated copiously once more. The subcutaneous tissue was closed with Vicryl including the deltopectoral fascia.   The skin was closed with Steri-Strips and sterile gauze was applied. She had a preoperative nerve block. She tolerated the procedure well and there were no complications.

## 2023-09-30 NOTE — Anesthesia Procedure Notes (Signed)
Procedure Name: Intubation Date/Time: 09/30/2023 1:48 PM  Performed by: Ahmed Prima, CRNAPre-anesthesia Checklist: Patient identified, Emergency Drugs available, Suction available and Patient being monitored Patient Re-evaluated:Patient Re-evaluated prior to induction Oxygen Delivery Method: Circle System Utilized Preoxygenation: Pre-oxygenation with 100% oxygen Induction Type: IV induction Ventilation: Mask ventilation without difficulty Laryngoscope Size: Miller and 2 Grade View: Grade II Tube type: Oral Number of attempts: 1 Airway Equipment and Method: Stylet and Oral airway Placement Confirmation: ETT inserted through vocal cords under direct vision, positive ETCO2 and breath sounds checked- equal and bilateral Secured at: 22 (at the lip) cm Tube secured with: Tape Dental Injury: Teeth and Oropharynx as per pre-operative assessment

## 2023-09-30 NOTE — Anesthesia Postprocedure Evaluation (Signed)
Anesthesia Post Note  Patient: Tina Mayo  Procedure(s) Performed: TOTAL SHOULDER ARTHROPLASTY (Left: Shoulder)     Patient location during evaluation: PACU Anesthesia Type: General Level of consciousness: sedated and patient cooperative Pain management: pain level controlled Vital Signs Assessment: post-procedure vital signs reviewed and stable Respiratory status: spontaneous breathing Cardiovascular status: stable Anesthetic complications: no   No notable events documented.  Last Vitals:  Vitals:   09/30/23 1715 09/30/23 1730  BP: (!) 114/95 100/65  Pulse: 91 66  Resp: 19 (!) 21  Temp:    SpO2: 94% 94%    Last Pain:  Vitals:   09/30/23 1730  TempSrc:   PainSc: Asleep                 Lewie Loron

## 2023-09-30 NOTE — Anesthesia Procedure Notes (Signed)
Anesthesia Regional Block: Interscalene brachial plexus block   Pre-Anesthetic Checklist: , timeout performed,  Correct Patient, Correct Site, Correct Laterality,  Correct Procedure, Correct Position, site marked,  Risks and benefits discussed,  Pre-op evaluation,  At surgeon's request and post-op pain management  Laterality: Left  Prep: Maximum Sterile Barrier Precautions used, chloraprep       Needles:  Injection technique: Single-shot  Needle Type: Echogenic Stimulator Needle     Needle Length: 4cm  Needle Gauge: 22     Additional Needles:   Procedures:,,,, ultrasound used (permanent image in chart),,    Narrative:  Start time: 09/30/2023 12:01 PM End time: 09/30/2023 12:04 PM Injection made incrementally with aspirations every 5 mL.  Performed by: Personally  Anesthesiologist: Kaylyn Layer, MD  Additional Notes: Risks, benefits, and alternative discussed. Patient gave consent for procedure. Patient prepped and draped in sterile fashion. Sedation administered, patient remains easily responsive to voice. Relevant anatomy identified with ultrasound guidance. Local anesthetic given in 5cc increments with no signs or symptoms of intravascular injection. No pain or paraesthesias with injection. Patient monitored throughout procedure with signs of LAST or immediate complications. Tolerated well. Ultrasound image placed in chart.  Tina Greenhouse, MD

## 2023-09-30 NOTE — Interval H&P Note (Signed)
History and Physical Interval Note:  09/30/2023 1:17 PM  Tina Mayo  has presented today for surgery, with the diagnosis of left shoulder OA.  The various methods of treatment have been discussed with the patient and family. After consideration of risks, benefits and other options for treatment, the patient has consented to  Procedure(s): TOTAL SHOULDER ARTHROPLASTY (Left) as a surgical intervention.  The patient's history has been reviewed, patient examined, no change in status, stable for surgery.  I have reviewed the patient's chart and labs.  Questions were answered to the patient's satisfaction.     Eulas Post

## 2023-09-30 NOTE — Discharge Instructions (Signed)
POST-OPERATIVE INSTRUCTIONS - TOTAL SHOULDER REPLACEMENT    WOUND CARE You may leave the operative dressing in place until your follow-up appointment. KEEP THE INCISIONS CLEAN AND DRY. There may be a small amount of fluid/bleeding leaking at the surgical site. This is normal after surgery.  If it fills with liquid or blood please call us immediately to change it for you. Use the provided ice machine or Ice packs as often as possible for the first 3-4 days, then as needed for pain relief.   Keep a layer of cloth or a shirt between your skin and the cooling unit to prevent frost bite as it can get very cold.  SHOWERING: - You may shower on Post-Op Day #3.  - Keep incision covered with plastic bag or plastic wrap when showering. - You may remove the sling for showering - Gently pat the area dry.  - Do not soak the shoulder in water.  - Do not go swimming in the pool or ocean until your incision has completely healed - KEEP THE INCISIONS CLEAN AND DRY.  EXERCISES Wear the sling at all times  You may remove the sling for showering, but keep the arm across the chest or in a secondary sling.    Accidental/Purposeful External Rotation and shoulder flexion (reaching behind you) is to be avoided at all costs for the first month. It is ok to come out of your sling if your are sitting and have assistance for eating.   Do not lift anything heavier than 1 pound until we discuss it further in clinic. It is normal for your fingers/hand to become more swollen after surgery and discolored from bruising.   This will resolve over the first few weeks usually after surgery. Please continue to ambulate and do not stay sitting or lying for too long.  Perform foot and wrist pumps to assist in circulation.  REGIONAL ANESTHESIA (NERVE BLOCKS) The anesthesia team may have performed a nerve block for you this is a great tool used to minimize pain.   The block may start wearing off overnight (between 8-24  hours postop) When the block wears off, your pain may go from nearly zero to the pain you would have had postop without the block. This is an abrupt transition but nothing dangerous is happening.   This can be a challenging period but utilize your as needed pain medications to try and manage this period. We suggest you use the pain medication the first night prior to going to bed, to ease this transition.  You may take an extra dose of narcotic when this happens if needed FOLLOW-UP If you develop a Fever (>101.5), Redness or Drainage from the surgical incision site, please call our office to arrange for an evaluation. Please call the office to schedule a follow-up appointment for a wound check, 7-10 days post-operatively.  IF YOU HAVE ANY QUESTIONS, PLEASE FEEL FREE TO CALL OUR OFFICE.  HELPFUL INFORMATION  Your arm will be in a sling following surgery. You will be in this sling for the next 4 weeks.   You may be more comfortable sleeping in a semi-seated position the first few nights following surgery.  Keep a pillow propped under the elbow and forearm for comfort.  If you have a recliner type of chair it might be beneficial.  If not that is fine too, but it would be helpful to sleep propped up with pillows behind your operated shoulder as well under your elbow and forearm.  This will reduce pulling on the suture lines.  When dressing, put your operative arm in the sleeve first.  When getting undressed, take your operative arm out last.  Loose fitting, button-down shirts are recommended.  In most states it is against the law to drive while your arm is in a sling. And certainly against the law to drive while taking narcotics.  You may return to work/school in the next couple of days when you feel up to it. Desk work and typing in the sling is fine.  We suggest you use the pain medication the first night prior to going to bed, in order to ease any pain when the anesthesia wears off. You should  avoid taking pain medications on an empty stomach as it will make you nauseous.  You should wean off your narcotic medicines as soon as you are able.     Most patients will be off narcotics before their first postop appointment.   Do not drink alcoholic beverages or take illicit drugs when taking pain medications.  Pain medication may make you constipated.  Below are a few solutions to try in this order: Decrease the amount of pain medication if you aren't having pain. Drink lots of decaffeinated fluids. Drink prune juice and/or each dried prunes  If the first 3 don't work start with additional solutions Take Colace - an over-the-counter stool softener Take Senokot - an over-the-counter laxative Take Miralax - a stronger over-the-counter laxative   Dental Antibiotics:  In most cases prophylactic antibiotics for Dental procdeures after total joint surgery are not necessary.  Exceptions are as follows:  1. History of prior total joint infection  2. Severely immunocompromised (Organ Transplant, cancer chemotherapy, Rheumatoid biologic Meds)  3. Poorly controlled diabetes (A1C &gt; 8.0, blood glucose over 200)  If you have one of these conditions, contact your surgeon for an antibiotic prescription, prior to your dental procedure.

## 2023-09-30 NOTE — Plan of Care (Signed)
  Problem: Coping: Goal: Level of anxiety will decrease Outcome: Progressing   Problem: Elimination: Goal: Will not experience complications related to bowel motility Outcome: Progressing Goal: Will not experience complications related to urinary retention Outcome: Progressing   Problem: Pain Management: Goal: General experience of comfort will improve Outcome: Progressing

## 2023-09-30 NOTE — Transfer of Care (Signed)
Immediate Anesthesia Transfer of Care Note  Patient: Tina Mayo  Procedure(s) Performed: TOTAL SHOULDER ARTHROPLASTY (Left: Shoulder)  Patient Location: PACU  Anesthesia Type:GA combined with regional for post-op pain  Level of Consciousness: drowsy and patient cooperative  Airway & Oxygen Therapy: Patient Spontanous Breathing and Patient connected to face mask oxygen  Post-op Assessment: Report given to RN and Post -op Vital signs reviewed and stable  Post vital signs: Reviewed and stable  Last Vitals:  Vitals Value Taken Time  BP 99/69 09/30/23 1616  Temp    Pulse 85 09/30/23 1620  Resp 22 09/30/23 1620  SpO2 93 % 09/30/23 1620  Vitals shown include unfiled device data.  Last Pain:  Vitals:   09/30/23 1245  TempSrc:   PainSc: 0-No pain         Complications: No notable events documented.

## 2023-09-30 NOTE — Plan of Care (Signed)
  Problem: Education: Goal: Knowledge of General Education information will improve Description: Including pain rating scale, medication(s)/side effects and non-pharmacologic comfort measures Outcome: Progressing   Problem: Activity: Goal: Risk for activity intolerance will decrease Outcome: Progressing   Problem: Pain Management: Goal: General experience of comfort will improve Outcome: Progressing

## 2023-09-30 NOTE — Plan of Care (Signed)
[facility] Tonsillectomy/Adenoidectomy, Postoperative PEDS care plan entered in error.

## 2023-10-01 ENCOUNTER — Observation Stay (HOSPITAL_COMMUNITY): Payer: Medicaid Other

## 2023-10-01 DIAGNOSIS — M19012 Primary osteoarthritis, left shoulder: Secondary | ICD-10-CM | POA: Diagnosis present

## 2023-10-01 DIAGNOSIS — M87812 Other osteonecrosis, left shoulder: Secondary | ICD-10-CM | POA: Diagnosis present

## 2023-10-01 DIAGNOSIS — F32A Depression, unspecified: Secondary | ICD-10-CM | POA: Diagnosis present

## 2023-10-01 DIAGNOSIS — Z833 Family history of diabetes mellitus: Secondary | ICD-10-CM | POA: Diagnosis not present

## 2023-10-01 DIAGNOSIS — D62 Acute posthemorrhagic anemia: Secondary | ICD-10-CM | POA: Diagnosis not present

## 2023-10-01 DIAGNOSIS — Z96612 Presence of left artificial shoulder joint: Secondary | ICD-10-CM | POA: Diagnosis not present

## 2023-10-01 DIAGNOSIS — Z96611 Presence of right artificial shoulder joint: Secondary | ICD-10-CM | POA: Diagnosis present

## 2023-10-01 DIAGNOSIS — J159 Unspecified bacterial pneumonia: Secondary | ICD-10-CM | POA: Diagnosis not present

## 2023-10-01 DIAGNOSIS — R059 Cough, unspecified: Secondary | ICD-10-CM | POA: Diagnosis present

## 2023-10-01 DIAGNOSIS — Z803 Family history of malignant neoplasm of breast: Secondary | ICD-10-CM | POA: Diagnosis not present

## 2023-10-01 DIAGNOSIS — D696 Thrombocytopenia, unspecified: Secondary | ICD-10-CM | POA: Diagnosis present

## 2023-10-01 DIAGNOSIS — Z880 Allergy status to penicillin: Secondary | ICD-10-CM | POA: Diagnosis not present

## 2023-10-01 DIAGNOSIS — D509 Iron deficiency anemia, unspecified: Secondary | ICD-10-CM | POA: Diagnosis present

## 2023-10-01 DIAGNOSIS — E876 Hypokalemia: Secondary | ICD-10-CM | POA: Diagnosis not present

## 2023-10-01 DIAGNOSIS — R091 Pleurisy: Secondary | ICD-10-CM | POA: Diagnosis not present

## 2023-10-01 DIAGNOSIS — D573 Sickle-cell trait: Secondary | ICD-10-CM | POA: Diagnosis present

## 2023-10-01 DIAGNOSIS — F17210 Nicotine dependence, cigarettes, uncomplicated: Secondary | ICD-10-CM | POA: Diagnosis present

## 2023-10-01 DIAGNOSIS — K5900 Constipation, unspecified: Secondary | ICD-10-CM | POA: Diagnosis present

## 2023-10-01 DIAGNOSIS — F419 Anxiety disorder, unspecified: Secondary | ICD-10-CM | POA: Diagnosis present

## 2023-10-01 DIAGNOSIS — J45909 Unspecified asthma, uncomplicated: Secondary | ICD-10-CM | POA: Diagnosis present

## 2023-10-01 DIAGNOSIS — Z9109 Other allergy status, other than to drugs and biological substances: Secondary | ICD-10-CM | POA: Diagnosis not present

## 2023-10-01 DIAGNOSIS — Y95 Nosocomial condition: Secondary | ICD-10-CM | POA: Diagnosis not present

## 2023-10-01 DIAGNOSIS — Z881 Allergy status to other antibiotic agents status: Secondary | ICD-10-CM | POA: Diagnosis not present

## 2023-10-01 DIAGNOSIS — J9811 Atelectasis: Secondary | ICD-10-CM | POA: Diagnosis present

## 2023-10-01 DIAGNOSIS — Z888 Allergy status to other drugs, medicaments and biological substances status: Secondary | ICD-10-CM | POA: Diagnosis not present

## 2023-10-01 DIAGNOSIS — Z800 Family history of malignant neoplasm of digestive organs: Secondary | ICD-10-CM | POA: Diagnosis not present

## 2023-10-01 DIAGNOSIS — R0902 Hypoxemia: Secondary | ICD-10-CM | POA: Diagnosis not present

## 2023-10-01 LAB — CBC
HCT: 23.1 % — ABNORMAL LOW (ref 36.0–46.0)
Hemoglobin: 8.1 g/dL — ABNORMAL LOW (ref 12.0–15.0)
MCH: 29.2 pg (ref 26.0–34.0)
MCHC: 35.1 g/dL (ref 30.0–36.0)
MCV: 83.4 fL (ref 80.0–100.0)
Platelets: 60 10*3/uL — ABNORMAL LOW (ref 150–400)
RBC: 2.77 MIL/uL — ABNORMAL LOW (ref 3.87–5.11)
RDW: 14.6 % (ref 11.5–15.5)
WBC: 24.5 10*3/uL — ABNORMAL HIGH (ref 4.0–10.5)
nRBC: 0.4 % — ABNORMAL HIGH (ref 0.0–0.2)

## 2023-10-01 LAB — BASIC METABOLIC PANEL
Anion gap: 6 (ref 5–15)
BUN: 8 mg/dL (ref 6–20)
CO2: 23 mmol/L (ref 22–32)
Calcium: 8.4 mg/dL — ABNORMAL LOW (ref 8.9–10.3)
Chloride: 109 mmol/L (ref 98–111)
Creatinine, Ser: 0.84 mg/dL (ref 0.44–1.00)
GFR, Estimated: >60 mL/min (ref >60)
Glucose, Bld: 113 mg/dL — ABNORMAL HIGH (ref 70–99)
Potassium: 4.3 mmol/L (ref 3.5–5.1)
Sodium: 138 mmol/L (ref 135–145)

## 2023-10-01 MED ORDER — ALBUTEROL SULFATE (2.5 MG/3ML) 0.083% IN NEBU
2.5000 mg | INHALATION_SOLUTION | RESPIRATORY_TRACT | Status: DC | PRN
  Administered 2023-10-01 – 2023-10-02 (×2): 2.5 mg via RESPIRATORY_TRACT
  Filled 2023-10-01 (×2): qty 3

## 2023-10-01 MED ORDER — ALBUTEROL SULFATE (2.5 MG/3ML) 0.083% IN NEBU: 2.5000 mg | INHALATION_SOLUTION | Freq: Four times a day (QID) | RESPIRATORY_TRACT | Status: DC | PRN

## 2023-10-01 MED ORDER — ALBUTEROL SULFATE HFA 108 (90 BASE) MCG/ACT IN AERS: 1.0000 | INHALATION_SPRAY | Freq: Four times a day (QID) | RESPIRATORY_TRACT | Status: DC | PRN

## 2023-10-01 MED ORDER — ALBUTEROL SULFATE HFA 108 (90 BASE) MCG/ACT IN AERS
1.0000 | INHALATION_SPRAY | Freq: Once | RESPIRATORY_TRACT | Status: AC
  Administered 2023-10-01: 1 via RESPIRATORY_TRACT
  Filled 2023-10-01: qty 6.7

## 2023-10-01 MED ORDER — IBUPROFEN 200 MG PO TABS
200.0000 mg | ORAL_TABLET | Freq: Four times a day (QID) | ORAL | Status: DC | PRN
  Administered 2023-10-01 – 2023-10-05 (×5): 200 mg via ORAL
  Filled 2023-10-01 (×5): qty 1

## 2023-10-01 NOTE — Progress Notes (Signed)
MEWS Progress Note  Patient Details Name: Tina Mayo MRN: [id number] DOB: 1971-08-24 Today's Date: 10/01/2023   MEWS Flowsheet Documentation:  Assess: MEWS Score Temp: 99.8 F (37.7 C) BP: 105/67 MAP (mmHg): 78 Pulse Rate: (!) 123 ECG Heart Rate: 97 Resp: (!) 22 Level of Consciousness: Alert SpO2: 91 % O2 Device: Nasal Cannula O2 Flow Rate (L/min): 3 L/min Assess: MEWS Score MEWS Temp: 0 MEWS Systolic: 0 MEWS Pulse: 2 MEWS RR: 1 MEWS LOC: 0 MEWS Score: 3 MEWS Score Color: Yellow Assess: SIRS CRITERIA SIRS Temperature : 0 SIRS Respirations : 1 SIRS Pulse: 1 SIRS WBC: 0 SIRS Score Sum : 2 Assess: if the MEWS score is Yellow or Red Were vital signs accurate and taken at a resting state?: Yes Does the patient meet 2 or more of the SIRS criteria?: Yes Does the patient have a confirmed or suspected source of infection?: No MEWS guidelines implemented : Yes, yellow Treat MEWS Interventions: Considered administering scheduled or prn medications/treatments as ordered Take Vital Signs Increase Vital Sign Frequency : Yellow: Q2hr x1, continue Q4hrs until patient remains green for 12hrs Escalate MEWS: Escalate: Yellow: Discuss with charge nurse and consider notifying provider and/or RRT Notify: Charge Nurse/RN Name of Charge Nurse/RN Notified: Salome Holmes, RN Provider Notification Provider Name/Title: Sander Radon, PA Date Provider Notified: 10/01/23 Time Provider Notified: 2309 Method of Notification: Call (HIPPA appropriate message left on voicemail) Notification Reason: Other (Comment) (mews protocol) Provider response: Other (Comment) (awaiting call back) Notify: Rapid Response Name of Rapid Response RN Notified: not necessary at this time      [name] 10/01/2023, 11:16 PM

## 2023-10-01 NOTE — Evaluation (Signed)
Occupational Therapy Evaluation Patient Details Name: Tina Mayo MRN: [id number] DOB: Apr 15, 1971 Today's Date: 10/01/2023   History of Present Illness Patient is a 52 year old female who presented with L shoulder avascular necorsis. patient underwent L total shoulder replacement. PMH: anemia, CAD, depression, thrombocytopenia, pneumonia.   Clinical Impression   Patient is a 52 year old female who was admitted for above. Patient  lives at home alone with 14 steps to enter house. Patient today was noted to have HR increase to 133 bpm with patient reporting feeling heart beating fast in chest. Patient returned to bed and nurse made aware. O2 on RA was 97% or above during session. Patient plans to d/c home with family support PRN. Patient reported she dose not like people in her house. OT to continue to follow and check back.       If plan is discharge home, recommend the following: A little help with walking and/or transfers;A lot of help with bathing/dressing/bathroom;Assistance with cooking/housework;Direct supervision/assist for medications management;Assist for transportation;Help with stairs or ramp for entrance;Direct supervision/assist for financial management    Functional Status Assessment  Patient has had a recent decline in their functional status and demonstrates the ability to make significant improvements in function in a reasonable and predictable amount of time.  Equipment Recommendations  None recommended by OT       Precautions / Restrictions Precautions Precautions: Shoulder Type of Shoulder Precautions: no shoulder ROM, ok for hand wrist and elbow ROM, Shoulder Interventions: Shoulder sling/immobilizer;Off for dressing/bathing/exercises;At all times Precaution Booklet Issued: Yes (comment) Restrictions Weight Bearing Restrictions: Yes LUE Weight Bearing: Non weight bearing      Mobility Bed Mobility Overal bed mobility: Needs Assistance              General bed mobility comments: HOB raised with increased time.        Balance Overall balance assessment: Mild deficits observed, not formally tested               ADL either performed or assessed with clinical judgement   ADL Overall ADL's : Needs assistance/impaired Eating/Feeding: Modified independent;Sitting   Grooming: Sitting;Set up                   Toilet Transfer: Supervision/safety;Ambulation Toilet Transfer Details (indicate cue type and reason): HR up to 133 bpm with minimal movement in room. 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            Pertinent Vitals/Pain Pain Assessment Pain Assessment: Faces Faces Pain Scale: Hurts little more Pain Location: L shoulder Pain Descriptors / Indicators: Discomfort, Grimacing Pain Intervention(s): Limited activity within patient's tolerance, Monitored during session, Repositioned, Premedicated before session     Extremity/Trunk Assessment Upper Extremity Assessment Upper Extremity Assessment: LUE deficits/detail LUE Deficits / Details: shoulder surgery on 10/22. patient able to wiggle digits but not able to feel ring or pinky finger.   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      Cognition Arousal: Alert Behavior During Therapy: WFL for tasks assessed/performed Overall Cognitive Status: Within Functional Limits for tasks assessed  Home Living Family/patient expects to be discharged to:: Private residence Living Arrangements: Alone Available Help at Discharge: Family;Available  PRN/intermittently Type of Home: Apartment Home Access: Stairs to enter Entrance Stairs-Number of Steps: 14 Entrance Stairs-Rails: Can reach both Home Layout: One level     Bathroom Shower/Tub: Chief Strategy Officer: Standard     Home Equipment: None          Prior Functioning/Environment Prior Level of Function : Independent/Modified Independent;Driving;Working/employed             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 Problem List: Decreased activity tolerance;Impaired balance (sitting and/or standing);Decreased coordination;Decreased safety awareness;Impaired UE functional use      OT Treatment/Interventions: Self-care/ADL training;Therapeutic exercise;DME and/or AE instruction;Therapeutic activities;Patient/family education;Balance training    OT Goals(Current goals can be found in the care plan section) Acute Rehab OT Goals Patient Stated Goal: to go home OT Goal Formulation: With patient Time For Goal Achievement: 10/15/23 Potential to Achieve Goals: Fair  OT Frequency: Min 1X/week       AM-PAC OT "6 Clicks" Daily Activity     Outcome Measure Help from another person eating meals?: A Little Help from another person taking care of personal grooming?: A Little Help from another person toileting, which includes using toliet, bedpan, or urinal?: A Little Help from another person bathing (including washing, rinsing, drying)?: A Little Help from another person to put on and taking off regular upper body clothing?: A Little Help from another person to put on and taking off regular lower body clothing?: A Little 6 Click Score: 18   End of Session Equipment Utilized During Treatment: Other (comment) (sling) Nurse Communication: Other (comment) (HR during session)  Activity Tolerance: Patient tolerated treatment well Patient left: in bed;with call bell/phone within reach  OT Visit Diagnosis: Unsteadiness on feet  (R26.81);Pain Pain - Right/Left: Left Pain - part of body: Shoulder                Time: 4098-1191 OT Time Calculation (min): 46 min Charges:  OT General Charges $OT Visit: 1 Visit OT Evaluation $OT Eval Low Complexity: 1 Low OT Treatments $Self Care/Home Management : 23-37 mins  Minda Faas OTR/L, MS Acute Rehabilitation Department Office# [phone]   Selinda Flavin 10/01/2023, 10:50 AM

## 2023-10-01 NOTE — Progress Notes (Signed)
Subjective: 1 Day Post-Op s/p Procedure(s): TOTAL SHOULDER ARTHROPLASTY   Patient is alert, oriented. Says she has had a little shortness of breath this morning. Denies CP, Calf pain. No nausea/vomiting. No other complaints. Has a history of HAP vs. Aspiration pneumonia after last shoulder surgery. No bowel movement yet. Pain has so far been severe as block is starting to wear off.    Objective:  PE: VITALS:   Vitals:   09/30/23 1800 09/30/23 2250 10/01/23 0138 10/01/23 0540  BP: 124/74 99/64 113/62 101/69  Pulse: 80 78 83 86  Resp: 16 17 17 17   Temp: 98.5 F (36.9 C) 98.1 F (36.7 C) 98.4 F (36.9 C) 98.4 F (36.9 C)  TempSrc: Oral Oral Oral Oral  SpO2: 98% 98% 93% 93%  Weight:      Height:       General: sitting up in bed, in no acute distress Resp: normal respiratory effort, able to use incentive spirometer without issue MSK: LUE in sling. Dressing CDI. Able to flex, extend, and abduct all fingers of left hand. Distal sensation intact though patient notes decreased sensation to 4th and 5th fingers.   LABS  Results for orders placed or performed during the hospital encounter of 09/30/23 (from the past 24 hour(s))  CBC     Status: Abnormal   Collection Time: 10/01/23  3:45 AM  Result Value Ref Range   WBC 24.5 (H) 4.0 - 10.5 K/uL   RBC 2.77 (L) 3.87 - 5.11 MIL/uL   Hemoglobin 8.1 (L) 12.0 - 15.0 g/dL   HCT 36.6 (L) 44.0 - 34.7 %   MCV 83.4 80.0 - 100.0 fL   MCH 29.2 26.0 - 34.0 pg   MCHC 35.1 30.0 - 36.0 g/dL   RDW 42.5 95.6 - 38.7 %   Platelets 60 (L) 150 - 400 K/uL   nRBC 0.4 (H) 0.0 - 0.2 %  Basic metabolic panel     Status: Abnormal   Collection Time: 10/01/23  3:45 AM  Result Value Ref Range   Sodium 138 135 - 145 mmol/L   Potassium 4.3 3.5 - 5.1 mmol/L   Chloride 109 98 - 111 mmol/L   CO2 23 22 - 32 mmol/L   Glucose, Bld 113 (H) 70 - 99 mg/dL   BUN 8 6 - 20 mg/dL   Creatinine, Ser 5.64 0.44 - 1.00 mg/dL   Calcium 8.4 (L) 8.9 - 10.3 mg/dL   GFR,  Estimated >33 >29 mL/min   Anion gap 6 5 - 15    DG Shoulder Left Port  Result Date: 09/30/2023 CLINICAL DATA:  Status post left shoulder replacement. EXAM: LEFT SHOULDER COMPARISON:  None Available. FINDINGS: Glenohumeral arthroplasty in place. Glenohumeral alignment is difficult to accurately assess on provided views. No periprosthetic fracture. Recent postsurgical change includes air and edema in the soft tissues. IMPRESSION: Glenohumeral arthroplasty without immediate postoperative complication. Electronically Signed   By: Narda Rutherford M.D.   On: 09/30/2023 18:11    Assessment/Plan: Principal Problem:   S/P shoulder replacement, left  1 Day Post-Op s/p Procedure(s): TOTAL SHOULDER ARTHROPLASTY - WBC elevated today, looks like this is not out of the ordinary for her after surgery - complaining of shortness of breath this morning, will order chest x-ray and keep an eye on oxygenation today  Weightbearing: NWB LUE, sling at all times Insicional and dressing care: Reinforce dressings as needed Pain control: continue current regimen Follow - up plan: 2 weeks with Dr. Dion Saucier Dispo: pending progress  with OT today  Contact information:   Janine Ores, PA-C Weekdays 8-5  After hours and holidays please check [email or web address] for group call information for [facility]  Armida Sans 10/01/2023, 9:05 AM

## 2023-10-01 NOTE — Progress Notes (Signed)
Occupational Therapy Treatment Patient Details Name: Tina Mayo MRN: [id number] DOB: [birth year] Today's Date: 10/01/2023   History of present illness Patient is a 52 year old female who presented with L shoulder avascular necorsis. patient underwent L total shoulder replacement. PMH: anemia, CAD, depression, thrombocytopenia, pneumonia.   OT comments  Patient was significantly limited by pain with education on ice machine positioning, using call light, and need to restock ice in machine when it gets warm. Patient verbalized understanding. Patient continues to report discomfort in chest and noted to have cough this afternoon. Patient 's nurse aware. [name] was noted to be 90% on RA afer coughing spell. Patient's discharge plan remains appropriate at this time. OT will continue to follow acutely.        If plan is discharge home, recommend the following:  A little help with walking and/or transfers;A lot of help with bathing/dressing/bathroom;Assistance with cooking/housework;Direct supervision/assist for medications management;Assist for transportation;Help with stairs or ramp for entrance;Direct supervision/assist for financial management   Equipment Recommendations  None recommended by OT       Precautions / Restrictions Precautions Precautions: Shoulder Type of Shoulder Precautions: no shoulder ROM, ok for hand wrist and elbow ROM, Shoulder Interventions: Shoulder sling/immobilizer;Off for dressing/bathing/exercises;At all times Precaution Booklet Issued: Yes (comment) Restrictions Weight Bearing Restrictions: Yes LUE Weight Bearing: Non weight bearing       Mobility Bed Mobility Overal bed mobility: Needs Assistance             General bed mobility comments: patient was able to participate in long sitting in bed x2 for positioning of shoulder with pillow and ice machine             Balance Overall balance assessment: Mild deficits observed, not  formally tested         ADL either performed or assessed with clinical judgement   ADL           General ADL Comments: patient was educated on using ice v.s. heat with acute surgery. patient reporting increased pain in L shoulder with limited ability to participate in session. HR up to 110s to 120s in bed. patient reporting that she has a sickle cell trait and that her pain was impacting ability to participate in ADLs. patient was educated on ice machine use with increased educaiton on straps.      Cognition Arousal: Alert Behavior During Therapy: WFL for tasks assessed/performed Overall Cognitive Status: Within Functional Limits for tasks assessed            Shoulder Instructions Shoulder Instructions Correct positioning of sling/immobilizer: Maximal assistance Sling wearing schedule (on at all times/off for ADL's): Maximal assistance     General Comments      Pertinent Vitals/ Pain       Pain Assessment Pain Assessment: Faces Faces Pain Scale: Hurts whole lot Pain Location: L shoulder Pain Descriptors / Indicators: Discomfort, Grimacing Pain Intervention(s): Limited activity within patient's tolerance, Monitored during session, Repositioned, Ice applied         Frequency  Min 1X/week        Progress Toward Goals  OT Goals(current goals can now be found in the care plan section)  Progress towards OT goals: Not progressing toward goals - comment (pain impacting participation)     Plan         AM-PAC OT "6 Clicks" Daily Activity     Outcome Measure   Help from another person eating meals?: A Little Help from another person taking  care of personal grooming?: A Little Help from another person toileting, which includes using toliet, bedpan, or urinal?: A Little Help from another person bathing (including washing, rinsing, drying)?: A Little Help from another person to put on and taking off regular upper body clothing?: A Little Help from another person  to put on and taking off regular lower body clothing?: A Little 6 Click Score: 18    End of Session    OT Visit Diagnosis: Unsteadiness on feet (R26.81);Pain Pain - Right/Left: Left Pain - part of body: Shoulder   Activity Tolerance Patient limited by pain   Patient Left in bed;with call bell/phone within reach   Nurse Communication          Time: 6063-0160 OT Time Calculation (min): 30 min  Charges: OT General Charges $OT Visit: 1 Visit OT Treatments $Therapeutic Activity: 23-37 mins  Rosalio Loud, MS Acute Rehabilitation Department Office# [phone]   Selinda Flavin 10/01/2023, 3:29 PM

## 2023-10-01 NOTE — Progress Notes (Signed)
   10/01/23 0839  TOC Brief Assessment  Insurance and Status Reviewed  Patient has primary care physician Yes  Home environment has been reviewed Lives alone  Prior level of function: Independent at baseline  Prior/Current Home Services No current home services  Social Determinants of Health Reivew SDOH reviewed no interventions necessary  Readmission risk has been reviewed Yes  Transition of care needs no transition of care needs at this time

## 2023-10-02 ENCOUNTER — Inpatient Hospital Stay (HOSPITAL_COMMUNITY): Payer: Medicaid Other

## 2023-10-02 ENCOUNTER — Encounter (HOSPITAL_COMMUNITY): Payer: Self-pay | Admitting: Orthopedic Surgery

## 2023-10-02 LAB — URINALYSIS, ROUTINE W REFLEX MICROSCOPIC
Bilirubin Urine: NEGATIVE
Glucose, UA: NEGATIVE mg/dL
Hgb urine dipstick: NEGATIVE
Ketones, ur: NEGATIVE mg/dL
Leukocytes,Ua: NEGATIVE
Nitrite: NEGATIVE
Protein, ur: NEGATIVE mg/dL
Specific Gravity, Urine: >1.046 — ABNORMAL HIGH (ref 1.005–1.030)
pH: 5 (ref 5.0–8.0)

## 2023-10-02 LAB — BASIC METABOLIC PANEL
Anion gap: 8 (ref 5–15)
BUN: 8 mg/dL (ref 6–20)
CO2: 23 mmol/L (ref 22–32)
Calcium: 8.3 mg/dL — ABNORMAL LOW (ref 8.9–10.3)
Chloride: 107 mmol/L (ref 98–111)
Creatinine, Ser: 0.73 mg/dL (ref 0.44–1.00)
GFR, Estimated: >60 mL/min (ref >60)
Glucose, Bld: 118 mg/dL — ABNORMAL HIGH (ref 70–99)
Potassium: 3.5 mmol/L (ref 3.5–5.1)
Sodium: 138 mmol/L (ref 135–145)

## 2023-10-02 LAB — CBC
HCT: 22.4 % — ABNORMAL LOW (ref 36.0–46.0)
Hemoglobin: 7.9 g/dL — ABNORMAL LOW (ref 12.0–15.0)
MCH: 28.7 pg (ref 26.0–34.0)
MCHC: 35.3 g/dL (ref 30.0–36.0)
MCV: 81.5 fL (ref 80.0–100.0)
Platelets: 85 10*3/uL — ABNORMAL LOW (ref 150–400)
RBC: 2.75 MIL/uL — ABNORMAL LOW (ref 3.87–5.11)
RDW: 15.6 % — ABNORMAL HIGH (ref 11.5–15.5)
WBC: 33.6 10*3/uL — ABNORMAL HIGH (ref 4.0–10.5)
nRBC: 1.2 % — ABNORMAL HIGH (ref 0.0–0.2)

## 2023-10-02 LAB — PROCALCITONIN: Procalcitonin: 0.59 ng/mL

## 2023-10-02 MED ORDER — IOHEXOL 350 MG/ML SOLN
100.0000 mL | Freq: Once | INTRAVENOUS | Status: AC | PRN
  Administered 2023-10-02: 100 mL via INTRAVENOUS

## 2023-10-02 MED ORDER — IPRATROPIUM-ALBUTEROL 0.5-2.5 (3) MG/3ML IN SOLN
3.0000 mL | Freq: Three times a day (TID) | RESPIRATORY_TRACT | Status: DC
  Administered 2023-10-02 – 2023-10-06 (×11): 3 mL via RESPIRATORY_TRACT
  Filled 2023-10-02 (×11): qty 3

## 2023-10-02 MED ORDER — ACETAMINOPHEN 325 MG PO TABS
325.0000 mg | ORAL_TABLET | Freq: Four times a day (QID) | ORAL | Status: DC | PRN
  Administered 2023-10-02: 650 mg via ORAL
  Filled 2023-10-02 (×2): qty 2

## 2023-10-02 MED ORDER — SODIUM CHLORIDE 0.9 % IV SOLN
2.0000 g | INTRAVENOUS | Status: DC
  Administered 2023-10-02 – 2023-10-04 (×3): 2 g via INTRAVENOUS
  Filled 2023-10-02 (×3): qty 20

## 2023-10-02 MED ORDER — METRONIDAZOLE 500 MG/100ML IV SOLN
500.0000 mg | Freq: Two times a day (BID) | INTRAVENOUS | Status: DC
  Administered 2023-10-02 – 2023-10-05 (×6): 500 mg via INTRAVENOUS
  Filled 2023-10-02 (×6): qty 100

## 2023-10-02 MED ORDER — IPRATROPIUM-ALBUTEROL 0.5-2.5 (3) MG/3ML IN SOLN
3.0000 mL | Freq: Four times a day (QID) | RESPIRATORY_TRACT | Status: DC
  Administered 2023-10-02: 3 mL via RESPIRATORY_TRACT
  Filled 2023-10-02: qty 3

## 2023-10-02 NOTE — Progress Notes (Addendum)
Subjective: 2 Days Post-Op s/p Procedure(s): TOTAL SHOULDER ARTHROPLASTY   Patient is alert, oriented. Denies CP, Calf pain. No nausea/vomiting.   Patient states cough has worsened, so far non-productive. Continues to have shortness of breath, worse overnight.  Has a history of HAP vs. Aspiration pneumonia after last shoulder surgery. So far afebrile, sleeping with 4L of O2 overnight.   Pain has so far been severe as block is starting to wear off.    Objective:  PE: VITALS:   Vitals:   10/01/23 2223 10/02/23 0031 10/02/23 0034 10/02/23 0450  BP: 105/67 97/62  108/63  Pulse: (!) 123 (!) 117  (!) 108  Resp: (!) 22 18  18   Temp:  100.1 F (37.8 C) 100.1 F (37.8 C) 99.6 F (37.6 C)  TempSrc:  Oral Oral Oral  SpO2: 91% 94%  93%  Weight:      Height:       General: sitting up in bed, in no acute distress Resp: normal respiratory effort, able to use incentive spirometer without issue. Wheezes on exam. MSK: LUE in sling. Dressing CDI. Able to flex, extend, and abduct all fingers of left hand. Distal sensation intact   LABS  Results for orders placed or performed during the hospital encounter of 09/30/23 (from the past 24 hour(s))  CBC     Status: Abnormal   Collection Time: 10/02/23  3:46 AM  Result Value Ref Range   WBC 33.6 (H) 4.0 - 10.5 K/uL   RBC 2.75 (L) 3.87 - 5.11 MIL/uL   Hemoglobin 7.9 (L) 12.0 - 15.0 g/dL   HCT 95.2 (L) 84.1 - 32.4 %   MCV 81.5 80.0 - 100.0 fL   MCH 28.7 26.0 - 34.0 pg   MCHC 35.3 30.0 - 36.0 g/dL   RDW 40.1 (H) 02.7 - 25.3 %   Platelets 85 (L) 150 - 400 K/uL   nRBC 1.2 (H) 0.0 - 0.2 %  Basic metabolic panel     Status: Abnormal   Collection Time: 10/02/23  3:46 AM  Result Value Ref Range   Sodium 138 135 - 145 mmol/L   Potassium 3.5 3.5 - 5.1 mmol/L   Chloride 107 98 - 111 mmol/L   CO2 23 22 - 32 mmol/L   Glucose, Bld 118 (H) 70 - 99 mg/dL   BUN 8 6 - 20 mg/dL   Creatinine, Ser 6.64 0.44 - 1.00 mg/dL   Calcium 8.3 (L) 8.9 - 10.3  mg/dL   GFR, Estimated >40 >34 mL/min   Anion gap 8 5 - 15    DG Shoulder 1V Left  Result Date: 10/01/2023 CLINICAL DATA:  Left shoulder arthroplasty EXAM: LEFT SHOULDER COMPARISON:  Prior radiographs of the left shoulder 09/30/2023 FINDINGS: Stable surgical changes of left shoulder arthroplasty. No evidence of hardware complication or malalignment. Low inspiratory volumes with pulmonary vascular congestion and probable left basilar atelectasis. IMPRESSION: Stable surgical changes of left shoulder arthroplasty. Electronically Signed   By: Malachy Moan M.D.   On: 10/01/2023 13:22   DG Chest 1 View  Result Date: 10/01/2023 CLINICAL DATA:  Status post left shoulder replacement EXAM: CHEST  1 VIEW COMPARISON:  Left shoulder radiograph obtained yesterday FINDINGS: Low inspiratory volumes with nonspecific patchy bibasilar airspace opacities. Mild pulmonary vascular congestion. Cardiac and mediastinal contours are unchanged. Surgical changes of left shoulder arthroplasty. More remote surgical changes of prior right shoulder arthroplasty. IMPRESSION: 1. Low inspiratory volumes with bibasilar airspace opacities favored to reflect atelectasis. 2. Pulmonary vascular  congestion without overt edema. 3. Surgical changes of bilateral shoulder joint arthroplasty procedures. Electronically Signed   By: Malachy Moan M.D.   On: 10/01/2023 13:18   DG Shoulder Left Port  Result Date: 09/30/2023 CLINICAL DATA:  Status post left shoulder replacement. EXAM: LEFT SHOULDER COMPARISON:  None Available. FINDINGS: Glenohumeral arthroplasty in place. Glenohumeral alignment is difficult to accurately assess on provided views. No periprosthetic fracture. Recent postsurgical change includes air and edema in the soft tissues. IMPRESSION: Glenohumeral arthroplasty without immediate postoperative complication. Electronically Signed   By: Narda Rutherford M.D.   On: 09/30/2023 18:11    Assessment/Plan: Principal  Problem:   S/P shoulder replacement, left Active Problems:   Cough  2 Days Post-Op s/p Procedure(s): TOTAL SHOULDER ARTHROPLASTY - Acute blood loss anemia in patient without true sickle cell diagnosis, patient states she is sickle cell trait. Hbg 10.8 pre-op> 8.1 post-op Day 1 > 7.9 today. Will continue to watch  Weightbearing: NWB LUE, sling at all times. Ice to operative site. Insicional and dressing care: Reinforce dressings as needed Pain control: continue current regimen Follow - up plan: 2 weeks with Dr. Dion Saucier  Shortness of breath/Cough: - patient has history of HAP vs. Aspiration pneumonia after last shoulder surgery in 2020.  - WBC trending up 24.5 yesterday, 33.6 today. Currently afebrile - chest x-ray yesterday shows atelectasis - CT angio order today as symptoms are worsening - will likely get medicine service involved later this morning based on Chest CT results - No personal or family history of DVT/PE   Contact information:   Janine Ores, PA-C Weekdays 8-5  After hours and holidays please check [email or web address] for group call information for [facility]  Armida Sans 10/02/2023, 6:46 AM

## 2023-10-02 NOTE — Plan of Care (Signed)
  Problem: Coping: Goal: Level of anxiety will decrease Outcome: Progressing   Problem: Pain Management: Goal: General experience of comfort will improve Outcome: Progressing   Problem: Safety: Goal: Ability to remain free from injury will improve Outcome: Progressing

## 2023-10-02 NOTE — Progress Notes (Signed)
   10/02/23 1833  Assess: MEWS Score  Temp (!) 101 F (38.3 C)  BP 113/66  Pulse Rate (!) 105  Resp 20  Level of Consciousness Alert  SpO2 94 %  O2 Device Nasal Cannula  O2 Flow Rate (L/min) 3 L/min  Assess: MEWS Score  MEWS Temp 1  MEWS Systolic 0  MEWS Pulse 1  MEWS RR 0  MEWS LOC 0  MEWS Score 2  MEWS Score Color Yellow  Assess: if the MEWS score is Yellow or Red  Does the patient meet 2 or more of the SIRS criteria? Yes  Does the patient have a confirmed or suspected source of infection? No  MEWS guidelines implemented  No, previously yellow, continue vital signs every 4 hours  Assess: SIRS CRITERIA  SIRS Temperature  1  SIRS Pulse 1  SIRS Respirations  0  SIRS WBC 0  SIRS Score Sum  2   Patient vitals rechecked and still MEWS but temp and HR increased from prev. Informed Dr. Salvadore Oxford. Patient not in distress.

## 2023-10-02 NOTE — Progress Notes (Signed)
Occupational Therapy Treatment Patient Details Name: Shamecka Deschler MRN: [id number] DOB: November 16, 1971 Today's Date: 10/02/2023   History of present illness Patient is a 52 year old female who presented with L shoulder avascular necorsis. patient underwent L total shoulder replacement. PMH: anemia, CAD, depression, thrombocytopenia, pneumonia.   OT comments  Pt in bed upon therapy arrival. Reports 9/10 pain level in left shoulder. Pain  meds provided during session. Session focused on functional mobility, activity tolerance, endurance, patient education regarding L shoulder precautions, and LUE A/ROM. Pt required increased VC while  completing HEP due to drowsiness. Required encouragement to sit up in recliner during session. Declined to remain seated in recliner after session. Education provided throughout session regarding the importance of getting out of bed and sitting up to help with lungs, breathing, endurance, activity tolerance, and overall strength. Required max VC and mod A to complete HEP with LUE.       If plan is discharge home, recommend the following:  A little help with walking and/or transfers;A lot of help with bathing/dressing/bathroom;Assistance with cooking/housework;Direct supervision/assist for medications management;Assist for transportation;Help with stairs or ramp for entrance;Direct supervision/assist for financial management   Equipment Recommendations  None recommended by OT       Precautions / Restrictions Precautions Precautions: Shoulder Type of Shoulder Precautions: no shoulder ROM, ok for hand wrist and elbow ROM, Shoulder Interventions: Shoulder sling/immobilizer;Off for dressing/bathing/exercises;At all times Precaution Booklet Issued: Yes (comment) Precaution Comments: handout and education provided at OT evaluation Required Braces or Orthoses: Sling Restrictions Weight Bearing Restrictions: Yes LUE Weight Bearing: Non weight bearing        Mobility Bed Mobility Overal bed mobility: Needs Assistance Bed Mobility: Supine to Sit, Sit to Supine     Supine to sit: Contact guard, HOB elevated, Used rails Sit to supine: Contact guard assist   General bed mobility comments: increased time and VC for sequencing provided to complete activity    Transfers Overall transfer level: Needs assistance Equipment used: None Transfers: Sit to/from Stand, Bed to chair/wheelchair/BSC Sit to Stand: Contact guard assist     Step pivot transfers: Supervision     General transfer comment: Pt held onto IV pole while walking into bathroom and back to bed.     Balance Overall balance assessment: No apparent balance deficits (not formally assessed)                    Cognition Arousal: Lethargic, Suspect due to medications Behavior During Therapy: WFL for tasks assessed/performed Overall Cognitive Status: Within Functional Limits for tasks assessed                 Exercises General Exercises - Upper Extremity Elbow Extension: AAROM, Left, 5 reps, Seated Wrist Flexion: AAROM, Left, 10 reps, Seated Wrist Extension: AAROM, Left, 10 reps, Seated Digit Composite Flexion: AROM, Left, Seated, 15 reps Composite Extension: AROM, Left, 15 reps, Seated Other Exercises Other Exercises: seated, A/ROM scapular squeezes, 5X    Shoulder Instructions       General Comments HR remained elevated throughout session regardless if at rest or during activity130-135 bpm, SpO293-96% on 2LO2. When O2 removed briefly, SpO2 decreased to 87-88%. Nursing aware.    Pertinent Vitals/ Pain       Pain Assessment Pain Assessment: 0-10 Pain Score: 9  Pain Location: L shoulder Pain Descriptors / Indicators: Discomfort, Grimacing Pain Intervention(s): Monitored during session, Limited activity within patient's tolerance, Repositioned, Patient requesting pain meds-RN notified, RN gave pain meds during session, Relaxation,  Ice applied  Home Living                                               Frequency  Min 1X/week        Progress Toward Goals  OT Goals(current goals can now be found in the care plan section)  Progress towards OT goals: Progressing toward goals            AM-PAC OT "6 Clicks" Daily Activity     Outcome Measure   Help from another person eating meals?: A Little Help from another person taking care of personal grooming?: A Little Help from another person toileting, which includes using toliet, bedpan, or urinal?: A Little Help from another person bathing (including washing, rinsing, drying)?: A Little Help from another person to put on and taking off regular upper body clothing?: A Lot Help from another person to put on and taking off regular lower body clothing?: Total 6 Click Score: 15    End of Session Equipment Utilized During Treatment: Other (comment) (sling)  OT Visit Diagnosis: Muscle weakness (generalized) (M62.81);Pain Pain - Right/Left: Left Pain - part of body: Shoulder   Activity Tolerance Patient tolerated treatment well;Patient limited by lethargy   Patient Left in bed;with call bell/phone within reach;with bed alarm set   Nurse Communication Mobility status;Other (comment) (HR during session)        Time: 1402-1501 OT Time Calculation (min): 59 min  Charges: OT General Charges $OT Visit: 1 Visit OT Treatments $Therapeutic Activity: 8-22 mins $Therapeutic Exercise: 38-52 mins  Limmie Patricia, OTR/L,CBIS  Supplemental OT - [facility] and [facility] Secure Chat Preferred    Tyneshia Stivers, Charisse March 10/02/2023, 9:16 PM

## 2023-10-02 NOTE — Progress Notes (Signed)
   10/02/23 1050  Assess: MEWS Score  Temp 100.1 F (37.8 C)  BP 112/68  MAP (mmHg) 81  Pulse Rate (!) 154  Resp 20  SpO2 (!) 82 % (RN notified)  O2 Device Room Air  Assess: MEWS Score  MEWS Temp 0  MEWS Systolic 0  MEWS Pulse 3  MEWS RR 0  MEWS LOC 0  MEWS Score 3  MEWS Score Color Yellow  Assess: if the MEWS score is Yellow or Red  Were vital signs accurate and taken at a resting state? Yes  Does the patient meet 2 or more of the SIRS criteria? Yes  Does the patient have a confirmed or suspected source of infection? No  MEWS guidelines implemented  Yes, yellow  Treat  MEWS Interventions Considered administering scheduled or prn medications/treatments as ordered  Take Vital Signs  Increase Vital Sign Frequency  Yellow: Q2hr x1, continue Q4hrs until patient remains green for 12hrs  Escalate  MEWS: Escalate Yellow: Discuss with charge nurse and consider notifying provider and/or RRT  Notify: Charge Nurse/RN  Name of Charge Nurse/RN Notified yes  Provider Notification  Provider Name/Title Landau/Blaine (pa)  Date Provider Notified 10/02/23  Time Provider Notified 1100  Method of Notification Page  Notification Reason Other (Comment) (yellow mews for HR 126/o2 low 80s-resp called and patient back up to 88 on 3L o2 and resp treatment. awaiting further orders,)  Provider response Other (Comment) (awaiting)  Assess: SIRS CRITERIA  SIRS Temperature  0  SIRS Pulse 1  SIRS Respirations  0  SIRS WBC 0  SIRS Score Sum  1

## 2023-10-02 NOTE — Consult Note (Addendum)
[facility] Initial Consultation Note  Tina Mayo WGN:[id number] DOB: 24-Sep-1971 DOA: 09/30/2023  PCP: Fleet Contras, MD   Requesting Physician: Teryl Lucy, MD and Janine Ores, [facility]  Reason for Consultation: Medical Management of Hypoxia  HPI: Tina Mayo is a 52 y.o. female with medical history significant for sickle cell trait, chronic iron deficiency anemia, chronic thrombocytopenia, ongoing tobacco abuse who was admitted to the hospital by the orthopedic surgery service and underwent successful left shoulder arthroplasty on 10/22 due to left shoulder avascular necrosis, and is now being seen in consultation at the request of the orthopedic surgery service due to leukocytosis and hypoxia.  Patient's postoperative course has been as expected, except for some worsening leukocytosis, and cough.  Patient was noted to have some progressive hypoxia, requiring 3 L/min of oxygen even when at rest.  Today she was noted to be saturating about 82% on room air.  Patient denies any chest pain, or fevers.  She developed a nonproductive cough postoperatively.  She denies nausea, dysuria, but does endorse some abdominal pain which she feels is due to her continuous coughing.  Denies wheezing currently, but says that she has intermittently been wheezing over the last couple of days.  Primary surgical service obtained chest x-ray yesterday which showed bilateral basilar opacities favored to be atelectasis.  This morning due to continued rising leukocytosis and hypoxia, CT angio chest was ordered and results are pending.  Hospitalist consultation was requested for the above.  Review of Systems: Please see HPI for pertinent positives and negatives. A complete 10 system review of systems are otherwise negative.  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   Bell's palsy    Depression    Sickle  cell trait (HCC)    Sleep apnea    no per pt   Past Surgical History:  Procedure Laterality Date   benign breast tumor removed     2006   BREAST BIOPSY Right 2014   benign   BREAST EXCISIONAL BIOPSY Right 2007   benign    CHOLECYSTECTOMY N/A 12/16/2018   Procedure: LAPAROSCOPIC CHOLECYSTECTOMY;  Surgeon: Berna Bue, MD;  Location: [facility] OR;  Service: General;  Laterality: N/A;   right ankle surgery     2010   TOTAL SHOULDER ARTHROPLASTY Right 06/29/2019   Procedure: TOTAL SHOULDER ARTHROPLASTY;  Surgeon: Teryl Lucy, MD;  Location: [facility];  Service: Orthopedics;  Laterality: Right;   TUBAL LIGATION      Social History:  reports that she has been smoking cigarettes. She uses smokeless tobacco. She reports current alcohol use. She reports that she does not use drugs.  Allergies  Allergen Reactions   Levaquin [Levofloxacin] Anaphylaxis   Ca Phosphate-Cholecalciferol Hives   [place]um Oil Hives   Penicillins Swelling    Family History  Problem Relation Age of Onset   Breast cancer Mother    Cancer Mother    Diabetes Mellitus II Father    Diabetes Father    Esophageal cancer Maternal Grandmother    Breast cancer Maternal Grandmother    Colon cancer Neg Hx    Colon polyps Neg Hx    Rectal cancer Neg Hx    Stomach cancer Neg Hx      Prior to Admission medications   Medication Sig Start Date End Date Taking? Authorizing Provider  acetaminophen (TYLENOL) 325 MG tablet Take 2 tablets (  650 mg total) by mouth every 6 (six) hours as needed for mild pain, fever or headache. 07/10/23  Yes Lonia Blood, MD  ARIPiprazole (ABILIFY) 20 MG tablet Take 20 mg by mouth daily.   Yes [provider]  cyanocobalamin (VITAMIN B12) 1000 MCG tablet Take 1 tablet (1,000 mcg total) by mouth daily. 07/10/23  Yes Lonia Blood, MD  ergocalciferol (VITAMIN D2) 1.25 MG (50000 UT) capsule Take 50,000 Units by mouth once a week.   Yes [provider]  ferrous sulfate  (FEROSUL) 325 (65 FE) MG tablet Take 1 tablet (325 mg total) by mouth daily with breakfast. 07/11/23  Yes Lonia Blood, MD  folic acid (FOLVITE) 1 MG tablet Take 1 tablet (1 mg total) by mouth daily. 07/11/23  Yes Lonia Blood, MD  ibuprofen (ADVIL) 200 MG tablet Take 200 mg by mouth every 6 (six) hours as needed for moderate pain.   Yes [provider]  methocarbamol (ROBAXIN) 500 MG tablet Take 500 mg by mouth 2 (two) times daily as needed for muscle spasms. 07/29/23  Yes [provider]  naproxen sodium (ALEVE) 220 MG tablet Take 440 mg by mouth daily as needed (pain).   Yes [provider]  sertraline (ZOLOFT) 100 MG tablet Take 100 mg by mouth at bedtime.   Yes [provider]  zolpidem (AMBIEN CR) 12.5 MG CR tablet Take 12.5 mg by mouth at bedtime as needed for sleep. 08/22/23  Yes [provider]    Physical Exam: BP 112/68 (BP Location: Right Arm)   Pulse (!) 126   Temp 100.1 F (37.8 C) (Oral)   Resp 20   Ht 5\' 7"  (1.702 m)   Wt 94.4 kg   LMP 01/12/2017   SpO2 92%   BMI 32.61 kg/m   General:  Alert, oriented, calm, in no acute distress, speaking in full sentences, intermittent dry cough.  She is wearing 3 L nasal cannula oxygen. Eyes: EOMI, clear conjuctivae, white sclerea Neck: supple, no masses, trachea mildline  Cardiovascular: RRR, no murmurs or rubs, no peripheral edema  Respiratory: clear to auscultation bilaterally with distant breath sounds, no wheezes, no crackles  Abdomen: soft, nontender, nondistended, normal bowel tones heard  Skin: dry, no rashes  Musculoskeletal: no joint effusions, normal range of motion  Psychiatric: appropriate affect, normal speech  Neurologic: extraocular muscles intact, clear speech, moving all extremities with intact sensorium         Recent Labs and Imaging Reviewed:  Basic Metabolic Panel: Recent Labs  Lab 10/01/23 0345 10/02/23 0346  NA 138 138  K 4.3 3.5  CL 109 107  CO2 23  23  GLUCOSE 113* 118*  BUN 8 8  CREATININE 0.84 0.73  CALCIUM 8.4* 8.3*   Liver Function Tests: No results for input(s): "AST", "ALT", "ALKPHOS", "BILITOT", "PROT", "ALBUMIN" in the last 168 hours. No results for input(s): "LIPASE", "AMYLASE" in the last 168 hours. No results for input(s): "AMMONIA" in the last 168 hours. CBC: Recent Labs  Lab 10/01/23 0345 10/02/23 0346  WBC 24.5* 33.6*  HGB 8.1* 7.9*  HCT 23.1* 22.4*  MCV 83.4 81.5  PLT 60* 85*   Cardiac Enzymes: No results for input(s): "CKTOTAL", "CKMB", "CKMBINDEX", "TROPONINI" in the last 168 hours.  BNP (last 3 results) Recent Labs    07/05/23 0135  BNP 300.7*    ProBNP (last 3 results) No results for input(s): "PROBNP" in the last 8760 hours.  CBG: No results for input(s): "GLUCAP" in  the last 168 hours.  Radiological Exams on Admission: DG Shoulder 1V Left  Result Date: 10/01/2023 CLINICAL DATA:  Left shoulder arthroplasty EXAM: LEFT SHOULDER COMPARISON:  Prior radiographs of the left shoulder 09/30/2023 FINDINGS: Stable surgical changes of left shoulder arthroplasty. No evidence of hardware complication or malalignment. Low inspiratory volumes with pulmonary vascular congestion and probable left basilar atelectasis. IMPRESSION: Stable surgical changes of left shoulder arthroplasty. Electronically Signed   By: Malachy Moan M.D.   On: 10/01/2023 13:22   DG Chest 1 View  Result Date: 10/01/2023 CLINICAL DATA:  Status post left shoulder replacement EXAM: CHEST  1 VIEW COMPARISON:  Left shoulder radiograph obtained yesterday FINDINGS: Low inspiratory volumes with nonspecific patchy bibasilar airspace opacities. Mild pulmonary vascular congestion. Cardiac and mediastinal contours are unchanged. Surgical changes of left shoulder arthroplasty. More remote surgical changes of prior right shoulder arthroplasty. IMPRESSION: 1. Low inspiratory volumes with bibasilar airspace opacities favored to reflect atelectasis.  2. Pulmonary vascular congestion without overt edema. 3. Surgical changes of bilateral shoulder joint arthroplasty procedures. Electronically Signed   By: Malachy Moan M.D.   On: 10/01/2023 13:18   DG Shoulder Left Port  Result Date: 09/30/2023 CLINICAL DATA:  Status post left shoulder replacement. EXAM: LEFT SHOULDER COMPARISON:  None Available. FINDINGS: Glenohumeral arthroplasty in place. Glenohumeral alignment is difficult to accurately assess on provided views. No periprosthetic fracture. Recent postsurgical change includes air and edema in the soft tissues. IMPRESSION: Glenohumeral arthroplasty without immediate postoperative complication. Electronically Signed   By: Narda Rutherford M.D.   On: 09/30/2023 18:11    Summary and Recommendations: Tina Mayo is a 52 y.o. female with medical history significant for sickle cell trait, chronic iron deficiency anemia, chronic thrombocytopenia, ongoing tobacco abuse who was admitted to the hospital by the orthopedic surgery service and underwent successful left shoulder arthroplasty on 10/22 due to left shoulder avascular necrosis, and is now being seen in consultation at the request of the orthopedic surgery service due to leukocytosis and hypoxia.  Acute hypoxic respiratory failure-etiology at this time remains unclear, though there is concern for an infectious source given her significant leukocytosis.  Differential is broad and includes atelectasis, COPD exacerbation, healthcare acquired pneumonia, PE, pleural effusion, etc. -Continue supplemental oxygen, wean as tolerated with goal O2 saturation greater than 90% -Given recent wheezing, will initiate scheduled DuoNebs, as well as as needed albuterol neb as there is likely a component of reactive airway disease -Agree with CTA chest to rule out PE and consolidation, will follow-up on results -Incentive spirometry and flutter valve  Leukocytosis-this has been worsening over the last  couple of days, in the setting of increased cough and hypoxia.  Certainly raises concern for pneumonia.  Will also check urinalysis, and obtain blood cultures. -Given worsening leukocytosis, will empirically start IV Rocephin and IV Flagyl, which would cover for HCAP, UTI, etc, in the setting of her penicillin allergy  Acute on chronic iron deficiency anemia-patient also has history of sickle cell trait.  Hemoglobin slightly lower than baseline, but appears to be stable.  Continue to monitor with daily labs.  Left-sided shoulder avascular necrosis-status post left shoulder arthroplasty 10/22 with Dr. Dion Saucier -Postoperative pain control and recovery per primary orthopedic service  Depression-Zoloft  Insomnia-Ambien as needed  Thank you for involving [place] in the care of your patient.  [facility] will continue to follow along with you.    Code Status: Full Code  Time spent: 75 minutes  Alexanderjames Berg Sharlette Dense MD [facility] Pager  [phone]  If 7PM-7AM, please contact night-coverage [email or web address] Password [facility]  10/02/2023, 12:38 PM

## 2023-10-03 DIAGNOSIS — Z96612 Presence of left artificial shoulder joint: Secondary | ICD-10-CM

## 2023-10-03 LAB — CBC
HCT: 18.7 % — ABNORMAL LOW (ref 36.0–46.0)
Hemoglobin: 6.7 g/dL — CL (ref 12.0–15.0)
MCH: 29.4 pg (ref 26.0–34.0)
MCHC: 35.8 g/dL (ref 30.0–36.0)
MCV: 82 fL (ref 80.0–100.0)
Platelets: 86 10*3/uL — ABNORMAL LOW (ref 150–400)
RBC: 2.28 MIL/uL — ABNORMAL LOW (ref 3.87–5.11)
RDW: 16.3 % — ABNORMAL HIGH (ref 11.5–15.5)
WBC: 31.3 10*3/uL — ABNORMAL HIGH (ref 4.0–10.5)
nRBC: 1.4 % — ABNORMAL HIGH (ref 0.0–0.2)

## 2023-10-03 LAB — BASIC METABOLIC PANEL
Anion gap: 7 (ref 5–15)
BUN: 10 mg/dL (ref 6–20)
CO2: 23 mmol/L (ref 22–32)
Calcium: 8 mg/dL — ABNORMAL LOW (ref 8.9–10.3)
Chloride: 107 mmol/L (ref 98–111)
Creatinine, Ser: 0.7 mg/dL (ref 0.44–1.00)
GFR, Estimated: >60 mL/min (ref >60)
Glucose, Bld: 114 mg/dL — ABNORMAL HIGH (ref 70–99)
Potassium: 3.4 mmol/L — ABNORMAL LOW (ref 3.5–5.1)
Sodium: 137 mmol/L (ref 135–145)

## 2023-10-03 LAB — PREPARE RBC (CROSSMATCH)

## 2023-10-03 MED ORDER — POLYETHYLENE GLYCOL 3350 17 G PO PACK
17.0000 g | PACK | Freq: Every day | ORAL | Status: DC
  Administered 2023-10-03: 17 g via ORAL
  Filled 2023-10-03 (×3): qty 1

## 2023-10-03 MED ORDER — SODIUM CHLORIDE 0.9% IV SOLUTION
Freq: Once | INTRAVENOUS | Status: AC
Start: 2023-10-03 — End: 2023-10-03

## 2023-10-03 MED ORDER — OXYCODONE HCL 5 MG PO TABS
10.0000 mg | ORAL_TABLET | ORAL | Status: DC | PRN
  Administered 2023-10-03 – 2023-10-06 (×12): 10 mg via ORAL
  Filled 2023-10-03 (×12): qty 2

## 2023-10-03 MED ORDER — MAGNESIUM CITRATE PO SOLN
1.0000 | Freq: Once | ORAL | Status: AC
  Administered 2023-10-03: 1 via ORAL
  Filled 2023-10-03: qty 296

## 2023-10-03 MED ORDER — GUAIFENESIN 100 MG/5ML PO LIQD: 15.0000 mL | Freq: Four times a day (QID) | ORAL | Status: DC | PRN

## 2023-10-03 MED ORDER — OXYCODONE HCL 5 MG PO TABS
5.0000 mg | ORAL_TABLET | ORAL | Status: DC | PRN
Start: 2023-10-03 — End: 2023-10-06

## 2023-10-03 MED ORDER — POTASSIUM CHLORIDE CRYS ER 20 MEQ PO TBCR
40.0000 meq | EXTENDED_RELEASE_TABLET | Freq: Two times a day (BID) | ORAL | Status: AC
  Administered 2023-10-03 (×2): 40 meq via ORAL
  Filled 2023-10-03 (×2): qty 2

## 2023-10-03 NOTE — Progress Notes (Signed)
OT Cancellation Note  Patient Details Name: Tina Mayo MRN: [id number] DOB: 06-Sep-1971   Cancelled Treatment:    Reason Eval/Treat Not Completed: Medical issues which prohibited therapy OT treatment held today. Pt with low hemoglobin. Received 1 unit of blood this AM and may receive more later today. Will follow up with pt tomorrow and complete OT treatment if able to participate.   Limmie Patricia, OTR/L,CBIS  Supplemental OT - [facility] and [facility] Secure Chat Preferred   10/03/2023, 5:16 PM

## 2023-10-03 NOTE — Evaluation (Signed)
SLP Cancellation Note  Patient Details Name: Tina Mayo MRN: [id number] DOB: 10-05-1971   Cancelled treatment:       Reason Eval/Treat Not Completed: Other (comment);Fatigue/lethargy limiting ability to participate (SLP eval held today. RN reports pt not feeling well and is to receive blood - . Will follow up with pt tomorrow morning.Thanks for this consult.   Rolena Infante, MS [facility] SLP Acute Rehab Services Office [phone]   Tina Mayo 10/03/2023, 5:49 PM

## 2023-10-03 NOTE — Progress Notes (Signed)
Patient Name: Tina Mayo           DOB: 1971/08/20  MRN: [id number]      Admission Date: 09/30/2023  Attending Provider: Teryl Lucy, MD  Primary Diagnosis: S/P shoulder replacement, left   Level of care: Med-Surg    CROSS COVER NOTE   Date of Service   10/03/2023   Tina Mayo, 52 y.o. female, was admitted on 09/30/2023 for S/P shoulder replacement, left.    HPI/Events of Note   Acute on Chronic Anemia, iron deficiency with hx of sickle cell trait Hemoglobin 7.9 -->  6.7.  No acute changes reported.  Hemodynamically stable. No melena, hematochezia, or other bleeding reported tonight.    Interventions/ Plan   Blood transfusion, 1 unit PRBC Recheck H&H after transfusion is completed, transfuse if HGB <7        Anthoney Harada, DNP, [employer] [facility] [place]

## 2023-10-03 NOTE — Progress Notes (Signed)
Subjective: 3 Days Post-Op s/p Procedure(s): TOTAL SHOULDER ARTHROPLASTY   Patient is alert, oriented, but falls asleep easily while I'm talking to her. Denies CP, Calf pain. No nausea/vomiting. Feels bloated and tired.  Continues to have non-productive cough and shortness of breath, no significant change since yesterday.    Objective:  PE: VITALS:   Vitals:   10/02/23 2047 10/03/23 0207 10/03/23 0527 10/03/23 0820  BP: 108/63 93/66 116/63   Pulse: (!) 119 (!) 104 (!) 107   Resp: 18 18 18    Temp: 99.5 F (37.5 C) 98.4 F (36.9 C) 98.8 F (37.1 C)   TempSrc: Oral Oral Oral   SpO2: 94% 96% 91% (!) 86%  Weight:      Height:       General: laying in bed, in no acute distress Resp: normal respiratory effort, nasal canula in place MSK: LUE in sling. Dressing CDI. Able to flex, extend, and abduct all fingers of left hand. Distal sensation intact   LABS  Results for orders placed or performed during the hospital encounter of 09/30/23 (from the past 24 hour(s))  Culture, blood (Routine X 2) w Reflex to ID Panel     Status: None (Preliminary result)   Collection Time: 10/02/23  1:49 PM   Specimen: BLOOD RIGHT ARM  Result Value Ref Range   Specimen Description      BLOOD RIGHT ARM Performed at [facility], [address], [place], [place] [address]    Special Requests      BOTTLES DRAWN AEROBIC ONLY Blood Culture adequate volume Performed at [facility], [address], [place], [place] [address]    Culture      NO GROWTH < 12 HOURS Performed at [facility], [address] [place], [place] [address]    Report Status PENDING   Urinalysis, Routine w reflex microscopic -Urine, Clean Catch     Status: Abnormal   Collection Time: 10/02/23  1:53 PM  Result Value Ref Range   Color, Urine YELLOW YELLOW   APPearance CLEAR CLEAR   Specific Gravity, Urine >1.046 (H) 1.005 - 1.030   pH 5.0 5.0 - 8.0   Glucose, UA NEGATIVE NEGATIVE mg/dL    Hgb urine dipstick NEGATIVE NEGATIVE   Bilirubin Urine NEGATIVE NEGATIVE   Ketones, ur NEGATIVE NEGATIVE mg/dL   Protein, ur NEGATIVE NEGATIVE mg/dL   Nitrite NEGATIVE NEGATIVE   Leukocytes,Ua NEGATIVE NEGATIVE  Culture, blood (Routine X 2) w Reflex to ID Panel     Status: None (Preliminary result)   Collection Time: 10/02/23 10:14 PM   Specimen: BLOOD  Result Value Ref Range   Specimen Description      BLOOD SITE NOT SPECIFIED Performed at [facility], [address], [place], [place] [address]    Special Requests      BOTTLES DRAWN AEROBIC AND ANAEROBIC Blood Culture adequate volume Performed at [facility], [address], [place], [place] [address]    Culture      NO GROWTH < 12 HOURS Performed at [facility], [address], [place], [place] [address]    Report Status PENDING   Basic metabolic panel     Status: Abnormal   Collection Time: 10/03/23  3:34 AM  Result Value Ref Range   Sodium 137 135 - 145 mmol/L   Potassium 3.4 (L) 3.5 - 5.1 mmol/L   Chloride 107 98 - 111 mmol/L   CO2 23  22 - 32 mmol/L   Glucose, Bld 114 (H) 70 - 99 mg/dL   BUN 10 6 - 20 mg/dL   Creatinine, Ser 8.29 0.44 - 1.00 mg/dL   Calcium 8.0 (L) 8.9 - 10.3 mg/dL   GFR, Estimated >56 >21 mL/min   Anion gap 7 5 - 15  CBC     Status: Abnormal   Collection Time: 10/03/23  3:34 AM  Result Value Ref Range   WBC 31.3 (H) 4.0 - 10.5 K/uL   RBC 2.28 (L) 3.87 - 5.11 MIL/uL   Hemoglobin 6.7 (LL) 12.0 - 15.0 g/dL   HCT 30.8 (L) 65.7 - 84.6 %   MCV 82.0 80.0 - 100.0 fL   MCH 29.4 26.0 - 34.0 pg   MCHC 35.8 30.0 - 36.0 g/dL   RDW 96.2 (H) 95.2 - 84.1 %   Platelets 86 (L) 150 - 400 K/uL   nRBC 1.4 (H) 0.0 - 0.2 %    CT Angio Chest Pulmonary Embolism (PE) W or WO Contrast  Result Date: 10/02/2023 CLINICAL DATA:  Status post left shoulder replacement, chest pain EXAM: CT ANGIOGRAPHY CHEST WITH CONTRAST TECHNIQUE: Multidetector CT imaging of the chest was  performed using the standard protocol during bolus administration of intravenous contrast. Multiplanar CT image reconstructions and MIPs were obtained to evaluate the vascular anatomy. RADIATION DOSE REDUCTION: This exam was performed according to the departmental dose-optimization program which includes automated exposure control, adjustment of the mA and/or kV according to patient size and/or use of iterative reconstruction technique. CONTRAST:  OMNIPAQUE IOHEXOL 350 MG/ML SOLN COMPARISON:  CT chest, abdomen and pelvis dated July 06, 2023; chest CTA dated August 08, 2022; chest CT dated July 01, 2019 FINDINGS: Cardiovascular: No evidence of central pulmonary embolus. Evaluation of the segmental and subsegmental pulmonary arteries is limited due to motion artifact and bolus timing. Normal heart size. No pericardial effusion. Normal caliber thoracic aorta with no significant atherosclerotic disease. Mediastinum/Nodes: Esophagus and thyroid are unremarkable. Mildly enlarged mediastinal and bilateral hilar lymph nodes. Reference left hilar lymph node measuring 14 mm in short axis on series 4, image 41. Lungs/Pleura: Central airways are patent. Left-greater-than-right consolidations which are more pronounced in the posterior lungs, worsened when compared with the prior. Upper Abdomen: No acute abnormality. Musculoskeletal: Soft tissue nodule of the right breast with calcification measuring 1.4 cm on series 4, image 75, unchanged when compared with priors likely benign adenoma. Interval left total shoulder replacement with adjacent soft tissue stranding and locules of air, consistent with expected postsurgical changes. Sclerotic lesion of the inferior sternum and lucent and sclerotic lesion of the manubrium, unchanged when compared with the recent prior, and likely due to avascular necrosis. Review of the MIP images confirms the above findings. IMPRESSION: 1. No evidence of central pulmonary embolus. Evaluation of  the segmental and subsegmental pulmonary arteries is limited due to motion artifact and bolus timing. 2. Left-greater-than-right consolidations, increased when compared with prior, likely due to recurrent infection or aspiration. Recommend follow-up chest CT in 3 months to ensure resolution. 3. Postsurgical changes of left total shoulder arthroplasty. 4. Sclerotic lesion of the inferior sternum and lucent and sclerotic lesion of the manubrium, unchanged when compared with the recent prior, and likely due to avascular necrosis. Electronically Signed   By: Allegra Lai M.D.   On: 10/02/2023 12:54   DG Shoulder 1V Left  Result Date: 10/01/2023 CLINICAL DATA:  Left shoulder arthroplasty EXAM: LEFT SHOULDER COMPARISON:  Prior radiographs of the left shoulder 09/30/2023  FINDINGS: Stable surgical changes of left shoulder arthroplasty. No evidence of hardware complication or malalignment. Low inspiratory volumes with pulmonary vascular congestion and probable left basilar atelectasis. IMPRESSION: Stable surgical changes of left shoulder arthroplasty. Electronically Signed   By: Malachy Moan M.D.   On: 10/01/2023 13:22   DG Chest 1 View  Result Date: 10/01/2023 CLINICAL DATA:  Status post left shoulder replacement EXAM: CHEST  1 VIEW COMPARISON:  Left shoulder radiograph obtained yesterday FINDINGS: Low inspiratory volumes with nonspecific patchy bibasilar airspace opacities. Mild pulmonary vascular congestion. Cardiac and mediastinal contours are unchanged. Surgical changes of left shoulder arthroplasty. More remote surgical changes of prior right shoulder arthroplasty. IMPRESSION: 1. Low inspiratory volumes with bibasilar airspace opacities favored to reflect atelectasis. 2. Pulmonary vascular congestion without overt edema. 3. Surgical changes of bilateral shoulder joint arthroplasty procedures. Electronically Signed   By: Malachy Moan M.D.   On: 10/01/2023 13:18    Assessment/Plan: Left  shoulder AVN  3 Days Post-Op s/p Procedure(s): TOTAL SHOULDER ARTHROPLASTY Weightbearing: NWB LUE, sling at all times. Ice to operative site. Insicional and dressing care: Reinforce dressings as needed Pain control: continue current regimen Follow - up plan: 2 weeks with Dr. Dion Saucier  Acute blood loss anemia in patient with baseline anemia and states she is sickle cell trait.  - Hbg 10.8 pre-op> 8.1 post-op Day 1 > 7.9 Day 2 > 6.7 this morning - appreciate medicine this morning who have ordered blood transfusion, 1 unit PRBC to be given this morning - febrile yesterday afternoon, remains tachycardic this morning, appreciate medicine management for hypoxia. WBC trending down   Constipation: - patient non-distended abdomen but feels bloated, passing gas but no BM since surgery - will add on scheduled miralax this morning   Contact information:   Janine Ores, PA-C Weekdays 8-5  After hours and holidays please check [email or web address] for group call information for [facility]  Armida Sans 10/03/2023, 8:51 AM

## 2023-10-03 NOTE — Progress Notes (Signed)
PROGRESS NOTE    Tina Mayo  FAO:[id number] DOB: 1971-03-27 DOA: 09/30/2023 PCP: Fleet Contras, MD    Brief Narrative:  Patient is 52 year old female with history of sickle cell trait, chronic iron-deficiency anemia, chronic thrombocytopenia and a smoker who was admitted for elective left shoulder arthroplasty that she underwent on 10/22 for avascular necrosis.  Postoperatively, patient started having shortness of breath and hypoxemia with 82% on room air with mobility.  Chest x-ray and CT angiogram consistent with bilateral basilar pneumonia. Medicine consulted for co management.   Subjective: Patient seen in the morning rounds.  Patient complained of dry cough.  Afebrile overnight.  She has upper abdominal discomfort.  Last bowel movement was 4 days ago.  Passing flatus.  Assessment & Plan:   Bilateral lower lobe pneumonia with hypoxemia: Antibiotics to treat bacterial pneumonia, patient was started on Rocephin and Flagyl that will continue today. Chest physiotherapy, incentive spirometry, deep breathing exercises, sputum induction, mucolytic's and bronchodilators. Sputum cultures, blood cultures,  Supplemental oxygen to keep saturations more than 90%. Speech therapy consultation, though there is no evidence of clinical aspiration. Mobilize with PT OT.  Out of bed.  Pain medications.  Bowel regimen.  Acute on chronic iron-deficiency anemia: Worsened with acute illness, recent surgery.  Hemoglobin was less than 7.  1 unit transfusion today.  Hypokalemia: Replace.  Monitor levels.  Left shoulder avascular necrosis: Status post left shoulder arthroplasty 10/22.  Postoperative pain control.  Laxative regimen.  Additional dose of magnesium citrate today.  PT OT.  Thank you for involving [place] in this patient's care.  We will continue to follow until clinical improvement.   DVT prophylaxis: SCD's Start: 09/30/23 1814   Code Status: Full code Family Communication: None at the  bedside Disposition Plan: Status is: Inpatient Remains inpatient appropriate because: Significant hypoxemia, pneumonia     Consultants:  TRH  Procedures:  Left total shoulder  Antimicrobials:  Rocephin and Flagyl 10/24---     Objective: Vitals:   10/03/23 0820 10/03/23 0950 10/03/23 1100 10/03/23 1106  BP:  108/68  100/66  Pulse:  (!) 112  (!) 108  Resp:  18  14  Temp:  98.8 F (37.1 C) 98.7 F (37.1 C) 98.7 F (37.1 C)  TempSrc:  Oral Oral Oral  SpO2: (!) 86% 92%  99%  Weight:      Height:        Intake/Output Summary (Last 24 hours) at 10/03/2023 1319 Last data filed at 10/03/2023 0648 Gross per 24 hour  Intake 380 ml  Output 0 ml  Net 380 ml   Filed Weights   09/30/23 0951  Weight: 94.4 kg    Examination:  General exam: Appears anxious.  In mild distress due to pain. Respiratory system: Clear to auscultation.  Poor inspiratory effort. SpO2: 99 % O2 Flow Rate (L/min): 3 L/min FiO2 (%): 32 %  Cardiovascular system: S1 & S2 heard, RRR. No JVD, murmurs, rubs, gallops or clicks. No pedal edema. Gastrointestinal system: Abdomen is nondistended, soft and nontender. No organomegaly or masses felt. Normal bowel sounds heard. Central nervous system: Alert and oriented. No focal neurological deficits. Extremities: Symmetric 5 x 5 power. Skin: Left shoulder immediate postop.  On sling.    Data Reviewed: I have personally reviewed following labs and imaging studies  CBC: Recent Labs  Lab 10/01/23 0345 10/02/23 0346 10/03/23 0334  WBC 24.5* 33.6* 31.3*  HGB 8.1* 7.9* 6.7*  HCT 23.1* 22.4* 18.7*  MCV 83.4 81.5 82.0  PLT 60*  85* 86*   Basic Metabolic Panel: Recent Labs  Lab 10/01/23 0345 10/02/23 0346 10/03/23 0334  NA 138 138 137  K 4.3 3.5 3.4*  CL 109 107 107  CO2 23 23 23   GLUCOSE 113* 118* 114*  BUN 8 8 10   CREATININE 0.84 0.73 0.70  CALCIUM 8.4* 8.3* 8.0*   GFR: Estimated Creatinine Clearance: 95.9 mL/min (by C-G formula based on SCr  of 0.7 mg/dL). Liver Function Tests: No results for input(s): "AST", "ALT", "ALKPHOS", "BILITOT", "PROT", "ALBUMIN" in the last 168 hours. No results for input(s): "LIPASE", "AMYLASE" in the last 168 hours. No results for input(s): "AMMONIA" in the last 168 hours. Coagulation Profile: No results for input(s): "INR", "PROTIME" in the last 168 hours. Cardiac Enzymes: No results for input(s): "CKTOTAL", "CKMB", "CKMBINDEX", "TROPONINI" in the last 168 hours. BNP (last 3 results) No results for input(s): "PROBNP" in the last 8760 hours. HbA1C: No results for input(s): "HGBA1C" in the last 72 hours. CBG: No results for input(s): "GLUCAP" in the last 168 hours. Lipid Profile: No results for input(s): "CHOL", "HDL", "LDLCALC", "TRIG", "CHOLHDL", "LDLDIRECT" in the last 72 hours. Thyroid Function Tests: No results for input(s): "TSH", "T4TOTAL", "FREET4", "T3FREE", "THYROIDAB" in the last 72 hours. Anemia Panel: No results for input(s): "VITAMINB12", "FOLATE", "FERRITIN", "TIBC", "IRON", "RETICCTPCT" in the last 72 hours. Sepsis Labs: Recent Labs  Lab 10/02/23 0346  PROCALCITON 0.59    Recent Results (from the past 240 hour(s))  Culture, blood (Routine X 2) w Reflex to ID Panel     Status: None (Preliminary result)   Collection Time: 10/02/23  1:49 PM   Specimen: BLOOD RIGHT ARM  Result Value Ref Range Status   Specimen Description   Final    BLOOD RIGHT ARM Performed at [facility], [address] [place] [address], [place], [place] [address]    Special Requests   Final    BOTTLES DRAWN AEROBIC ONLY Blood Culture adequate volume Performed at [facility], [address], [place], [place] [address]    Culture   Final    NO GROWTH < 12 HOURS Performed at [facility], [address], [place], [place] [address]    Report Status PENDING  Incomplete  Culture, blood (Routine X 2) w Reflex to ID Panel     Status: None (Preliminary result)   Collection Time: 10/02/23  10:14 PM   Specimen: BLOOD  Result Value Ref Range Status   Specimen Description   Final    BLOOD SITE NOT SPECIFIED Performed at [facility], [address] [place], [place] [address]    Special Requests   Final    BOTTLES DRAWN AEROBIC AND ANAEROBIC Blood Culture adequate volume Performed at [facility], [address] [place], [place] [address]    Culture   Final    NO GROWTH < 12 HOURS Performed at [facility], [address], [place], [place] [address]    Report Status PENDING  Incomplete         Radiology Studies: CT Angio Chest Pulmonary Embolism (PE) W or WO Contrast  Result Date: 10/02/2023 CLINICAL DATA:  Status post left shoulder replacement, chest pain EXAM: CT ANGIOGRAPHY CHEST WITH CONTRAST TECHNIQUE: Multidetector CT imaging of the chest was performed using the standard protocol during bolus administration of intravenous contrast. Multiplanar CT image reconstructions and MIPs were obtained to evaluate the vascular anatomy. RADIATION DOSE REDUCTION: This exam was performed according to the departmental dose-optimization  program which includes automated exposure control, adjustment of the mA and/or kV according to patient size and/or use of iterative reconstruction technique. CONTRAST:  OMNIPAQUE IOHEXOL 350 MG/ML SOLN COMPARISON:  CT chest, abdomen and pelvis dated July 06, 2023; chest CTA dated August 08, 2022; chest CT dated July 01, 2019 FINDINGS: Cardiovascular: No evidence of central pulmonary embolus. Evaluation of the segmental and subsegmental pulmonary arteries is limited due to motion artifact and bolus timing. Normal heart size. No pericardial effusion. Normal caliber thoracic aorta with no significant atherosclerotic disease. Mediastinum/Nodes: Esophagus and thyroid are unremarkable. Mildly enlarged mediastinal and bilateral hilar lymph nodes. Reference left hilar lymph node measuring 14 mm in short axis on  series 4, image 41. Lungs/Pleura: Central airways are patent. Left-greater-than-right consolidations which are more pronounced in the posterior lungs, worsened when compared with the prior. Upper Abdomen: No acute abnormality. Musculoskeletal: Soft tissue nodule of the right breast with calcification measuring 1.4 cm on series 4, image 75, unchanged when compared with priors likely benign adenoma. Interval left total shoulder replacement with adjacent soft tissue stranding and locules of air, consistent with expected postsurgical changes. Sclerotic lesion of the inferior sternum and lucent and sclerotic lesion of the manubrium, unchanged when compared with the recent prior, and likely due to avascular necrosis. Review of the MIP images confirms the above findings. IMPRESSION: 1. No evidence of central pulmonary embolus. Evaluation of the segmental and subsegmental pulmonary arteries is limited due to motion artifact and bolus timing. 2. Left-greater-than-right consolidations, increased when compared with prior, likely due to recurrent infection or aspiration. Recommend follow-up chest CT in 3 months to ensure resolution. 3. Postsurgical changes of left total shoulder arthroplasty. 4. Sclerotic lesion of the inferior sternum and lucent and sclerotic lesion of the manubrium, unchanged when compared with the recent prior, and likely due to avascular necrosis. Electronically Signed   By: Allegra Lai M.D.   On: 10/02/2023 12:54        Scheduled Meds:  sodium chloride   Intravenous Once   ARIPiprazole  20 mg Oral Daily   docusate sodium  100 mg Oral BID   ipratropium-albuterol  3 mL Nebulization TID   polyethylene glycol  17 g Oral Daily   potassium chloride  40 mEq Oral BID   sertraline  100 mg Oral QHS   sodium chloride flush  10 mL Intravenous Q12H   Continuous Infusions:  cefTRIAXone (ROCEPHIN)  IV 2 g (10/02/23 1601)   metronidazole 500 mg (10/03/23 0248)     LOS: 2 days    Time spent: 45  minutes    Dorcas Carrow, MD [facility]

## 2023-10-04 DIAGNOSIS — Z96612 Presence of left artificial shoulder joint: Secondary | ICD-10-CM | POA: Diagnosis not present

## 2023-10-04 LAB — CBC WITH DIFFERENTIAL/PLATELET
Abs Immature Granulocytes: 0 10*3/uL (ref 0.00–0.07)
Band Neutrophils: 0 %
Basophils Absolute: 0 10*3/uL (ref 0.0–0.1)
Basophils Relative: 0 %
Blasts: 0 %
Eosinophils Absolute: 0 10*3/uL (ref 0.0–0.5)
Eosinophils Relative: 0 %
HCT: 22.2 % — ABNORMAL LOW (ref 36.0–46.0)
Hemoglobin: 7.7 g/dL — ABNORMAL LOW (ref 12.0–15.0)
Lymphocytes Relative: 14 %
Lymphs Abs: 3.5 10*3/uL (ref 0.7–4.0)
MCH: 29.1 pg (ref 26.0–34.0)
MCHC: 34.7 g/dL (ref 30.0–36.0)
MCV: 83.8 fL (ref 80.0–100.0)
Metamyelocytes Relative: 0 %
Monocytes Absolute: 1 10*3/uL (ref 0.1–1.0)
Monocytes Relative: 4 %
Myelocytes: 0 %
Neutro Abs: 20.7 10*3/uL — ABNORMAL HIGH (ref 1.7–7.7)
Neutrophils Relative %: 82 %
Other: 0 %
Platelets: 87 10*3/uL — ABNORMAL LOW (ref 150–400)
Promyelocytes Relative: 0 %
RBC: 2.65 MIL/uL — ABNORMAL LOW (ref 3.87–5.11)
RDW: 16.4 % — ABNORMAL HIGH (ref 11.5–15.5)
WBC: 25.2 10*3/uL — ABNORMAL HIGH (ref 4.0–10.5)
nRBC: 10 /100{WBCs} — ABNORMAL HIGH
nRBC: 3.3 % — ABNORMAL HIGH (ref 0.0–0.2)

## 2023-10-04 LAB — PHOSPHORUS: Phosphorus: 2.6 mg/dL (ref 2.5–4.6)

## 2023-10-04 LAB — BASIC METABOLIC PANEL
Anion gap: 7 (ref 5–15)
BUN: 9 mg/dL (ref 6–20)
CO2: 24 mmol/L (ref 22–32)
Calcium: 8.5 mg/dL — ABNORMAL LOW (ref 8.9–10.3)
Chloride: 110 mmol/L (ref 98–111)
Creatinine, Ser: 0.61 mg/dL (ref 0.44–1.00)
GFR, Estimated: >60 mL/min (ref >60)
Glucose, Bld: 113 mg/dL — ABNORMAL HIGH (ref 70–99)
Potassium: 3.9 mmol/L (ref 3.5–5.1)
Sodium: 141 mmol/L (ref 135–145)

## 2023-10-04 LAB — MAGNESIUM: Magnesium: 2.4 mg/dL (ref 1.7–2.4)

## 2023-10-04 MED ORDER — ASPIRIN 325 MG PO TABS
325.0000 mg | ORAL_TABLET | Freq: Two times a day (BID) | ORAL | Status: DC
  Administered 2023-10-04 – 2023-10-06 (×5): 325 mg via ORAL
  Filled 2023-10-04 (×5): qty 1

## 2023-10-04 MED ORDER — FERROUS SULFATE 325 (65 FE) MG PO TABS
325.0000 mg | ORAL_TABLET | Freq: Every day | ORAL | Status: DC
  Administered 2023-10-04 – 2023-10-06 (×3): 325 mg via ORAL
  Filled 2023-10-04 (×3): qty 1

## 2023-10-04 MED ORDER — NYSTATIN 100000 UNIT/ML MT SUSP
5.0000 mL | Freq: Four times a day (QID) | OROMUCOSAL | Status: DC
  Administered 2023-10-04 – 2023-10-06 (×3): 500000 [IU] via ORAL
  Filled 2023-10-04 (×6): qty 5

## 2023-10-04 NOTE — Evaluation (Signed)
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   Bell's palsy    Depression    Sickle cell trait (HCC)    Sleep apnea    no per pt   Past Surgical History:  Past Surgical History:  Procedure Laterality Date   benign breast tumor removed     2006   BREAST BIOPSY Right 2014   benign   BREAST EXCISIONAL BIOPSY Right 2007   benign    CHOLECYSTECTOMY N/A 12/16/2018   Procedure: LAPAROSCOPIC CHOLECYSTECTOMY;  Surgeon: Berna Bue, MD;  Location: [facility] OR;  Service: General;  Laterality: N/A;   right ankle surgery     2010   TOTAL SHOULDER ARTHROPLASTY Right 06/29/2019   Procedure: TOTAL SHOULDER ARTHROPLASTY;  Surgeon: Teryl Lucy, MD;  Location: [facility] ORS;  Service: Orthopedics;  Laterality: Right;   TUBAL LIGATION     HPI:  52 year old female admitted to [facility] long hospital with left shoulder vascular necrosis, she required total shoulder arthroplasty.  Postop complicated by low hemoglobin and concerns for pneumonia per imaging.  Imaging showed left more than right consolidation, likely recurrent aspiration; increased since 8/20/202 CT. patient also with history of anemia, tobacco use, thrombocytopenia, GERD.  Swallow valuation ordered due to pneumonia.    Assessment / Plan / Recommendation  Clinical Impression  Patient alert, partially sitting upright in bed.  She was willing to accept intake including few bites of graham cracker, applesauce, juice, and water.  No clinical indication of aspiration across all p.o. trials.  Patient does have right facial upper and lower asymmetry due to history of Bell's palsy  and this impacts her labial closure on the right.  Patient also reports occasional retention of food on the right, SLP advised she could tilt her head to the left if helpful to facilitate oral clearance.  SLP provided patient with toothbrush and paste and had her brush her tongue due to white patches observed.  Lingual surface coating continued despite oral care thus concerning for potential oral candidiasis.  Patient denies discomfort nor gustatory changes.  Remote history of reflux reported the patient denies this being an issue at this time.  Recommend continue diet as tolerated.  SLP took a picture of patient's oral cavity on her phone and advised she continue to monitor, advised MD to findings.  Reviewed importance of patient removing her dentures nightly and brushing dentures and her oral cavity for pulmonary health.  No SLP follow-up indicated, thanks for this consult, SLP Visit Diagnosis: Dysphagia, unspecified (R13.10)    Aspiration Risk  Mild aspiration risk;Other (comment) (Advised proper positioning)    Diet Recommendation Regular;Thin liquid    Liquid Administration via: Cup;Straw Medication Administration: Whole meds with liquid Supervision: Patient able to self feed Compensations: Slow rate;Small sips/bites Postural Changes: Seated upright at 90 degrees;Remain upright for at least 30 minutes after po intake    Other  Recommendations Oral Care Recommendations: Oral care BID    Recommendations for follow up therapy are one component of a multi-disciplinary discharge planning process, led by the attending physician.  Recommendations may be updated based on patient status, additional functional  criteria and insurance authorization.  Follow up Recommendations No SLP follow up      Assistance Recommended at Discharge    Functional Status Assessment Patient has not had a recent decline in their functional status  Frequency and Duration            Prognosis        Swallow  Study   General Date of Onset: 10/04/23 HPI: 52 year old female admitted to [facility] long hospital with left shoulder vascular necrosis, she required total shoulder arthroplasty.  Postop complicated by low hemoglobin and concerns for pneumonia per imaging.  Imaging showed left more than right consolidation, likely recurrent aspiration; increased since 8/20/202 CT. patient also with history of anemia, tobacco use, thrombocytopenia, GERD.  Swallow valuation ordered due to pneumonia. Type of Study: Bedside Swallow Evaluation Previous Swallow Assessment: None in the chart Diet Prior to this Study: Regular;Thin liquids (Level 0) Temperature Spikes Noted: No Respiratory Status: Nasal cannula History of Recent Intubation: No Behavior/Cognition: Alert;Cooperative;Pleasant mood Oral Cavity Assessment: Other (comment) (Appears with oral candidiasis) Oral Care Completed by SLP: Yes Oral Cavity - Dentition: Dentures, top;Dentures, bottom Vision: Functional for self-feeding Self-Feeding Abilities: Able to feed self Patient Positioning: Other (comment) (Partially upright in bed;  patient did not want to sit fully up despite cues.) Baseline Vocal Quality: Normal Volitional Cough: Strong Volitional Swallow: Able to elicit    Oral/Motor/Sensory Function Overall Oral Motor/Sensory Function: Mild impairment (Patient has history of Bell's palsy impacting her right face upper and lower) Facial ROM: Reduced right Facial Symmetry: Abnormal symmetry right Facial Strength: Reduced right Facial Sensation: Reduced right Lingual ROM: Reduced right Lingual Symmetry: Within Functional Limits Lingual Strength: Within Functional Limits Lingual Sensation: Within Functional Limits Velum: Within Functional Limits Mandible: Within Functional Limits   Ice Chips Ice chips: Not tested   Thin Liquid Thin Liquid: Within functional limits Presentation: Straw    Nectar Thick Nectar Thick Liquid: Not tested   Honey Thick  Honey Thick Liquid: Not tested   Puree Puree: Within functional limits Presentation: Self Fed;Spoon   Solid     Solid: Within functional limits Presentation: Self Fed      Chales Abrahams 10/04/2023,3:58 PM Rolena Infante, MS [facility] SLP Acute Rehab Services Office [phone]

## 2023-10-04 NOTE — Progress Notes (Signed)
Occupational Therapy Treatment Patient Details Name: Tina Mayo MRN: [id number] DOB: 03-03-1971 Today's Date: 10/04/2023   History of present illness Patient is a 52 year old female who presented with L shoulder avascular necorsis. patient underwent L total shoulder replacement. PMH: anemia, CAD, depression, thrombocytopenia, pneumonia.   OT comments  Patient was noted to have near syncopal episode during session with Bp of 123/67 mmhg pulse rate of 112 bpm and O2 95% on RA sitting in chair in hallway. Patient needed increased education on symptoms to monitor for, when to push self and when to take a break. Patient verbalized understanding. Patient reported plan is to transition home alone with 15 steps to enter house and " stay in bed at home". Patient was educated multiple times during session on how staying in bed for whole recovery was not the recommended plan. Patient reported " this is what I did when my family bailed on me with my other shoulder". Patient would continue to benefit from skilled OT services at this time while admitted and after d/c to address noted deficits in order to improve overall safety and independence in ADLs.        If plan is discharge home, recommend the following:  A little help with walking and/or transfers;A lot of help with bathing/dressing/bathroom;Assistance with cooking/housework;Direct supervision/assist for medications management;Assist for transportation;Help with stairs or ramp for entrance;Direct supervision/assist for financial management   Equipment Recommendations  None recommended by OT       Precautions / Restrictions Precautions Precautions: Shoulder Type of Shoulder Precautions: no shoulder ROM, ok for hand wrist and elbow ROM, Precaution Comments: handout and education provided at OT evaluation Required Braces or Orthoses: Sling Restrictions Weight Bearing Restrictions: Yes LUE Weight Bearing: Non weight bearing        Mobility Bed Mobility Overal bed mobility: Needs Assistance Bed Mobility: Supine to Sit, Sit to Supine     Supine to sit: Contact guard, HOB elevated, Used rails Sit to supine: Contact guard assist   General bed mobility comments: increased time and VC for sequencing provided to complete activity       Balance Overall balance assessment: No apparent balance deficits (not formally assessed)           ADL either performed or assessed with clinical judgement   ADL Overall ADL's : Needs assistance/impaired           General ADL Comments: Patient was motivated to participate in time out of bed. patient was noted to have waist strap of sling tied around shoulder strap.patient,  NT and nurse made aware of proper placement and provided with demonstration. patient strongly motivated to participate in practicing steps to be able to transition home. patient's o2 was 97% on RA and HR was 109bpm with BP of 105/68 mmhg prior to movement. patient was noted to be able to participate in functional mobility int oom and out into hallway patient was about half way to gym for stair practice with patient reporting light headedness then reporting vision changes. nurse receptionist brought chair over for patient to sit down. patient was then rolled in front of nursing stand and BP was checked 123/67 mmhg HR as 112 bp and O2 was 95% on RA. patient was able to walk back to room after rest break. patient declined to stay up out of bed.  nurse in room with patient at this time.      Cognition Arousal: Alert Behavior During Therapy: WFL for tasks assessed/performed Overall Cognitive Status:  Within Functional Limits for tasks assessed       General Comments: very motivated to get home. appears to have decreased insight to deficits and safety risks involved (dizziness)                   Pertinent Vitals/ Pain       Pain Assessment Pain Assessment: 0-10 Pain Score: 9  Pain Location: L  shoulder Pain Descriptors / Indicators: Discomfort, Grimacing Pain Intervention(s): Limited activity within patient's tolerance, Monitored during session, Repositioned, Ice applied, Patient requesting pain meds-RN notified, RN gave pain meds during session         Frequency  Min 1X/week        Progress Toward Goals  OT Goals(current goals can now be found in the care plan section)  Progress towards OT goals: Progressing toward goals     Plan         AM-PAC OT "6 Clicks" Daily Activity     Outcome Measure   Help from another person eating meals?: A Little Help from another person taking care of personal grooming?: A Little Help from another person toileting, which includes using toliet, bedpan, or urinal?: A Little Help from another person bathing (including washing, rinsing, drying)?: A Little Help from another person to put on and taking off regular upper body clothing?: A Lot Help from another person to put on and taking off regular lower body clothing?: Total 6 Click Score: 15    End of Session Equipment Utilized During Treatment: Gait belt;Other (comment) (sling)  OT Visit Diagnosis: Muscle weakness (generalized) (M62.81);Pain Pain - Right/Left: Left Pain - part of body: Shoulder   Activity Tolerance Patient tolerated treatment well   Patient Left in bed;with call bell/phone within reach;with bed alarm set   Nurse Communication Other (comment) (vitals during session, sling positioning)        Time: 1610-9604 OT Time Calculation (min): 27 min  Charges: OT General Charges $OT Visit: 1 Visit OT Treatments $Self Care/Home Management : 23-37 mins  Rosalio Loud, MS Acute Rehabilitation Department Office# [phone]   Selinda Flavin 10/04/2023, 12:42 PM

## 2023-10-04 NOTE — Evaluation (Signed)
Physical Therapy Evaluation Patient Details Name: Tina Mayo MRN: [id number] DOB: 13-Feb-1971 Today's Date: 10/04/2023  History of Present Illness  Patient is a 52 year old female who presented with L shoulder avascular necorsis. patient underwent L total shoulder replacement. PMH: anemia, CAD, depression, thrombocytopenia, pneumonia.  Clinical Impression  Pt admitted as above and presenting with functional mobility limitations 2* post op pain, restricted R UE and mild balance deficits with ambulation.  Pt plans dc home with intermittent assist of family/friends.        If plan is discharge home, recommend the following: Assistance with cooking/housework;Assist for transportation;Help with stairs or ramp for entrance   Can travel by private vehicle        Equipment Recommendations None recommended by PT  Recommendations for Other Services       Functional Status Assessment Patient has had a recent decline in their functional status and demonstrates the ability to make significant improvements in function in a reasonable and predictable amount of time.     Precautions / Restrictions Precautions Precautions: Shoulder Type of Shoulder Precautions: no shoulder ROM, ok for hand wrist and elbow ROM, Shoulder Interventions: Shoulder sling/immobilizer;Off for dressing/bathing/exercises;At all times Precaution Comments: handout and education provided at OT evaluation Required Braces or Orthoses: Sling Restrictions Weight Bearing Restrictions: Yes LUE Weight Bearing: Non weight bearing      Mobility  Bed Mobility Overal bed mobility: Needs Assistance Bed Mobility: Supine to Sit, Sit to Supine     Supine to sit: Supervision Sit to supine: Supervision   General bed mobility comments: Increased time with use of bed rail to self assist    Transfers Overall transfer level: Needs assistance Equipment used: None Transfers: Sit to/from Stand Sit to Stand: Contact guard  assist, Supervision           General transfer comment: Pt held onto IV pole while walking into bathroom and back to bed.    Ambulation/Gait Ambulation/Gait assistance: Contact guard assist Gait Distance (Feet): 200 Feet Assistive device: IV Pole Gait Pattern/deviations: Step-through pattern, Decreased step length - right, Decreased step length - left, Shuffle, Wide base of support Gait velocity: decr     General Gait Details: mild instability with L UE in sling - R UE managing IV pole  Stairs            Wheelchair Mobility     Tilt Bed    Modified Rankin (Stroke Patients Only)       Balance Overall balance assessment: Mild deficits observed, not formally tested                                           Pertinent Vitals/Pain Pain Assessment Pain Assessment: 0-10 Pain Score: 8  Pain Location: L shoulder Pain Descriptors / Indicators: Discomfort, Grimacing Pain Intervention(s): Limited activity within patient's tolerance, Monitored during session, Premedicated before session, Ice applied    Home Living Family/patient expects to be discharged to:: Private residence Living Arrangements: Alone Available Help at Discharge: Family;Available PRN/intermittently Type of Home: Apartment Home Access: Stairs to enter Entrance Stairs-Rails: Can reach both Entrance Stairs-Number of Steps: 14   Home Layout: One level Home Equipment: None      Prior Function Prior Level of Function : Independent/Modified Independent;Driving;Working/employed             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  Extremity/Trunk Assessment   Upper Extremity Assessment Upper Extremity Assessment: Defer to OT evaluation    Lower Extremity Assessment Lower Extremity Assessment: Overall WFL for tasks assessed    Cervical / Trunk Assessment Cervical / Trunk Assessment: Other exceptions Cervical / Trunk Exceptions: bells palsy on R side of face   Communication   Communication Communication: No apparent difficulties  Cognition Arousal: Alert Behavior During Therapy: WFL for tasks assessed/performed Overall Cognitive Status: Within Functional Limits for tasks assessed                                 General Comments: very motivated to get home. appears to have decreased insight to deficits and safety risks involved (dizziness)        General Comments      Exercises     Assessment/Plan    PT Assessment Patient needs continued PT services  PT Problem List Decreased activity tolerance;Decreased mobility       PT Treatment Interventions DME instruction;Gait training;Stair training;Functional mobility training;Therapeutic activities;Therapeutic exercise;Patient/family education    PT Goals (Current goals can be found in the Care Plan section)  Acute Rehab PT Goals Patient Stated Goal: Regain IND PT Goal Formulation: With patient Time For Goal Achievement: 10/17/23 Potential to Achieve Goals: Good    Frequency Min 1X/week     Co-evaluation               AM-PAC PT "6 Clicks" Mobility  Outcome Measure Help needed turning from your back to your side while in a flat bed without using bedrails?: None Help needed moving from lying on your back to sitting on the side of a flat bed without using bedrails?: A Little Help needed moving to and from a bed to a chair (including a wheelchair)?: A Little Help needed standing up from a chair using your arms (e.g., wheelchair or bedside chair)?: A Little Help needed to walk in hospital room?: A Little Help needed climbing 3-5 steps with a railing? : A Lot 6 Click Score: 18    End of Session Equipment Utilized During Treatment: Gait belt Activity Tolerance: Patient tolerated treatment well Patient left: in bed;with call bell/phone within reach;with bed alarm set;with nursing/sitter in room Nurse Communication: Mobility status PT Visit Diagnosis: Difficulty  in walking, not elsewhere classified (R26.2);Pain Pain - Right/Left: Right Pain - part of body: Shoulder    Time: 1610-9604 PT Time Calculation (min) (ACUTE ONLY): 32 min   Charges:   PT Evaluation $PT Eval Low Complexity: 1 Low PT Treatments $Gait Training: 8-22 mins PT General Charges $$ ACUTE PT VISIT: 1 Visit         Mauro Kaufmann PT Acute Rehabilitation Services Pager [phone] Office [phone]   Leonides Minder 10/04/2023, 4:28 PM

## 2023-10-04 NOTE — Progress Notes (Signed)
PROGRESS NOTE    Tina Mayo  TKZ:[id number] DOB: 1971/07/10 DOA: 09/30/2023 PCP: Fleet Contras, MD    Brief Narrative:  Patient is 52 year old female with history of sickle cell trait, chronic iron-deficiency anemia, chronic thrombocytopenia and a smoker who was admitted for elective left shoulder arthroplasty that she underwent on 10/22 for avascular necrosis.  Postoperatively, patient started having shortness of breath and hypoxemia with 82% on room air with mobility.  Chest x-ray and CT angiogram consistent with bilateral basilar pneumonia. Medicine consulted for co management.   Subjective:  Patient seen in the morning rounds.  She is off oxygen.  She forced herself to walk around in the unit and also wanted to try some stairs, she got dizzy like lightheaded.  She is insisting on going home. Remains afebrile.  He still has dry cough.  Had a good bowel movement but he still has some pleuritic chest pain.  Diligently working on Facilities manager. WBC count trending down. She does not want to go to a SNF, she may take few more days to recover before she can go home independently.  Assessment & Plan:   Bilateral lower lobe pneumonia with hypoxemia:  Antibiotics to treat bacterial pneumonia, patient was started on Rocephin and Flagyl that will continue today.  Fairly stable today. Chest physiotherapy, incentive spirometry, deep breathing exercises, sputum induction, mucolytic's and bronchodilators. Sputum cultures, blood cultures, negative so far. Supplemental oxygen to keep saturations more than 90%.  On room air today. No clinical evidence of aspiration.Rober Minion with PT OT.  Out of bed.  Pain medications.  Bowel regimen.  Acute on chronic iron-deficiency anemia: Worsened with acute illness, recent surgery.  Hemoglobin was less than 7.  Appropriately responded to blood transfusion.  Hypokalemia: Replaced and adequate.  Left shoulder avascular necrosis: Status post left  shoulder arthroplasty 10/22.  Postoperative pain control.  Laxative regimen. PT OT.  Thank you for involving [place] in this patient's care.  We will continue to follow until clinical improvement.   DVT prophylaxis: SCD's Start: 09/30/23 1814   Code Status: Full code Family Communication: None at the bedside Disposition Plan: Status is: Inpatient Remains inpatient appropriate because: Significant hypoxemia, pneumonia     Consultants:  TRH  Procedures:  Left total shoulder  Antimicrobials:  Rocephin and Flagyl 10/24---     Objective: Vitals:   10/04/23 0111 10/04/23 0525 10/04/23 0759 10/04/23 1000  BP: 101/64 114/73  105/68  Pulse: 97   (!) 107  Resp: 18 17  20   Temp: 98.6 F (37 C) 98.6 F (37 C)  99.3 F (37.4 C)  TempSrc:      SpO2: 94% 92% 94% 95%  Weight:      Height:        Intake/Output Summary (Last 24 hours) at 10/04/2023 1259 Last data filed at 10/04/2023 1000 Gross per 24 hour  Intake 1000 ml  Output --  Net 1000 ml   Filed Weights   09/30/23 0951  Weight: 94.4 kg    Examination:  General exam: Appears mildly anxious but wants to walk around.  Working with occupational therapy. Respiratory system: Clear to auscultation.  Poor inspiratory effort. She was on room air on my exam. Cardiovascular system: S1 & S2 heard, RRR. No JVD, murmurs, rubs, gallops or clicks. No pedal edema. Gastrointestinal system: Soft.  Nontender.  Bowel sound present. Central nervous system: Alert and oriented. No focal neurological deficits. Extremities: Symmetric 5 x 5 power. Skin: Left shoulder immediate postop.  On sling.  Data Reviewed: I have personally reviewed following labs and imaging studies  CBC: Recent Labs  Lab 10/01/23 0345 10/02/23 0346 10/03/23 0334 10/04/23 0406  WBC 24.5* 33.6* 31.3* 25.2*  NEUTROABS  --   --   --  20.7*  HGB 8.1* 7.9* 6.7* 7.7*  HCT 23.1* 22.4* 18.7* 22.2*  MCV 83.4 81.5 82.0 83.8  PLT 60* 85* 86* 87*   Basic  Metabolic Panel: Recent Labs  Lab 10/01/23 0345 10/02/23 0346 10/03/23 0334 10/04/23 0406  NA 138 138 137 141  K 4.3 3.5 3.4* 3.9  CL 109 107 107 110  CO2 23 23 23 24   GLUCOSE 113* 118* 114* 113*  BUN 8 8 10 9   CREATININE 0.84 0.73 0.70 0.61  CALCIUM 8.4* 8.3* 8.0* 8.5*  MG  --   --   --  2.4  PHOS  --   --   --  2.6   GFR: Estimated Creatinine Clearance: 95.9 mL/min (by C-G formula based on SCr of 0.61 mg/dL). Liver Function Tests: No results for input(s): "AST", "ALT", "ALKPHOS", "BILITOT", "PROT", "ALBUMIN" in the last 168 hours. No results for input(s): "LIPASE", "AMYLASE" in the last 168 hours. No results for input(s): "AMMONIA" in the last 168 hours. Coagulation Profile: No results for input(s): "INR", "PROTIME" in the last 168 hours. Cardiac Enzymes: No results for input(s): "CKTOTAL", "CKMB", "CKMBINDEX", "TROPONINI" in the last 168 hours. BNP (last 3 results) No results for input(s): "PROBNP" in the last 8760 hours. HbA1C: No results for input(s): "HGBA1C" in the last 72 hours. CBG: No results for input(s): "GLUCAP" in the last 168 hours. Lipid Profile: No results for input(s): "CHOL", "HDL", "LDLCALC", "TRIG", "CHOLHDL", "LDLDIRECT" in the last 72 hours. Thyroid Function Tests: No results for input(s): "TSH", "T4TOTAL", "FREET4", "T3FREE", "THYROIDAB" in the last 72 hours. Anemia Panel: No results for input(s): "VITAMINB12", "FOLATE", "FERRITIN", "TIBC", "IRON", "RETICCTPCT" in the last 72 hours. Sepsis Labs: Recent Labs  Lab 10/02/23 0346  PROCALCITON 0.59    Recent Results (from the past 240 hour(s))  Culture, blood (Routine X 2) w Reflex to ID Panel     Status: None (Preliminary result)   Collection Time: 10/02/23  1:49 PM   Specimen: BLOOD RIGHT ARM  Result Value Ref Range Status   Specimen Description   Final    BLOOD RIGHT ARM Performed at [facility], [address] [place], [place] [address]    Special Requests   Final    BOTTLES  DRAWN AEROBIC ONLY Blood Culture adequate volume Performed at [facility], [address], [place], [place] [address]    Culture   Final    NO GROWTH 2 DAYS Performed at [facility], [address], [place], [place] [address]    Report Status PENDING  Incomplete  Culture, blood (Routine X 2) w Reflex to ID Panel     Status: None (Preliminary result)   Collection Time: 10/02/23 10:14 PM   Specimen: BLOOD  Result Value Ref Range Status   Specimen Description   Final    BLOOD SITE NOT SPECIFIED Performed at [facility], [address] [place], [place] [address]    Special Requests   Final    BOTTLES DRAWN AEROBIC AND ANAEROBIC Blood Culture adequate volume Performed at [facility], [address], [place], [place] [address]    Culture   Final    NO GROWTH 2 DAYS Performed at [facility], [address], [place], [place] [address]    Report Status PENDING  Incomplete         Radiology Studies: No results found.      Scheduled Meds:  ARIPiprazole  20 mg Oral Daily   aspirin  325 mg Oral BID   docusate sodium  100 mg Oral BID   ferrous sulfate  325 mg Oral Q breakfast   ipratropium-albuterol  3 mL Nebulization TID   polyethylene glycol  17 g Oral Daily   sertraline  100 mg Oral QHS   sodium chloride flush  10 mL Intravenous Q12H   Continuous Infusions:  cefTRIAXone (ROCEPHIN)  IV 2 g (10/03/23 1428)   metronidazole 500 mg (10/04/23 0247)     LOS: 3 days    Time spent: 35 minutes    Dorcas Carrow, MD [facility]

## 2023-10-04 NOTE — Progress Notes (Addendum)
Subjective: 4 Days Post-Op s/p Procedure(s): TOTAL SHOULDER ARTHROPLASTY   Patient is alert, oriented, worked with OT this morning but felt dizzy and lightheaded. Denies CP, Calf pain. No nausea/vomiting. Feeling slightly better since blood transfusion, but still doing just fair. Continues to have non-productive cough, shortness of breath has improved. Did have a BM yesterday, does not want more miralax today. Working on incentive spirometry in room.   Objective:  PE: VITALS:   Vitals:   10/04/23 0111 10/04/23 0525 10/04/23 0759 10/04/23 1000  BP: 101/64 114/73  105/68  Pulse: 97   (!) 107  Resp: 18 17  20   Temp: 98.6 F (37 C) 98.6 F (37 C)  99.3 F (37.4 C)  TempSrc:      SpO2: 94% 92% 94% 95%  Weight:      Height:       General: laying in bed, in no acute distress Resp: normal respiratory effort, dry cough after talking MSK: LUE in sling. Dressing CDI. Able to flex, extend, and abduct all fingers of left hand. Distal sensation intact   LABS  Results for orders placed or performed during the hospital encounter of 09/30/23 (from the past 24 hour(s))  Basic metabolic panel     Status: Abnormal   Collection Time: 10/04/23  4:06 AM  Result Value Ref Range   Sodium 141 135 - 145 mmol/L   Potassium 3.9 3.5 - 5.1 mmol/L   Chloride 110 98 - 111 mmol/L   CO2 24 22 - 32 mmol/L   Glucose, Bld 113 (H) 70 - 99 mg/dL   BUN 9 6 - 20 mg/dL   Creatinine, Ser 2.13 0.44 - 1.00 mg/dL   Calcium 8.5 (L) 8.9 - 10.3 mg/dL   GFR, Estimated >08 >65 mL/min   Anion gap 7 5 - 15  CBC with Differential/Platelet     Status: Abnormal   Collection Time: 10/04/23  4:06 AM  Result Value Ref Range   WBC 25.2 (H) 4.0 - 10.5 K/uL   RBC 2.65 (L) 3.87 - 5.11 MIL/uL   Hemoglobin 7.7 (L) 12.0 - 15.0 g/dL   HCT 78.4 (L) 69.6 - 29.5 %   MCV 83.8 80.0 - 100.0 fL   MCH 29.1 26.0 - 34.0 pg   MCHC 34.7 30.0 - 36.0 g/dL   RDW 28.4 (H) 13.2 - 44.0 %   Platelets 87 (L) 150 - 400 K/uL   nRBC 3.3 (H)  0.0 - 0.2 %   Neutrophils Relative % 82 %   Lymphocytes Relative 14 %   Monocytes Relative 4 %   Eosinophils Relative 0 %   Basophils Relative 0 %   Band Neutrophils 0 %   Metamyelocytes Relative 0 %   Myelocytes 0 %   Promyelocytes Relative 0 %   Blasts 0 %   nRBC 10 (H) 0 /100 WBC   Other 0 %   Neutro Abs 20.7 (H) 1.7 - 7.7 K/uL   Lymphs Abs 3.5 0.7 - 4.0 K/uL   Monocytes Absolute 1.0 0.1 - 1.0 K/uL   Eosinophils Absolute 0.0 0.0 - 0.5 K/uL   Basophils Absolute 0.0 0.0 - 0.1 K/uL   Abs Immature Granulocytes 0.00 0.00 - 0.07 K/uL  Magnesium     Status: None   Collection Time: 10/04/23  4:06 AM  Result Value Ref Range   Magnesium 2.4 1.7 - 2.4 mg/dL  Phosphorus     Status: None   Collection Time: 10/04/23  4:06 AM  Result  Value Ref Range   Phosphorus 2.6 2.5 - 4.6 mg/dL    No results found.  Assessment/Plan: Left shoulder AVN  4 Days Post-Op s/p Procedure(s): TOTAL SHOULDER ARTHROPLASTY Weightbearing: NWB LUE, sling at all times. Ice to operative site. Insicional and dressing care: Reinforce dressings as needed Pain control: continue current regimen Follow - up plan: 2 weeks with Dr. Dion Saucier DVT prophylaxis - has not been on any chemoprophylaxis due to anemia. Continue SCDs, added on aspirin this morning. Will continue to watch. No signs/symptoms of VTE at this time.   OT on board, also have add on PT eval   Acute blood loss anemia in patient with baseline anemia and states she is sickle cell trait.  - Hbg 10.8 pre-op> 8.1 post-op Day 1 > 7.9 Day 2 > 6.7 > 7.7 after blood transfusion yesterday - Have added back patient's home iron supplement.   - Afebrile for last 24 hours, WBC trending down, remains intermittently tachycardic, 95% oxygenation this morning on Room Air at rest, appreciate medicine management for hypoxia.    Contact information:   Janine Ores, Cordelia Poche [id number] 8-5  After hours and holidays please check [email or web address] for group call information for [facility]  Armida Sans 10/04/2023, 10:48 AM

## 2023-10-04 NOTE — Plan of Care (Signed)

## 2023-10-05 DIAGNOSIS — Z96612 Presence of left artificial shoulder joint: Secondary | ICD-10-CM | POA: Diagnosis not present

## 2023-10-05 LAB — CBC WITH DIFFERENTIAL/PLATELET
Abs Immature Granulocytes: 0.74 10*3/uL — ABNORMAL HIGH (ref 0.00–0.07)
Basophils Absolute: 0.1 10*3/uL (ref 0.0–0.1)
Basophils Relative: 0 %
Eosinophils Absolute: 0.6 10*3/uL — ABNORMAL HIGH (ref 0.0–0.5)
Eosinophils Relative: 2 %
HCT: 22.6 % — ABNORMAL LOW (ref 36.0–46.0)
Hemoglobin: 7.7 g/dL — ABNORMAL LOW (ref 12.0–15.0)
Immature Granulocytes: 3 %
Lymphocytes Relative: 15 %
Lymphs Abs: 3.7 10*3/uL (ref 0.7–4.0)
MCH: 29.2 pg (ref 26.0–34.0)
MCHC: 34.1 g/dL (ref 30.0–36.0)
MCV: 85.6 fL (ref 80.0–100.0)
Monocytes Absolute: 2.5 10*3/uL — ABNORMAL HIGH (ref 0.1–1.0)
Monocytes Relative: 10 %
Neutro Abs: 17.1 10*3/uL — ABNORMAL HIGH (ref 1.7–7.7)
Neutrophils Relative %: 70 %
Platelets: 119 10*3/uL — ABNORMAL LOW (ref 150–400)
RBC: 2.64 MIL/uL — ABNORMAL LOW (ref 3.87–5.11)
RDW: 17.2 % — ABNORMAL HIGH (ref 11.5–15.5)
WBC: 24.7 10*3/uL — ABNORMAL HIGH (ref 4.0–10.5)
nRBC: 14.5 % — ABNORMAL HIGH (ref 0.0–0.2)

## 2023-10-05 LAB — BASIC METABOLIC PANEL
Anion gap: 6 (ref 5–15)
BUN: 10 mg/dL (ref 6–20)
CO2: 24 mmol/L (ref 22–32)
Calcium: 8.6 mg/dL — ABNORMAL LOW (ref 8.9–10.3)
Chloride: 110 mmol/L (ref 98–111)
Creatinine, Ser: 0.55 mg/dL (ref 0.44–1.00)
GFR, Estimated: >60 mL/min (ref >60)
Glucose, Bld: 105 mg/dL — ABNORMAL HIGH (ref 70–99)
Potassium: 3.7 mmol/L (ref 3.5–5.1)
Sodium: 140 mmol/L (ref 135–145)

## 2023-10-05 MED ORDER — CEPHALEXIN 500 MG PO CAPS: 500.0000 mg | ORAL_CAPSULE | Freq: Three times a day (TID) | ORAL | 0 refills | Status: AC

## 2023-10-05 MED ORDER — METHOCARBAMOL 500 MG PO TABS: 500.0000 mg | ORAL_TABLET | Freq: Three times a day (TID) | ORAL | 0 refills | Status: DC | PRN

## 2023-10-05 MED ORDER — SENNA-DOCUSATE SODIUM 8.6-50 MG PO TABS: 2.0000 | ORAL_TABLET | Freq: Every day | ORAL | 1 refills | Status: DC

## 2023-10-05 MED ORDER — METRONIDAZOLE 500 MG PO TABS: 500.0000 mg | ORAL_TABLET | Freq: Two times a day (BID) | ORAL | 0 refills | Status: AC

## 2023-10-05 MED ORDER — OXYCODONE HCL 5 MG PO TABS: 5.0000 mg | ORAL_TABLET | ORAL | 0 refills | Status: DC | PRN

## 2023-10-05 MED ORDER — ASPIRIN 81 MG PO TBEC: 81.0000 mg | DELAYED_RELEASE_TABLET | Freq: Two times a day (BID) | ORAL | 0 refills | Status: DC

## 2023-10-05 MED ORDER — CEPHALEXIN 500 MG PO CAPS
500.0000 mg | ORAL_CAPSULE | Freq: Three times a day (TID) | ORAL | Status: DC
  Administered 2023-10-05 – 2023-10-06 (×4): 500 mg via ORAL
  Filled 2023-10-05 (×4): qty 1

## 2023-10-05 MED ORDER — ONDANSETRON HCL 4 MG PO TABS: 4.0000 mg | ORAL_TABLET | Freq: Three times a day (TID) | ORAL | 0 refills | Status: DC | PRN

## 2023-10-05 MED ORDER — METRONIDAZOLE 500 MG PO TABS
500.0000 mg | ORAL_TABLET | Freq: Two times a day (BID) | ORAL | Status: DC
  Administered 2023-10-05 – 2023-10-06 (×3): 500 mg via ORAL
  Filled 2023-10-05 (×3): qty 1

## 2023-10-05 NOTE — Progress Notes (Signed)
Subjective: 5 Days Post-Op s/p Procedure(s): TOTAL SHOULDER ARTHROPLASTY   Patient is alert, oriented, states she's feeling better today. Happy to have walked with PT yesterday, still struggling with pain but ready to go home.  Denies CP, Calf pain. No nausea/vomiting.   Objective:  PE: VITALS:   Vitals:   10/04/23 2157 10/05/23 0442 10/05/23 0747 10/05/23 0820  BP: 107/68 101/61    Pulse: (!) 103 90    Resp: 16 16    Temp: 99.6 F (37.6 C) 98.7 F (37.1 C)    TempSrc: Oral Oral    SpO2: 97% 100% 100% 97%  Weight:      Height:       General: laying in bed, in no acute distress Resp: normal respiratory effort, no cough on exam this morning. On room air MSK: LUE in sling. Dressing CDI. Able to flex, extend, and abduct all fingers of left hand. Distal sensation intact   LABS  Results for orders placed or performed during the hospital encounter of 09/30/23 (from the past 24 hour(s))  Basic metabolic panel     Status: Abnormal   Collection Time: 10/05/23  4:17 AM  Result Value Ref Range   Sodium 140 135 - 145 mmol/L   Potassium 3.7 3.5 - 5.1 mmol/L   Chloride 110 98 - 111 mmol/L   CO2 24 22 - 32 mmol/L   Glucose, Bld 105 (H) 70 - 99 mg/dL   BUN 10 6 - 20 mg/dL   Creatinine, Ser 2.44 0.44 - 1.00 mg/dL   Calcium 8.6 (L) 8.9 - 10.3 mg/dL   GFR, Estimated >01 >02 mL/min   Anion gap 6 5 - 15  CBC with Differential/Platelet     Status: Abnormal   Collection Time: 10/05/23  4:17 AM  Result Value Ref Range   WBC 24.7 (H) 4.0 - 10.5 K/uL   RBC 2.64 (L) 3.87 - 5.11 MIL/uL   Hemoglobin 7.7 (L) 12.0 - 15.0 g/dL   HCT 72.5 (L) 36.6 - 44.0 %   MCV 85.6 80.0 - 100.0 fL   MCH 29.2 26.0 - 34.0 pg   MCHC 34.1 30.0 - 36.0 g/dL   RDW 34.7 (H) 42.5 - 95.6 %   Platelets 119 (L) 150 - 400 K/uL   nRBC 14.5 (H) 0.0 - 0.2 %   Neutrophils Relative % 70 %   Neutro Abs 17.1 (H) 1.7 - 7.7 K/uL   Lymphocytes Relative 15 %   Lymphs Abs 3.7 0.7 - 4.0 K/uL   Monocytes Relative 10 %    Monocytes Absolute 2.5 (H) 0.1 - 1.0 K/uL   Eosinophils Relative 2 %   Eosinophils Absolute 0.6 (H) 0.0 - 0.5 K/uL   Basophils Relative 0 %   Basophils Absolute 0.1 0.0 - 0.1 K/uL   Immature Granulocytes 3 %   Abs Immature Granulocytes 0.74 (H) 0.00 - 0.07 K/uL    No results found.  Assessment/Plan: Left shoulder AVN  5 Days Post-Op s/p Procedure(s): TOTAL SHOULDER ARTHROPLASTY Weightbearing: NWB LUE, sling at all times. Ice to operative site. Insicional and dressing care: Reinforce dressings as needed Pain control: continue current regimen Follow - up plan: 2 weeks with Dr. Dion Saucier DVT prophylaxis - has not been on any chemoprophylaxis due to anemia. Continue SCDs, added on aspirin. Will continue to watch. No signs/symptoms of VTE at this time.   Worked with OT and PT yesterday.  Acute blood loss anemia in patient with baseline anemia and states she is  sickle cell trait.  - Hbg 10.8 pre-op> 8.1 post-op Day 1 > 7.9 Day 2 > 6.7 > 7.7 after blood transfusion. Hbg holding at 7.7 today. - Have added back patient's home iron supplement.   - Afebrile for last 24 hours, WBC trending down, remains intermittently tachycardic, 100% oxygenation this morning on Room Air at rest, appreciate medicine management for hypoxia.   Possibly discharge home later this afternoon if cleared by medicine.   Contact information:   Janine Ores, Cordelia Poche [id number] 8-5  After hours and holidays please check [email or web address] for group call information for [facility]  Armida Sans 10/05/2023, 10:00 AM

## 2023-10-05 NOTE — Progress Notes (Signed)
Physical Therapy Treatment Patient Details Name: Tina Mayo MRN: [id number] DOB: [birth year] Today's Date: 10/05/2023   History of Present Illness Patient is a 52 year old female who presented with L shoulder avascular necorsis. patient underwent L total shoulder replacement. PMH: anemia, CAD, depression, thrombocytopenia, pneumonia.    PT Comments  Pt very cooperative and progressing with mobility including negotiating stairs but with c/o mild dizziness after ambulation and with BP 94/66 but returning quickly to 113/69 in sitting and 123/69 after ambulating further. Pt reports hopeful for dc home tomorrow.   If plan is discharge home, recommend the following: Assistance with cooking/housework;Assist for transportation;Help with stairs or ramp for entrance   Can travel by private vehicle        Equipment Recommendations  None recommended by PT    Recommendations for Other Services       Precautions / Restrictions Precautions Precautions: Shoulder Type of Shoulder Precautions: no shoulder ROM, ok for hand wrist and elbow ROM, Shoulder Interventions: Shoulder sling/immobilizer;Off for dressing/bathing/exercises;At all times Precaution Booklet Issued: Yes (comment) Precaution Comments: handout and education provided at OT evaluation Required Braces or Orthoses: Sling Restrictions Weight Bearing Restrictions: Yes LUE Weight Bearing: Non weight bearing     Mobility  Bed Mobility Overal bed mobility: Needs Assistance Bed Mobility: Supine to Sit, Sit to Supine     Supine to sit: Supervision Sit to supine: Supervision   General bed mobility comments: Increased time with use of bed rail to self assist    Transfers Overall transfer level: Needs assistance Equipment used: None Transfers: Sit to/from Stand Sit to Stand: Supervision           General transfer comment: Wide BOS but no physical assist    Ambulation/Gait Ambulation/Gait assistance: Contact guard  assist, Supervision Gait Distance (Feet): 175 Feet Assistive device: IV Pole Gait Pattern/deviations: Step-through pattern, Decreased step length - right, Decreased step length - left, Shuffle, Wide base of support Gait velocity: decr     General Gait Details: mild instability with L UE in sling - R UE managing IV pole   Stairs Stairs: Yes Stairs assistance: Contact guard assist Stair Management: One rail Left, Forwards, Alternating pattern Number of Stairs: 14 General stair comments: minimal difficulty   Wheelchair Mobility     Tilt Bed    Modified Rankin (Stroke Patients Only)       Balance Overall balance assessment: Mild deficits observed, not formally tested                                          Cognition Arousal: Alert Behavior During Therapy: WFL for tasks assessed/performed Overall Cognitive Status: Within Functional Limits for tasks assessed                                 General Comments: very motivated to get home. appears to have decreased insight to deficits and safety risks involved (dizziness)        Exercises      General Comments General comments (skin integrity, edema, etc.): Watch BP      Pertinent Vitals/Pain Pain Assessment Pain Assessment: Faces Faces Pain Scale: Hurts even more Pain Location: L shoulder Pain Descriptors / Indicators: Discomfort, Grimacing Pain Intervention(s): Limited activity within patient's tolerance, Monitored during session, Premedicated before session, Ice applied    Home Living  Prior Function            PT Goals (current goals can now be found in the care plan section) Acute Rehab PT Goals Patient Stated Goal: Regain IND PT Goal Formulation: With patient Time For Goal Achievement: 10/17/23 Potential to Achieve Goals: Good Progress towards PT goals: Progressing toward goals    Frequency    Min 1X/week      PT Plan       Co-evaluation              AM-PAC PT "6 Clicks" Mobility   Outcome Measure  Help needed turning from your back to your side while in a flat bed without using bedrails?: None Help needed moving from lying on your back to sitting on the side of a flat bed without using bedrails?: A Little Help needed moving to and from a bed to a chair (including a wheelchair)?: A Little Help needed standing up from a chair using your arms (e.g., wheelchair or bedside chair)?: A Little Help needed to walk in hospital room?: A Little Help needed climbing 3-5 steps with a railing? : A Little 6 Click Score: 19    End of Session Equipment Utilized During Treatment: Gait belt Activity Tolerance: Patient tolerated treatment well Patient left: in bed;with call bell/phone within reach;with bed alarm set;with nursing/sitter in room Nurse Communication: Mobility status PT Visit Diagnosis: Difficulty in walking, not elsewhere classified (R26.2);Pain Pain - Right/Left: Right Pain - part of body: Shoulder     Time: 9147-8295 PT Time Calculation (min) (ACUTE ONLY): 36 min  Charges:    $Gait Training: 23-37 mins PT General Charges $$ ACUTE PT VISIT: 1 Visit                     Mauro Kaufmann PT Acute Rehabilitation Services Pager [phone] Office [phone]    Tina Mayo 10/05/2023, 4:07 PM

## 2023-10-05 NOTE — Progress Notes (Signed)
PROGRESS NOTE    Elaria Suddeth  MVH:[id number] DOB: 1970-12-13 DOA: 09/30/2023 PCP: Fleet Contras, MD    Brief Narrative:  Patient is 52 year old female with history of sickle cell trait, chronic iron-deficiency anemia, chronic thrombocytopenia and smoker who was admitted for elective left shoulder arthroplasty that she underwent on 10/22 for avascular necrosis.  Postoperatively, patient started having shortness of breath and hypoxemia with 82% on room air with mobility.  Chest x-ray and CT angiogram consistent with bilateral basilar pneumonia. Medicine consulted for co management.   Subjective:  Patient seen and examined.  Breathing much better.  She has been off oxygen since yesterday.  There is significant pain on left shoulder but she is working diligently so that she can go home.  Patient tells me she can have more support system by tomorrow. Remains afebrile.  Able to take deep breaths.  Blood cultures negative.  No sputum production WBC count trending down. Will change antibiotics to oral Keflex and Flagyl that she responded well and complete 7 days of therapy.  She will continue to do incentive spirometer at home.  She can be discharged home on oral antibiotics if deemed stable by surgery.  Assessment & Plan:   Bilateral lower lobe pneumonia with hypoxemia: Pleurisy. Some clinical improvement today.  WBC trending down.  Blood cultures negative. Currently on Rocephin and Flagyl, completed 3 days of IV antibiotics. Will change to oral antibiotics Keflex and Flagyl by mouth for additional 4 days. Continue chest physiotherapy and incentive spirometry. Continue to mobilize. No evidence of clinical aspiration as seen by speech therapy.  Acute on chronic iron-deficiency anemia: Worsened with acute illness, recent surgery.  Hemoglobin was less than 7.  Appropriately responded to blood transfusion.  Hemoglobin remains stable.  Hypokalemia: Replaced and adequate.  Left shoulder  avascular necrosis: Status post left shoulder arthroplasty 10/22.  Postoperative pain control.  Laxative regimen. PT OT.  Thank you for involving [place] in this patient's care.  We will continue to follow until clinical improvement. She can be transition to oral antibiotics, will send prescription to her pharmacy.   DVT prophylaxis: SCD's Start: 09/30/23 1814   Code Status: Full code Family Communication: None at the bedside Disposition Plan: Status is: Inpatient Remains inpatient appropriate because: Significant pain and difficulty mobility after surgery.     Consultants:  TRH  Procedures:  Left total shoulder  Antimicrobials:  Rocephin and Flagyl 10/24---     Objective: Vitals:   10/05/23 0442 10/05/23 0747 10/05/23 0820 10/05/23 1054  BP: 101/61   119/71  Pulse: 90   95  Resp: 16   16  Temp: 98.7 F (37.1 C)   98.1 F (36.7 C)  TempSrc: Oral     SpO2: 100% 100% 97% (!) 88%  Weight:      Height:        Intake/Output Summary (Last 24 hours) at 10/05/2023 1142 Last data filed at 10/05/2023 1000 Gross per 24 hour  Intake 1180.6 ml  Output --  Net 1180.6 ml   Filed Weights   09/30/23 0951  Weight: 94.4 kg    Examination:  General exam: Fairly comfortable at rest. Respiratory system: Clear to auscultation.  Poor inspiratory effort. On room air. Cardiovascular system: S1 & S2 heard, RRR. No JVD, murmurs, rubs, gallops or clicks. No pedal edema. Gastrointestinal system: Soft.  Nontender.  Bowel sound present. Central nervous system: Alert and oriented. No focal neurological deficits. Extremities: Symmetric 5 x 5 power. Skin: Left shoulder immediate postop.  On sling.    Data Reviewed: I have personally reviewed following labs and imaging studies  CBC: Recent Labs  Lab 10/01/23 0345 10/02/23 0346 10/03/23 0334 10/04/23 0406 10/05/23 0417  WBC 24.5* 33.6* 31.3* 25.2* 24.7*  NEUTROABS  --   --   --  20.7* 17.1*  HGB 8.1* 7.9* 6.7* 7.7* 7.7*  HCT  23.1* 22.4* 18.7* 22.2* 22.6*  MCV 83.4 81.5 82.0 83.8 85.6  PLT 60* 85* 86* 87* 119*   Basic Metabolic Panel: Recent Labs  Lab 10/01/23 0345 10/02/23 0346 10/03/23 0334 10/04/23 0406 10/05/23 0417  NA 138 138 137 141 140  K 4.3 3.5 3.4* 3.9 3.7  CL 109 107 107 110 110  CO2 23 23 23 24 24   GLUCOSE 113* 118* 114* 113* 105*  BUN 8 8 10 9 10   CREATININE 0.84 0.73 0.70 0.61 0.55  CALCIUM 8.4* 8.3* 8.0* 8.5* 8.6*  MG  --   --   --  2.4  --   PHOS  --   --   --  2.6  --    GFR: Estimated Creatinine Clearance: 95.9 mL/min (by C-G formula based on SCr of 0.55 mg/dL). Liver Function Tests: No results for input(s): "AST", "ALT", "ALKPHOS", "BILITOT", "PROT", "ALBUMIN" in the last 168 hours. No results for input(s): "LIPASE", "AMYLASE" in the last 168 hours. No results for input(s): "AMMONIA" in the last 168 hours. Coagulation Profile: No results for input(s): "INR", "PROTIME" in the last 168 hours. Cardiac Enzymes: No results for input(s): "CKTOTAL", "CKMB", "CKMBINDEX", "TROPONINI" in the last 168 hours. BNP (last 3 results) No results for input(s): "PROBNP" in the last 8760 hours. HbA1C: No results for input(s): "HGBA1C" in the last 72 hours. CBG: No results for input(s): "GLUCAP" in the last 168 hours. Lipid Profile: No results for input(s): "CHOL", "HDL", "LDLCALC", "TRIG", "CHOLHDL", "LDLDIRECT" in the last 72 hours. Thyroid Function Tests: No results for input(s): "TSH", "T4TOTAL", "FREET4", "T3FREE", "THYROIDAB" in the last 72 hours. Anemia Panel: No results for input(s): "VITAMINB12", "FOLATE", "FERRITIN", "TIBC", "IRON", "RETICCTPCT" in the last 72 hours. Sepsis Labs: Recent Labs  Lab 10/02/23 0346  PROCALCITON 0.59    Recent Results (from the past 240 hour(s))  Culture, blood (Routine X 2) w Reflex to ID Panel     Status: None (Preliminary result)   Collection Time: 10/02/23  1:49 PM   Specimen: BLOOD RIGHT ARM  Result Value Ref Range Status   Specimen  Description   Final    BLOOD RIGHT ARM Performed at [facility], [address] [place], [place] [address]    Special Requests   Final    BOTTLES DRAWN AEROBIC ONLY Blood Culture adequate volume Performed at [facility], [address], [place], [place] [address]    Culture   Final    NO GROWTH 3 DAYS Performed at [facility], [address] [place], [place] [address]    Report Status PENDING  Incomplete  Culture, blood (Routine X 2) w Reflex to ID Panel     Status: None (Preliminary result)   Collection Time: 10/02/23 10:14 PM   Specimen: BLOOD  Result Value Ref Range Status   Specimen Description   Final    BLOOD SITE NOT SPECIFIED Performed at [facility], [address] [place], [place] [address]    Special Requests   Final    BOTTLES DRAWN AEROBIC AND ANAEROBIC Blood Culture adequate volume Performed at [facility],  [address], [place], [place] [address]    Culture   Final    NO GROWTH 3 DAYS Performed at [facility], [address], [place], [place] [address]    Report Status PENDING  Incomplete         Radiology Studies: No results found.      Scheduled Meds:  ARIPiprazole  20 mg Oral Daily   aspirin  325 mg Oral BID   cephALEXin  500 mg Oral Q8H   docusate sodium  100 mg Oral BID   ferrous sulfate  325 mg Oral Q breakfast   ipratropium-albuterol  3 mL Nebulization TID   metroNIDAZOLE  500 mg Oral BID   nystatin  5 mL Oral QID   polyethylene glycol  17 g Oral Daily   sertraline  100 mg Oral QHS   sodium chloride flush  10 mL Intravenous Q12H   Continuous Infusions:     LOS: 4 days    Time spent: 35 minutes    Dorcas Carrow, MD [facility]

## 2023-10-05 NOTE — Plan of Care (Signed)
  Problem: Education: Goal: Knowledge of General Education information will improve Description: Including pain rating scale, medication(s)/side effects and non-pharmacologic comfort measures Outcome: Adequate for Discharge   Problem: Health Behavior/Discharge Planning: Goal: Ability to manage health-related needs will improve Outcome: Progressing   Problem: Clinical Measurements: Goal: Ability to maintain clinical measurements within normal limits will improve Outcome: Progressing Goal: Will remain free from infection Outcome: Progressing Goal: Diagnostic test results will improve Outcome: Progressing Goal: Respiratory complications will improve Outcome: Progressing Goal: Cardiovascular complication will be avoided Outcome: Progressing   Problem: Activity: Goal: Risk for activity intolerance will decrease Outcome: Adequate for Discharge   Problem: Nutrition: Goal: Adequate nutrition will be maintained Outcome: Adequate for Discharge   Problem: Coping: Goal: Level of anxiety will decrease Outcome: Progressing   Problem: Elimination: Goal: Will not experience complications related to bowel motility Outcome: Completed/Met Goal: Will not experience complications related to urinary retention Outcome: Completed/Met   Problem: Pain Management: Goal: General experience of comfort will improve Outcome: Progressing   Problem: Safety: Goal: Ability to remain free from injury will improve Outcome: Progressing   Problem: Skin Integrity: Goal: Risk for impaired skin integrity will decrease Outcome: Adequate for Discharge   Problem: Education: Goal: Knowledge of the prescribed therapeutic regimen will improve Outcome: Progressing Goal: Understanding of activity limitations/precautions following surgery will improve Outcome: Adequate for Discharge Goal: Individualized Educational Video(s) Outcome: Completed/Met   Problem: Activity: Goal: Ability to tolerate increased  activity will improve Outcome: Adequate for Discharge   Problem: Pain Management: Goal: Pain level will decrease with appropriate interventions Outcome: Progressing

## 2023-10-05 NOTE — Progress Notes (Signed)
Discussed discharge plan with patient. She feels she will have better support at home tomorrow, plan to keep inpatient today and discharge home tomorrow.   Janine Ores, PA-C

## 2023-10-05 NOTE — Progress Notes (Addendum)
Occupational Therapy Treatment Patient Details Name: Tina Mayo MRN: [id number] DOB: 1971-08-08 Today's Date: 10/05/2023   History of present illness Patient is a 52 year old female who presented with L shoulder avascular necorsis. patient underwent L total shoulder replacement. PMH: anemia, CAD, depression, thrombocytopenia, pneumonia.   OT comments  Patient was educated on techniques to don/doff sling while maintaining ROM restrictions. Patient needed min A and min problem solving cues to participate in task. Patient needed continued education on need to take sling off each day and participate in exercises as stated by MD. Patient at end of session agreed to have friend come help her with them 1x a day. Continue to have concerns over patient d/c home with limited support and limited carryover of education on shoulder restrictions. Patient would continue to benefit from skilled OT services at this time while admitted and after d/c to address noted deficits in order to improve overall safety and independence in ADLs.        If plan is discharge home, recommend the following:  A little help with walking and/or transfers;A lot of help with bathing/dressing/bathroom;Assistance with cooking/housework;Direct supervision/assist for medications management;Assist for transportation;Help with stairs or ramp for entrance;Direct supervision/assist for financial management   Equipment Recommendations  None recommended by OT       Precautions / Restrictions Precautions Precautions: Shoulder Type of Shoulder Precautions: no shoulder ROM, ok for hand wrist and elbow ROM, Shoulder Interventions: Shoulder sling/immobilizer;Off for dressing/bathing/exercises;At all times Precaution Booklet Issued: Yes (comment) Precaution Comments: handout and education provided at OT evaluation Required Braces or Orthoses: Sling Restrictions Weight Bearing Restrictions: Yes LUE Weight Bearing: Non weight  bearing       Mobility Bed Mobility Overal bed mobility: Needs Assistance Bed Mobility: Supine to Sit, Sit to Supine     Supine to sit: Supervision Sit to supine: Supervision   General bed mobility comments: Increased time with use of bed rail to self assist            ADL either performed or assessed with clinical judgement   ADL Overall ADL's : Needs assistance/impaired         General ADL Comments: patient declined to participate in grooming or bathing tasks reporting she already completed them at this time. patient was educate don how to don/doff sling with min A for problem solving cues and physical A for waist strap. patient would report "oh i will do my exercises each day " and then in next statement report " i am going to stay in bed with sling in place until my follow up". patient was educated on importance of elbow hand and wrist exercises multiple times a day for edema management and ROM of UE. patient verbalized understanding but continued to need education during session. patient asking if she can reach her arm straight out. patient was again educated on her shoudler restrictions. patient declined to sit up out of bed.      Cognition Arousal: Alert Behavior During Therapy: WFL for tasks assessed/performed Overall Cognitive Status: Within Functional Limits for tasks assessed             Shoulder Instructions Shoulder Instructions Donning/doffing sling/immobilizer: Minimal assistance (with cues for problem solving) Correct positioning of sling/immobilizer: Minimal assistance ROM for elbow, wrist and digits of operated UE: Supervision/safety (with increased time.) Sling wearing schedule (on at all times/off for ADL's): Supervision/safety     General Comments Watch BP    Pertinent Vitals/ Pain  Pain Assessment Pain Assessment: 0-10 Pain Score: 6  Pain Location: L shoulder Pain Descriptors / Indicators: Discomfort, Grimacing Pain Intervention(s):  Limited activity within patient's tolerance, Monitored during session, Repositioned, Ice applied         Frequency  7X/week (shoulder)        Progress Toward Goals  OT Goals(current goals can now be found in the care plan section)  Progress towards OT goals: Progressing toward goals     Plan         AM-PAC OT "6 Clicks" Daily Activity     Outcome Measure   Help from another person eating meals?: A Little Help from another person taking care of personal grooming?: A Little Help from another person toileting, which includes using toliet, bedpan, or urinal?: A Little Help from another person bathing (including washing, rinsing, drying)?: A Lot Help from another person to put on and taking off regular upper body clothing?: A Lot Help from another person to put on and taking off regular lower body clothing?: Total 6 Click Score: 14    End of Session Equipment Utilized During Treatment: Other (comment) (sling)  OT Visit Diagnosis: Muscle weakness (generalized) (M62.81);Pain Pain - Right/Left: Left Pain - part of body: Shoulder   Activity Tolerance Patient tolerated treatment well   Patient Left in bed;with call bell/phone within reach;with bed alarm set   Nurse Communication Other (comment) (ok to participate in session.)        Time: 1610-9604 OT Time Calculation (min): 27 min  Charges: OT General Charges $OT Visit: 1 Visit OT Treatments $Therapeutic Activity: 23-37 mins  Rosalio Loud, MS Acute Rehabilitation Department Office# [phone]   Selinda Flavin 10/05/2023, 4:11 PM

## 2023-10-06 ENCOUNTER — Other Ambulatory Visit (HOSPITAL_COMMUNITY): Payer: Self-pay

## 2023-10-06 ENCOUNTER — Ambulatory Visit: Payer: Medicaid Other | Attending: Physical Therapy | Admitting: Physical Therapy

## 2023-10-06 DIAGNOSIS — Z96612 Presence of left artificial shoulder joint: Secondary | ICD-10-CM | POA: Diagnosis not present

## 2023-10-06 LAB — BASIC METABOLIC PANEL
Anion gap: 7 (ref 5–15)
BUN: 10 mg/dL (ref 6–20)
CO2: 26 mmol/L (ref 22–32)
Calcium: 8.5 mg/dL — ABNORMAL LOW (ref 8.9–10.3)
Chloride: 104 mmol/L (ref 98–111)
Creatinine, Ser: 0.58 mg/dL (ref 0.44–1.00)
GFR, Estimated: >60 mL/min (ref >60)
Glucose, Bld: 104 mg/dL — ABNORMAL HIGH (ref 70–99)
Potassium: 3.5 mmol/L (ref 3.5–5.1)
Sodium: 137 mmol/L (ref 135–145)

## 2023-10-06 LAB — TYPE AND SCREEN
ABO/RH(D): O POS
Antibody Screen: NEGATIVE
Unit division: 0

## 2023-10-06 LAB — CBC WITH DIFFERENTIAL/PLATELET
Abs Immature Granulocytes: 0 10*3/uL (ref 0.00–0.07)
Band Neutrophils: 2 %
Basophils Absolute: 0 10*3/uL (ref 0.0–0.1)
Basophils Relative: 0 %
Blasts: 0 %
Eosinophils Absolute: 0.9 10*3/uL — ABNORMAL HIGH (ref 0.0–0.5)
Eosinophils Relative: 6 %
HCT: 23.5 % — ABNORMAL LOW (ref 36.0–46.0)
Hemoglobin: 7.9 g/dL — ABNORMAL LOW (ref 12.0–15.0)
Lymphocytes Relative: 13 %
Lymphs Abs: 1.9 10*3/uL (ref 0.7–4.0)
MCH: 29.3 pg (ref 26.0–34.0)
MCHC: 33.6 g/dL (ref 30.0–36.0)
MCV: 87 fL (ref 80.0–100.0)
Metamyelocytes Relative: 0 %
Monocytes Absolute: 1.2 10*3/uL — ABNORMAL HIGH (ref 0.1–1.0)
Monocytes Relative: 8 %
Myelocytes: 0 %
Neutro Abs: 10.8 10*3/uL — ABNORMAL HIGH (ref 1.7–7.7)
Neutrophils Relative %: 71 %
Other: 0 %
Platelets: 124 10*3/uL — ABNORMAL LOW (ref 150–400)
Promyelocytes Relative: 0 %
RBC: 2.7 MIL/uL — ABNORMAL LOW (ref 3.87–5.11)
RDW: 17.5 % — ABNORMAL HIGH (ref 11.5–15.5)
WBC: 14.8 10*3/uL — ABNORMAL HIGH (ref 4.0–10.5)
nRBC: 20.8 % — ABNORMAL HIGH (ref 0.0–0.2)
nRBC: 41 /100{WBCs} — ABNORMAL HIGH

## 2023-10-06 LAB — BPAM RBC
Blood Product Expiration Date: 202411202359
ISSUE DATE / TIME: 202410251742
Unit Type and Rh: 5100

## 2023-10-06 MED ORDER — ASPIRIN 81 MG PO TBEC
81.0000 mg | DELAYED_RELEASE_TABLET | Freq: Two times a day (BID) | ORAL | 0 refills | Status: AC
  Filled 2023-10-06: qty 60, 30d supply, fill #0

## 2023-10-06 MED ORDER — METHOCARBAMOL 500 MG PO TABS
500.0000 mg | ORAL_TABLET | Freq: Three times a day (TID) | ORAL | 0 refills | Status: AC | PRN
  Filled 2023-10-06: qty 30, 10d supply, fill #0

## 2023-10-06 MED ORDER — SENNA-DOCUSATE SODIUM 8.6-50 MG PO TABS
2.0000 | ORAL_TABLET | Freq: Every day | ORAL | 1 refills | Status: AC
  Filled 2023-10-06: qty 30, 15d supply, fill #0

## 2023-10-06 MED ORDER — ONDANSETRON HCL 4 MG PO TABS
4.0000 mg | ORAL_TABLET | Freq: Three times a day (TID) | ORAL | 0 refills | Status: AC | PRN
  Filled 2023-10-06: qty 10, 4d supply, fill #0

## 2023-10-06 MED ORDER — OXYCODONE HCL 5 MG PO TABS
5.0000 mg | ORAL_TABLET | ORAL | 0 refills | Status: AC | PRN
  Filled 2023-10-06: qty 40, 4d supply, fill #0

## 2023-10-06 NOTE — Progress Notes (Signed)
PROGRESS NOTE    Tina Mayo  WUJ:[id number] DOB: [birth year] DOA: 09/30/2023 PCP: Fleet Contras, MD    Brief Narrative:  Patient is 52 year old female with history of sickle cell trait, chronic iron-deficiency anemia, chronic thrombocytopenia and smoker who was admitted for elective left shoulder arthroplasty that she underwent on 10/22 for avascular necrosis.  Postoperatively, patient started having shortness of breath and hypoxemia with 82% on room air with mobility.  Chest x-ray and CT angiogram consistent with bilateral basilar pneumonia. Medicine consulted for co management.   Subjective:  Patient seen and examined.  He still has some difficulty taking deep breaths.  Remains on room air.  Afebrile.  WBC count trending down. Medically stable to discharge.  Antibiotics were sent to the pharmacy.  Assessment & Plan:   Bilateral lower lobe pneumonia with hypoxemia: Pleurisy. Some clinical improvement today.  WBC trending down.  Blood cultures negative. Completed 3 days of Rocephin and Flagyl.  Will continue 4 more days to complete 7 days of therapy. She will take incentive spirometer at home and continue to do it. Continue chest physiotherapy and incentive spirometry. Continue to mobilize. No evidence of clinical aspiration as seen by speech therapy.  Acute on chronic iron-deficiency anemia: Worsened with acute illness, recent surgery.  Hemoglobin was less than 7.  Appropriately responded to blood transfusion.  Hemoglobin remains stable.  Hypokalemia: Replaced and adequate.  Left shoulder avascular necrosis: Status post left shoulder arthroplasty 10/22.  Postoperative pain control.  Laxative regimen. PT OT.  Thank you for involving [place] in this patient's care.  We will continue to follow until clinical improvement. She can be transition to oral antibiotics and medically stable to discharge.   DVT prophylaxis: SCD's Start: 09/30/23 1814   Code Status: Full code Family  Communication: None at the bedside Disposition Plan: Status is: Inpatient Remains inpatient appropriate because: Stable for discharge.     Consultants:  TRH  Procedures:  Left total shoulder  Antimicrobials:  Rocephin and Flagyl 10/24---     Objective: Vitals:   10/05/23 1735 10/05/23 1824 10/05/23 2035 10/06/23 0641  BP:  106/61 107/73 100/73  Pulse:  99 97 81  Resp:  16 18 17   Temp:  98.9 F (37.2 C) 99.1 F (37.3 C) 98.1 F (36.7 C)  TempSrc:   Oral Oral  SpO2: 94% 90% 94% 95%  Weight:      Height:        Intake/Output Summary (Last 24 hours) at 10/06/2023 1022 Last data filed at 10/06/2023 0022 Gross per 24 hour  Intake 720 ml  Output --  Net 720 ml   Filed Weights   09/30/23 0951  Weight: 94.4 kg    Examination:  Fairly comfortable.  In mild distress on mobility especially on the left shoulder.  She has poor inspiratory effort.  On room air.  Mobilizing around.    Data Reviewed: I have personally reviewed following labs and imaging studies  CBC: Recent Labs  Lab 10/02/23 0346 10/03/23 0334 10/04/23 0406 10/05/23 0417 10/06/23 0406  WBC 33.6* 31.3* 25.2* 24.7* 14.8*  NEUTROABS  --   --  20.7* 17.1* 10.8*  HGB 7.9* 6.7* 7.7* 7.7* 7.9*  HCT 22.4* 18.7* 22.2* 22.6* 23.5*  MCV 81.5 82.0 83.8 85.6 87.0  PLT 85* 86* 87* 119* 124*   Basic Metabolic Panel: Recent Labs  Lab 10/02/23 0346 10/03/23 0334 10/04/23 0406 10/05/23 0417 10/06/23 0406  NA 138 137 141 140 137  K 3.5 3.4* 3.9 3.7 3.5  CL 107 107 110 110 104  CO2 23 23 24 24 26   GLUCOSE 118* 114* 113* 105* 104*  BUN 8 10 9 10 10   CREATININE 0.73 0.70 0.61 0.55 0.58  CALCIUM 8.3* 8.0* 8.5* 8.6* 8.5*  MG  --   --  2.4  --   --   PHOS  --   --  2.6  --   --    GFR: Estimated Creatinine Clearance: 95.9 mL/min (by C-G formula based on SCr of 0.58 mg/dL). Liver Function Tests: No results for input(s): "AST", "ALT", "ALKPHOS", "BILITOT", "PROT", "ALBUMIN" in the last 168 hours. No  results for input(s): "LIPASE", "AMYLASE" in the last 168 hours. No results for input(s): "AMMONIA" in the last 168 hours. Coagulation Profile: No results for input(s): "INR", "PROTIME" in the last 168 hours. Cardiac Enzymes: No results for input(s): "CKTOTAL", "CKMB", "CKMBINDEX", "TROPONINI" in the last 168 hours. BNP (last 3 results) No results for input(s): "PROBNP" in the last 8760 hours. HbA1C: No results for input(s): "HGBA1C" in the last 72 hours. CBG: No results for input(s): "GLUCAP" in the last 168 hours. Lipid Profile: No results for input(s): "CHOL", "HDL", "LDLCALC", "TRIG", "CHOLHDL", "LDLDIRECT" in the last 72 hours. Thyroid Function Tests: No results for input(s): "TSH", "T4TOTAL", "FREET4", "T3FREE", "THYROIDAB" in the last 72 hours. Anemia Panel: No results for input(s): "VITAMINB12", "FOLATE", "FERRITIN", "TIBC", "IRON", "RETICCTPCT" in the last 72 hours. Sepsis Labs: Recent Labs  Lab 10/02/23 0346  PROCALCITON 0.59    Recent Results (from the past 240 hour(s))  Culture, blood (Routine X 2) w Reflex to ID Panel     Status: None (Preliminary result)   Collection Time: 10/02/23  1:49 PM   Specimen: BLOOD RIGHT ARM  Result Value Ref Range Status   Specimen Description   Final    BLOOD RIGHT ARM Performed at [facility], [address] [place], [place] [address]    Special Requests   Final    BOTTLES DRAWN AEROBIC ONLY Blood Culture adequate volume Performed at [facility], [address], [place], [place] [address]    Culture   Final    NO GROWTH 4 DAYS Performed at [facility], [address], [place], [place] [address]    Report Status PENDING  Incomplete  Culture, blood (Routine X 2) w Reflex to ID Panel     Status: None (Preliminary result)   Collection Time: 10/02/23 10:14 PM   Specimen: BLOOD  Result Value Ref Range Status   Specimen Description   Final    BLOOD SITE NOT SPECIFIED Performed at [facility], [address], [place], [place] [address]    Special Requests   Final    BOTTLES DRAWN AEROBIC AND ANAEROBIC Blood Culture adequate volume Performed at [facility], [address], [place], [place] [address]    Culture   Final    NO GROWTH 4 DAYS Performed at [facility], [address] [place], [place] [address]    Report Status PENDING  Incomplete         Radiology Studies: No results found.      Scheduled Meds:  ARIPiprazole  20 mg Oral Daily   aspirin  325 mg Oral BID   cephALEXin  500 mg Oral Q8H   docusate sodium  100 mg Oral BID   ferrous sulfate  325 mg Oral Q breakfast   ipratropium-albuterol  3 mL Nebulization TID  metroNIDAZOLE  500 mg Oral BID   nystatin  5 mL Oral QID   polyethylene glycol  17 g Oral Daily   sertraline  100 mg Oral QHS   sodium chloride flush  10 mL Intravenous Q12H   Continuous Infusions:     LOS: 5 days    Time spent: 25 minutes    Dorcas Carrow, MD [facility]

## 2023-10-06 NOTE — Discharge Summary (Signed)
Discharge Summary  Patient ID: Tina Mayo MRN: [id number] DOB/AGE: 53-08-1971 52 y.o.  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 female who was admitted 09/30/2023 with a diagnosis of S/P shoulder replacement, left and went to the operating room on 09/30/2023 and underwent the above named procedures.    She was given perioperative antibiotics:  Anti-infectives (From admission, onward)    Start     Dose/Rate Route Frequency Ordered Stop   10/05/23 1000  metroNIDAZOLE (FLAGYL) tablet 500 mg        500 mg Oral 2 times daily 10/05/23 0739 10/10/23 0959   10/05/23 0830  cephALEXin (KEFLEX) capsule 500 mg        500 mg Oral Every 8 hours 10/05/23 0739 10/10/23 0559   10/05/23 0000  metroNIDAZOLE (FLAGYL) 500 MG tablet        500 mg Oral 2 times daily 10/05/23 1148 10/09/23 2359   10/05/23 0000  cephALEXin (KEFLEX) 500 MG capsule        500 mg Oral Every 8 hours 10/05/23 1148 10/09/23 2359   10/02/23 1400  cefTRIAXone (ROCEPHIN) 2 g in sodium chloride 0.9 % 100 mL IVPB  Status:  Discontinued        2 g 200 mL/hr over 30 Minutes Intravenous Every 24 hours 10/02/23 1259 10/05/23 0739   10/02/23 1400  metroNIDAZOLE (FLAGYL) IVPB 500 mg  Status:  Discontinued        500 mg 100 mL/hr over 60 Minutes Intravenous Every 12 hours 10/02/23 1259 10/05/23 0739   09/30/23 2000  ceFAZolin  (ANCEF) IVPB 2g/100 mL premix        2 g 200 mL/hr over 30 Minutes Intravenous Every 6 hours 09/30/23 1813 10/01/23 0842   09/30/23 1000  ceFAZolin (ANCEF) IVPB 2g/100 mL premix        2 g 200 mL/hr over 30 Minutes Intravenous On call to O.R. 09/30/23 4742 09/30/23 1400     .  She was given sequential compression devices, early ambulation, and aspirin for DVT prophylaxis.  After surgery on her shoulder patient developed dry cough as well as hypoxia.  She had significant leukocytosis as well as became febrile postop day 3.  She also had significant drop in hemoglobin postoperatively and required transfusion.  Her hemoglobin levels responded appropriately after transfusion.  Medicine was consulted and appreciate their help in managing hypoxia, she was started on IV antibiotics and bronchodilators for hospital-acquired pneumonia.  She responded appropriately to these, and no longer needed oxygen.  She will be discharged home today on oral antibiotics.   Recent vital signs:  Vitals:   10/06/23 0641 10/06/23 1034  BP: 100/73 103/61  Pulse: 81 93  Resp: 17 (!) 22  Temp: 98.1 F (36.7 C) 98.4 F (36.9 C)  SpO2: 95% 91%    Recent laboratory studies:  Lab Results  Component Value Date   HGB 7.9 (L) 10/06/2023   HGB 7.7 (L) 10/05/2023   HGB 7.7 (L) 10/04/2023   Lab Results  Component Value Date   WBC 14.8 (H) 10/06/2023   PLT 124 (L) 10/06/2023   Lab Results  Component Value Date   INR 1.15 12/14/2018   Lab Results  Component Value Date   NA 137 10/06/2023   K 3.5 10/06/2023   CL 104 10/06/2023   CO2 26 10/06/2023   BUN 10 10/06/2023   CREATININE 0.58 10/06/2023   GLUCOSE 104 (H) 10/06/2023    Discharge Medications:   Allergies as of 10/06/2023       Reactions   Levaquin [levofloxacin] Anaphylaxis   Ca Phosphate-cholecalciferol Hives   Origanum Oil Hives   Penicillins Swelling        Medication List     STOP taking these medications    naproxen sodium 220 MG  tablet Commonly known as: ALEVE       TAKE these medications    acetaminophen 325 MG tablet Commonly known as: TYLENOL Take 2 tablets (650 mg total) by mouth every 6 (six) hours as needed for mild pain, fever or headache.   ARIPiprazole 20 MG tablet Commonly known as: ABILIFY Take 20 mg by mouth daily.   aspirin EC 81 MG tablet Take 1 tablet (81 mg total) by mouth 2 (two) times daily. For DVT prophylaxis for 30 days after surgery.   cephALEXin 500 MG capsule Commonly known as: KEFLEX Take 1 capsule (500 mg total) by mouth every 8 (eight) hours for 4 days.   cyanocobalamin 1000 MCG tablet Commonly known as: VITAMIN B12 Take 1 tablet (1,000 mcg total) by mouth daily.   ergocalciferol 1.25 MG (50000 UT) capsule Commonly known as: VITAMIN D2 Take 50,000 Units by mouth once a week.   ferrous sulfate 325 (65 FE) MG tablet Commonly known as: FeroSul Take 1 tablet (325 mg total) by mouth daily with breakfast.   folic acid 1 MG tablet Commonly known as: FOLVITE Take 1 tablet (1 mg total) by mouth daily.   ibuprofen 200 MG tablet Commonly known as: ADVIL Take 200 mg by mouth every 6 (six) hours as needed for moderate pain.   methocarbamol 500 MG tablet Commonly known as: ROBAXIN Take 1 tablet (500 mg total) by mouth every 8 (eight) hours as needed for muscle spasms. What changed: when to take this   metroNIDAZOLE 500 MG tablet Commonly known as: FLAGYL Take 1 tablet (500 mg total) by mouth 2 (two) times daily for 4 days.   ondansetron 4 MG tablet Commonly known as: Zofran Take 1 tablet (4 mg total) by mouth every 8 (eight) hours as needed for nausea or vomiting.   oxyCODONE 5 MG immediate release tablet Commonly known as: Roxicodone Take 1-2 tablets (5-10 mg total) by mouth every 4 (four) hours as needed for severe pain (pain score 7-10).   sertraline 100 MG tablet Commonly known as: ZOLOFT Take 100 mg by mouth at bedtime.   Stool Softener/Laxative 50-8.6 MG  tablet Generic drug: senna-docusate Take 2 tablets by mouth daily.   zolpidem 12.5 MG CR tablet Commonly known as: AMBIEN CR Take 12.5 mg by mouth at bedtime as needed for sleep.        Diagnostic Studies: CT Angio Chest Pulmonary Embolism (PE) W or WO Contrast  Result Date: 10/02/2023 CLINICAL DATA:  Status post left shoulder replacement, chest pain EXAM: CT ANGIOGRAPHY CHEST WITH CONTRAST TECHNIQUE:  Multidetector CT imaging of the chest was performed using the standard protocol during bolus administration of intravenous contrast. Multiplanar CT image reconstructions and MIPs were obtained to evaluate the vascular anatomy. RADIATION DOSE REDUCTION: This exam was performed according to the departmental dose-optimization program which includes automated exposure control, adjustment of the mA and/or kV according to patient size and/or use of iterative reconstruction technique. CONTRAST:  OMNIPAQUE IOHEXOL 350 MG/ML SOLN COMPARISON:  CT chest, abdomen and pelvis dated July 06, 2023; chest CTA dated August 08, 2022; chest CT dated July 01, 2019 FINDINGS: Cardiovascular: No evidence of central pulmonary embolus. Evaluation of the segmental and subsegmental pulmonary arteries is limited due to motion artifact and bolus timing. Normal heart size. No pericardial effusion. Normal caliber thoracic aorta with no significant atherosclerotic disease. Mediastinum/Nodes: Esophagus and thyroid are unremarkable. Mildly enlarged mediastinal and bilateral hilar lymph nodes. Reference left hilar lymph node measuring 14 mm in short axis on series 4, image 41. Lungs/Pleura: Central airways are patent. Left-greater-than-right consolidations which are more pronounced in the posterior lungs, worsened when compared with the prior. Upper Abdomen: No acute abnormality. Musculoskeletal: Soft tissue nodule of the right breast with calcification measuring 1.4 cm on series 4, image 75, unchanged when compared with priors  likely benign adenoma. Interval left total shoulder replacement with adjacent soft tissue stranding and locules of air, consistent with expected postsurgical changes. Sclerotic lesion of the inferior sternum and lucent and sclerotic lesion of the manubrium, unchanged when compared with the recent prior, and likely due to avascular necrosis. Review of the MIP images confirms the above findings. IMPRESSION: 1. No evidence of central pulmonary embolus. Evaluation of the segmental and subsegmental pulmonary arteries is limited due to motion artifact and bolus timing. 2. Left-greater-than-right consolidations, increased when compared with prior, likely due to recurrent infection or aspiration. Recommend follow-up chest CT in 3 months to ensure resolution. 3. Postsurgical changes of left total shoulder arthroplasty. 4. Sclerotic lesion of the inferior sternum and lucent and sclerotic lesion of the manubrium, unchanged when compared with the recent prior, and likely due to avascular necrosis. Electronically Signed   By: Allegra Lai M.D.   On: 10/02/2023 12:54   DG Shoulder 1V Left  Result Date: 10/01/2023 CLINICAL DATA:  Left shoulder arthroplasty EXAM: LEFT SHOULDER COMPARISON:  Prior radiographs of the left shoulder 09/30/2023 FINDINGS: Stable surgical changes of left shoulder arthroplasty. No evidence of hardware complication or malalignment. Low inspiratory volumes with pulmonary vascular congestion and probable left basilar atelectasis. IMPRESSION: Stable surgical changes of left shoulder arthroplasty. Electronically Signed   By: Malachy Moan M.D.   On: 10/01/2023 13:22   DG Chest 1 View  Result Date: 10/01/2023 CLINICAL DATA:  Status post left shoulder replacement EXAM: CHEST  1 VIEW COMPARISON:  Left shoulder radiograph obtained yesterday FINDINGS: Low inspiratory volumes with nonspecific patchy bibasilar airspace opacities. Mild pulmonary vascular congestion. Cardiac and mediastinal contours are  unchanged. Surgical changes of left shoulder arthroplasty. More remote surgical changes of prior right shoulder arthroplasty. IMPRESSION: 1. Low inspiratory volumes with bibasilar airspace opacities favored to reflect atelectasis. 2. Pulmonary vascular congestion without overt edema. 3. Surgical changes of bilateral shoulder joint arthroplasty procedures. Electronically Signed   By: Malachy Moan M.D.   On: 10/01/2023 13:18   DG Shoulder Left Port  Result Date: 09/30/2023 CLINICAL DATA:  Status post left shoulder replacement. EXAM: LEFT SHOULDER COMPARISON:  None Available. FINDINGS: Glenohumeral arthroplasty in place. Glenohumeral alignment is difficult to accurately assess on provided views.  No periprosthetic fracture. Recent postsurgical change includes air and edema in the soft tissues. IMPRESSION: Glenohumeral arthroplasty without immediate postoperative complication. Electronically Signed   By: Narda Rutherford M.D.   On: 09/30/2023 18:11    Disposition: Discharge disposition: 01-Home or Self Care          Follow-up Information     Teryl Lucy, MD. Schedule an appointment as soon as possible for a visit in 2 week(s).   Specialty: Orthopedic Surgery Contact information: [address] [place] [address] [phone]                  Signed: Annita Brod 10/06/2023, 2:19 PM

## 2023-10-06 NOTE — Progress Notes (Signed)
Subjective: 6 Days Post-Op s/p Procedure(s): TOTAL SHOULDER ARTHROPLASTY   Patient is alert, oriented, states she's feeling better today. Still struggling with pain, looks like she's been having oxycodone every 6 hours or so. Denies CP, Calf pain. No nausea/vomiting. Continues to have dry cough but improved. Ready to discharge home today, will have support at home later this afternoon.  Objective:  PE: VITALS:   Vitals:   10/05/23 1735 10/05/23 1824 10/05/23 2035 10/06/23 0641  BP:  106/61 107/73 100/73  Pulse:  99 97 81  Resp:  16 18 17   Temp:  98.9 F (37.2 C) 99.1 F (37.3 C) 98.1 F (36.7 C)  TempSrc:   Oral Oral  SpO2: 94% 90% 94% 95%  Weight:      Height:       General: laying in bed, in no acute distress Resp: normal respiratory effort, no cough on exam this morning. On room air MSK: LUE in sling. Dressing CDI. Able to flex, extend, and abduct all fingers of left hand. Distal sensation intact   LABS  Results for orders placed or performed during the hospital encounter of 09/30/23 (from the past 24 hour(s))  Basic metabolic panel     Status: Abnormal   Collection Time: 10/06/23  4:06 AM  Result Value Ref Range   Sodium 137 135 - 145 mmol/L   Potassium 3.5 3.5 - 5.1 mmol/L   Chloride 104 98 - 111 mmol/L   CO2 26 22 - 32 mmol/L   Glucose, Bld 104 (H) 70 - 99 mg/dL   BUN 10 6 - 20 mg/dL   Creatinine, Ser 1.61 0.44 - 1.00 mg/dL   Calcium 8.5 (L) 8.9 - 10.3 mg/dL   GFR, Estimated >09 >60 mL/min   Anion gap 7 5 - 15  CBC with Differential/Platelet     Status: Abnormal   Collection Time: 10/06/23  4:06 AM  Result Value Ref Range   WBC 14.8 (H) 4.0 - 10.5 K/uL   RBC 2.70 (L) 3.87 - 5.11 MIL/uL   Hemoglobin 7.9 (L) 12.0 - 15.0 g/dL   HCT 45.4 (L) 09.8 - 11.9 %   MCV 87.0 80.0 - 100.0 fL   MCH 29.3 26.0 - 34.0 pg   MCHC 33.6 30.0 - 36.0 g/dL   RDW 14.7 (H) 82.9 - 56.2 %   Platelets 124 (L) 150 - 400 K/uL   nRBC 20.8 (H) 0.0 - 0.2 %   Neutrophils Relative %  71 %   Lymphocytes Relative 13 %   Monocytes Relative 8 %   Eosinophils Relative 6 %   Basophils Relative 0 %   Band Neutrophils 2 %   Metamyelocytes Relative 0 %   Myelocytes 0 %   Promyelocytes Relative 0 %   Blasts 0 %   nRBC 41 (H) 0 /100 WBC   Other 0 %   Neutro Abs 10.8 (H) 1.7 - 7.7 K/uL   Lymphs Abs 1.9 0.7 - 4.0 K/uL   Monocytes Absolute 1.2 (H) 0.1 - 1.0 K/uL   Eosinophils Absolute 0.9 (H) 0.0 - 0.5 K/uL   Basophils Absolute 0.0 0.0 - 0.1 K/uL   Abs Immature Granulocytes 0.00 0.00 - 0.07 K/uL   Target Cells PRESENT    Polychromasia PRESENT    Stomatocytes PRESENT     No results found.  Assessment/Plan: Left shoulder AVN  6 Days Post-Op s/p Procedure(s): TOTAL SHOULDER ARTHROPLASTY Weightbearing: NWB LUE, sling at all times. Ice to operative site. Insicional and dressing  care: Reinforce dressings as needed Pain control: continue current regimen Follow - up plan: 2 weeks with Dr. Dion Saucier DVT prophylaxis - has not been on any chemoprophylaxis due to anemia. Continue SCDs, added on aspirin. Will continue to watch. No signs/symptoms of VTE at this time.    Acute blood loss anemia in patient with baseline anemia and states she is sickle cell trait.  - Hbg trending up 7.7 to 7.9 today  - Afebrile, WBC trending down  Possibly discharge home later this afternoon, cleared by medicine yesterday   Contact information:   Janine Ores, PA-C Weekdays 8-5  After hours and holidays please check [email or web address] for group call information for [facility]  Armida Sans 10/06/2023, 8:39 AM

## 2023-10-06 NOTE — Plan of Care (Signed)
  Problem: Education: Goal: Knowledge of General Education information will improve Description Including pain rating scale, medication(s)/side effects and non-pharmacologic comfort measures Outcome: Progressing   

## 2023-10-07 ENCOUNTER — Encounter (HOSPITAL_COMMUNITY): Payer: Self-pay | Admitting: Orthopedic Surgery

## 2023-10-08 LAB — CULTURE, BLOOD (ROUTINE X 2)
Culture: NO GROWTH
Culture: NO GROWTH
Special Requests: ADEQUATE
Special Requests: ADEQUATE

## 2023-10-14 ENCOUNTER — Ambulatory Visit: Payer: Medicaid Other | Attending: Internal Medicine

## 2023-10-14 DIAGNOSIS — R293 Abnormal posture: Secondary | ICD-10-CM | POA: Insufficient documentation

## 2023-10-14 DIAGNOSIS — M25512 Pain in left shoulder: Secondary | ICD-10-CM | POA: Insufficient documentation

## 2023-10-14 DIAGNOSIS — M25612 Stiffness of left shoulder, not elsewhere classified: Secondary | ICD-10-CM | POA: Insufficient documentation

## 2023-10-15 ENCOUNTER — Encounter (HOSPITAL_COMMUNITY): Payer: Self-pay | Admitting: Orthopedic Surgery

## 2023-10-15 ENCOUNTER — Ambulatory Visit: Payer: Medicaid Other | Admitting: Physical Therapy

## 2023-10-21 ENCOUNTER — Encounter: Payer: Medicaid Other | Admitting: Physical Therapy

## 2023-10-22 ENCOUNTER — Ambulatory Visit: Payer: Medicaid Other | Admitting: Physical Therapy

## 2023-10-28 ENCOUNTER — Encounter: Payer: Medicaid Other | Admitting: Physical Therapy

## 2023-10-29 ENCOUNTER — Encounter: Payer: Medicaid Other | Admitting: Physical Therapy

## 2023-10-30 NOTE — Telephone Encounter (Signed)
Telephone call  

## 2023-11-03 NOTE — Therapy (Signed)
OUTPATIENT PHYSICAL THERAPY SHOULDER EVALUATION   Patient Name: Tina Mayo MRN: [id number] DOB:December 13, 1970, 52 y.o., female Today's Date: 11/04/2023  END OF SESSION:  PT End of Session - 11/04/23 0846     Visit Number 1    Number of Visits 17    Date for PT Re-Evaluation 12/30/23    Authorization Type medicaid [employer]    Authorization Time Period per eval appt notes, no auth required 27 VL    PT Start Time 0846    PT Stop Time 0924    PT Time Calculation (min) 38 min    Activity Tolerance Patient tolerated treatment well    Behavior During Therapy [facility] for tasks assessed/performed             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   Bell's palsy    Depression    Sickle cell trait (HCC)    Sleep apnea    no per pt   Past Surgical History:  Procedure Laterality Date   benign breast tumor removed     2006   BREAST BIOPSY Right 2014   benign   BREAST EXCISIONAL BIOPSY Right 2007   benign    CHOLECYSTECTOMY N/A 12/16/2018   Procedure: LAPAROSCOPIC CHOLECYSTECTOMY;  Surgeon: Berna Bue, MD;  Location: [facility];  Service: General;  Laterality: N/A;   right ankle surgery     2010   TOTAL SHOULDER ARTHROPLASTY Right 06/29/2019   Procedure: TOTAL SHOULDER ARTHROPLASTY;  Surgeon: Teryl Lucy, MD;  Location: [facility];  Service: Orthopedics;  Laterality: Right;   TOTAL SHOULDER ARTHROPLASTY Left 09/30/2023   Procedure: TOTAL SHOULDER ARTHROPLASTY;  Surgeon: Teryl Lucy, MD;  Location: [facility];  Service: Orthopedics;  Laterality: Left;   TUBAL LIGATION     Patient Active Problem List   Diagnosis Date Noted   Cough 10/01/2023   S/P shoulder replacement, 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   CAP (community acquired pneumonia) 07/05/2023   GAD (generalized anxiety disorder) 07/05/2023   Chronic iron deficiency anemia 07/05/2023   Heel spur, left 09/07/2021   Stress reaction of left foot  07/27/2021   Fever postop 07/01/2019   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   Constipation due to slow transit 12/18/2018   Constipation    Choledocholithiasis 12/14/2018   RUQ abdominal pain    Trichomoniasis 05/03/2015   Pap smear for cervical cancer screening 05/02/2015   Perimenopausal 05/02/2015   Vaginal discharge 05/02/2015   Bell's palsy 02/28/2015   Insomnia 12/26/2013   Cystic breast 12/26/2013   Tobacco abuse 09/21/2013   Chest pain 08/26/2013   Thrombocytopenia (HCC) 08/26/2013   Cardiomegaly 08/26/2013   Sickle cell trait (HCC) 08/26/2013   Abnormal CT of the chest 08/26/2013   Depression 08/26/2013   Anemia, unspecified 08/26/2013   Fracture of ankle, closed 09/18/2010   Closed fracture of shaft of fibula with tibia 09/12/2010    PCP: Fleet Contras, MD  REFERRING PROVIDER: Teryl Lucy, MD  REFERRING DIAG: S/P L TSA DOS 09/30/23  THERAPY DIAG:  Left shoulder pain, unspecified chronicity  Stiffness of left shoulder, not elsewhere classified  Abnormal posture  Rationale for Evaluation and Treatment: Rehabilitation  ONSET DATE: s/p L TSA 09/30/23  SUBJECTIVE:  SUBJECTIVE STATEMENT: Pt endorses primary complaint of stiffness since surgery, has been using sling full time. Most of her pain is reportedly in elbow. Pt states she is very much looking forward to coming out of sling. Lives alone and is typically independent, is currently trying to take care of household activities one handed (RUE) and is having increased time/difficulty. Pt states she is a [employer] aide and is trying to return to work end of January. Denies fevers/chills, drainage or significant swelling, no N/T in UE. Pt states incision is doing well overall. Sleeping in her bed with pillow support and sling.   Hand dominance: Right  PERTINENT HISTORY: cardiomegaly, bell's palsy, hx humeral AVN, hx ankle fracture, sickle cell trait, insomnia, depression/GAD  PAIN:  Are you having pain: 8/10 Location/description: mostly in R elbow, stiffness/aching Best-worst over past week: 0-11/10 - aggravating factors: sling, cooking, bathing, cold - Easing factors: showers    PRECAUTIONS: Shoulder   WEIGHT BEARING RESTRICTIONS: Yes assumed NWB post op LUE  FALLS:  Has patient fallen in last 6 months? 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  DIAGNOSTIC FINDINGS:  DOS 09/30/23 L TSA  PATIENT SURVEYS:  FOTO 49> 66  COGNITION: Overall cognitive status: Within functional limits for tasks assessed     SENSATION: Some numbness around incision, denies N/T in UE  Hx bell's palsy  POSTURE: Elevated UT BIL, in sling; outside of sling pt maintains elbow flexion but is able to extend elbow near full  UPPER EXTREMITY ROM:  A/PROM Right eval Left eval  Shoulder flexion  P: 70 deg limited pain/muscle guarding  Shoulder abduction  P: 75 deg mild pain   Shoulder internal rotation    Shoulder external rotation    Elbow flexion    Elbow extension    Wrist flexion    Wrist extension     (Blank rows = not tested) (Key: WFL = within functional limits not formally assessed, * = concordant pain, s = stiffness/stretching sensation, NT = not tested)  Comments:    UPPER EXTREMITY MMT:  MMT Right eval Left eval  Shoulder flexion    Shoulder extension    Shoulder abduction    Shoulder extension    Shoulder internal rotation    Shoulder external rotation    Elbow flexion    Elbow  extension    Grip strength    (Blank rows = not tested)  (Key: WFL = within functional limits not formally assessed, * = concordant pain, s = stiffness/stretching sensation, NT = not tested)  Comments: MMT deferred on eval given proximity to surgery  SHOULDER SPECIAL TESTS: Deferred given surgery  JOINT MOBILITY TESTING:  Deferred given surgery  PALPATION:  deferred   TODAY'S TREATMENT:  [facility] Adult PT Treatment:                                                DATE: 11/04/23 Therapeutic Exercise: Passive shoulder flexion table slides x8 LUE cues for form and appropriate ROM Scap retraction (in sling to avoid shoulder ext) x8 education on appropriate performance, avoiding extension HEP handout + education    PATIENT EDUCATION: Education details: Pt education on PT impairments, prognosis, and POC. Informed consent. Rationale for interventions, safe/appropriate HEP performance, post op movement precautions Person educated: Patient Education method: Explanation, Demonstration, Tactile cues, Verbal cues, and Handouts Education comprehension: verbalized understanding, returned demonstration, verbal cues required, tactile cues required, and needs further education    HOME EXERCISE PROGRAM: Access Code: [email or web address] URL: [email or web address] Date: 11/04/2023 Prepared by: Fransisco Hertz  Exercises - Seated Shoulder Flexion Towel Slide at Table Top  - 2-3 x daily - 7 x weekly - 1 sets - 8 reps - Seated Scapular Retraction  - 2-3 x daily - 7 x weekly - 1 sets - 8 reps  ASSESSMENT:  CLINICAL IMPRESSION: Patient is a pleasant 52 y.o. woman who was seen today for physical therapy evaluation and treatment for L TSA DOS 09/30/23. Pt endorses functional limitations as expected post operatively, reports primarily limited by sling use and stiffness. On  exam today pt is fairly limited in PROM although does seem to be limited more so by discomfort/muscle guarding rather than overt stiffness, and does better with self-PROM for HEP. Pt reports reduction in pain/stiffness after PT assisted PROM and HEP. Able to don/doff sling independently for exercises, follows precautions well during session. Education on post op precautions, safety w/ HEP, and monitoring symptoms + modifying accordingly. No adverse events. Recommend skilled PT to address aforementioned deficits with aim of improving functional tolerance and reducing pain with typical activities. Pt departs today's session in no acute distress, all voiced concerns/questions addressed appropriately from PT perspective.      OBJECTIVE IMPAIRMENTS: decreased activity tolerance, decreased endurance, decreased mobility, decreased ROM, decreased strength, hypomobility, impaired sensation, impaired UE functional use, postural dysfunction, and pain.   ACTIVITY LIMITATIONS: carrying, lifting, sleeping, bathing, dressing, reach over head, hygiene/grooming, and caring for others  PARTICIPATION LIMITATIONS: meal prep, cleaning, laundry, driving, community activity, and occupation  PERSONAL FACTORS: Time since onset of injury/illness/exacerbation and 3+ comorbidities: cardiomegaly, bell's palsy, hx humeral AVN, hx ankle fracture, sickle cell trait, insomnia, depression/GAD  are also affecting patient's functional outcome.   REHAB POTENTIAL: Good  CLINICAL DECISION MAKING: Stable/uncomplicated  EVALUATION COMPLEXITY: Low   GOALS: Goals reviewed with patient? Yes  SHORT TERM GOALS: Target date: 12/02/2023    Pt will demonstrate appropriate understanding and performance of initially prescribed HEP in order to facilitate improved independence with management of symptoms.  Baseline: HEP provided on eval Goal status: INITIAL   2. Pt will score greater than or equal to 57 on FOTO in order to demonstrate  improved perception of function due to symptoms.  Baseline: 49  Goal status: INITIAL    LONG TERM GOALS: Target date: 12/30/2023   Pt will score 66 on FOTO in order to demonstrate improved perception of function due to symptoms. Baseline: 49 Goal status: INITIAL  2.  Pt will demonstrate at least 140 degrees of active shoulder elevation in order to demonstrate improved tolerance to functional movement patterns such as reaching  overhead.  Baseline: see ROM chart above Goal status: INITIAL  3.  Pt will demonstrate at least 4+/5 shoulder flex/abd MMT for improved symmetry of UE strength and improved tolerance to functional movements.  Baseline: deferred on eval given proximity to surgery Goal status: INITIAL  4. Pt will reportability to perform upper body dressing with less than 2 point increase in pain on NPS in order to indicate improved tolerance/independence with ADLs.  Baseline: increased pain/difficulty with daily tasks  Goal status: INITIAL   5. Pt will report at least 50% decrease in overall pain levels in past week in order to facilitate improved tolerance to basic ADLs/mobility.   Baseline: 0-11/10  Goal status: INITIAL    PLAN:  PT FREQUENCY: 2x/week  PT DURATION: 8 weeks  PLANNED INTERVENTIONS: 97164- PT Re-evaluation, 97110-Therapeutic exercises, 97530- Therapeutic activity, 97112- Neuromuscular re-education, 97535- Self Care, 16109- Manual therapy, 97014- Electrical stimulation (unattended), Patient/Family education, Balance training, Stair training, Taping, Dry Needling, Joint mobilization, Spinal mobilization, Scar mobilization, Cryotherapy, and Moist heat  PLAN FOR NEXT SESSION: Review/update HEP PRN. Work on PROM initially, working towards [employer] as appropriate/tolerated. Symptom modification strategies as indicated/appropriate. Reinforce post op precautions. Recommend considering initiation of deltoid isometrics in next couple visits pending pt tolerance. Phase 2 Delbert Harness protocol (4-6 weeks): avoid IR/extension, limit scaption/flexion <120 deg, ER <30-45 deg at 30 deg abd   Ashley Murrain PT, DPT 11/04/2023 10:28 AM

## 2023-11-04 ENCOUNTER — Encounter: Payer: Self-pay | Admitting: Physical Therapy

## 2023-11-04 ENCOUNTER — Ambulatory Visit: Payer: Medicaid Other | Admitting: Physical Therapy

## 2023-11-04 DIAGNOSIS — M25612 Stiffness of left shoulder, not elsewhere classified: Secondary | ICD-10-CM

## 2023-11-04 DIAGNOSIS — R293 Abnormal posture: Secondary | ICD-10-CM | POA: Diagnosis present

## 2023-11-04 DIAGNOSIS — M25512 Pain in left shoulder: Secondary | ICD-10-CM

## 2023-11-17 NOTE — Therapy (Signed)
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   Bell's palsy    Depression    Sickle cell trait (HCC)    Sleep apnea    no per pt   Past Surgical History:  Procedure Laterality Date   benign breast tumor removed     2006   BREAST BIOPSY Right 2014   benign   BREAST EXCISIONAL BIOPSY Right 2007   benign    CHOLECYSTECTOMY N/A 12/16/2018   Procedure: LAPAROSCOPIC CHOLECYSTECTOMY;  Surgeon: Berna Bue, MD;  Location: [facility];  Service: General;  Laterality: N/A;   right ankle surgery     2010   TOTAL SHOULDER ARTHROPLASTY Right 06/29/2019   Procedure: TOTAL SHOULDER ARTHROPLASTY;  Surgeon: Teryl Lucy, MD;  Location: [facility];  Service: Orthopedics;  Laterality: Right;   TOTAL SHOULDER ARTHROPLASTY Left 09/30/2023   Procedure: TOTAL SHOULDER ARTHROPLASTY;  Surgeon: Teryl Lucy, MD;  Location: [facility];  Service: Orthopedics;  Laterality: Left;   TUBAL LIGATION     Patient Active Problem List   Diagnosis Date Noted   Cough 10/01/2023   S/P shoulder replacement, 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   CAP (community acquired pneumonia) 07/05/2023   GAD (generalized anxiety disorder) 07/05/2023   Chronic iron deficiency anemia 07/05/2023   Heel spur, left 09/07/2021   Stress reaction of left foot 07/27/2021   Fever postop 07/01/2019   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   Constipation due to slow transit 12/18/2018   Constipation    Choledocholithiasis 12/14/2018   RUQ abdominal pain    Trichomoniasis 05/03/2015   Pap smear for cervical cancer screening 05/02/2015   Perimenopausal 05/02/2015   Vaginal discharge 05/02/2015   Bell's palsy 02/28/2015   Insomnia 12/26/2013   Cystic breast 12/26/2013    Tobacco abuse 09/21/2013   Chest pain 08/26/2013   Thrombocytopenia (HCC) 08/26/2013   Cardiomegaly 08/26/2013   Sickle cell trait (HCC) 08/26/2013   Abnormal CT of the chest 08/26/2013   Depression 08/26/2013   Anemia, unspecified 08/26/2013   Fracture of ankle, closed 09/18/2010   Closed fracture of shaft of fibula with tibia 09/12/2010    PCP: Fleet Contras, MD  REFERRING PROVIDER: Teryl Lucy, MD  REFERRING DIAG: S/P L TSA DOS 09/30/23  THERAPY DIAG:  No diagnosis found.  Rationale for Evaluation and Treatment: Rehabilitation  ONSET DATE: s/p L TSA 09/30/23  SUBJECTIVE:  SUBJECTIVE STATEMENT:   EVAL: Pt endorses primary complaint of stiffness since surgery, has been using sling full time. Most of her pain is reportedly in elbow. Pt states she is very much looking forward to coming out of sling. Lives alone and is typically independent, is currently trying to take care of household activities one handed (RUE) and is having increased time/difficulty. 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 Denies fevers/chills, drainage or significant swelling, no N/T in UE. Pt states incision is doing well overall. Sleeping in her bed with pillow support and sling.  Hand dominance: Right  PERTINENT HISTORY: cardiomegaly, bell's palsy, hx humeral AVN, hx ankle fracture, sickle cell trait, insomnia, depression/GAD  PAIN:  Are you having pain: 8/10 Location/description: mostly in R elbow, stiffness/aching Best-worst over past week: 0-11/10 - aggravating factors: sling, cooking, bathing, cold - Easing factors: showers    PRECAUTIONS: Shoulder   WEIGHT BEARING RESTRICTIONS: Yes assumed NWB post op LUE  FALLS:  Has patient fallen in last 6 months? 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  DIAGNOSTIC FINDINGS:  DOS 09/30/23 L TSA  PATIENT SURVEYS:  FOTO 49> 66  COGNITION: Overall cognitive status: Within functional limits for tasks assessed     SENSATION: Some numbness around incision, denies N/T in UE  Hx bell's palsy  POSTURE: Elevated UT BIL, in sling; outside of sling pt maintains elbow flexion but is able to extend elbow near full  UPPER EXTREMITY ROM:  A/PROM Right eval Left eval  Shoulder flexion  P: 70 deg limited pain/muscle guarding  Shoulder abduction  P: 75 deg mild pain   Shoulder internal rotation    Shoulder external rotation    Elbow flexion    Elbow extension    Wrist flexion    Wrist extension     (Blank rows = not tested) (Key: WFL = within functional limits not formally assessed, * = concordant pain, s = stiffness/stretching sensation, NT = not tested)  Comments:    UPPER EXTREMITY MMT:  MMT Right eval Left eval  Shoulder flexion    Shoulder extension    Shoulder abduction    Shoulder extension    Shoulder internal rotation    Shoulder external rotation    Elbow flexion    Elbow extension    Grip strength    (Blank rows = not tested)  (Key: WFL = within functional limits not formally assessed, * = concordant pain, s = stiffness/stretching sensation, NT = not tested)  Comments: MMT deferred on eval given proximity to surgery  SHOULDER SPECIAL TESTS: Deferred given surgery  JOINT MOBILITY TESTING:  Deferred given surgery  PALPATION:  deferred   TODAY'S TREATMENT:   [facility] Adult PT Treatment:                                                DATE: 11/18/23 Therapeutic  Exercise: *** Manual Therapy: *** Neuromuscular re-ed: *** Therapeutic Activity: *** Modalities: *** Self Care: ***  [facility] Adult PT Treatment:                                                DATE: 11/04/23 Therapeutic Exercise: Passive shoulder flexion table slides x8 LUE cues for form and appropriate ROM Scap retraction (in sling to avoid shoulder ext) x8 education on appropriate performance, avoiding extension HEP handout + education    PATIENT EDUCATION: Education details: Pt education on PT impairments, prognosis, and POC. Informed consent. Rationale for interventions, safe/appropriate HEP performance, post op movement precautions Person educated: Patient Education method: Explanation, Demonstration, Tactile cues, Verbal cues, and Handouts Education comprehension: verbalized understanding, returned demonstration, verbal cues required, tactile cues required, and needs further education    HOME EXERCISE PROGRAM: Access Code: [email or web address] URL: [email or web address] Date: 11/04/2023 Prepared by: Fransisco Hertz  Exercises - Seated Shoulder Flexion Towel Slide at Table Top  - 2-3 x daily - 7 x weekly - 1 sets - 8 reps - Seated Scapular Retraction  - 2-3 x daily - 7 x weekly - 1 sets - 8 reps  ASSESSMENT:  CLINICAL IMPRESSION:   EVAL: Patient is a pleasant 52 y.o. woman who was seen today for physical therapy evaluation and treatment for L TSA DOS 09/30/23. Pt endorses functional limitations as expected post operatively, reports primarily limited by sling use and stiffness. On exam today pt is fairly limited in PROM although does seem to be limited more so by discomfort/muscle guarding rather than overt stiffness, and does better with self-PROM for HEP. Pt reports reduction in pain/stiffness after PT assisted PROM and HEP. Able to don/doff  sling independently for exercises, follows precautions well during session. Education on post op precautions, safety w/ HEP, and monitoring symptoms + modifying accordingly. No adverse events. Recommend skilled PT to address aforementioned deficits with aim of improving functional tolerance and reducing pain with typical activities. Pt departs today's session in no acute distress, all voiced concerns/questions addressed appropriately from PT perspective.      OBJECTIVE IMPAIRMENTS: decreased activity tolerance, decreased endurance, decreased mobility, decreased ROM, decreased strength, hypomobility, impaired sensation, impaired UE functional use, postural dysfunction, and pain.   ACTIVITY LIMITATIONS: carrying, lifting, sleeping, bathing, dressing, reach over head, hygiene/grooming, and caring for others  PARTICIPATION LIMITATIONS: meal prep, cleaning, laundry, driving, community activity, and occupation  PERSONAL FACTORS: Time since onset of injury/illness/exacerbation and 3+ comorbidities: cardiomegaly, bell's palsy, hx humeral AVN, hx ankle fracture, sickle cell trait, insomnia, depression/GAD  are also affecting patient's functional outcome.   REHAB POTENTIAL: Good  CLINICAL DECISION MAKING: Stable/uncomplicated  EVALUATION COMPLEXITY: Low   GOALS: Goals reviewed with patient? Yes  SHORT TERM GOALS: Target date: 12/02/2023    Pt will demonstrate appropriate understanding and performance of initially prescribed HEP in order to facilitate improved independence with management of symptoms.  Baseline: HEP provided on eval Goal status: INITIAL   2. Pt will score greater than or equal to 57 on FOTO in order to demonstrate improved perception of function due to symptoms.  Baseline: 49  Goal status: INITIAL    LONG TERM GOALS: Target date: 12/30/2023   Pt will score 66 on FOTO in order to demonstrate improved perception of function due to symptoms. Baseline: 49 Goal status:  INITIAL  2.  Pt will demonstrate at least 140 degrees of active shoulder elevation in order to demonstrate improved tolerance to functional movement patterns  such as reaching overhead.  Baseline: see ROM chart above Goal status: INITIAL  3.  Pt will demonstrate at least 4+/5 shoulder flex/abd MMT for improved symmetry of UE strength and improved tolerance to functional movements.  Baseline: deferred on eval given proximity to surgery Goal status: INITIAL  4. Pt will reportability to perform upper body dressing with less than 2 point increase in pain on NPS in order to indicate improved tolerance/independence with ADLs.  Baseline: increased pain/difficulty with daily tasks  Goal status: INITIAL   5. Pt will report at least 50% decrease in overall pain levels in past week in order to facilitate improved tolerance to basic ADLs/mobility.   Baseline: 0-11/10  Goal status: INITIAL    PLAN:  PT FREQUENCY: 2x/week  PT DURATION: 8 weeks  PLANNED INTERVENTIONS: 97164- PT Re-evaluation, 97110-Therapeutic exercises, 97530- Therapeutic activity, 97112- Neuromuscular re-education, 97535- Self Care, 45409- Manual therapy, 97014- Electrical stimulation (unattended), Patient/Family education, Balance training, Stair training, Taping, Dry Needling, Joint mobilization, Spinal mobilization, Scar mobilization, Cryotherapy, and Moist heat  PLAN FOR NEXT SESSION: Review/update HEP PRN. Work on PROM initially, working towards [employer] as appropriate/tolerated. Symptom modification strategies as indicated/appropriate. Reinforce post op precautions. Recommend considering initiation of deltoid isometrics in next couple visits pending pt tolerance. Phase 2 Delbert Harness protocol (4-6 weeks): avoid IR/extension, limit scaption/flexion <120 deg, ER <30-45 deg at 30 deg abd   Ashley Murrain PT, DPT 11/17/2023 8:48 PM

## 2023-11-18 ENCOUNTER — Ambulatory Visit: Payer: Medicaid Other | Attending: Internal Medicine

## 2023-11-18 DIAGNOSIS — M25512 Pain in left shoulder: Secondary | ICD-10-CM | POA: Insufficient documentation

## 2023-11-18 DIAGNOSIS — M25612 Stiffness of left shoulder, not elsewhere classified: Secondary | ICD-10-CM | POA: Insufficient documentation

## 2023-11-18 DIAGNOSIS — R293 Abnormal posture: Secondary | ICD-10-CM | POA: Insufficient documentation

## 2023-11-21 ENCOUNTER — Encounter: Payer: Self-pay | Admitting: Physical Therapy

## 2023-11-21 ENCOUNTER — Ambulatory Visit: Payer: Medicaid Other | Admitting: Physical Therapy

## 2023-11-21 DIAGNOSIS — R293 Abnormal posture: Secondary | ICD-10-CM

## 2023-11-21 DIAGNOSIS — M25612 Stiffness of left shoulder, not elsewhere classified: Secondary | ICD-10-CM

## 2023-11-21 DIAGNOSIS — M25512 Pain in left shoulder: Secondary | ICD-10-CM

## 2023-11-21 NOTE — Therapy (Signed)
OUTPATIENT PHYSICAL THERAPY SHOULDER TREATMENT   Patient Name: Tina Mayo MRN: [id number] DOB:09-Mar-1971, 52 y.o., female Today's Date: 11/21/2023  END OF SESSION:  PT End of Session - 11/21/23 0830     Visit Number 3    Number of Visits 17    Date for PT Re-Evaluation 12/30/23    Authorization Type medicaid [employer]    Authorization Time Period per eval appt notes, no auth required 27 VL    PT Start Time 0830    PT Stop Time 0913    PT Time Calculation (min) 43 min    Activity Tolerance Patient tolerated treatment well    Behavior During Therapy WFL for tasks assessed/performed              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   Bell's palsy    Depression    Sickle cell trait (HCC)    Sleep apnea    no per pt   Past Surgical History:  Procedure Laterality Date   benign breast tumor removed     2006   BREAST BIOPSY Right 2014   benign   BREAST EXCISIONAL BIOPSY Right 2007   benign    CHOLECYSTECTOMY N/A 12/16/2018   Procedure: LAPAROSCOPIC CHOLECYSTECTOMY;  Surgeon: Berna Bue, MD;  Location: [facility] OR;  Service: General;  Laterality: N/A;   right ankle surgery     2010   TOTAL SHOULDER ARTHROPLASTY Right 06/29/2019   Procedure: TOTAL SHOULDER ARTHROPLASTY;  Surgeon: Teryl Lucy, MD;  Location: [facility];  Service: Orthopedics;  Laterality: Right;   TOTAL SHOULDER ARTHROPLASTY Left 09/30/2023   Procedure: TOTAL SHOULDER ARTHROPLASTY;  Surgeon: Teryl Lucy, MD;  Location: [facility];  Service: Orthopedics;  Laterality: Left;   TUBAL LIGATION     Patient Active Problem List   Diagnosis Date Noted   Cough 10/01/2023   S/P shoulder replacement, 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   CAP (community acquired pneumonia) 07/05/2023   GAD (generalized anxiety disorder) 07/05/2023   Chronic iron deficiency anemia 07/05/2023   Heel spur, left 09/07/2021   Stress reaction of left foot  07/27/2021   Fever postop 07/01/2019   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   Constipation due to slow transit 12/18/2018   Constipation    Choledocholithiasis 12/14/2018   RUQ abdominal pain    Trichomoniasis 05/03/2015   Pap smear for cervical cancer screening 05/02/2015   Perimenopausal 05/02/2015   Vaginal discharge 05/02/2015   Bell's palsy 02/28/2015   Insomnia 12/26/2013   Cystic breast 12/26/2013   Tobacco abuse 09/21/2013   Chest pain 08/26/2013   Thrombocytopenia (HCC) 08/26/2013   Cardiomegaly 08/26/2013   Sickle cell trait (HCC) 08/26/2013   Abnormal CT of the chest 08/26/2013   Depression 08/26/2013   Anemia, unspecified 08/26/2013   Fracture of ankle, closed 09/18/2010   Closed fracture of shaft of fibula with tibia 09/12/2010    PCP: Fleet Contras, MD  REFERRING PROVIDER: Teryl Lucy, MD  REFERRING DIAG: S/P L TSA DOS 09/30/23  THERAPY DIAG:  Left shoulder pain, unspecified chronicity  Stiffness of left shoulder, not elsewhere classified  Abnormal posture  Rationale for Evaluation and Treatment: Rehabilitation  ONSET DATE: s/p L TSA 09/30/23  SUBJECTIVE:  SUBJECTIVE STATEMENT: Pt reports that things have been going "ok".  She reports that her shoulder pain is about a 7/10.  EVAL: Pt endorses primary complaint of stiffness since surgery, has been using sling full time. Most of her pain is reportedly in elbow. Pt states she is very much looking forward to coming out of sling. Lives alone and is typically independent, is currently trying to take care of household activities one handed (RUE) and is having increased time/difficulty. 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 Denies fevers/chills, drainage or significant swelling,  no N/T in UE. Pt states incision is doing well overall. Sleeping in her bed with pillow support and sling.  Hand dominance: Right  PERTINENT HISTORY: cardiomegaly, bell's palsy, hx humeral AVN, hx ankle fracture, sickle cell trait, insomnia, depression/GAD  PAIN:  Are you having pain: 6/10 with pain medication Location/description: mostly in R elbow, stiffness/aching Best-worst over past week: 0-11/10 - aggravating factors: sling, cooking, bathing, cold - Easing factors: showers    PRECAUTIONS: Shoulder  OP Date: 09/30/2023  2 weeks 10/14/2023  4 weeks 10/28/2023  6 weeks 11/11/2023  8 weeks 11/25/2023  10 weeks 12/09/2023  12 weeks 12/23/2023     WEIGHT BEARING RESTRICTIONS: Yes assumed NWB post op LUE  FALLS:  Has patient fallen in last 6 months? 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  DIAGNOSTIC FINDINGS:  DOS 09/30/23 L TSA  PATIENT SURVEYS:  FOTO 49> 66  COGNITION: Overall cognitive status: Within functional limits for tasks assessed     SENSATION: Some numbness around incision, denies N/T in UE  Hx bell's palsy  POSTURE: Elevated UT BIL, in sling; outside of sling pt maintains elbow flexion but is able to extend elbow near full  UPPER EXTREMITY ROM:  A/PROM Right eval Left eval Lt 11/18/23  Shoulder flexion  P: 70 deg limited pain/muscle guarding P 90  Shoulder abduction  P: 75 deg mild pain  P 80  Shoulder internal rotation     Shoulder external rotation   P 30  Elbow flexion     Elbow extension     Wrist flexion     Wrist extension      (Blank rows = not tested) (Key: WFL = within functional limits not formally assessed, * = concordant  pain, s = stiffness/stretching sensation, NT = not tested)  Comments:    UPPER EXTREMITY MMT:  MMT Right eval Left eval  Shoulder flexion    Shoulder extension    Shoulder abduction    Shoulder extension    Shoulder internal rotation    Shoulder external rotation    Elbow flexion    Elbow extension    Grip strength    (Blank rows = not tested)  (Key: WFL = within functional limits not formally assessed, * = concordant pain, s = stiffness/stretching sensation, NT = not tested)  Comments: MMT deferred on eval given proximity to surgery  SHOULDER SPECIAL TESTS: Deferred given surgery  JOINT MOBILITY TESTING:  Deferred given surgery  PALPATION:  deferred   TODAY'S TREATMENT:    [facility] Adult PT Treatment  11/21/2023:  Therapeutic Exercise:  Pulley's 2'  ea flexion and scaption Deltoid isometrics flexion, abd, ext (only to neutral) Supine flexion AAROM with dowel Supine chest press with dowel  Manual Therapy: PROM to the L shoulder for flexion, abduction, and ER    [facility] Adult PT Treatment:                                                DATE: 11/18/23 - 7 weeks s/p surgery Therapeutic Exercise: Seated AROM for L elbow flex/ext, wrist circumduction, hand opening/closing Supine AROM shoulder flexion with wand for flex, abd, ER x10 each Seated Shoulder Flexion Towel Slide at Table Top  10x Seated shoulder scaption towel slide at table top 10x Seated Scapular Retraction  2x10 Updated HEP Manual Therapy: PROM to the L shoulder for flexion, abduction, and ER                                                                                                                                        [facility] Adult PT Treatment:                                                DATE: 11/04/23 Therapeutic Exercise: Passive shoulder flexion table slides x8 LUE cues for form and appropriate ROM Scap retraction (in sling to avoid shoulder ext) x8 education on appropriate performance, avoiding  extension HEP handout + education    PATIENT EDUCATION: Education details: Pt education on PT impairments, prognosis, and POC. Informed consent. Rationale for interventions, safe/appropriate HEP performance, post op movement precautions Person educated: Patient Education method: Explanation, Demonstration, Tactile cues, Verbal cues, and Handouts Education comprehension: verbalized understanding, returned demonstration, verbal cues required, tactile cues required, and needs further education    HOME EXERCISE PROGRAM: Access Code: [email or web address] URL: [email or web address] Date: 11/21/2023 Prepared by: Alphonzo Severance  Exercises - Seated Shoulder Flexion Towel Slide at Table Top  - 2 x daily - 7 x weekly - 1 sets - 10 reps - Seated Scapular Retraction  - 2 x daily - 7 x weekly - 1 sets - 10 reps - Seated Shoulder Scaption Slide at Table Top with Forearm in Neutral  - 2 x daily - 7 x weekly - 1 sets - 10 reps - Supine Shoulder Flexion Extension AAROM with Dowel  - 2 x daily - 7 x weekly - 1 sets - 10 reps - Supine Shoulder External Rotation with Dowel  - 2 x daily - 7 x weekly - 1 sets - 10 reps - Supine Shoulder Abduction AAROM with Dowel  - 2 x daily - 7 x weekly - 1 sets - 10 reps - Isometric Shoulder Flexion  - 1  x daily - 7 x weekly - 1 sets - 10 reps - 5-10 seconds hold - Isometric Shoulder Extension at Wall  - 1 x daily - 7 x weekly - 1 sets - 10 reps - 5-10 seconds hold  ASSESSMENT:  CLINICAL IMPRESSION: Quinnie tolerated session well with no adverse reaction.  ER fairly easily to 30 degrees today.  Pt is still quite limited in flexion/scaption ROM and is limited to about 90 degrees passively.  She is able to achieve PROM=AROM in supine with minimal pain.  Introduced deltoid isometrics with instructions to avoid shoulder ext past neutral.  HEP updated accordingly.  EVAL: Patient is a pleasant 52 y.o. woman who was seen today for physical therapy evaluation and treatment  for L TSA DOS 09/30/23. Pt endorses functional limitations as expected post operatively, reports primarily limited by sling use and stiffness. On exam today pt is fairly limited in PROM although does seem to be limited more so by discomfort/muscle guarding rather than overt stiffness, and does better with self-PROM for HEP. Pt reports reduction in pain/stiffness after PT assisted PROM and HEP. Able to don/doff sling independently for exercises, follows precautions well during session. Education on post op precautions, safety w/ HEP, and monitoring symptoms + modifying accordingly. No adverse events. Recommend skilled PT to address aforementioned deficits with aim of improving functional tolerance and reducing pain with typical activities. Pt departs today's session in no acute distress, all voiced concerns/questions addressed appropriately from PT perspective.      OBJECTIVE IMPAIRMENTS: decreased activity tolerance, decreased endurance, decreased mobility, decreased ROM, decreased strength, hypomobility, impaired sensation, impaired UE functional use, postural dysfunction, and pain.   ACTIVITY LIMITATIONS: carrying, lifting, sleeping, bathing, dressing, reach over head, hygiene/grooming, and caring for others  PARTICIPATION LIMITATIONS: meal prep, cleaning, laundry, driving, community activity, and occupation  PERSONAL FACTORS: Time since onset of injury/illness/exacerbation and 3+ comorbidities: cardiomegaly, bell's palsy, hx humeral AVN, hx ankle fracture, sickle cell trait, insomnia, depression/GAD  are also affecting patient's functional outcome.   REHAB POTENTIAL: Good  CLINICAL DECISION MAKING: Stable/uncomplicated  EVALUATION COMPLEXITY: Low   GOALS: Goals reviewed with patient? Yes  SHORT TERM GOALS: Target date: 12/02/2023    Pt will demonstrate appropriate understanding and performance of initially prescribed HEP in order to facilitate improved independence with management of  symptoms.  Baseline: HEP provided on eval Goal status: ONGOING   2. Pt will score greater than or equal to 57 on FOTO in order to demonstrate improved perception of function due to symptoms.  Baseline: 49  Goal status: INITIAL    LONG TERM GOALS: Target date: 12/30/2023   Pt will score 66 on FOTO in order to demonstrate improved perception of function due to symptoms. Baseline: 49 Goal status: INITIAL  2.  Pt will demonstrate at least 140 degrees of active shoulder elevation in order to demonstrate improved tolerance to functional movement patterns such as reaching overhead.  Baseline: see ROM chart above Goal status: INITIAL  3.  Pt will demonstrate at least 4+/5 shoulder flex/abd MMT for improved symmetry of UE strength and improved tolerance to functional movements.  Baseline: deferred on eval given proximity to surgery Goal status: INITIAL  4. Pt will reportability to perform upper body dressing with less than 2 point increase in pain on NPS in order to indicate improved tolerance/independence with ADLs.  Baseline: increased pain/difficulty with daily tasks  Goal status: INITIAL   5. Pt will report at least 50% decrease in overall pain levels in past  week in order to facilitate improved tolerance to basic ADLs/mobility.   Baseline: 0-11/10  Goal status: INITIAL    PLAN:  PT FREQUENCY: 2x/week  PT DURATION: 8 weeks  PLANNED INTERVENTIONS: 97164- PT Re-evaluation, 97110-Therapeutic exercises, 97530- Therapeutic activity, 97112- Neuromuscular re-education, 97535- Self Care, 88416- Manual therapy, 97014- Electrical stimulation (unattended), Patient/Family education, Balance training, Stair training, Taping, Dry Needling, Joint mobilization, Spinal mobilization, Scar mobilization, Cryotherapy, and Moist heat  PLAN FOR NEXT SESSION: Review/update HEP PRN. Work on PROM initially, working towards [employer] as appropriate/tolerated. Symptom modification strategies as  indicated/appropriate. Reinforce post op precautions. Recommend considering initiation of deltoid isometrics in next couple visits pending pt tolerance. Phase 2 Delbert Harness protocol (4-6 weeks): avoid IR/extension, limit scaption/flexion <120 deg, ER <30-45 deg at 30 deg abd. Will gradually progress pt into phase 3 protocol as tolerated.  Kimberlee Nearing Kampbell Holaway PT 11/21/23 9:35 AM

## 2023-11-25 ENCOUNTER — Ambulatory Visit: Payer: Medicaid Other

## 2023-11-26 ENCOUNTER — Telehealth: Payer: Self-pay

## 2023-11-26 NOTE — Therapy (Addendum)
OUTPATIENT PHYSICAL THERAPY SHOULDER TREATMENT/DC   Patient Name: Tina Mayo MRN: [id number] DOB:12/07/1971, 52 y.o., female Today's Date: 11/27/2023  END OF SESSION:  PT End of Session - 11/27/23 0938     Visit Number 4    Number of Visits 17    Date for PT Re-Evaluation 12/30/23    Authorization Type medicaid [employer]    Authorization Time Period per eval appt notes, no auth required 27 VL    PT Start Time 0933    PT Stop Time 1024    PT Time Calculation (min) 51 min    Activity Tolerance Patient tolerated treatment well    Behavior During Therapy WFL for tasks assessed/performed               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   Bell's palsy    Depression    Sickle cell trait (HCC)    Sleep apnea    no per pt   Past Surgical History:  Procedure Laterality Date   benign breast tumor removed     2006   BREAST BIOPSY Right 2014   benign   BREAST EXCISIONAL BIOPSY Right 2007   benign    CHOLECYSTECTOMY N/A 12/16/2018   Procedure: LAPAROSCOPIC CHOLECYSTECTOMY;  Surgeon: Berna Bue, MD;  Location: [facility];  Service: General;  Laterality: N/A;   right ankle surgery     2010   TOTAL SHOULDER ARTHROPLASTY Right 06/29/2019   Procedure: TOTAL SHOULDER ARTHROPLASTY;  Surgeon: Teryl Lucy, MD;  Location: [facility];  Service: Orthopedics;  Laterality: Right;   TOTAL SHOULDER ARTHROPLASTY Left 09/30/2023   Procedure: TOTAL SHOULDER ARTHROPLASTY;  Surgeon: Teryl Lucy, MD;  Location: [facility];  Service: Orthopedics;  Laterality: Left;   TUBAL LIGATION     Patient Active Problem List   Diagnosis Date Noted   Cough 10/01/2023   S/P shoulder replacement, 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   CAP (community acquired pneumonia) 07/05/2023   GAD (generalized anxiety disorder) 07/05/2023   Chronic iron deficiency anemia 07/05/2023   Heel spur, left 09/07/2021   Stress reaction of left  foot 07/27/2021   Fever postop 07/01/2019   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   Constipation due to slow transit 12/18/2018   Constipation    Choledocholithiasis 12/14/2018   RUQ abdominal pain    Trichomoniasis 05/03/2015   Pap smear for cervical cancer screening 05/02/2015   Perimenopausal 05/02/2015   Vaginal discharge 05/02/2015   Bell's palsy 02/28/2015   Insomnia 12/26/2013   Cystic breast 12/26/2013   Tobacco abuse 09/21/2013   Chest pain 08/26/2013   Thrombocytopenia (HCC) 08/26/2013   Cardiomegaly 08/26/2013   Sickle cell trait (HCC) 08/26/2013   Abnormal CT of the chest 08/26/2013   Depression 08/26/2013   Anemia, unspecified 08/26/2013   Fracture of ankle, closed 09/18/2010   Closed fracture of shaft of fibula with tibia 09/12/2010    PCP: Fleet Contras, MD  REFERRING PROVIDER: Teryl Lucy, MD  REFERRING DIAG: S/P L TSA DOS 09/30/23  THERAPY DIAG:  Left shoulder pain, unspecified chronicity  Stiffness of left shoulder, not elsewhere classified  Abnormal posture  Rationale for Evaluation and Treatment: Rehabilitation  ONSET DATE: s/p L TSA 09/30/23  SUBJECTIVE:  SUBJECTIVE STATEMENT: Pt reports her L shoulder seems to be more swollen and stiff than her R shoulder when it was done  EVAL: Pt endorses primary complaint of stiffness since surgery, has been using sling full time. Most of her pain is reportedly in elbow. Pt states she is very much looking forward to coming out of sling. Lives alone and is typically independent, is currently trying to take care of household activities one handed (RUE) and is having increased time/difficulty. 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 Denies fevers/chills, drainage or significant  swelling, no N/T in UE. Pt states incision is doing well overall. Sleeping in her bed with pillow support and sling.  Hand dominance: Right  PERTINENT HISTORY: cardiomegaly, bell's palsy, hx humeral AVN, hx ankle fracture, sickle cell trait, insomnia, depression/GAD  PAIN:  Are you having pain: 10/10 with pain medication Location/description: mostly in R elbow, stiffness/aching Best-worst over past week: 0-11/10 - aggravating factors: sling, cooking, bathing, cold - Easing factors: showers    PRECAUTIONS: Shoulder  OP Date: 09/30/2023  2 weeks 10/14/2023  4 weeks 10/28/2023  6 weeks 11/11/2023  8 weeks 11/25/2023  10 weeks 12/09/2023  12 weeks 12/23/2023     WEIGHT BEARING RESTRICTIONS: Yes assumed NWB post op LUE  FALLS:  Has patient fallen in last 6 months? 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  DIAGNOSTIC FINDINGS:  DOS 09/30/23 L TSA  PATIENT SURVEYS:  FOTO 49> 66  COGNITION: Overall cognitive status: Within functional limits for tasks assessed     SENSATION: Some numbness around incision, denies N/T in UE  Hx bell's palsy  POSTURE: Elevated UT BIL, in sling; outside of sling pt maintains elbow flexion but is able to extend elbow near full  UPPER EXTREMITY ROM:  A/PROM Right eval Left eval Lt 11/18/23 Lt 11/27/23  Shoulder flexion  P: 70 deg limited pain/muscle guarding P 90 AA 110 Tabletop A 100  Shoulder abduction  P: 75 deg mild pain  P 80 P 80  Shoulder internal rotation      Shoulder external rotation   P 30 P 38  Elbow flexion      Elbow extension      Wrist flexion      Wrist extension       (Blank rows = not tested) (Key: WFL =  within functional limits not formally assessed, * = concordant pain, s = stiffness/stretching sensation, NT = not tested)  Comments:    UPPER EXTREMITY MMT:  MMT Right eval Left eval  Shoulder flexion    Shoulder extension    Shoulder abduction    Shoulder extension    Shoulder internal rotation    Shoulder external rotation    Elbow flexion    Elbow extension    Grip strength    (Blank rows = not tested)  (Key: WFL = within functional limits not formally assessed, * = concordant pain, s = stiffness/stretching sensation, NT = not tested)  Comments: MMT deferred on eval given proximity to surgery  SHOULDER SPECIAL TESTS: Deferred given surgery  JOINT MOBILITY TESTING:  Deferred given surgery  PALPATION:  deferred  TODAY'S TREATMENT:   [facility] Adult PT Treatment:                                                DATE: 11/27/23 Therapeutic Exercise: Pulley's 2' ea flexion and scaption Table top flexion slides 2x10 Supine AA shoulder flexion and scaption x10 c wand Supine flexion AROM x10 S/L scaption AROM x10 S/L ER in scaption Supine chest press with dowel Manual Therapy: PROM to the L shoulder for flexion, abduction, and ER Modalities:  Cold pack to the l shoulder for 10 mins  [facility] Adult PT Treatment  11/21/2023: Therapeutic Exercise:  Pulley's 2' ea flexion and scaption Deltoid isometrics flexion, abd, ext (only to neutral) Supine flexion AAROM with dowel Supine chest press with dowel  Manual Therapy: PROM to the L shoulder for flexion, abduction, and ER    [facility] Adult PT Treatment:                                                DATE: 11/18/23 - 7 weeks s/p surgery Therapeutic Exercise: Seated AROM for L elbow flex/ext, wrist circumduction, hand opening/closing Supine AROM shoulder flexion with wand for flex, abd, ER x10 each Seated Shoulder Flexion Towel Slide at Table Top  10x Seated shoulder scaption towel slide at table top 10x Seated Scapular Retraction   2x10 Updated HEP Manual Therapy: PROM to the L shoulder for flexion, abduction, and ER                                                                                                                                        [facility] Adult PT Treatment:                                                DATE: 11/04/23 Therapeutic Exercise: Passive shoulder flexion table slides x8 LUE cues for form and appropriate ROM Scap retraction (in sling to avoid shoulder ext) x8 education on appropriate performance, avoiding extension HEP handout + education    PATIENT EDUCATION: Education details: Pt education on PT impairments, prognosis, and POC. Informed consent. Rationale for interventions, safe/appropriate HEP performance, post op movement precautions Person educated: Patient Education method: Explanation, Demonstration, Tactile cues, Verbal cues, and Handouts Education comprehension: verbalized understanding, returned demonstration, verbal cues required, tactile cues required, and needs further education    HOME EXERCISE PROGRAM: Access Code: [email or web address] URL: [email or web address] Date: 11/21/2023 Prepared by: Alphonzo Severance  Exercises - Seated Shoulder Flexion Towel Slide at Table Top  - 2 x  daily - 7 x weekly - 1 sets - 10 reps - Seated Scapular Retraction  - 2 x daily - 7 x weekly - 1 sets - 10 reps - Seated Shoulder Scaption Slide at Table Top with Forearm in Neutral  - 2 x daily - 7 x weekly - 1 sets - 10 reps - Supine Shoulder Flexion Extension AAROM with Dowel  - 2 x daily - 7 x weekly - 1 sets - 10 reps - Supine Shoulder External Rotation with Dowel  - 2 x daily - 7 x weekly - 1 sets - 10 reps - Supine Shoulder Abduction AAROM with Dowel  - 2 x daily - 7 x weekly - 1 sets - 10 reps - Isometric Shoulder Flexion  - 1 x daily - 7 x weekly - 1 sets - 10 reps - 5-10 seconds hold - Isometric Shoulder Extension at Wall  - 1 x daily - 7 x weekly - 1 sets - 10 reps - 5-10 seconds  hold  ASSESSMENT:  CLINICAL IMPRESSION: PT was completed for L shoulder ROM and gentle strengthening. Pt demonstrates improved L shoulder PROM for flexion and ER, and AROM for the L shoulder flexion in supine is close to her passive range. Pt reported her L shoulder felt better after completing the therex, but a cold pack was also provided at the end of session to assist with symptom management. Pt tolerated PT today without adverse effects. Pt will continue to benefit from skilled PT to address impairments for improved function   EVAL: Patient is a pleasant 52 y.o. woman who was seen today for physical therapy evaluation and treatment for L TSA DOS 09/30/23. Pt endorses functional limitations as expected post operatively, reports primarily limited by sling use and stiffness. On exam today pt is fairly limited in PROM although does seem to be limited more so by discomfort/muscle guarding rather than overt stiffness, and does better with self-PROM for HEP. Pt reports reduction in pain/stiffness after PT assisted PROM and HEP. Able to don/doff sling independently for exercises, follows precautions well during session. Education on post op precautions, safety w/ HEP, and monitoring symptoms + modifying accordingly. No adverse events. Recommend skilled PT to address aforementioned deficits with aim of improving functional tolerance and reducing pain with typical activities. Pt departs today's session in no acute distress, all voiced concerns/questions addressed appropriately from PT perspective.      OBJECTIVE IMPAIRMENTS: decreased activity tolerance, decreased endurance, decreased mobility, decreased ROM, decreased strength, hypomobility, impaired sensation, impaired UE functional use, postural dysfunction, and pain.   ACTIVITY LIMITATIONS: carrying, lifting, sleeping, bathing, dressing, reach over head, hygiene/grooming, and caring for others  PARTICIPATION LIMITATIONS: meal prep, cleaning, laundry,  driving, community activity, and occupation  PERSONAL FACTORS: Time since onset of injury/illness/exacerbation and 3+ comorbidities: cardiomegaly, bell's palsy, hx humeral AVN, hx ankle fracture, sickle cell trait, insomnia, depression/GAD  are also affecting patient's functional outcome.   REHAB POTENTIAL: Good  CLINICAL DECISION MAKING: Stable/uncomplicated  EVALUATION COMPLEXITY: Low   GOALS: Goals reviewed with patient? Yes  SHORT TERM GOALS: Target date: 12/02/2023    Pt will demonstrate appropriate understanding and performance of initially prescribed HEP in order to facilitate improved independence with management of symptoms.  Baseline: HEP provided on eval Goal status: ONGOING   2. Pt will score greater than or equal to 57 on FOTO in order to demonstrate improved perception of function due to symptoms.  Baseline: 49  Goal status: INITIAL    LONG TERM GOALS:  Target date: 12/30/2023   Pt will score 66 on FOTO in order to demonstrate improved perception of function due to symptoms. Baseline: 49 Goal status: INITIAL  2.  Pt will demonstrate at least 140 degrees of active shoulder elevation in order to demonstrate improved tolerance to functional movement patterns such as reaching overhead.  Baseline: see ROM chart above Goal status: INITIAL  3.  Pt will demonstrate at least 4+/5 shoulder flex/abd MMT for improved symmetry of UE strength and improved tolerance to functional movements.  Baseline: deferred on eval given proximity to surgery Goal status: INITIAL  4. Pt will reportability to perform upper body dressing with less than 2 point increase in pain on NPS in order to indicate improved tolerance/independence with ADLs.  Baseline: increased pain/difficulty with daily tasks  Goal status: INITIAL   5. Pt will report at least 50% decrease in overall pain levels in past week in order to facilitate improved tolerance to basic ADLs/mobility.   Baseline: 0-11/10  Goal  status: INITIAL    PLAN:  PT FREQUENCY: 2x/week  PT DURATION: 8 weeks  PLANNED INTERVENTIONS: 97164- PT Re-evaluation, 97110-Therapeutic exercises, 97530- Therapeutic activity, 97112- Neuromuscular re-education, 97535- Self Care, 09811- Manual therapy, 97014- Electrical stimulation (unattended), Patient/Family education, Balance training, Stair training, Taping, Dry Needling, Joint mobilization, Spinal mobilization, Scar mobilization, Cryotherapy, and Moist heat  PLAN FOR NEXT SESSION: Review/update HEP PRN. Work on PROM initially, working towards [employer] as appropriate/tolerated. Symptom modification strategies as indicated/appropriate. Reinforce post op precautions. Recommend considering initiation of deltoid isometrics in next couple visits pending pt tolerance. Phase 2 Delbert Harness protocol (4-6 weeks): avoid IR/extension, limit scaption/flexion <120 deg, ER <30-45 deg at 30 deg abd. Will gradually progress pt into phase 3 protocol as tolerated.  Anoushka Divito MS, PT 11/27/23 1:21 PM  PHYSICAL THERAPY DISCHARGE SUMMARY  Visits from Start of Care: 4  Current functional level related to goals / functional outcomes: See clinical impression and PT goals    Remaining deficits: See clinical impression and PT goals   Education / Equipment: HEP, Pt Ed   Patient agrees to discharge. Patient goals were partially met. Patient is being discharged due to  Pt's reports being told during her last appt with Dr. Dion Saucier she does not need to continue with PT. Marland Kitchen   Jennell Janosik MS, PT 12/09/23 10:47 AM

## 2023-11-26 NOTE — Telephone Encounter (Signed)
Spoke with pt per phone call re no show appt yesterday and to remember to call and cancel appts if not able to attend. Advised of attendance policy and of her upcoming appt tomorrow.

## 2023-11-27 ENCOUNTER — Ambulatory Visit: Payer: Medicaid Other

## 2023-11-27 DIAGNOSIS — M25512 Pain in left shoulder: Secondary | ICD-10-CM

## 2023-11-27 DIAGNOSIS — M25612 Stiffness of left shoulder, not elsewhere classified: Secondary | ICD-10-CM

## 2023-11-27 DIAGNOSIS — R293 Abnormal posture: Secondary | ICD-10-CM

## 2023-12-09 ENCOUNTER — Ambulatory Visit: Payer: Medicaid Other

## 2023-12-11 ENCOUNTER — Ambulatory Visit: Payer: Medicaid Other | Attending: Internal Medicine

## 2023-12-30 ENCOUNTER — Encounter: Payer: Medicaid Other | Admitting: Internal Medicine

## 2023-12-30 NOTE — Progress Notes (Deleted)
Office Visit Note  Patient: Tina Mayo             Date of Birth: [birth year]           MRN: [id number]             PCP: Fleet Contras, MD Referring: Zigmund Daniel.,* Visit Date: 12/30/2023 Occupation: @GUAROCC @  Subjective:  No chief complaint on file.   History of Present Illness: Tina Mayo is a 53 y.o. female ***     Activities of Daily Living:  Patient reports morning stiffness for *** {minute/hour:19697}.   Patient {ACTIONS;DENIES/REPORTS:21021675::"Denies"} nocturnal pain.  Difficulty dressing/grooming: {ACTIONS;DENIES/REPORTS:21021675::"Denies"} Difficulty climbing stairs: {ACTIONS;DENIES/REPORTS:21021675::"Denies"} Difficulty getting out of chair: {ACTIONS;DENIES/REPORTS:21021675::"Denies"} Difficulty using hands for taps, buttons, cutlery, and/or writing: {ACTIONS;DENIES/REPORTS:21021675::"Denies"}  No Rheumatology ROS completed.   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   Constipation due to slow transit 12/18/2018   Constipation    Choledocholithiasis 12/14/2018   RUQ abdominal pain    Trichomoniasis 05/03/2015   Pap smear for cervical cancer screening 05/02/2015   Perimenopausal 05/02/2015   Vaginal discharge 05/02/2015   Bell's palsy 02/28/2015   Insomnia 12/26/2013   Cystic breast 12/26/2013   Tobacco abuse 09/21/2013   Chest pain 08/26/2013   Thrombocytopenia (HCC) 08/26/2013   Cardiomegaly 08/26/2013   Sickle cell trait (HCC) 08/26/2013   Abnormal CT of the chest 08/26/2013   Depression 08/26/2013   Anemia, unspecified 08/26/2013    Fracture of ankle, closed 09/18/2010   Closed fracture of shaft of fibula with tibia 09/12/2010    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   Bell's palsy    Depression    Sickle cell trait (HCC)    Sleep apnea    no per pt    Family History  Problem Relation Age of Onset   Breast cancer Mother    Cancer Mother    Diabetes Mellitus II Father    Diabetes Father    Esophageal cancer Maternal Grandmother    Breast cancer Maternal Grandmother    Colon cancer Neg Hx    Colon polyps Neg Hx    Rectal cancer Neg Hx    Stomach cancer Neg Hx    Past Surgical History:  Procedure Laterality Date   benign breast tumor removed     2006   BREAST BIOPSY Right 2014   benign   BREAST EXCISIONAL BIOPSY Right 2007   benign    CHOLECYSTECTOMY N/A 12/16/2018   Procedure: LAPAROSCOPIC CHOLECYSTECTOMY;  Surgeon: Berna Bue, MD;  Location: [facility];  Service: General;  Laterality: N/A;   right ankle surgery     2010   TOTAL SHOULDER ARTHROPLASTY Right 06/29/2019   Procedure: TOTAL SHOULDER ARTHROPLASTY;  Surgeon: Teryl Lucy, MD;  Location: [facility];  Service: Orthopedics;  Laterality: Right;   TOTAL SHOULDER ARTHROPLASTY Left 09/30/2023   Procedure: TOTAL SHOULDER ARTHROPLASTY;  Surgeon: Teryl Lucy, MD;  Location: [facility];  Service: Orthopedics;  Laterality: Left;   TUBAL  LIGATION     Social History   Social History Narrative   Not on file   Immunization History  Administered Date(s) Administered   Tdap 12/16/2017     Objective: Vital Signs: LMP 01/12/2017    Physical Exam   Musculoskeletal Exam: ***  CDAI Exam: CDAI Score: -- Patient Global: --; Provider Global: -- Swollen: --; Tender: -- Joint Exam 12/30/2023   No joint exam has been documented for this visit   There is currently no information documented on the homunculus. Go to the Rheumatology activity and complete the  homunculus joint exam.  Investigation: No additional findings.  Imaging: No results found.  Recent Labs: Lab Results  Component Value Date   WBC 14.8 (H) 10/06/2023   HGB 7.9 (L) 10/06/2023   PLT 124 (L) 10/06/2023   NA 137 10/06/2023   K 3.5 10/06/2023   CL 104 10/06/2023   CO2 26 10/06/2023   GLUCOSE 104 (H) 10/06/2023   BUN 10 10/06/2023   CREATININE 0.58 10/06/2023   BILITOT 1.6 (H) 07/07/2023   ALKPHOS 103 07/07/2023   AST 34 07/07/2023   ALT 23 07/07/2023   PROT 6.8 07/07/2023   ALBUMIN 3.2 (L) 07/07/2023   CALCIUM 8.5 (L) 10/06/2023   GFRAA >60 07/04/2019    Speciality Comments: No specialty comments available.  Procedures:  No procedures performed Allergies: Levaquin [levofloxacin], Ca phosphate-cholecalciferol, [place] oil, and Penicillins   Assessment / Plan:     Visit Diagnoses: No diagnosis found.  Orders: No orders of the defined types were placed in this encounter.  No orders of the defined types were placed in this encounter.   Face-to-face time spent with patient was *** minutes. Greater than 50% of time was spent in counseling and coordination of care.  Follow-Up Instructions: No follow-ups on file.   Fuller Plan, MD  Note - This record has been created using [employer].  Chart creation errors have been sought, but may not always  have been located. Such creation errors do not reflect on  the standard of medical care.

## 2024-04-27 ENCOUNTER — Other Ambulatory Visit: Payer: Self-pay | Admitting: Internal Medicine

## 2024-04-27 DIAGNOSIS — Z1231 Encounter for screening mammogram for malignant neoplasm of breast: Secondary | ICD-10-CM

## 2024-07-15 ENCOUNTER — Encounter: Admitting: Obstetrics

## 2024-10-05 ENCOUNTER — Encounter: Payer: Self-pay | Admitting: Internal Medicine

## 2024-10-05 DIAGNOSIS — Z1231 Encounter for screening mammogram for malignant neoplasm of breast: Secondary | ICD-10-CM

## 2024-10-15 ENCOUNTER — Ambulatory Visit (INDEPENDENT_AMBULATORY_CARE_PROVIDER_SITE_OTHER)

## 2024-10-15 ENCOUNTER — Ambulatory Visit (HOSPITAL_COMMUNITY)
Admission: EM | Admit: 2024-10-15 | Discharge: 2024-10-15 | Disposition: A | Discharge status: Home / Self Care | Attending: Emergency Medicine | Admitting: Emergency Medicine

## 2024-10-15 ENCOUNTER — Encounter (HOSPITAL_COMMUNITY): Payer: Self-pay

## 2024-10-15 DIAGNOSIS — S09302A Unspecified injury of left middle and inner ear, initial encounter: Secondary | ICD-10-CM

## 2024-10-15 DIAGNOSIS — M25511 Pain in right shoulder: Secondary | ICD-10-CM

## 2024-10-15 MED ORDER — KETOROLAC TROMETHAMINE 30 MG/ML IJ SOLN
INTRAMUSCULAR | Status: AC
  Filled 2024-10-15: qty 1

## 2024-10-15 MED ORDER — KETOROLAC TROMETHAMINE 10 MG PO TABS: 10.0000 mg | ORAL_TABLET | Freq: Four times a day (QID) | ORAL | 0 refills | Status: AC | PRN

## 2024-10-15 MED ORDER — KETOROLAC TROMETHAMINE 30 MG/ML IJ SOLN
30.0000 mg | Freq: Once | INTRAMUSCULAR | Status: AC
  Administered 2024-10-15: 30 mg via INTRAMUSCULAR

## 2024-10-15 MED ORDER — NEOMYCIN-POLYMYXIN-HC 3.5-10000-1 OT SUSP: 3.0000 [drp] | Freq: Three times a day (TID) | OTIC | 0 refills | Status: AC

## 2024-10-15 NOTE — ED Provider Notes (Signed)
 [facility]    CSN: [id number] Arrival date & time: 10/15/24  9188      History   Chief Complaint Chief Complaint  Patient presents with   Shoulder Pain    HPI Tina Mayo is a 53 y.o. female.   Patient presents with right shoulder pain that began on 11/5.  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  Patient states that she had a little pain after this but woke up this morning and the pain was significantly worse.    Patient reports that she has a history of bilateral shoulder replacements, but is concerned that something is displaced in her shoulder due to the amount of pain that she is having.  Patient states that she also has difficulty raising her arm due to the pain.  Patient reports that she has been taking Aleve  with minimal relief.  Patient also reports with left ear discomfort.  Patient states that she was picking in her ear with her fingernail the other day and began to have some pain.  Patient states that she then used a Q-tip and noticed a small amount of blood on the Q-tip.  Patient denies any other drainage from her ear.  Patient also denies difficulty hearing.  The history is provided by the patient and medical records.  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)  06/29/2019   Constipation due to slow transit 12/18/2018   Constipation    Choledocholithiasis 12/14/2018   RUQ abdominal pain    Trichomoniasis 05/03/2015   Pap smear for cervical cancer screening 05/02/2015   Perimenopausal 05/02/2015   Vaginal discharge 05/02/2015   Bell's palsy 02/28/2015   Insomnia 12/26/2013   Cystic breast 12/26/2013   Tobacco abuse 09/21/2013   Chest pain 08/26/2013   Thrombocytopenia 08/26/2013   Cardiomegaly 08/26/2013   Sickle cell trait (HCC) 08/26/2013   Abnormal CT of the chest 08/26/2013   Depression 08/26/2013   Anemia, unspecified 08/26/2013   Fracture of ankle, closed 09/18/2010   Closed fracture of shaft of fibula with tibia 09/12/2010    Past Surgical History:  Procedure Laterality Date   benign breast tumor removed     2006   BREAST BIOPSY Right 2014   benign   BREAST EXCISIONAL BIOPSY Right 2007   benign    CHOLECYSTECTOMY N/A 12/16/2018   Procedure: LAPAROSCOPIC CHOLECYSTECTOMY;  Surgeon: Signe Mitzie LABOR, MD;  Location: [facility];  Service: General;  Laterality: N/A;   right ankle surgery     2010   TOTAL SHOULDER ARTHROPLASTY Right 06/29/2019   Procedure: TOTAL SHOULDER ARTHROPLASTY;  Surgeon: Josefina Chew, MD;  Location: [facility];  Service: Orthopedics;  Laterality: Right;  TOTAL SHOULDER ARTHROPLASTY Left 09/30/2023   Procedure: TOTAL SHOULDER ARTHROPLASTY;  Surgeon: Josefina Chew, MD;  Location: [facility];  Service: Orthopedics;  Laterality: Left;   TUBAL LIGATION      OB History   No obstetric history on file.      Home Medications    Prior to Admission medications   Medication Sig Start Date End Date Taking? Authorizing Provider  ketorolac  (TORADOL ) 10 MG tablet Take 1 tablet (10 mg total) by mouth every 6 (six) hours as needed for moderate pain (pain score 4-6) or severe pain (pain score 7-10). 10/15/24  Yes Johnie, Francyne Arreaga A, NP  neomycin-polymyxin-hydrocortisone  (CORTISPORIN) 3.5-10000-1 OTIC suspension Place 3 drops  into the left ear 3 (three) times daily. 10/15/24  Yes Johnie, Honest Safranek A, NP  acetaminophen  (TYLENOL ) 325 MG tablet Take 2 tablets (650 mg total) by mouth every 6 (six) hours as needed for mild pain, fever or headache. 07/10/23   Danton Reyes DASEN, MD  ARIPiprazole  (ABILIFY ) 20 MG tablet Take 20 mg by mouth daily.    [provider]  aspirin  EC 81 MG tablet Take 1 tablet (81 mg total) by mouth 2 (two) times daily. For DVT prophylaxis for 30 days after surgery. 10/06/23   Brown, Blaine K, PA-C  cyanocobalamin  (VITAMIN B12) 1000 MCG tablet Take 1 tablet (1,000 mcg total) by mouth daily. 07/10/23   Danton Reyes DASEN, MD  ergocalciferol  (VITAMIN D2) 1.25 MG (50000 UT) capsule Take 50,000 Units by mouth once a week.    [provider]  ferrous sulfate  (FEROSUL) 325 (65 FE) MG tablet Take 1 tablet (325 mg total) by mouth daily with breakfast. 07/11/23   Danton Reyes DASEN, MD  folic acid  (FOLVITE ) 1 MG tablet Take 1 tablet (1 mg total) by mouth daily. 07/11/23   Danton Reyes DASEN, MD  methocarbamol  (ROBAXIN ) 500 MG tablet Take 1 tablet (500 mg total) by mouth every 8 (eight) hours as needed for muscle spasms. 10/06/23   Brown, Blaine K, PA-C  ondansetron  (ZOFRAN ) 4 MG tablet Take 1 tablet (4 mg total) by mouth every 8 (eight) hours as needed for nausea or vomiting. 10/06/23   Brown, Blaine K, PA-C  oxyCODONE  (ROXICODONE ) 5 MG immediate release tablet Take 1-2 tablets (5-10 mg total) by mouth every 4 (four) hours as needed for severe pain (pain score 7-10). 10/06/23   Brown, Blaine K, PA-C  sennosides-docusate sodium  (SENOKOT-S) 8.6-50 MG tablet Take 2 tablets by mouth daily. 10/06/23   Brown, Blaine K, PA-C  sertraline  (ZOLOFT ) 100 MG tablet Take 100 mg by mouth at bedtime.    [provider]  zolpidem  (AMBIEN  CR) 12.5 MG CR tablet Take 12.5 mg by mouth at bedtime as needed for sleep. 08/22/23   [provider]    Family History Family History  Problem Relation Age of Onset    Breast cancer Mother    Cancer Mother    Diabetes Mellitus II Father    Diabetes Father    Esophageal cancer Maternal Grandmother    Breast cancer Maternal Grandmother    Colon cancer Neg Hx    Colon polyps Neg Hx    Rectal cancer Neg Hx    Stomach cancer Neg Hx     Social History Social History   Tobacco Use   Smoking status: Every Day    Current packs/day: 0.25    Types: Cigarettes   Smokeless tobacco: Current  Vaping Use   Vaping status: Former  Substance Use Topics   Alcohol   use: Yes    Comment: occ   Drug use: No     Allergies   Levaquin  [levofloxacin ], Ca phosphate-cholecalciferol, [place]um oil, and Penicillins   Review of Systems Review of Systems  Per HPI  Physical Exam Triage Vital Signs ED Triage Vitals  Encounter Vitals Group     BP 10/15/24 0903 107/67     Girls Systolic BP Percentile --      Girls Diastolic BP Percentile --      Boys Systolic BP Percentile --      Boys Diastolic BP Percentile --      Pulse Rate 10/15/24 0903 78     Resp 10/15/24 0903 16     Temp 10/15/24 0903 98.1 F (36.7 C)     Temp Source 10/15/24 0903 Oral     SpO2 10/15/24 0903 98 %     Weight --      Height --      Head Circumference --      Peak Flow --      Pain Score 10/15/24 0902 9     Pain Loc --      Pain Education --      Exclude from Growth Chart --    No data found.  Updated Vital Signs BP 107/67 (BP Location: Left Arm)   Pulse 78   Temp 98.1 F (36.7 C) (Oral)   Resp 16   LMP 01/12/2017   SpO2 98%   Visual Acuity Right Eye Distance:   Left Eye Distance:   Bilateral Distance:    Right Eye Near:   Left Eye Near:    Bilateral Near:     Physical Exam Vitals and nursing note reviewed.  Constitutional:      General: She is awake. She is not in acute distress.    Appearance: Normal appearance. She is well-developed and well-groomed. She is not ill-appearing.  HENT:     Right Ear: Tympanic membrane, ear canal and external ear normal.      Left Ear: Tympanic membrane and external ear normal. Laceration and tenderness present. No drainage or swelling.     Ears:     Comments: Small scabbed over abrasion noted just inside the left in her ear canal Musculoskeletal:     Right shoulder: Swelling and tenderness present. No deformity or laceration. Decreased range of motion.     Comments: Tenderness noted over superior aspect of the right trapezius muscle  Skin:    General: Skin is warm and dry.  Neurological:     Mental Status: She is alert.  Psychiatric:        Behavior: Behavior is cooperative.      UC Treatments / Results  Labs (all labs ordered are listed, but only abnormal results are displayed) Labs Reviewed - No data to display  EKG   Radiology DG Shoulder Right Result Date: 10/15/2024 CLINICAL DATA:  3 day history of right shoulder pain EXAM: RIGHT SHOULDER - 4 VIEW COMPARISON:  None Available. FINDINGS: Right shoulder arthroplasty hardware appears intact. The prosthetic humeral head appears slightly superiorly subluxated relative to the glenoid. There is no evidence of fracture. Mild-to-moderate degenerative changes of the glenoid. Soft tissues are unremarkable. IMPRESSION: 1. Right shoulder arthroplasty hardware appears intact. The prosthetic humeral head appears slightly superiorly subluxated relative to the glenoid, which may reflect supraspinatus tendinopathy. 2. Mild-to-moderate degenerative changes of the glenoid. Electronically Signed   By: Limin  Xu M.D.   On: 10/15/2024 10:09    Procedures Procedures (including  critical care time)  Medications Ordered in UC Medications  ketorolac  (TORADOL ) 30 MG/ML injection 30 mg (30 mg Intramuscular Given 10/15/24 1030)    Initial Impression / Assessment and Plan / UC Course  I have reviewed the triage vital signs and the nursing notes.  Pertinent labs & imaging results that were available during my care of the patient were reviewed by me and considered in my medical  decision making (see chart for details).     Patient is overall well-appearing.  Vitals are stable.  Shoulder x-ray ordered.  Radiology report reveals that hardware is intact but there is some evidence of supraspinatus tendinopathy.  I independently interpreted these images and agree with these findings.  Given IM Toradol  in clinic for acute pain.  Prescribed additional Toradol  as needed for pain.  Recommended following with orthopedics for further evaluation and management if pain continues.  Prescribed Cortisporin eardrops for infection coverage related to inner ear canal injury.  Discussed follow-up and return precautions.  Final Clinical Impressions(s) / UC Diagnoses   Final diagnoses:  Acute pain of right shoulder  Injury of left inner ear     Discharge Instructions      X-ray does not reveal any fractures or issues with your hardware from previous surgery.  There are some signs of some tendinopathy on x-ray. You were given an injection of Toradol  in clinic today to help with your pain. We at least 8 hours before taking any additional Toradol .  After this you can take Toradol  every 6 hours as needed for pain.  Do not take this with other NSAIDs including ibuprofen , Motrin , Advil , naproxen , or Aleve . You can take 500 to 1000 mg of Tylenol  as needed for pain. Alternate between ice and heat and do some gentle stretching to help with pain. You can follow-up with Dr. Josefina who is your orthopedic doctor that you had seen previously for your shoulder replacement for further evaluation if your pain continues.  I prescribed Cortisporin eardrops that you can apply 3 times daily to your left ear over the next 5 days to help prevent infection related to injury inside the ear. Otherwise follow-up with your primary care provider or return here as needed.     ED Prescriptions     Medication Sig Dispense Auth. Provider   neomycin-polymyxin-hydrocortisone  (CORTISPORIN) 3.5-10000-1 OTIC  suspension Place 3 drops into the left ear 3 (three) times daily. 10 mL Johnie Flaming A, NP   ketorolac  (TORADOL ) 10 MG tablet Take 1 tablet (10 mg total) by mouth every 6 (six) hours as needed for moderate pain (pain score 4-6) or severe pain (pain score 7-10). 20 tablet Johnie Flaming A, NP      PDMP not reviewed this encounter.   Johnie Flaming A, NP 10/15/24 1035

## 2024-10-15 NOTE — Discharge Instructions (Addendum)
 X-ray does not reveal any fractures or issues with your hardware from previous surgery.  There are some signs of some tendinopathy on x-ray. You were given an injection of Toradol  in clinic today to help with your pain. We at least 8 hours before taking any additional Toradol .  After this you can take Toradol  every 6 hours as needed for pain.  Do not take this with other NSAIDs including ibuprofen , Motrin , Advil , naproxen , or Aleve . You can take 500 to 1000 mg of Tylenol  as needed for pain. Alternate between ice and heat and do some gentle stretching to help with pain. You can follow-up with Dr. Josefina who is your orthopedic doctor that you had seen previously for your shoulder replacement for further evaluation if your pain continues.  I prescribed Cortisporin eardrops that you can apply 3 times daily to your left ear over the next 5 days to help prevent infection related to injury inside the ear. Otherwise follow-up with your primary care provider or return here as needed.

## 2024-10-15 NOTE — ED Triage Notes (Addendum)
 Pt states right shoulder pain for the past 3 days.  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  States she took alleve this morning for the pain. Also states she would like to have her left ear looked at.

## 2024-11-08 ENCOUNTER — Ambulatory Visit

## 2024-12-07 ENCOUNTER — Ambulatory Visit

## 2025-01-14 ENCOUNTER — Ambulatory Visit

## 2025-02-01 ENCOUNTER — Ambulatory Visit
